# Patient Record
Sex: Female | Born: 1950 | Race: White | Hispanic: No | Marital: Married | Smoking: Never smoker
Health system: Southern US, Community
[De-identification: ages and names within clinical notes are randomized; demographics above are authoritative.]

## PROBLEM LIST (undated history)

## (undated) DIAGNOSIS — K52832 Lymphocytic colitis: Secondary | ICD-10-CM

## (undated) DIAGNOSIS — J45909 Unspecified asthma, uncomplicated: Secondary | ICD-10-CM

## (undated) DIAGNOSIS — R519 Headache, unspecified: Secondary | ICD-10-CM

## (undated) DIAGNOSIS — K589 Irritable bowel syndrome without diarrhea: Secondary | ICD-10-CM

## (undated) DIAGNOSIS — D649 Anemia, unspecified: Secondary | ICD-10-CM

## (undated) DIAGNOSIS — I480 Paroxysmal atrial fibrillation: Secondary | ICD-10-CM

## (undated) DIAGNOSIS — S8991XA Unspecified injury of right lower leg, initial encounter: Secondary | ICD-10-CM

## (undated) DIAGNOSIS — R112 Nausea with vomiting, unspecified: Secondary | ICD-10-CM

## (undated) DIAGNOSIS — J302 Other seasonal allergic rhinitis: Secondary | ICD-10-CM

## (undated) DIAGNOSIS — R51 Headache: Secondary | ICD-10-CM

## (undated) DIAGNOSIS — H269 Unspecified cataract: Secondary | ICD-10-CM

## (undated) DIAGNOSIS — M797 Fibromyalgia: Secondary | ICD-10-CM

## (undated) DIAGNOSIS — Z87442 Personal history of urinary calculi: Secondary | ICD-10-CM

## (undated) DIAGNOSIS — M1990 Unspecified osteoarthritis, unspecified site: Secondary | ICD-10-CM

## (undated) DIAGNOSIS — I2510 Atherosclerotic heart disease of native coronary artery without angina pectoris: Secondary | ICD-10-CM

## (undated) DIAGNOSIS — G8929 Other chronic pain: Secondary | ICD-10-CM

## (undated) DIAGNOSIS — K581 Irritable bowel syndrome with constipation: Secondary | ICD-10-CM

## (undated) DIAGNOSIS — T4145XA Adverse effect of unspecified anesthetic, initial encounter: Secondary | ICD-10-CM

## (undated) DIAGNOSIS — IMO0002 Reserved for concepts with insufficient information to code with codable children: Secondary | ICD-10-CM

## (undated) DIAGNOSIS — T8859XA Other complications of anesthesia, initial encounter: Secondary | ICD-10-CM

## (undated) DIAGNOSIS — R079 Chest pain, unspecified: Secondary | ICD-10-CM

## (undated) DIAGNOSIS — I499 Cardiac arrhythmia, unspecified: Secondary | ICD-10-CM

## (undated) DIAGNOSIS — K5790 Diverticulosis of intestine, part unspecified, without perforation or abscess without bleeding: Secondary | ICD-10-CM

## (undated) DIAGNOSIS — E785 Hyperlipidemia, unspecified: Secondary | ICD-10-CM

## (undated) DIAGNOSIS — I609 Nontraumatic subarachnoid hemorrhage, unspecified: Secondary | ICD-10-CM

## (undated) DIAGNOSIS — L309 Dermatitis, unspecified: Secondary | ICD-10-CM

## (undated) DIAGNOSIS — T7840XA Allergy, unspecified, initial encounter: Secondary | ICD-10-CM

## (undated) DIAGNOSIS — I728 Aneurysm of other specified arteries: Secondary | ICD-10-CM

## (undated) DIAGNOSIS — N83209 Unspecified ovarian cyst, unspecified side: Secondary | ICD-10-CM

## (undated) DIAGNOSIS — N189 Chronic kidney disease, unspecified: Secondary | ICD-10-CM

## (undated) DIAGNOSIS — Z9889 Other specified postprocedural states: Secondary | ICD-10-CM

## (undated) HISTORY — PX: BLADDER SUSPENSION: SHX72

## (undated) HISTORY — DX: Reserved for concepts with insufficient information to code with codable children: IMO0002

## (undated) HISTORY — DX: Headache: R51

## (undated) HISTORY — DX: Chest pain, unspecified: R07.9

## (undated) HISTORY — PX: OTHER SURGICAL HISTORY: SHX169

## (undated) HISTORY — PX: KNEE SURGERY: SHX244

## (undated) HISTORY — DX: Irritable bowel syndrome, unspecified: K58.9

## (undated) HISTORY — DX: Unspecified asthma, uncomplicated: J45.909

## (undated) HISTORY — DX: Unspecified osteoarthritis, unspecified site: M19.90

## (undated) HISTORY — DX: Lymphocytic colitis: K52.832

## (undated) HISTORY — PX: VAGINAL PROLAPSE REPAIR: SHX830

## (undated) HISTORY — DX: Fibromyalgia: M79.7

## (undated) HISTORY — DX: Headache, unspecified: R51.9

## (undated) HISTORY — DX: Anemia, unspecified: D64.9

## (undated) HISTORY — DX: Other seasonal allergic rhinitis: J30.2

## (undated) HISTORY — DX: Allergy, unspecified, initial encounter: T78.40XA

## (undated) HISTORY — DX: Unspecified cataract: H26.9

## (undated) HISTORY — DX: Unspecified ovarian cyst, unspecified side: N83.209

## (undated) HISTORY — DX: Paroxysmal atrial fibrillation: I48.0

## (undated) HISTORY — DX: Chronic kidney disease, unspecified: N18.9

## (undated) HISTORY — DX: Dermatitis, unspecified: L30.9

## (undated) HISTORY — PX: DILATION AND CURETTAGE OF UTERUS: SHX78

## (undated) HISTORY — DX: Other chronic pain: G89.29

## (undated) HISTORY — DX: Hyperlipidemia, unspecified: E78.5

## (undated) HISTORY — DX: Irritable bowel syndrome with constipation: K58.1

## (undated) HISTORY — PX: CATARACT EXTRACTION: SUR2

## (undated) HISTORY — PX: TOTAL ABDOMINAL HYSTERECTOMY: SHX209

## (undated) HISTORY — DX: Diverticulosis of intestine, part unspecified, without perforation or abscess without bleeding: K57.90

---

## 1998-12-23 ENCOUNTER — Other Ambulatory Visit: Admission: RE | Admit: 1998-12-23 | Discharge: 1998-12-23 | Payer: Self-pay | Admitting: Obstetrics and Gynecology

## 1999-03-13 ENCOUNTER — Encounter: Payer: Self-pay | Admitting: Internal Medicine

## 1999-03-13 ENCOUNTER — Ambulatory Visit (HOSPITAL_COMMUNITY): Admission: RE | Admit: 1999-03-13 | Discharge: 1999-03-13 | Payer: Self-pay | Admitting: Internal Medicine

## 2000-02-05 ENCOUNTER — Other Ambulatory Visit: Admission: RE | Admit: 2000-02-05 | Discharge: 2000-02-05 | Payer: Self-pay | Admitting: Obstetrics and Gynecology

## 2001-02-05 ENCOUNTER — Other Ambulatory Visit: Admission: RE | Admit: 2001-02-05 | Discharge: 2001-02-05 | Payer: Self-pay | Admitting: Obstetrics and Gynecology

## 2001-08-11 ENCOUNTER — Encounter: Admission: RE | Admit: 2001-08-11 | Discharge: 2001-08-11 | Payer: Self-pay | Admitting: Urology

## 2001-08-11 ENCOUNTER — Encounter: Payer: Self-pay | Admitting: Urology

## 2001-09-29 ENCOUNTER — Encounter (INDEPENDENT_AMBULATORY_CARE_PROVIDER_SITE_OTHER): Payer: Self-pay | Admitting: Specialist

## 2001-09-29 ENCOUNTER — Observation Stay (HOSPITAL_COMMUNITY): Admission: RE | Admit: 2001-09-29 | Discharge: 2001-09-30 | Payer: Self-pay | Admitting: Obstetrics and Gynecology

## 2001-12-04 ENCOUNTER — Encounter: Payer: Self-pay | Admitting: Urology

## 2001-12-04 ENCOUNTER — Encounter: Admission: RE | Admit: 2001-12-04 | Discharge: 2001-12-04 | Payer: Self-pay | Admitting: Urology

## 2002-02-10 ENCOUNTER — Other Ambulatory Visit: Admission: RE | Admit: 2002-02-10 | Discharge: 2002-02-10 | Payer: Self-pay | Admitting: Obstetrics and Gynecology

## 2003-02-15 ENCOUNTER — Other Ambulatory Visit: Admission: RE | Admit: 2003-02-15 | Discharge: 2003-02-15 | Payer: Self-pay | Admitting: Obstetrics and Gynecology

## 2003-08-24 ENCOUNTER — Encounter: Admission: RE | Admit: 2003-08-24 | Discharge: 2003-08-24 | Payer: Self-pay | Admitting: Sports Medicine

## 2003-08-24 ENCOUNTER — Encounter: Payer: Self-pay | Admitting: Sports Medicine

## 2004-02-21 ENCOUNTER — Other Ambulatory Visit: Admission: RE | Admit: 2004-02-21 | Discharge: 2004-02-21 | Payer: Self-pay | Admitting: Obstetrics and Gynecology

## 2005-02-23 ENCOUNTER — Other Ambulatory Visit: Admission: RE | Admit: 2005-02-23 | Discharge: 2005-02-23 | Payer: Self-pay | Admitting: Obstetrics and Gynecology

## 2005-04-10 ENCOUNTER — Ambulatory Visit: Payer: Self-pay | Admitting: Internal Medicine

## 2005-04-11 ENCOUNTER — Ambulatory Visit: Payer: Self-pay | Admitting: Internal Medicine

## 2005-05-13 ENCOUNTER — Emergency Department (HOSPITAL_COMMUNITY): Admission: AD | Admit: 2005-05-13 | Discharge: 2005-05-13 | Payer: Self-pay | Admitting: Family Medicine

## 2005-09-19 ENCOUNTER — Ambulatory Visit (HOSPITAL_BASED_OUTPATIENT_CLINIC_OR_DEPARTMENT_OTHER): Admission: RE | Admit: 2005-09-19 | Discharge: 2005-09-19 | Payer: Self-pay | Admitting: Urology

## 2005-09-19 ENCOUNTER — Encounter (INDEPENDENT_AMBULATORY_CARE_PROVIDER_SITE_OTHER): Payer: Self-pay | Admitting: Specialist

## 2005-09-19 ENCOUNTER — Ambulatory Visit (HOSPITAL_COMMUNITY): Admission: RE | Admit: 2005-09-19 | Discharge: 2005-09-19 | Payer: Self-pay | Admitting: Urology

## 2005-10-14 ENCOUNTER — Emergency Department (HOSPITAL_COMMUNITY): Admission: AD | Admit: 2005-10-14 | Discharge: 2005-10-14 | Payer: Self-pay | Admitting: Family Medicine

## 2009-01-06 ENCOUNTER — Encounter: Admission: RE | Admit: 2009-01-06 | Discharge: 2009-01-06 | Payer: Self-pay | Admitting: Sports Medicine

## 2009-01-26 ENCOUNTER — Encounter: Admission: RE | Admit: 2009-01-26 | Discharge: 2009-01-26 | Payer: Self-pay | Admitting: Sports Medicine

## 2009-11-15 ENCOUNTER — Encounter: Admission: RE | Admit: 2009-11-15 | Discharge: 2009-11-15 | Payer: Self-pay | Admitting: Neurosurgery

## 2009-11-19 HISTORY — PX: OTHER SURGICAL HISTORY: SHX169

## 2009-11-29 ENCOUNTER — Encounter: Admission: RE | Admit: 2009-11-29 | Discharge: 2009-11-29 | Payer: Self-pay | Admitting: Neurosurgery

## 2009-12-02 ENCOUNTER — Ambulatory Visit: Payer: Self-pay | Admitting: Vascular Surgery

## 2009-12-16 ENCOUNTER — Ambulatory Visit: Payer: Self-pay | Admitting: Vascular Surgery

## 2010-01-09 ENCOUNTER — Encounter: Payer: Self-pay | Admitting: Vascular Surgery

## 2010-01-09 ENCOUNTER — Ambulatory Visit: Payer: Self-pay | Admitting: Vascular Surgery

## 2010-01-09 ENCOUNTER — Inpatient Hospital Stay (HOSPITAL_COMMUNITY): Admission: RE | Admit: 2010-01-09 | Discharge: 2010-01-13 | Payer: Self-pay | Admitting: Vascular Surgery

## 2010-01-13 ENCOUNTER — Ambulatory Visit: Payer: Self-pay | Admitting: Vascular Surgery

## 2010-02-03 ENCOUNTER — Ambulatory Visit: Payer: Self-pay | Admitting: Vascular Surgery

## 2010-02-27 ENCOUNTER — Encounter (INDEPENDENT_AMBULATORY_CARE_PROVIDER_SITE_OTHER): Payer: Self-pay | Admitting: *Deleted

## 2010-03-27 ENCOUNTER — Ambulatory Visit: Payer: Self-pay | Admitting: Vascular Surgery

## 2010-04-14 ENCOUNTER — Encounter: Payer: Self-pay | Admitting: Internal Medicine

## 2010-04-14 ENCOUNTER — Ambulatory Visit: Payer: Self-pay | Admitting: Vascular Surgery

## 2010-08-03 ENCOUNTER — Encounter (INDEPENDENT_AMBULATORY_CARE_PROVIDER_SITE_OTHER): Payer: Self-pay | Admitting: *Deleted

## 2010-08-07 ENCOUNTER — Ambulatory Visit: Payer: Self-pay | Admitting: Internal Medicine

## 2010-10-18 ENCOUNTER — Ambulatory Visit: Payer: Self-pay | Admitting: Internal Medicine

## 2010-12-19 NOTE — Miscellaneous (Signed)
Summary: [facility] PV/prep  Clinical Lists Changes  Medications: Added new medication of MIRALAX   POWD (POLYETHYLENE GLYCOL 3350) As per prep  instructions. - Signed Added new medication of DULCOLAX 5 MG  TBEC (BISACODYL) Day before procedure take 2 at 3pm and 2 at 8pm. - Signed Added new medication of REGLAN 10 MG  TABS (METOCLOPRAMIDE HCL) As per prep instructions. - Signed Rx of MIRALAX   POWD (POLYETHYLENE GLYCOL 3350) As per prep  instructions.;  #255gm x 0;  Signed;  Entered by: Doristine Church RN II;  Authorized by: Hart Carwin MD;  Method used: Electronically to [facility] [address] [place], [place]  [address], Ph: [phone], Fax: [phone] Rx of DULCOLAX 5 MG  TBEC (BISACODYL) Day before procedure take 2 at 3pm and 2 at 8pm.;  #4 x 0;  Signed;  Entered by: Doristine Church RN II;  Authorized by: Hart Carwin MD;  Method used: Electronically to [facility]*, [address], [place], [place]  [address], Ph: [phone], Fax: [phone] Rx of REGLAN 10 MG  TABS (METOCLOPRAMIDE HCL) As per prep instructions.;  #2 x 0;  Signed;  Entered by: Doristine Church RN II;  Authorized by: Hart Carwin MD;  Method used: Electronically to [facility]*, [address] [place], [place]  [address], Ph: [phone], Fax: [phone] Allergies: Added new allergy or adverse reaction of PCN Added new allergy or adverse reaction of CODEINE Added new allergy or adverse reaction of MORPHINE Added new allergy or adverse reaction of TALWIN Observations: Added new observation of ALLERGY REV: Done (08/07/2010 14:21) Added new observation of NKA: F (08/07/2010 14:21)    Prescriptions: REGLAN 10 MG  TABS (METOCLOPRAMIDE HCL) As per prep instructions.  #2 x 0   Entered by:   Doristine Church RN II   Authorized by:   Hart Carwin MD   Signed by:   Doristine Church RN II on 08/07/2010   Method used:   Electronically to        [facility]*  (retail)       [address]       [place], [place]  [address]       Ph: [phone]       Fax: [phone]   RxID:   [id number] DULCOLAX 5 MG  TBEC (BISACODYL) Day before procedure take 2 at 3pm and 2 at 8pm.  #4 x 0   Entered by:   Doristine Church RN II   Authorized by:   Hart Carwin MD   Signed by:   Doristine Church RN II on 08/07/2010   Method used:   Electronically to        [facility]* (retail)       [address]       [place], [place]  [address]       Ph: [phone]       Fax: [phone]   RxID:   [id number] MIRALAX   POWD (POLYETHYLENE GLYCOL 3350) As per prep  instructions.  #255gm x 0   Entered by:   Doristine Church RN II   Authorized by:   Hart Carwin MD   Signed by:   Doristine Church RN II on 08/07/2010   Method used:   Electronically to        [facility]* (retail)       [address]       [place], [place]  [address]  Ph: [phone]       Fax: [phone]   RxID:   [id number]

## 2010-12-19 NOTE — Procedures (Signed)
Summary: Colonoscopy  Patient: Kara Ramirez Note: All result statuses are Final unless otherwise noted.  Tests: (1) Colonoscopy (COL)   COL Colonoscopy           DONE     [facility]     [address]     [place], [place]  [address]           COLONOSCOPY PROCEDURE REPORT           PATIENT:  Kara Ramirez, Kara Ramirez  MR#:  [id number]     BIRTHDATE:  Apr 29, 1951, 59 yrs. old  GENDER:  female     ENDOSCOPIST:  Hedwig Morton. Juanda Chance, MD     REF. BY:  Herb Grays, M.D.     PROCEDURE DATE:  10/18/2010     PROCEDURE:  Colonoscopy 19147     ASA CLASS:  Class I     INDICATIONS:  family history of colon cancer     MEDICATIONS:   Versed 8 mg, Fentanyl 75 mcg           DESCRIPTION OF PROCEDURE:   After the risks benefits and     alternatives of the procedure were thoroughly explained, informed     consent was obtained.  Digital rectal exam was performed and     revealed no rectal masses.   The LB [id number]-[id number] endoscope     was introduced through the anus and advanced to the cecum, which     was identified by both the appendix and ileocecal valve, without     limitations.  The quality of the prep was good, using MiraLax.     The instrument was then slowly withdrawn as the colon was fully     examined.     <<PROCEDUREIMAGES>>           FINDINGS:  No polyps or cancers were seen (see image1, image2,     image3, image4, image5, image6, and image7).   Retroflexed views     in the rectum revealed no abnormalities.    The scope was then     withdrawn from the patient and the procedure completed.           COMPLICATIONS:  None     ENDOSCOPIC IMPRESSION:     1) No polyps or cancers     2) Normal colonoscopy     RECOMMENDATIONS:     1) high fiber diet     REPEAT EXAM:  In 5 year(s) for.           ______________________________     Hedwig Morton. Juanda Chance, MD           CC:           n.     eSIGNED:   Hedwig Morton. Kashayla Ungerer at 10/18/2010 10:16 AM           Mariah Milling, [phone]  Note: An exclamation  mark (!) indicates a result that was not dispersed into the flowsheet. Document Creation Date: 10/18/2010 10:16 AM _______________________________________________________________________  (1) Order result status: Final Collection or observation date-time: 10/18/2010 10:08 Requested date-time:  Receipt date-time:  Reported date-time:  Referring Physician:   Ordering Physician: Lina Sar [phone]) Specimen Source:  Source: Launa Grill Order Number: [id number] Lab site:   Appended Document: Colonoscopy    Clinical Lists Changes  Observations: Added new observation of COLONNXTDUE: 09/2015 (10/18/2010 12:39)

## 2010-12-19 NOTE — Letter (Signed)
Summary: [facility]   Imported By: Lester Staunton 05/04/2010 10:39:17  _____________________________________________________________________  External Attachment:    Type:   Image     Comment:   External Document

## 2010-12-19 NOTE — Letter (Signed)
Summary: [facility] Instructions  [facility]  [address] [place], [place] [address]   Phone: [phone]  Fax: [phone]       Kara Ramirez    [birth year]    MRN: [id number]       Procedure Day /Date:  08/24/10  Thursday     Arrival Time:  8:30am     Procedure Time:  9:30am     Location of Procedure:                    _x _  [facility] (4th Floor)    PREPARATION FOR COLONOSCOPY WITH MIRALAX  Starting 5 days prior to your procedure _10/1/11 _ do not eat nuts, seeds, popcorn, corn, beans, peas,  salads, or any raw vegetables.  Do not take any fiber supplements (e.g. Metamucil, Citrucel, and Benefiber). ____________________________________________________________________________________________________   THE DAY BEFORE YOUR PROCEDURE         DATE: 08/23/10   Wednesday  1   Drink clear liquids the entire day-NO SOLID FOOD  2   Do not drink anything colored red or purple.  Avoid juices with pulp.  No orange juice.  3   Drink at least 64 oz. (8 glasses) of fluid/clear liquids during the day to prevent dehydration and help the prep work efficiently.  CLEAR LIQUIDS INCLUDE: Water Jello Ice Popsicles Tea (sugar ok, no milk/cream) Powdered fruit flavored drinks Coffee (sugar ok, no milk/cream) Gatorade Juice: apple, white grape, white cranberry  Lemonade Clear bullion, consomm, broth Carbonated beverages (any kind) Strained chicken noodle soup Hard Candy  4   Mix the entire bottle of Miralax with 64 oz. of Gatorade/Powerade in the morning and put in the refrigerator to chill.  5   At 3:00 pm take 2 Dulcolax/Bisacodyl tablets.  6   At 4:30 pm take one Reglan/Metoclopramide tablet.  7  Starting at 5:00 pm drink one 8 oz glass of the Miralax mixture every 15-20 minutes until you have finished drinking the entire 64 oz.  You should finish drinking prep around 7:30 or 8:00 pm.  8   If you are nauseated, you may take the 2nd Reglan/Metoclopramide  tablet at 6:30 pm.        9    At 8:00 pm take 2 more DULCOLAX/Bisacodyl tablets.     THE DAY OF YOUR PROCEDURE      DATE:    08/24/10  DAY:  Thursday  You may drink clear liquids until  7:30am  (2 HOURS BEFORE PROCEDURE).   MEDICATION INSTRUCTIONS  Unless otherwise instructed, you should take regular prescription medications with a small sip of water as early as possible the morning of your procedure.       OTHER INSTRUCTIONS  You will need a responsible adult at least 61 years of age to accompany you and drive you home.   This person must remain in the waiting room during your procedure.  Wear loose fitting clothing that is easily removed.  Leave jewelry and other valuables at home.  However, you may wish to bring a book to read or an iPod/MP3 player to listen to music as you wait for your procedure to start.  Remove all body piercing jewelry and leave at home.  Total time from sign-in until discharge is approximately 2-3 hours.  You should go home directly after your procedure and rest.  You can resume normal activities the day after your procedure.  The day of your procedure you should not:  Drive   Make legal decisions   Operate machinery   Drink alcohol   Return to work  You will receive specific instructions about eating, activities and medications before you leave.   The above instructions have been reviewed and explained to me by   Doristine Church RN II  August 07, 2010 3:20 PM _______________________    I fully understand and can verbalize these instructions _____________________________ Date _______

## 2010-12-19 NOTE — Letter (Signed)
Summary: Colonoscopy Letter  [facility]  [address] [place], [place] [address]   Phone: [phone]  Fax: [phone]      February 27, 2010 MRN: [id number]   Kara Ramirez [address] [place], [place]  [address]   Dear Kara Ramirez,   According to your medical record, it is time for you to schedule a Colonoscopy. The American Cancer Society recommends this procedure as a method to detect early colon cancer. Patients with a family history of colon cancer, or a personal history of colon polyps or inflammatory bowel disease are at increased risk.  This letter has beeen generated based on the recommendations made at the time of your procedure. If you feel that in your particular situation this may no longer apply, please contact our office.  Please call our office at [phone] to schedule this appointment or to update your records at your earliest convenience.  Thank you for cooperating with [place] to provide you with the very best care possible.   Sincerely,  Hedwig Morton. Juanda Chance, M.D.  [facility] [phone]

## 2011-02-08 LAB — BLOOD GAS, ARTERIAL
Acid-Base Excess: 1.5 mmol/L (ref 0.0–2.0)
Bicarbonate: 24.8 mEq/L — ABNORMAL HIGH (ref 20.0–24.0)
Drawn by: 181601
O2 Content: 0.2 L/min
O2 Saturation: 98.6 %
Patient temperature: 98.6
TCO2: 25.8 mmol/L (ref 0–100)
pCO2 arterial: 33.7 mmHg — ABNORMAL LOW (ref 35.0–45.0)
pH, Arterial: 7.479 — ABNORMAL HIGH (ref 7.350–7.400)
pO2, Arterial: 111 mmHg — ABNORMAL HIGH (ref 80.0–100.0)

## 2011-02-08 LAB — CBC
HCT: 30 % — ABNORMAL LOW (ref 36.0–46.0)
HCT: 30.2 % — ABNORMAL LOW (ref 36.0–46.0)
HCT: 31.2 % — ABNORMAL LOW (ref 36.0–46.0)
HCT: 32.7 % — ABNORMAL LOW (ref 36.0–46.0)
HCT: 38.9 % (ref 36.0–46.0)
Hemoglobin: 10.1 g/dL — ABNORMAL LOW (ref 12.0–15.0)
Hemoglobin: 10.2 g/dL — ABNORMAL LOW (ref 12.0–15.0)
Hemoglobin: 10.6 g/dL — ABNORMAL LOW (ref 12.0–15.0)
Hemoglobin: 11.5 g/dL — ABNORMAL LOW (ref 12.0–15.0)
Hemoglobin: 13.3 g/dL (ref 12.0–15.0)
MCHC: 33.8 g/dL (ref 30.0–36.0)
MCHC: 33.9 g/dL (ref 30.0–36.0)
MCHC: 34 g/dL (ref 30.0–36.0)
MCHC: 34.3 g/dL (ref 30.0–36.0)
MCHC: 35.1 g/dL (ref 30.0–36.0)
MCV: 87.6 fL (ref 78.0–100.0)
MCV: 88.3 fL (ref 78.0–100.0)
MCV: 88.7 fL (ref 78.0–100.0)
MCV: 89.1 fL (ref 78.0–100.0)
MCV: 89.3 fL (ref 78.0–100.0)
Platelets: 147 10*3/uL — ABNORMAL LOW (ref 150–400)
Platelets: 154 10*3/uL (ref 150–400)
Platelets: 157 10*3/uL (ref 150–400)
Platelets: 190 10*3/uL (ref 150–400)
Platelets: 222 10*3/uL (ref 150–400)
RBC: 3.35 MIL/uL — ABNORMAL LOW (ref 3.87–5.11)
RBC: 3.4 MIL/uL — ABNORMAL LOW (ref 3.87–5.11)
RBC: 3.51 MIL/uL — ABNORMAL LOW (ref 3.87–5.11)
RBC: 3.73 MIL/uL — ABNORMAL LOW (ref 3.87–5.11)
RBC: 4.4 MIL/uL (ref 3.87–5.11)
RDW: 12.6 % (ref 11.5–15.5)
RDW: 12.8 % (ref 11.5–15.5)
RDW: 13.1 % (ref 11.5–15.5)
RDW: 13.1 % (ref 11.5–15.5)
RDW: 13.2 % (ref 11.5–15.5)
WBC: 10.6 10*3/uL — ABNORMAL HIGH (ref 4.0–10.5)
WBC: 10.9 10*3/uL — ABNORMAL HIGH (ref 4.0–10.5)
WBC: 3.1 10*3/uL — ABNORMAL LOW (ref 4.0–10.5)
WBC: 7 10*3/uL (ref 4.0–10.5)
WBC: 7.4 10*3/uL (ref 4.0–10.5)

## 2011-02-08 LAB — COMPREHENSIVE METABOLIC PANEL
ALT: 25 U/L (ref 0–35)
ALT: 49 U/L — ABNORMAL HIGH (ref 0–35)
AST: 24 U/L (ref 0–37)
AST: 35 U/L (ref 0–37)
Albumin: 2.9 g/dL — ABNORMAL LOW (ref 3.5–5.2)
Albumin: 4 g/dL (ref 3.5–5.2)
Alkaline Phosphatase: 39 U/L (ref 39–117)
Alkaline Phosphatase: 49 U/L (ref 39–117)
BUN: 20 mg/dL (ref 6–23)
BUN: 9 mg/dL (ref 6–23)
CO2: 24 mEq/L (ref 19–32)
CO2: 25 mEq/L (ref 19–32)
Calcium: 8.3 mg/dL — ABNORMAL LOW (ref 8.4–10.5)
Calcium: 9.9 mg/dL (ref 8.4–10.5)
Chloride: 109 mEq/L (ref 96–112)
Chloride: 99 mEq/L (ref 96–112)
Creatinine, Ser: 0.46 mg/dL (ref 0.4–1.2)
Creatinine, Ser: 0.71 mg/dL (ref 0.4–1.2)
GFR calc Af Amer: >60 mL/min (ref >60)
GFR calc Af Amer: >60 mL/min (ref >60)
GFR calc non Af Amer: >60 mL/min (ref >60)
GFR calc non Af Amer: >60 mL/min (ref >60)
Glucose, Bld: 143 mg/dL — ABNORMAL HIGH (ref 70–99)
Glucose, Bld: 90 mg/dL (ref 70–99)
Potassium: 3.2 mEq/L — ABNORMAL LOW (ref 3.5–5.1)
Potassium: 4.1 mEq/L (ref 3.5–5.1)
Sodium: 129 mEq/L — ABNORMAL LOW (ref 135–145)
Sodium: 141 mEq/L (ref 135–145)
Total Bilirubin: 0.9 mg/dL (ref 0.3–1.2)
Total Bilirubin: 1 mg/dL (ref 0.3–1.2)
Total Protein: 5.3 g/dL — ABNORMAL LOW (ref 6.0–8.3)
Total Protein: 6.5 g/dL (ref 6.0–8.3)

## 2011-02-08 LAB — URINALYSIS, ROUTINE W REFLEX MICROSCOPIC
Bilirubin Urine: NEGATIVE
Glucose, UA: NEGATIVE mg/dL
Hgb urine dipstick: NEGATIVE
Ketones, ur: NEGATIVE mg/dL
Nitrite: NEGATIVE
Protein, ur: NEGATIVE mg/dL
Specific Gravity, Urine: 1.016 (ref 1.005–1.030)
Urobilinogen, UA: 0.2 mg/dL (ref 0.0–1.0)
pH: 7.5 (ref 5.0–8.0)

## 2011-02-08 LAB — PROTIME-INR
INR: 1.05 (ref 0.00–1.49)
INR: 1.23 (ref 0.00–1.49)
Prothrombin Time: 13.6 seconds (ref 11.6–15.2)
Prothrombin Time: 15.4 seconds — ABNORMAL HIGH (ref 11.6–15.2)

## 2011-02-08 LAB — BASIC METABOLIC PANEL
BUN: 12 mg/dL (ref 6–23)
BUN: 3 mg/dL — ABNORMAL LOW (ref 6–23)
BUN: 8 mg/dL (ref 6–23)
CO2: 26 mEq/L (ref 19–32)
CO2: 27 mEq/L (ref 19–32)
CO2: 29 mEq/L (ref 19–32)
Calcium: 8 mg/dL — ABNORMAL LOW (ref 8.4–10.5)
Calcium: 8.3 mg/dL — ABNORMAL LOW (ref 8.4–10.5)
Calcium: 9.1 mg/dL (ref 8.4–10.5)
Chloride: 105 mEq/L (ref 96–112)
Chloride: 107 mEq/L (ref 96–112)
Chloride: 109 mEq/L (ref 96–112)
Creatinine, Ser: 0.51 mg/dL (ref 0.4–1.2)
Creatinine, Ser: 0.56 mg/dL (ref 0.4–1.2)
Creatinine, Ser: 0.6 mg/dL (ref 0.4–1.2)
GFR calc Af Amer: >60 mL/min (ref >60)
GFR calc Af Amer: >60 mL/min (ref >60)
GFR calc Af Amer: >60 mL/min (ref >60)
GFR calc non Af Amer: >60 mL/min (ref >60)
GFR calc non Af Amer: >60 mL/min (ref >60)
GFR calc non Af Amer: >60 mL/min (ref >60)
Glucose, Bld: 100 mg/dL — ABNORMAL HIGH (ref 70–99)
Glucose, Bld: 114 mg/dL — ABNORMAL HIGH (ref 70–99)
Glucose, Bld: 120 mg/dL — ABNORMAL HIGH (ref 70–99)
Potassium: 3.6 mEq/L (ref 3.5–5.1)
Potassium: 3.6 mEq/L (ref 3.5–5.1)
Potassium: 3.7 mEq/L (ref 3.5–5.1)
Sodium: 139 mEq/L (ref 135–145)
Sodium: 140 mEq/L (ref 135–145)
Sodium: 140 mEq/L (ref 135–145)

## 2011-02-08 LAB — GLUCOSE, CAPILLARY
Glucose-Capillary: 112 mg/dL — ABNORMAL HIGH (ref 70–99)
Glucose-Capillary: 121 mg/dL — ABNORMAL HIGH (ref 70–99)
Glucose-Capillary: 133 mg/dL — ABNORMAL HIGH (ref 70–99)
Glucose-Capillary: 145 mg/dL — ABNORMAL HIGH (ref 70–99)

## 2011-02-08 LAB — TYPE AND SCREEN
ABO/RH(D): A POS
Antibody Screen: NEGATIVE

## 2011-02-08 LAB — APTT
aPTT: 31 seconds (ref 24–37)
aPTT: 31 seconds (ref 24–37)

## 2011-02-08 LAB — MAGNESIUM
Magnesium: 1.7 mg/dL (ref 1.5–2.5)
Magnesium: 1.7 mg/dL (ref 1.5–2.5)

## 2011-02-08 LAB — ABO/RH: ABO/RH(D): A POS

## 2011-02-08 LAB — MRSA PCR SCREENING: MRSA by PCR: NEGATIVE

## 2011-02-08 LAB — AMYLASE: Amylase: 46 U/L (ref 0–105)

## 2011-03-05 ENCOUNTER — Other Ambulatory Visit (HOSPITAL_COMMUNITY): Payer: Self-pay | Admitting: Neurosurgery

## 2011-03-05 ENCOUNTER — Encounter (HOSPITAL_COMMUNITY)
Admission: RE | Admit: 2011-03-05 | Discharge: 2011-03-05 | Disposition: A | Discharge status: Home / Self Care | Payer: Worker's Compensation | Source: Ambulatory Visit | Attending: Neurosurgery | Admitting: Neurosurgery

## 2011-03-05 ENCOUNTER — Ambulatory Visit (HOSPITAL_COMMUNITY)
Admission: RE | Admit: 2011-03-05 | Discharge: 2011-03-05 | Disposition: A | Discharge status: Home / Self Care | Payer: Worker's Compensation | Source: Ambulatory Visit | Attending: Neurosurgery | Admitting: Neurosurgery

## 2011-03-05 DIAGNOSIS — M419 Scoliosis, unspecified: Secondary | ICD-10-CM

## 2011-03-05 DIAGNOSIS — Z01810 Encounter for preprocedural cardiovascular examination: Secondary | ICD-10-CM | POA: Insufficient documentation

## 2011-03-05 DIAGNOSIS — M4120 Other idiopathic scoliosis, site unspecified: Secondary | ICD-10-CM | POA: Insufficient documentation

## 2011-03-05 DIAGNOSIS — Z01812 Encounter for preprocedural laboratory examination: Secondary | ICD-10-CM | POA: Insufficient documentation

## 2011-03-05 DIAGNOSIS — Z01818 Encounter for other preprocedural examination: Secondary | ICD-10-CM | POA: Insufficient documentation

## 2011-03-05 LAB — BASIC METABOLIC PANEL
BUN: 19 mg/dL (ref 6–23)
CO2: 28 mEq/L (ref 19–32)
Calcium: 9.8 mg/dL (ref 8.4–10.5)
Chloride: 104 mEq/L (ref 96–112)
Creatinine, Ser: 0.73 mg/dL (ref 0.4–1.2)
GFR calc Af Amer: >60 mL/min (ref >60)
GFR calc non Af Amer: >60 mL/min (ref >60)
Glucose, Bld: 86 mg/dL (ref 70–99)
Potassium: 4.2 mEq/L (ref 3.5–5.1)
Sodium: 139 mEq/L (ref 135–145)

## 2011-03-05 LAB — CBC
HCT: 39 % (ref 36.0–46.0)
Hemoglobin: 12.7 g/dL (ref 12.0–15.0)
MCH: 28.3 pg (ref 26.0–34.0)
MCHC: 32.6 g/dL (ref 30.0–36.0)
MCV: 87.1 fL (ref 78.0–100.0)
Platelets: 212 10*3/uL (ref 150–400)
RBC: 4.48 MIL/uL (ref 3.87–5.11)
RDW: 13.4 % (ref 11.5–15.5)
WBC: 6.1 10*3/uL (ref 4.0–10.5)

## 2011-03-05 LAB — DIFFERENTIAL
Basophils Absolute: 0 10*3/uL (ref 0.0–0.1)
Basophils Relative: 1 % (ref 0–1)
Eosinophils Absolute: 0.2 10*3/uL (ref 0.0–0.7)
Eosinophils Relative: 3 % (ref 0–5)
Lymphocytes Relative: 31 % (ref 12–46)
Lymphs Abs: 1.9 10*3/uL (ref 0.7–4.0)
Monocytes Absolute: 0.4 10*3/uL (ref 0.1–1.0)
Monocytes Relative: 6 % (ref 3–12)
Neutro Abs: 3.6 10*3/uL (ref 1.7–7.7)
Neutrophils Relative %: 60 % (ref 43–77)

## 2011-03-05 LAB — SURGICAL PCR SCREEN
MRSA, PCR: NEGATIVE
Staphylococcus aureus: NEGATIVE

## 2011-03-13 ENCOUNTER — Inpatient Hospital Stay (HOSPITAL_COMMUNITY): Payer: Worker's Compensation

## 2011-03-13 ENCOUNTER — Inpatient Hospital Stay (HOSPITAL_COMMUNITY)
Admission: RE | Admit: 2011-03-13 | Discharge: 2011-03-19 | DRG: 457 | Disposition: A | Discharge status: Home Health | Payer: Worker's Compensation | Source: Ambulatory Visit | Attending: Neurosurgery | Admitting: Neurosurgery

## 2011-03-13 DIAGNOSIS — D62 Acute posthemorrhagic anemia: Secondary | ICD-10-CM | POA: Diagnosis not present

## 2011-03-13 DIAGNOSIS — Z91040 Latex allergy status: Secondary | ICD-10-CM

## 2011-03-13 DIAGNOSIS — Z880 Allergy status to penicillin: Secondary | ICD-10-CM

## 2011-03-13 DIAGNOSIS — M4310 Spondylolisthesis, site unspecified: Secondary | ICD-10-CM | POA: Diagnosis present

## 2011-03-13 DIAGNOSIS — M4120 Other idiopathic scoliosis, site unspecified: Principal | ICD-10-CM | POA: Diagnosis present

## 2011-03-13 DIAGNOSIS — IMO0001 Reserved for inherently not codable concepts without codable children: Secondary | ICD-10-CM | POA: Diagnosis present

## 2011-03-13 DIAGNOSIS — K589 Irritable bowel syndrome without diarrhea: Secondary | ICD-10-CM | POA: Diagnosis present

## 2011-03-13 DIAGNOSIS — Z01812 Encounter for preprocedural laboratory examination: Secondary | ICD-10-CM

## 2011-03-13 HISTORY — PX: LUMBAR DISC SURGERY: SHX700

## 2011-03-13 LAB — POCT I-STAT 4, (NA,K, GLUC, HGB,HCT)
Glucose, Bld: 144 mg/dL — ABNORMAL HIGH (ref 70–99)
Glucose, Bld: 163 mg/dL — ABNORMAL HIGH (ref 70–99)
Glucose, Bld: 158 mg/dL — ABNORMAL HIGH (ref 70–99)
HCT: 23 % — ABNORMAL LOW (ref 36.0–46.0)
HCT: 31 % — ABNORMAL LOW (ref 36.0–46.0)
HCT: 33 % — ABNORMAL LOW (ref 36.0–46.0)
Hemoglobin: 7.8 g/dL — ABNORMAL LOW (ref 12.0–15.0)
Hemoglobin: 10.5 g/dL — ABNORMAL LOW (ref 12.0–15.0)
Hemoglobin: 11.2 g/dL — ABNORMAL LOW (ref 12.0–15.0)
Potassium: 3.6 mEq/L (ref 3.5–5.1)
Potassium: 3.8 mEq/L (ref 3.5–5.1)
Potassium: 3.9 mEq/L (ref 3.5–5.1)
Sodium: 137 mEq/L (ref 135–145)
Sodium: 140 mEq/L (ref 135–145)
Sodium: 138 mEq/L (ref 135–145)

## 2011-03-13 LAB — CBC
HCT: 30.6 % — ABNORMAL LOW (ref 36.0–46.0)
Hemoglobin: 10.5 g/dL — ABNORMAL LOW (ref 12.0–15.0)
MCH: 28.8 pg (ref 26.0–34.0)
MCHC: 34.3 g/dL (ref 30.0–36.0)
MCV: 84.1 fL (ref 78.0–100.0)
Platelets: 108 10*3/uL — ABNORMAL LOW (ref 150–400)
RBC: 3.64 MIL/uL — ABNORMAL LOW (ref 3.87–5.11)
RDW: 13.5 % (ref 11.5–15.5)
WBC: 13.9 10*3/uL — ABNORMAL HIGH (ref 4.0–10.5)

## 2011-03-13 LAB — BASIC METABOLIC PANEL
BUN: 12 mg/dL (ref 6–23)
CO2: 22 mEq/L (ref 19–32)
Calcium: 7.2 mg/dL — ABNORMAL LOW (ref 8.4–10.5)
Chloride: 109 mEq/L (ref 96–112)
Creatinine, Ser: 0.58 mg/dL (ref 0.4–1.2)
GFR calc Af Amer: >60 mL/min (ref >60)
GFR calc non Af Amer: >60 mL/min (ref >60)
Glucose, Bld: 190 mg/dL — ABNORMAL HIGH (ref 70–99)
Potassium: 3.8 mEq/L (ref 3.5–5.1)
Sodium: 134 mEq/L — ABNORMAL LOW (ref 135–145)

## 2011-03-14 LAB — CBC
HCT: 21.3 % — ABNORMAL LOW (ref 36.0–46.0)
HCT: 29.3 % — ABNORMAL LOW (ref 36.0–46.0)
Hemoglobin: 7.5 g/dL — ABNORMAL LOW (ref 12.0–15.0)
Hemoglobin: 10 g/dL — ABNORMAL LOW (ref 12.0–15.0)
MCH: 29.6 pg (ref 26.0–34.0)
MCH: 28.9 pg (ref 26.0–34.0)
MCHC: 35.2 g/dL (ref 30.0–36.0)
MCHC: 34.1 g/dL (ref 30.0–36.0)
MCV: 84.2 fL (ref 78.0–100.0)
MCV: 84.7 fL (ref 78.0–100.0)
Platelets: 86 10*3/uL — ABNORMAL LOW (ref 150–400)
Platelets: 91 10*3/uL — ABNORMAL LOW (ref 150–400)
RBC: 2.53 MIL/uL — ABNORMAL LOW (ref 3.87–5.11)
RBC: 3.46 MIL/uL — ABNORMAL LOW (ref 3.87–5.11)
RDW: 14 % (ref 11.5–15.5)
RDW: 14 % (ref 11.5–15.5)
WBC: 8.8 10*3/uL (ref 4.0–10.5)
WBC: 10.9 10*3/uL — ABNORMAL HIGH (ref 4.0–10.5)

## 2011-03-14 LAB — BASIC METABOLIC PANEL
BUN: 17 mg/dL (ref 6–23)
CO2: 21 mEq/L (ref 19–32)
Calcium: 7.2 mg/dL — ABNORMAL LOW (ref 8.4–10.5)
Chloride: 114 mEq/L — ABNORMAL HIGH (ref 96–112)
Creatinine, Ser: 0.67 mg/dL (ref 0.4–1.2)
GFR calc Af Amer: >60 mL/min (ref >60)
GFR calc non Af Amer: >60 mL/min (ref >60)
Glucose, Bld: 132 mg/dL — ABNORMAL HIGH (ref 70–99)
Potassium: 4.1 mEq/L (ref 3.5–5.1)
Sodium: 138 mEq/L (ref 135–145)

## 2011-03-14 LAB — TYPE AND SCREEN
ABO/RH(D): A POS
Antibody Screen: NEGATIVE

## 2011-03-15 LAB — TYPE AND SCREEN
ABO/RH(D): A POS
Antibody Screen: NEGATIVE
Unit division: 0
Unit division: 0
Unit division: 0
Unit division: 0

## 2011-03-16 LAB — CBC
HCT: 26.7 % — ABNORMAL LOW (ref 36.0–46.0)
Hemoglobin: 9 g/dL — ABNORMAL LOW (ref 12.0–15.0)
MCH: 29.1 pg (ref 26.0–34.0)
MCHC: 33.7 g/dL (ref 30.0–36.0)
MCV: 86.4 fL (ref 78.0–100.0)
Platelets: 82 10*3/uL — ABNORMAL LOW (ref 150–400)
RBC: 3.09 MIL/uL — ABNORMAL LOW (ref 3.87–5.11)
RDW: 14.1 % (ref 11.5–15.5)
WBC: 8.1 10*3/uL (ref 4.0–10.5)

## 2011-03-16 LAB — BASIC METABOLIC PANEL
BUN: 7 mg/dL (ref 6–23)
CO2: 27 mEq/L (ref 19–32)
Calcium: 8.1 mg/dL — ABNORMAL LOW (ref 8.4–10.5)
Chloride: 108 mEq/L (ref 96–112)
Creatinine, Ser: 0.56 mg/dL (ref 0.4–1.2)
GFR calc Af Amer: >60 mL/min (ref >60)
GFR calc non Af Amer: >60 mL/min (ref >60)
Glucose, Bld: 97 mg/dL (ref 70–99)
Potassium: 4 mEq/L (ref 3.5–5.1)
Sodium: 140 mEq/L (ref 135–145)

## 2011-04-03 NOTE — Assessment & Plan Note (Signed)
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  This was on 11/08/2010.   She looks quite good today.  She has the typical amount of diminished  stamina and slow return of bowel function.  She does not feel that she  has lost a great deal of weight since the procedure.   Her incision looks quite good.  She has normoactive bowel sounds and  normal femoral pulses.  She does have some paresthesias around the level  of her skin incision from skin nerves.   She will continue her usual activity aside from heavy lifting.  We plan  to see her again in 2 months for continued follow-up.     Larina Earthly, M.D.  Electronically Signed   TFE/MEDQ  D:  02/03/2010  T:  02/06/2010  Job:  [id number]   cc:   Tammy R. Collins Scotland, M.D.  Wendi Snipes, MD

## 2011-04-03 NOTE — Assessment & Plan Note (Signed)
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  I had seen her for initial  evaluation in consultation on December 02, 2009.  I discussed the  significance of this asymptomatic finding with the patient and her  husband at time and, in fact, when she returned for further discussion,  I had also told her that with the unusual nature of this that I would  prefer to review this with several of my partners for consensus on the  appropriate treatment.  I did review her CT scan and attempted to find  old scans from 2001-1002 but the report does not suggest any aneurysm at  that time, but the actual films are unavailable.   Her physical exam is unchanged, as is her history.  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  With her young age of 35 and no cardiac  morbidity, I would recommend that we proceed with elective repair.  I  explained the magnitude of the procedure of upper midline incision and  replacement of the celiac artery from its origin from the aorta to the  hepatic and splenic arteries.  She agrees with the plan for her surgery  and wishes to proceed as soon as possible.  We have scheduled this for  February 21.  She understands this that should require approximately a 5-  7-day hospital admission, understands the slight risk for complications  to include bleeding, GI, cardiac and respiratory difficulties.  We will  proceed on 02/21.     Larina Earthly, M.D.  Electronically Signed   TFE/MEDQ  D:  12/16/2009  T:  12/19/2009  Job:  [id number]
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
## 2011-04-03 NOTE — Assessment & Plan Note (Signed)
OFFICE VISIT   LEI, DOWER  DOB:  1951-04-30                                       04/14/2010  UXNAT#:[id number]   Patient presents today for continued follow-up after her resection of  celiac artery aneurysm on 01/09/2010.  She reports that she is having  some pins-and-needles sensation around her incision.  I explained that  this is due to regrowing of surface nerves at the time of the incision.   Her abdominal wound is well-healed.  She does have no evidence of  abdominal hernia.  She has no tenderness.  Does have sensitivity to the  skin specifically in the upper portion of her incision.  Her femoral  pulses are 2+.  She has normoactive bowel sounds and no bruits present.   I am quite pleased with her progress.  She reports that she is to  undergo repeat colonoscopy in follow-up with Dr. Lina Sar.  I do not  see any contraindication from this.  She also reports some chronic  bloating sensation and will discuss this with Dr. Juanda Chance as well.  She  will see [place] again on an as-needed basis.     Larina Earthly, M.D.  Electronically Signed   TFE/MEDQ  D:  04/14/2010  T:  04/14/2010  Job:  [id number]   cc:   Duke Salvia. Marcelle Overlie, M.D.  Hedwig Morton. Juanda Chance, MD  Tammy R. Collins Scotland, M.D.

## 2011-04-05 ENCOUNTER — Ambulatory Visit
Admission: RE | Admit: 2011-04-05 | Discharge: 2011-04-05 | Disposition: A | Discharge status: Home / Self Care | Payer: Worker's Compensation | Source: Ambulatory Visit | Attending: Neurosurgery | Admitting: Neurosurgery

## 2011-04-05 ENCOUNTER — Other Ambulatory Visit: Payer: Self-pay | Admitting: Neurosurgery

## 2011-04-05 DIAGNOSIS — M4120 Other idiopathic scoliosis, site unspecified: Secondary | ICD-10-CM

## 2011-04-05 DIAGNOSIS — M4310 Spondylolisthesis, site unspecified: Secondary | ICD-10-CM
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
## 2011-04-06 NOTE — Op Note (Signed)
[facility]  Patient:    Kara Ramirez, Kara Ramirez Visit Number: [id number] MRN: [id number]          Service Type: SUR Location: 4W 0457 01 Attending Physician:  Rhina Brackett Dictated by:   Duke Salvia. Marcelle Overlie, M.D. Proc. Date: 09/29/01 Admit Date:  09/29/2001                             Operative Report  PREOPERATIVE DIAGNOSIS:  Symptomatic cystocele and rectocele, stress urinary incontinence.  POSTOPERATIVE DIAGNOSIS:  Symptomatic cystocele and rectocele, stress urinary incontinence.  PROCEDURE:  A&P repair, pubovaginal sling per Dr. Isabel Caprice to be dictated separately.  SURGEON:  Duke Salvia. Marcelle Overlie, M.D.  ANESTHESIA:  General endotracheal.  COMPLICATIONS:  None.  DRAINS:  Suprapubic catheter.  BLOOD LOSS:  150 cc.  DESCRIPTION OF PROCEDURE/FINDINGS:  The patient was taken to operating room after an adequate level of general endotracheal anesthesia was obtained.  With the patients legs in stirrups, the lower abdomen, perineum, and vaginal were prepped and draped in the usual manner for A&P repair under anesthesia.  She had a cystocele and also had a posterior high rectocele noted.  The cuff was identified with Alice clamps, the bladder had been drained previously.  The vaginal mucosa was dissected up to the UV angle.  The perivesical fascia was then separated and the cystocele was reduced.  This was packed and left temporarily until the posterior repair could be completed.  A small triangle of perineal skin was excised.  The posterior mucosa was then dissected up to the cuff in midline.  Sharp and blunt dissection used to reduce the rectocele. No evidence of enterocele was noted.  The perirectal fascia was the plicated in the midline, reducing the rectocele.  A small amount of excess mucosa was trimmed and then plicated in the midline with 2-0 Dexon interrupted sutures. 3-0 Vicryl repeated sutures used on the deep perineal tissue and perineal  skin with good closure.  At this point, Dr. Isabel Caprice performed the pubovagianl sling.  After this was completed, a small amount of anterior mucosa was excised, the Kelly plication sutures had been placed previously reducing the small cystocele, and then the mucosa was plicated in the midline with 2-0 Dexon interrupted sutures. Suprapubic catheter had been placed draining clear urine and one inch vaginal packing was placed.  She tolerated this well and went to recovery room in good condition. Dictated by:   Duke Salvia. Marcelle Overlie, M.D. Attending Physician:  Rhina Brackett DD:  09/29/01 TD:  09/29/01 Job: [id number]

## 2011-04-06 NOTE — Op Note (Signed)
[facility]  Patient:    Kara Ramirez, Kara Ramirez Visit Number: [id number] MRN: [id number]          Service Type: SUR Location: 4W 0457 01 Attending Physician:  Rhina Brackett Dictated by:   Barron Alvine, M.D. Proc. Date: 09/29/01 Admit Date:  09/29/2001   CC:         Duke Salvia. Marcelle Overlie, M.D.   Operative Report  PREOPERATIVE DIAGNOSIS:  Cystocele with stress urinary incontinence.  POSTOPERATIVE DIAGNOSIS:  Cystocele, rectocele, and stress urinary incontinence.  PROCEDURE PERFORMED:  Pubovaginal sling with flexible cystoscopy and suprapubic tube placement.  SURGEON:  Barron Alvine, M.D.  ASSISTANT:  Duke Salvia. Marcelle Overlie, M.D.  ANESTHESIA:  General.  INDICATIONS:  The patient has had some fairly longstanding voiding complaints. She has had urinary urgency, frequency with mild urge incontinence, and some stress incontinence.  She is noted to have at least a grade 2-3 cystocele. For some time now, she has had these voiding complaints with stress incontinence which is felt to be secondary to her cystocele and urethral hypomobility.  Her evaluation had included Gaynell Face test which had demonstrated stress urinary leakage.  She is also a patient of Dr. Richarda Overlie.  He has also noted considerable cystocele.  Since this does appear to be asymptomatic and interfering with her quality of life, she elected to have the procedure performed.  We felt that given the size of her cystocele with concurrent stress urinary incontinence, that it would be best if a pubovaginal sling was done concurrently with the anterior repair.  Dr. Marcelle Overlie agreed with this assessment and has elected to have the patient present for a combined procedure.  Dr. Marcelle Overlie began the procedure and will dictate his portion of the operation independently.  DESCRIPTION OF PROCEDURE:  When we entered the room, the patient was in a moderate lithotomy position with general anesthesia and  quite stable and doing well.  Estimated blood loss was approximately 150-200 cc at that time. Dr. Marcelle Overlie had actually performed both anterior and posterior repairs.  The anterior vaginal mucosa was left open, but the cystocele had been reduced.  We were able to identify the bladder neck without any difficulty.  Fingertip dissection was all that was necessary to enter the retropubic spaces, and these tissues were noted to be markedly attenuated.  No adhesions were appreciated.  No significant bleeding was encountered with this retropubic dissection.  A small suprapubic incision was made right over the pubic symphysis.  The sling itself was made with a piece of cadaveric fascia lata measuring approximately 2.5 cm x 10 cm.  Both ends were anchored with #1 nylon suture.  Utilizing a Foley catheter to drain the bladder, we then placed a finger in the retropubic space, and directly passed a clamp behind the pubic symphysis and out the vaginal incision on the right side.  Direct digital finger control was used throughout the passage.  The nylon sutures were grabbed and brought out the suprapubic incision.  The same thing was done down on the right side. The sling itself was then positioned.  It was patched in an open position with some 3-0 Vicryl suture.  The Foley catheter was then removed, and flexible cystoscopy was performed.  The sling itself appeared to be well-positioned. This was at the bladder neck and most proximal portion of the urethra. Ureteral orifices showed efflux of blue dye bilaterally.  The suprapubic tube was placed with direct visual guidance.  No evidence of bladder injury was appreciated.  Dr. Marcelle Overlie then trimmed the small amount of anterior vaginal mucosa and closed the vaginal incision.  Packing was then applied.  We utilized some Marcaine for the suprapubic incision.  This was then copiously irrigated with antibiotic solution.  The sling itself was tied over  approximately two to two-and-a-half fingers and kept moderately loose.  The subcutaneous tissues were reapproximated with some interrupted Vicryl and the skin was closed with clips.  The patient was brought to the recovery room in stable condition and appeared to tolerate the procedure well without any obvious complications. Dictated by:   Barron Alvine, M.D. Attending Physician:  Rhina Brackett DD:  09/29/01 TD:  09/30/01 Job: [id number]

## 2011-04-06 NOTE — Op Note (Signed)
NAMEANGELISSA, Kara Ramirez               ACCOUNT NO.:  [id number]   MEDICAL RECORD NO.:  [id number]          PATIENT TYPE:  AMB   LOCATION:  NESC                         FACILITY:  [facility]   PHYSICIAN:  Valetta Fuller, M.D.  DATE OF BIRTH:  10/30/1951   DATE OF PROCEDURE:  09/19/2005  DATE OF DISCHARGE:                                 OPERATIVE REPORT   PREOPERATIVE DIAGNOSIS:  1.  Microhematuria.  2.  Positive urine cytology.   POSTOPERATIVE DIAGNOSIS:  1.  Microhematuria.  2.  Positive urine cytology.   PROCEDURE PERFORMED:  Cystoscopy with bladder barbotage for urinary  cytology, bilateral retrograde pyelography, random bladder biopsies x2 with  fulguration.   SURGEON:  Valetta Fuller, M.D.   ANESTHESIA:  General.   INDICATIONS:  Kara Ramirez is a 60 year old female whose been a longstanding  patient in my practice. Her initial problem was one of urinary incontinence  and she did undergo anti-incontinent surgery. She had recently come in with  signs and symptoms of possible urinary tract infection. 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 She was treated empirically for infection  but a culture was not done. It was unclear to me whether the episode of  gross episode hematuria was definitely related to infection or not. When we  saw her several months ago, she had greater than 100 red cells but no pyuria  or bacteria. A stone protocol CT was unremarkable. She did have an NMP-22  cytology which turned out to be positive. Flexible cystoscopy was  unremarkable. We repeated her assessment with a CT with contrast. This  showed no evidence of any obvious pathology. I repeated her cytology which  showed some atypia. We are left with a patient who had one cytology that was  positive, one this showed atypia, but a negative CT scan with intravenous  contrast and also negative flexible cystoscopy in the  office. We felt it  prudent to further evaluate her urinary tract with cystoscopy, biopsy and  retrograde pyelography.   TECHNIQUE AND FINDINGS:  The patient was brought to the operating room where  she had successful induction of general anesthesia. She was placed in  lithotomy position, prepped and draped in the usual manner. Cystoscopy  initially revealed a completely unremarkable bladder. There was certainly no  evidence of bladder tumor and nothing suggestive of carcinoma in situ. She  had some very minimal erythema along the left lateral wall of her bladder.  Orifices appeared to be unremarkable. Utilizing saline, I did a bladder  barbotage which was then sent for cytology. Bilateral retrograde pyelograms  were done. I saw no evidence of any ureteral filling defects and no evidence  of obstruction or dilation and nothing really to suggest any transitional  cell carcinoma involving the upper tracts bilaterally. I did elect to go  ahead and take two random biopsies, one on the left lateral wall where there  was slight erythema and the other on the right lateral wall. This was done  with a  cold cup biopsy and these areas were fulgurated. On the left side,  one could see that the biopsy did result in some thinning of the bladder.  I  felt for that  reason probably to be on the safe side, we ought to leave an indwelling  catheter for 48 hours. A 16-French catheter was placed without difficulty.  The patient appeared to tolerate the procedure well, there were no obvious  complications or problems. She was brought to the recovery room in stable  condition.           ______________________________  Valetta Fuller, M.D.  Electronically Signed     DSG/MEDQ  D:  09/19/2005  T:  09/19/2005  Job:  [id number]

## 2011-04-06 NOTE — H&P (Signed)
[facility]  Patient:    Kara Ramirez, Kara Ramirez Visit Number: [id number] MRN: [id number]          Service Type: Attending:  Duke Salvia. Marcelle Overlie, M.D. Dictated by:   Duke Salvia. Marcelle Overlie, M.D. Adm. Date:  09/29/01                           History and Physical  CHIEF COMPLAINT:  Cystocele/stress urinary incontinence.  HISTORY OF PRESENT ILLNESS:  This 60 year old, G2, P2, with symptomatic cystocele and USI presents for anterior repair and sling procedure for incontinence per Barron Alvine, M.D.  The patient underwent LAVH in 1992 for dysmenorrhea and endometriosis.  Both ovaries were conserved at the time and were normal.  She had an ultrasound in April of 2000 because of some right lower quadrant that was normal, except for a small follicle cyst.  The most recent Pap was normal.  Due to some menopausal symptoms, she had [facility] and thyroid profile checked in March of 2002, which were normal.  She has been evaluated per Barron Alvine, M.D., for USI and pelvic pressure symptoms.  On my examination, she has a cystocele with good cuff and posterior support and presents for surgical repair.  This procedure, including the risks, relative bleeding, infection, and her expected recovery time were all reviewed with her, which she understands.  ALLERGIES:  None.  PAST SURGICAL HISTORY:  LAVH.  OBSTETRICAL HISTORY:   Two vaginal deliveries at term.  REVIEW OF SYSTEMS:  Significant for kidney stones and USI symptoms.  GYNECOLOGICAL HISTORY:  Significant for endometriosis and fibroids that lead to the LAVH.  PHYSICAL EXAMINATION:  Temperature 98.2 degrees, blood pressure 104/70.  HEENT:  Unremarkable.  NECK:  Supple without mass.  LUNGS:  Clear.  CARDIOVASCULAR:  Regular rate and rhythm without murmurs, rubs, or gallops noted.  BREASTS:  Without masses.  ABDOMEN:  Soft, flat, and nontender.  PELVIC:  The vulva and vagina were normal.  On straining, there was  a cystocele noted.  The cuff support was good.  Posterior support was good with good development of the RV septum.  Bimanual exam is negative.  EXTREMITIES:  Unremarkable.  NEUROLOGIC:  Unremarkable.  IMPRESSION:  Symptomatic cystocele/stress urinary incontinence.  PLAN:  Cystocele repair.  This will be done in conjunction with Barron Alvine, M.D., performing a sling procedure. Dictated by:   Duke Salvia. Marcelle Overlie, M.D. Attending:  Duke Salvia. Marcelle Overlie, M.D. DD:  08/29/01 TD:  08/29/01 Job: [id number]

## 2011-05-03 ENCOUNTER — Ambulatory Visit
Admission: RE | Admit: 2011-05-03 | Discharge: 2011-05-03 | Disposition: A | Discharge status: Home / Self Care | Payer: Self-pay | Source: Ambulatory Visit | Attending: Neurosurgery | Admitting: Neurosurgery

## 2011-05-03 ENCOUNTER — Other Ambulatory Visit: Payer: Self-pay | Admitting: Neurosurgery

## 2011-05-03 DIAGNOSIS — M545 Low back pain, unspecified: Secondary | ICD-10-CM

## 2011-05-03 DIAGNOSIS — M79606 Pain in leg, unspecified: Secondary | ICD-10-CM

## 2011-06-07 NOTE — Discharge Summary (Signed)
  NAMEMILISA, Ramirez NO.:  [id number]  MEDICAL RECORD NO.:  [id number]  LOCATION:  3027                         FACILITY:  [facility]  PHYSICIAN:  Kathaleen Maser. [name], M.D.    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  HISTORY OF PRESENT ILLNESS:  Kara Ramirez is a 60 year old female with history of chronic lumbar pain which was markedly worsened following a work-related accident.  The patient has an evidence of severe decompensating lumbar scoliosis.  She has failed all conservative management and presents now for lumbar decompression and fusion in hopes of improving her symptoms.  HOSPITAL COURSE:  The patient was taken to the operating room where uncomplicated multilevel lumbar decompression and fusion was performed. Postoperatively, the patient awakened with intact neurological function. Her lower extremity pain was improved but had significant lower back pain.  She had evidence of acute blood loss anemia requiring transfusion.  She was monitored in the ICU.  She was gradually mobilized using physical therapy, occupational therapy.  She made good steady progress with the rehab efforts.  She is able to be discharged home on her sixth postoperative day.  CONDITION AT DISCHARGE:  Improved.  DISCHARGE INSTRUCTIONS:  The patient will be discharged home.  She will follow up in my office in 1 week.          ______________________________ Kathaleen Maser. Kara Ramirez, M.D.     HAP/MEDQ  D:  05/18/2011  T:  05/18/2011  Job:  [id number]  Electronically Signed by [name] M.D. on 06/07/2011 11:46:36 PM

## 2011-06-14 ENCOUNTER — Ambulatory Visit
Admission: RE | Admit: 2011-06-14 | Discharge: 2011-06-14 | Disposition: A | Discharge status: Home / Self Care | Payer: Self-pay | Source: Ambulatory Visit | Attending: Neurosurgery | Admitting: Neurosurgery

## 2011-06-14 ENCOUNTER — Other Ambulatory Visit: Payer: Self-pay | Admitting: Neurosurgery

## 2011-06-14 DIAGNOSIS — M545 Low back pain, unspecified: Secondary | ICD-10-CM

## 2011-08-22 ENCOUNTER — Telehealth: Payer: Self-pay | Admitting: Internal Medicine

## 2011-08-22 NOTE — Telephone Encounter (Signed)
Scheduled patient with Willette Cluster, NP; on 08/23/11 at 10:30 AM. Patient aware.

## 2011-08-23 ENCOUNTER — Ambulatory Visit (INDEPENDENT_AMBULATORY_CARE_PROVIDER_SITE_OTHER): Payer: BC Managed Care – PPO | Admitting: Nurse Practitioner

## 2011-08-23 VITALS — BP 104/70 | HR 88 | Ht 68.0 in | Wt 145.0 lb

## 2011-08-23 DIAGNOSIS — K5900 Constipation, unspecified: Secondary | ICD-10-CM | POA: Insufficient documentation

## 2011-08-23 DIAGNOSIS — K648 Other hemorrhoids: Secondary | ICD-10-CM

## 2011-08-23 DIAGNOSIS — K625 Hemorrhage of anus and rectum: Secondary | ICD-10-CM

## 2011-08-23 DIAGNOSIS — D509 Iron deficiency anemia, unspecified: Secondary | ICD-10-CM

## 2011-08-23 MED ORDER — HYDROCORTISONE ACETATE 25 MG RE SUPP: 25.0000 mg | Freq: Every day | RECTAL | Status: AC

## 2011-08-23 NOTE — Patient Instructions (Signed)
Start Miralax 17 grams in 8 oz of water once daily and increase to twice daily if inadequate bowel movement. Pick up Anusol HC suppositories from your pharmacy to use at night x 7 days.  We have scheduled you to see Dr. Juanda Chance in one month on 09/25/11 at 9:45am. cc: Herb Grays, MD

## 2011-08-27 ENCOUNTER — Encounter: Payer: Self-pay | Admitting: Nurse Practitioner

## 2011-08-27 DIAGNOSIS — D509 Iron deficiency anemia, unspecified: Secondary | ICD-10-CM | POA: Insufficient documentation

## 2011-08-27 NOTE — Progress Notes (Signed)
Kara Ramirez [id number] [birth year]   HISTORY OR PRESENT ILLNESS :  Patient is a 60 year old female known to Dr. Juanda Chance for family history of colon cancer, she had a normal screening colonoscopy in November 2011. Here now for evaluation of rectal bleeding. She struggles with constipation likely related to medications taken for chronic back problems. Patient doesn't really take anything for the constipation. Labs from PCP 08/23/11 reveal normal WBC, hemoglobin of 11.4, MCV of 83, TIBC 387 / 9% saturation, ferritin of 8, B12 of 484, folate of 12, normal CMET.  Current Medications, Allergies, Past Medical History, Past Surgical History, Family History and Social History were reviewed in [employer] record.   PHYSICAL EXAMINATION : General: Well developed  female in no acute distress Head: Normocephalic and atraumatic Eyes:  sclerae anicteric,conjunctive pink. Ears: Normal auditory acuity Mouth: No deformity or lesions Neck: Supple, no masses.  Lungs: Clear throughout to auscultation Heart: Regular rate and rhythm; no murmurs heard Abdomen: Soft, nondistended, nontender. No masses or hepatomegaly noted. Normal bowel sounds Rectal: Internal hemorrhoids on anoscopy. Musculoskeletal: Symmetrical with no gross deformities  Skin: No lesions on visible extremities Extremities: No edema or deformities noted Neurological: Alert oriented x 4, grossly nonfocal Cervical Nodes:  No significant cervical adenopathy Psychological:  Alert and cooperative. Normal mood and affect  ASSESSMENT AND PLAN :

## 2011-08-27 NOTE — Progress Notes (Signed)
Reviewed and agree with management. Cale Decarolis D. Kyron Schlitt, M.D., FACG  

## 2011-08-27 NOTE — Assessment & Plan Note (Addendum)
Hemoglobin 11.4 but ferritin is 8. Patient has had two back surgeries since late Feb 2012 and hemoglobin a couple of weeks prior to surgery in Feb was normal at 13.3.  She may be iron deficient as a result of surgeries as her intermittent rectal bleeding sounds very low volume. She had a screening colonoscopy less than one year ago.  Stool hemoccult not done as it would likely be positive in setting of internal hemorrhoids. Patient may need iron supplementation which will unfortunately add to her constipation problems.

## 2011-08-27 NOTE — Assessment & Plan Note (Addendum)
Patient is on medications which can cause constipation. Begin Miralax one to two times daily. Call in a few days with condition update, we may need to add daily suppositories. Amitiza may be an option in the future. She will follow up with Dr. Juanda Chance in November.

## 2011-08-27 NOTE — Assessment & Plan Note (Signed)
Rectal bleeding most likely secondary to internal hemorrhoids seen on anoscopy today. Refer to "internal hemorrhoids"

## 2011-08-27 NOTE — Assessment & Plan Note (Signed)
Trial of Anusol HC suppositories, treat constipation.

## 2011-09-19 ENCOUNTER — Encounter: Payer: Self-pay | Admitting: *Deleted

## 2011-09-25 ENCOUNTER — Other Ambulatory Visit: Payer: BC Managed Care – PPO

## 2011-09-25 ENCOUNTER — Encounter: Payer: Self-pay | Admitting: Internal Medicine

## 2011-09-25 ENCOUNTER — Ambulatory Visit (INDEPENDENT_AMBULATORY_CARE_PROVIDER_SITE_OTHER): Payer: BC Managed Care – PPO | Admitting: Internal Medicine

## 2011-09-25 DIAGNOSIS — K625 Hemorrhage of anus and rectum: Secondary | ICD-10-CM

## 2011-09-25 DIAGNOSIS — K648 Other hemorrhoids: Secondary | ICD-10-CM

## 2011-09-25 MED ORDER — HYDROCORTISONE ACE-PRAMOXINE 2.5-1 % RE CREA: TOPICAL_CREAM | Freq: Two times a day (BID) | RECTAL | Status: AC | PRN

## 2011-09-25 MED ORDER — HYDROCORTISONE ACETATE 25 MG RE SUPP: 25.0000 mg | Freq: Every day | RECTAL | Status: AC

## 2011-09-25 MED ORDER — POLYETHYLENE GLYCOL 3350 17 GM/SCOOP PO POWD: ORAL | Status: DC

## 2011-09-25 NOTE — Progress Notes (Signed)
Kara Ramirez Oct 25, 1951 MRN [id number]    History of Present Illness:  This is a 60 year old white female with chronic constipation which has deteriorated since her back surgery in April 2012. She is currently taking oxycodone 3 times a day and MiraLax several times a week. She has had intermittent rectal bleeding due to internal hemorrhoids which were seen on an anoscopic exam on10/02/2011. Her last episode of bleeding was one week ago. She denies any rectal pain. Her last colonoscopy in November 2011 was normal. There is a family history of colon cancer in her mother.   Past Medical History  Diagnosis Date  . IBS (irritable bowel syndrome)   . Endometriosis   . Ovarian cyst   . Nephrolithiasis   . Seasonal allergies   . Fibromyalgia   . Cystocele    Past Surgical History  Procedure Date  . Bladder suspension   . Vaginal prolapse repair   . Total abdominal hysterectomy   . Knee surgery     right x2  . Lumbar disc surgery 03/13/2011    T12-L7 PINS AND SCREWS  . Kindey stone removal   . Celiac artery anuerysym   . Cataract extraction     bilateral    reports that she has never smoked. She has never used smokeless tobacco. She reports that she does not drink alcohol or use illicit drugs. family history includes Arthritis in her father; Colon cancer in her mother; Heart disease in her father, maternal grandfather, and paternal grandfather; and Nephrolithiasis in her father. Allergies  Allergen Reactions  . Codeine     REACTION: dizzy and "groggy in my head"  . Eggs Or Egg-Derived Products   . Mold Extract (Trichophyton Mentagrophyte)   . Molds & Smuts   . Morphine     REACTION: tachycardia and anxiety  . Penicillins     REACTION: rash, SOB  . Pentazocine Lactate     REACTION: same as morphine  . Wheat         Review of Systems: Denies any upper GI symptoms of heartburn dyspepsia chest pain or shortness of breath  The remainder of the 10 point ROS is negative  except as outlined in H&P   Physical Exam: General appearance  Well developed, in no distress. Walks around with a cain Eyes- non icteric. HEENT nontraumatic, normocephalic. Mouth no lesions, tongue papillated, no cheilosis. Neck supple without adenopathy, thyroid not enlarged, no carotid bruits, no JVD. Lungs Clear to auscultation bilaterally. Cor normal S1, normal S2, regular rhythm, no murmur,  quiet precordium. Abdomen: Soft nontender abdomen with normal active bowel sounds. Rectal: And anoscopic exam reveals small external hemorrhoidal tags. Normal rectal sphincter tone. Small internal hemorrhoids. Prominent papillae. No fissure or active bleeding, no prolapsing tissue. Extremities no pedal edema. Skin no lesions. Neurological alert and oriented x 3. Psychological normal mood and affect.  Assessment and Plan:  Problem #1 There has been some improvement in the anal fissure and hemorrhoids on today's exam. She will continue on Anusol-HC suppositories as long as she is on narcotics for control of pain. She will also add Analpram cream 2.5% to use when necessary for rectal irritation. She will continue on MiraLax  9 g 3 times a week and when necessary for constipation. She says today that she was diagnosed with a wheat allergy. We will obtain a sprue profile to confirm.   09/25/2011 Lina Sar

## 2011-09-25 NOTE — Patient Instructions (Addendum)
We have sent the following medications to your pharmacy for you to pick up at your convenience: Analpram. Apply to the rectum twice daily as needed Anusol suppositories. Insert 1 suppository into the rectum every night. Your physician has requested that you go to the basement for the following lab work before leaving today: Celiac 10 Panel CC: Dr Collins [place]

## 2011-09-26 LAB — CELIAC PANEL 10
Endomysial Screen: NEGATIVE
Gliadin IgA: 3.2 U/mL (ref <20)
Gliadin IgG: 9.6 U/mL (ref <20)
IgA: 93 mg/dL (ref 69–380)
Tissue Transglut Ab: 9.6 U/mL (ref <20)
Tissue Transglutaminase Ab, IgA: 3.1 U/mL (ref <20)

## 2011-12-12 ENCOUNTER — Ambulatory Visit
Admission: RE | Admit: 2011-12-12 | Discharge: 2011-12-12 | Disposition: A | Discharge status: Home / Self Care | Payer: Self-pay | Source: Ambulatory Visit | Attending: Neurosurgery | Admitting: Neurosurgery

## 2011-12-12 ENCOUNTER — Other Ambulatory Visit: Payer: Self-pay | Admitting: Neurosurgery

## 2011-12-12 DIAGNOSIS — M4120 Other idiopathic scoliosis, site unspecified: Secondary | ICD-10-CM

## 2013-07-16 ENCOUNTER — Other Ambulatory Visit: Payer: Self-pay | Admitting: Obstetrics and Gynecology

## 2013-07-16 DIAGNOSIS — R928 Other abnormal and inconclusive findings on diagnostic imaging of breast: Secondary | ICD-10-CM

## 2013-07-22 ENCOUNTER — Ambulatory Visit
Admission: RE | Admit: 2013-07-22 | Discharge: 2013-07-22 | Disposition: A | Discharge status: Home / Self Care | Payer: No Typology Code available for payment source | Source: Ambulatory Visit | Attending: Obstetrics and Gynecology | Admitting: Obstetrics and Gynecology

## 2013-07-22 DIAGNOSIS — R928 Other abnormal and inconclusive findings on diagnostic imaging of breast: Secondary | ICD-10-CM

## 2013-08-04 ENCOUNTER — Other Ambulatory Visit: Payer: Self-pay

## 2013-10-26 DIAGNOSIS — N819 Female genital prolapse, unspecified: Secondary | ICD-10-CM | POA: Insufficient documentation

## 2014-08-04 ENCOUNTER — Other Ambulatory Visit: Payer: Self-pay | Admitting: Obstetrics and Gynecology

## 2014-08-05 LAB — CYTOLOGY - PAP

## 2015-02-17 ENCOUNTER — Ambulatory Visit: Payer: No Typology Code available for payment source | Admitting: Cardiovascular Disease

## 2015-05-24 ENCOUNTER — Encounter: Payer: Self-pay | Admitting: Internal Medicine

## 2015-05-27 ENCOUNTER — Ambulatory Visit (INDEPENDENT_AMBULATORY_CARE_PROVIDER_SITE_OTHER): Payer: Medicare Other | Admitting: Cardiovascular Disease

## 2015-05-27 ENCOUNTER — Encounter: Payer: Self-pay | Admitting: Cardiovascular Disease

## 2015-05-27 VITALS — BP 112/80 | HR 80 | Ht 67.0 in | Wt 165.1 lb

## 2015-05-27 DIAGNOSIS — E785 Hyperlipidemia, unspecified: Secondary | ICD-10-CM | POA: Insufficient documentation

## 2015-05-27 DIAGNOSIS — F1921 Other psychoactive substance dependence, in remission: Secondary | ICD-10-CM | POA: Diagnosis not present

## 2015-05-27 DIAGNOSIS — R079 Chest pain, unspecified: Secondary | ICD-10-CM

## 2015-05-27 NOTE — Progress Notes (Signed)
05/27/2015 Kara Ramirez   10/30/1951  [id number]  Primary Physician Florina Ou, MD Primary Cardiologist: Lorretta Harp MD Renae Gloss   HPI:  Kara Ramirez is a 64 year old mildly overweight married Caucasian female mother of 2, grandmother of 1 grandchild whose husband is also patient mild is accompanied her today. She is retired from working in [employer]. She has no primary care physician. She really has no risk factors other than mild hyperlipidemia on red yeast rice. Her sister did have myocardial infarctions. She's had chest pain off and on for a year which is somewhat atypical. It occurs typically last for minutes at a time with occasional associated shortness of breath.   Current Outpatient Prescriptions  Medication Sig Dispense Refill  . albuterol (PROVENTIL HFA;VENTOLIN HFA) 108 (90 BASE) MCG/ACT inhaler Inhale 2 puffs into the lungs every 6 (six) hours as needed for wheezing or shortness of breath.    [name] alendronate (FOSAMAX) 70 MG tablet Take 70 mg by mouth once a week. Take with a full glass of water on an empty stomach.    . Aspirin-Acetaminophen-Caffeine (EXCEDRIN EXTRA STRENGTH PO) Take 1 tablet by mouth daily as needed.    . beclomethasone (QVAR) 40 MCG/ACT inhaler Inhale 1 puff into the lungs 2 (two) times daily.    . Calcium Carbonate-Vitamin D (CALCIUM 600+D) 600-400 MG-UNIT per tablet Take 1 tablet by mouth 2 (two) times daily.      . cholecalciferol (VITAMIN D) 1000 UNITS tablet Take 1,000 Units by mouth daily.      . Coenzyme Q10 (CO Q 10) 60 MG CAPS Take 1 tablet by mouth 2 (two) times daily.      . fluticasone (FLONASE) 50 MCG/ACT nasal spray Place 1 spray into the nose daily.      [name] ketotifen (ZADITOR) 0.025 % ophthalmic solution 1 drop 2 (two) times daily.    [name] loratadine (CLARITIN) 10 MG tablet Take 10 mg by mouth daily.      [name] MAGNESIUM SULFATE PO Take 1 tablet by mouth daily.      [name] MANGANESE PO Take 1 tablet by mouth daily.      . montelukast  (SINGULAIR) 5 MG chewable tablet Chew 5 mg by mouth at bedtime.    . naproxen sodium (ALEVE) 220 MG tablet Take 220 mg by mouth 2 (two) times daily with a meal.    . Olopatadine HCl (PAZEO) 0.7 % SOLN Apply 1 drop to eye daily as needed.    . Omega-3 Fatty Acids (FISH OIL) 1000 MG CAPS Take 1 capsule by mouth daily.    . polyethylene glycol powder (GLYCOLAX/MIRALAX) powder Take 9 grams (1/2 scoop) dissolved in at least 8 ounces of water/juice three times per week. 527 g 2  . Red Yeast Rice Extract (RED YEAST RICE PO) Take 1 tablet by mouth 2 (two) times daily.    . Thiamine HCl (VITAMIN B-1) 100 MG tablet Take 100 mg by mouth daily.      . vitamin E 400 UNIT capsule Take 400 Units by mouth at bedtime.       No current facility-administered medications for this visit.    Allergies  Allergen Reactions  . Codeine     REACTION: dizzy and "groggy in my head"  . Eggs Or Egg-Derived Products   . Mold Extract [Trichophyton Mentagrophyte]   . Molds & Smuts   . Morphine     REACTION: tachycardia and anxiety  . Penicillins     REACTION:  rash, SOB  . Pentazocine Lactate     REACTION: same as morphine  . Wheat     History   Social History  . Marital Status: Married    Spouse Name: N/A  . Number of Children: 2  . Years of Education: N/A   Occupational History  . retired    Social History Main Topics  . Smoking status: Never Smoker   . Smokeless tobacco: Never Used  . Alcohol Use: No  . Drug Use: No  . Sexual Activity: Not on file   Other Topics Concern  . Not on file   Social History Narrative     Review of Systems: General: negative for chills, fever, night sweats or weight changes.  Cardiovascular: negative for chest pain, dyspnea on exertion, edema, orthopnea, palpitations, paroxysmal nocturnal dyspnea or shortness of breath Dermatological: negative for rash Respiratory: negative for cough or wheezing Urologic: negative for hematuria Abdominal: negative for nausea,  vomiting, diarrhea, bright red blood per rectum, melena, or hematemesis Neurologic: negative for visual changes, syncope, or dizziness All other systems reviewed and are otherwise negative except as noted above.    Blood pressure 112/80, pulse 80, height 5\' 7"  (1.702 m), weight 165 lb 1.6 oz (74.889 kg).  General appearance: alert and no distress Neck: no adenopathy, no carotid bruit, no JVD, supple, symmetrical, trachea midline and thyroid not enlarged, symmetric, no tenderness/mass/nodules Lungs: clear to auscultation bilaterally Heart: regular rate and rhythm, S1, S2 normal, no murmur, click, rub or gallop Extremities: extremities normal, atraumatic, no cyanosis or edema  EKG normal sinus rhythm at 80 without ST or T-wave changes.  This EKG  ASSESSMENT AND PLAN:   Chest pain This is self-referred for evaluation of chest pain. She really has no risk factors. The pain is somewhat atypical, occurs weekly and last for seconds to minutes that time. It is somewhat improved with stretching. She says there is some associated shortness of breath. I'm going to get a formal Myoview stress test to rule out ischemic etiology. She is unable to exercise.  Hyperlipidemia History of hyperlipidemia on red yeast rice. We will recheck a lipid and liver profile      Lorretta Harp MD [facility], [facility] 05/27/2015 11:05 AM

## 2015-05-27 NOTE — Assessment & Plan Note (Signed)
This is self-referred for evaluation of chest pain. She really has no risk factors. The pain is somewhat atypical, occurs weekly and last for seconds to minutes that time. It is somewhat improved with stretching. She says there is some associated shortness of breath. I'm going to get a formal Myoview stress test to rule out ischemic etiology. She is unable to exercise.

## 2015-05-27 NOTE — Patient Instructions (Signed)
Your physician recommends that you return for lab work at your earliest [place].  Dr Gwenlyn Found has requested that you have a lexiscan myoview. For further information please visit [email or web address]. Please follow instruction sheet, as given.  Dr Gwenlyn Found recommends that you follow-up with him as needed.

## 2015-05-27 NOTE — Assessment & Plan Note (Signed)
History of hyperlipidemia on red yeast rice. We will recheck a lipid and liver profile

## 2015-05-31 LAB — LIPID PANEL
Cholesterol: 234 mg/dL — ABNORMAL HIGH (ref 0–200)
HDL: 57 mg/dL (ref >=46)
LDL Cholesterol: 151 mg/dL — ABNORMAL HIGH (ref 0–99)
Total CHOL/HDL Ratio: 4.1 Ratio
Triglycerides: 131 mg/dL (ref <150)
VLDL: 26 mg/dL (ref 0–40)

## 2015-05-31 LAB — HEPATIC FUNCTION PANEL
ALT: 23 U/L (ref 0–35)
AST: 21 U/L (ref 0–37)
Albumin: 4.3 g/dL (ref 3.5–5.2)
Alkaline Phosphatase: 43 U/L (ref 39–117)
Bilirubin, Direct: 0.1 mg/dL (ref 0.0–0.3)
Indirect Bilirubin: 0.4 mg/dL (ref 0.2–1.2)
Total Bilirubin: 0.5 mg/dL (ref 0.2–1.2)
Total Protein: 6.8 g/dL (ref 6.0–8.3)

## 2015-06-03 ENCOUNTER — Telehealth: Payer: Self-pay | Admitting: Cardiovascular Disease

## 2015-06-03 DIAGNOSIS — Z79899 Other long term (current) drug therapy: Secondary | ICD-10-CM

## 2015-06-03 DIAGNOSIS — E785 Hyperlipidemia, unspecified: Secondary | ICD-10-CM

## 2015-06-03 MED ORDER — ATORVASTATIN CALCIUM 40 MG PO TABS: 40.0000 mg | ORAL_TABLET | Freq: Every day | ORAL | Status: DC

## 2015-06-03 NOTE — Telephone Encounter (Signed)
Pt called in stating that she would like her results from her blood test that she took last week. Please call back  Thanks

## 2015-06-03 NOTE — Telephone Encounter (Signed)
Left message on patient's home answering machine.

## 2015-06-03 NOTE — Telephone Encounter (Signed)
-----   Message from Lorretta Harp, MD sent at 05/31/2015  6:04 AM EDT ----- Start atorva 40 and Co Q 10 200, re check

## 2015-06-03 NOTE — Telephone Encounter (Signed)
Patient notified of results RX sent to pharmacy Recheck lap slip mailed

## 2015-06-09 ENCOUNTER — Telehealth (HOSPITAL_COMMUNITY): Payer: Self-pay | Admitting: Radiology

## 2015-06-09 NOTE — Telephone Encounter (Signed)
Encounter complete. 

## 2015-06-14 ENCOUNTER — Ambulatory Visit (HOSPITAL_COMMUNITY)
Admission: RE | Admit: 2015-06-14 | Discharge: 2015-06-14 | Disposition: A | Discharge status: Home / Self Care | Payer: Medicare Other | Source: Ambulatory Visit | Attending: Cardiology | Admitting: Cardiology

## 2015-06-14 DIAGNOSIS — R079 Chest pain, unspecified: Secondary | ICD-10-CM

## 2015-06-14 LAB — MYOCARDIAL PERFUSION IMAGING
LV dias vol: 91 mL
LV sys vol: 46 mL
Peak HR: 96 {beats}/min
Rest HR: 74 {beats}/min
SDS: 1
SRS: 1
SSS: 2
TID: 1.08

## 2015-06-14 MED ORDER — REGADENOSON 0.4 MG/5ML IV SOLN
0.4000 mg | Freq: Once | INTRAVENOUS | Status: AC
Start: 2015-06-14 — End: 2015-06-14
  Administered 2015-06-14: 0.4 mg via INTRAVENOUS

## 2015-06-14 MED ORDER — TECHNETIUM TC 99M SESTAMIBI GENERIC - CARDIOLITE
10.3000 | Freq: Once | INTRAVENOUS | Status: AC | PRN
  Administered 2015-06-14: 10 via INTRAVENOUS

## 2015-06-14 MED ORDER — AMINOPHYLLINE 25 MG/ML IV SOLN
75.0000 mg | Freq: Once | INTRAVENOUS | Status: AC
  Administered 2015-06-14: 75 mg via INTRAVENOUS

## 2015-06-14 MED ORDER — TECHNETIUM TC 99M SESTAMIBI GENERIC - CARDIOLITE
30.4000 | Freq: Once | INTRAVENOUS | Status: AC | PRN
  Administered 2015-06-14: 30 via INTRAVENOUS

## 2015-06-15 ENCOUNTER — Telehealth: Payer: Self-pay | Admitting: *Deleted

## 2015-06-15 DIAGNOSIS — R0602 Shortness of breath: Secondary | ICD-10-CM

## 2015-06-15 NOTE — Telephone Encounter (Signed)
-----   Message from Lorretta Harp, MD sent at 06/15/2015  7:53 AM EDT ----- Low risk myoview with low nl EF. Check 2D for LV Fxn then ROV

## 2015-06-15 NOTE — Telephone Encounter (Signed)
Patient notified of results Echo ordered

## 2015-06-16 ENCOUNTER — Encounter: Payer: Self-pay | Admitting: Gastroenterology

## 2015-06-16 ENCOUNTER — Telehealth: Payer: Self-pay | Admitting: Cardiovascular Disease

## 2015-06-16 NOTE — Telephone Encounter (Signed)
Closed encounter °

## 2015-06-17 ENCOUNTER — Ambulatory Visit (HOSPITAL_COMMUNITY): Payer: Medicare Other | Attending: Cardiovascular Disease

## 2015-06-17 ENCOUNTER — Other Ambulatory Visit (HOSPITAL_COMMUNITY): Payer: Medicare Other

## 2015-06-17 ENCOUNTER — Other Ambulatory Visit: Payer: Self-pay

## 2015-06-17 DIAGNOSIS — D649 Anemia, unspecified: Secondary | ICD-10-CM | POA: Insufficient documentation

## 2015-06-17 DIAGNOSIS — I351 Nonrheumatic aortic (valve) insufficiency: Secondary | ICD-10-CM | POA: Insufficient documentation

## 2015-06-17 DIAGNOSIS — R0602 Shortness of breath: Secondary | ICD-10-CM

## 2015-06-17 DIAGNOSIS — I5189 Other ill-defined heart diseases: Secondary | ICD-10-CM | POA: Diagnosis not present

## 2015-06-17 DIAGNOSIS — I517 Cardiomegaly: Secondary | ICD-10-CM | POA: Insufficient documentation

## 2015-06-17 DIAGNOSIS — E785 Hyperlipidemia, unspecified: Secondary | ICD-10-CM | POA: Insufficient documentation

## 2015-06-21 ENCOUNTER — Ambulatory Visit (INDEPENDENT_AMBULATORY_CARE_PROVIDER_SITE_OTHER): Payer: Medicare Other | Admitting: Cardiovascular Disease

## 2015-06-21 ENCOUNTER — Encounter: Payer: Self-pay | Admitting: Cardiovascular Disease

## 2015-06-21 VITALS — BP 102/80 | HR 72 | Ht 68.0 in | Wt 162.2 lb

## 2015-06-21 DIAGNOSIS — R079 Chest pain, unspecified: Secondary | ICD-10-CM

## 2015-06-21 MED ORDER — PANTOPRAZOLE SODIUM 40 MG PO TBEC: 40.0000 mg | DELAYED_RELEASE_TABLET | Freq: Every day | ORAL | Status: DC

## 2015-06-21 NOTE — Patient Instructions (Signed)
Your physician wants you to follow-up in: 3 months with Dr Gwenlyn Found.  You will receive a reminder letter in the mail two months in advance. If you don't receive a letter, please call our office to schedule the follow-up appointment.   Start Protonix 40mg  daily.

## 2015-06-21 NOTE — Assessment & Plan Note (Signed)
Kara Ramirez returns today for follow-up for outpatient noninvasive diagnostic tests performed to evaluate chest pain. Her 2-D echo and Myoview stress tests were entirely normal. I reassured her that it is unlikely that her pain is cardiovascular in nature. I am going to empirically begin Protonix to rule out a GI etiology I will see her back in 3 months. Should she continue to have chest pain we'll entertain a more invasive strategy. She did start on Lipitor for her hyperlipidemia with a LDL of 150 and scheduled to have repeat a lipid and liver profile in several months.

## 2015-06-21 NOTE — Progress Notes (Signed)
Kara Ramirez returns today for follow-up for outpatient noninvasive diagnostic tests performed to evaluate chest pain. Her 2-D echo and Myoview stress tests were entirely normal. I reassured her that it is unlikely that her pain is cardiovascular in nature. I am going to empirically begin Protonix to rule out a GI etiology I will see her back in 3 months. Should she continue to have chest pain we'll entertain a more invasive strategy. She did start on Lipitor for her hyperlipidemia with a LDL of 150 and scheduled to have repeat a lipid and liver profile in several months.   Lorretta Harp, M.D., [place], [facility], Laverta [place] [address] [place], [place]  [address]  [phone] 06/21/2015 11:12 AM

## 2015-08-04 ENCOUNTER — Encounter: Payer: Self-pay | Admitting: Internal Medicine

## 2015-08-04 ENCOUNTER — Ambulatory Visit: Payer: No Typology Code available for payment source | Admitting: Internal Medicine

## 2015-08-10 ENCOUNTER — Encounter: Payer: Self-pay | Admitting: Gastroenterology

## 2015-08-10 ENCOUNTER — Telehealth: Payer: Self-pay | Admitting: Gastroenterology

## 2015-08-10 ENCOUNTER — Ambulatory Visit (INDEPENDENT_AMBULATORY_CARE_PROVIDER_SITE_OTHER): Payer: Medicare Other | Admitting: Gastroenterology

## 2015-08-10 ENCOUNTER — Other Ambulatory Visit: Payer: Self-pay

## 2015-08-10 VITALS — BP 102/80 | HR 92 | Ht 65.5 in | Wt 162.0 lb

## 2015-08-10 DIAGNOSIS — R0789 Other chest pain: Secondary | ICD-10-CM | POA: Diagnosis not present

## 2015-08-10 DIAGNOSIS — K589 Irritable bowel syndrome without diarrhea: Secondary | ICD-10-CM | POA: Diagnosis not present

## 2015-08-10 DIAGNOSIS — R079 Chest pain, unspecified: Secondary | ICD-10-CM

## 2015-08-10 DIAGNOSIS — R1310 Dysphagia, unspecified: Secondary | ICD-10-CM

## 2015-08-10 DIAGNOSIS — Z1211 Encounter for screening for malignant neoplasm of colon: Secondary | ICD-10-CM | POA: Diagnosis not present

## 2015-08-10 DIAGNOSIS — R1314 Dysphagia, pharyngoesophageal phase: Secondary | ICD-10-CM | POA: Diagnosis not present

## 2015-08-10 MED ORDER — NA SULFATE-K SULFATE-MG SULF 17.5-3.13-1.6 GM/177ML PO SOLN: ORAL | Status: DC

## 2015-08-10 NOTE — Telephone Encounter (Signed)
Called pt to inform her i sent prep to pharmacy. Left vm for pt to call back.

## 2015-08-10 NOTE — Patient Instructions (Signed)
You have been scheduled for an endoscopy and colonoscopy. Please follow the written instructions given to you at your visit today. Please pick up your prep supplies at the pharmacy within the next 1-3 days. If you use inhalers (even only as needed), please bring them with you on the day of your procedure. Your physician has requested that you go to www.startemmi.com and enter the access code given to you at your visit today. This web site gives a general overview about your procedure. However, you should still follow specific instructions given to you by our office regarding your preparation for the procedure.  We have sent the following medications to your pharmacy for you to pick up at your convenience:   Begin taking Citrucel Over the counter daily.   Use Maalox as needed.

## 2015-08-10 NOTE — Progress Notes (Signed)
HPI :  64 y/o female here in consultation from Dr. Quay Burow for chest pain. She has been a patient of Dr. Olevia Perches, last seen 4 years ago. Here for symptoms of atypical chest pain, dysphagia, IBS, and CRC screening.  Patient recently has had some chest pain evaluated by cardiology and underwent a stress test which was negative. She thinks she had some chest pain over the past few months, starting in the Spring. She felt it in her lower chest and epigastric area. She had it several times, which lasted roughly 20 minutes at a time and then go away on its own. She reported it occurred at night and not associated with eating. Symptoms were not exertional. She does not think this bothered her recently, the last time it bothered her was August.  She was started empirically on protonix by cardiology, and did not like the way she felt on it. Protonix made her nauseous and caused vomiting. She has stopped protonix given it does not make her feel well. She had no nausea or vomiting with it. She took some TUMS and it would help abate the discomfort. She otherwise does endorse some dysphagia which is longstanding. This only happens to solids. No dysphagia to liquids. No odynophagia. She has not had a upper endoscopy previously. She denies baseline nausea or vomiting.   Her last colonoscopy in 2011 and was normal. She has also had a colonoscopy in 2006 was normal. Her mother had colon cancer around age 25-60 time frame and has been having colonoscopy every 5 yrs. She endorses a remote history of colon polyps. She denies any new changes in her bowels. No blood in the stools.  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 She has used miralax previously which did not help too much. She has tried fiber supplements in the past which made her bloated and did not like the way she felt on them. No weight loss.    Past Medical History  Diagnosis Date  . IBS (irritable  bowel syndrome)   . Endometriosis   . Ovarian cyst   . Nephrolithiasis   . Seasonal allergies   . Fibromyalgia   . Cystocele   . Chest pain   . Hyperlipidemia   . Anemia   . Arthritis   . Asthma   . Chronic headaches      Past Surgical History  Procedure Laterality Date  . Bladder suspension    . Vaginal prolapse repair    . Total abdominal hysterectomy    . Knee surgery Right     right x2  . Lumbar disc surgery  03/13/2011    T12-L7 PINS AND SCREWS  . Kindey stone removal    . Celiac artery anuerysym    . Cataract extraction Bilateral    Family History  Problem Relation Age of Onset  . Colon cancer Mother   . Heart disease Father   . Heart disease Maternal Grandfather   . Heart disease Paternal Grandfather   . Arthritis Father   . Nephrolithiasis Father   . Anemia Mother     Aplastic anemia-Purpra   Social History  Substance Use Topics  . Smoking status: Never Smoker   . Smokeless tobacco: Never Used  . Alcohol Use: No   Current Outpatient Prescriptions  Medication Sig Dispense Refill  . albuterol (PROVENTIL HFA;VENTOLIN HFA) 108 (90 BASE) MCG/ACT inhaler Inhale 2 puffs into the lungs every 6 (six) hours as needed for  wheezing or shortness of breath.    [name] alendronate (FOSAMAX) 70 MG tablet Take 70 mg by mouth once a week. Take with a full glass of water on an empty stomach.    . Aspirin-Acetaminophen-Caffeine (EXCEDRIN EXTRA STRENGTH PO) Take 1 tablet by mouth daily as needed.    [name] atorvastatin (LIPITOR) 40 MG tablet Take 1 tablet (40 mg total) by mouth daily. 30 tablet 11  . beclomethasone (QVAR) 40 MCG/ACT inhaler Inhale 1 puff into the lungs 2 (two) times daily.    . Calcium Carbonate-Vitamin D (CALCIUM 600+D) 600-400 MG-UNIT per tablet Take 1 tablet by mouth 2 (two) times daily.      . cholecalciferol (VITAMIN D) 1000 UNITS tablet Take 1,000 Units by mouth daily.      . Coenzyme Q10 (CO Q 10) 60 MG CAPS Take 1 tablet by mouth 2 (two) times daily.      .  fluticasone (FLONASE) 50 MCG/ACT nasal spray Place 1 spray into the nose daily.      [name] ketotifen (ZADITOR) 0.025 % ophthalmic solution 1 drop 2 (two) times daily.    [name] loratadine (CLARITIN) 10 MG tablet Take 10 mg by mouth daily.      [name] MAGNESIUM SULFATE PO Take 1 tablet by mouth daily.      [name] MANGANESE PO Take 1 tablet by mouth daily.      . montelukast (SINGULAIR) 5 MG chewable tablet Chew 5 mg by mouth at bedtime.    . naproxen sodium (ALEVE) 220 MG tablet Take 220 mg by mouth 2 (two) times daily with a meal.    . Olopatadine HCl (PAZEO) 0.7 % SOLN Apply 1 drop to eye daily as needed.    . Omega-3 Fatty Acids (FISH OIL) 1000 MG CAPS Take 1 capsule by mouth daily.    . polyethylene glycol powder (GLYCOLAX/MIRALAX) powder Take 9 grams (1/2 scoop) dissolved in at least 8 ounces of water/juice three times per week. 527 g 2  . Red Yeast Rice Extract (RED YEAST RICE PO) Take 1 tablet by mouth 2 (two) times daily.    . Thiamine HCl (VITAMIN B-1) 100 MG tablet Take 100 mg by mouth daily.      . vitamin E 400 UNIT capsule Take 400 Units by mouth at bedtime.       No current facility-administered medications for this visit.   Allergies  Allergen Reactions  . Codeine     REACTION: dizzy and "groggy in my head"  . Eggs Or Egg-Derived Products   . Mold Extract [Trichophyton Mentagrophyte]   . Molds & Smuts   . Morphine     REACTION: tachycardia and anxiety  . Penicillins     REACTION: rash, SOB  . Pentazocine Lactate     REACTION: same as morphine  . Wheat      Review of Systems: All systems reviewed and negative except where noted in HPI.    Recent LFTs normal. Celiac testing historically  Negative  Recent nuclear stress test negative  Physical Exam: BP 102/80 mmHg  Pulse 92  Ht 5' 5.5" (1.664 m)  Wt 162 lb (73.483 kg)  BMI 26.54 kg/m2 Constitutional: Pleasant,well-developed, female in no acute distress. HEENT: Normocephalic and atraumatic. Conjunctivae are normal. No scleral  icterus. Neck supple.  Cardiovascular: Normal rate, regular rhythm.  Pulmonary/chest: Effort normal and breath sounds normal. No wheezing, rales or rhonchi. Abdominal: Soft, nondistended, nontender. Bowel sounds active throughout. There are no masses palpable. No hepatomegaly. Extremities: no edema Lymphadenopathy:  No cervical adenopathy noted. Neurological: Alert and oriented to person place and time. Skin: Skin is warm and dry. No rashes noted. Psychiatric: Normal mood and affect. Behavior is normal.   ASSESSMENT AND PLAN: 64 y/o female here for a few issues today: chest pain, dysphagia, IBS-C, and CRC screening. .   Chest pain - cardiac workup negative. Described as above, some improvement with TUMS as needed. PPI caused GI upset, did not like the way it made her feel and stopped it. Her symptoms certainly could have been from reflux or esophageal spasm. She has not had symptoms in over a month at this point and no longer has bothered her, at least recently. Given her intolerance to PPIs, recommend OTC Maalox PRN at onset of symptoms to see if this helps, and if so, almost certainly is reflux.  Dysphagia -  Otherwise, the patient has longstanding intermittent dysphagia to solids which she has not previously had evaluated. I offered her upper endoscopy to evaluate this symptom, and dilation as needed. We will also evaluate her esophagus for erosive changes in regards to her chest pain. She wished to proceed with this.   CRC screening. Mother with CRC < age 18. Last colonoscopy 5 years ago. Offered her optical colonoscopy for CRC screening given her family history. She wished to proceed  IBS-C - longstanding, currently does not take anything. Did not like miralax in the past. Fiber supplements have helped her but metamucil causes bloating. Recommend a trial of Citrucel OTC, hopefully helps without the bloating. She will assess response and follow up as needed for this issue.  The indications,  risks, and benefits of EGD and colonoscopy were explained to the patient in detail. Risks include but are not limited to bleeding, perforation, adverse reaction to medications, and cardiopulmonary compromise. Sequelae include but are not limited to the possibility of surgery, hositalization, and mortality. The patient verbalized understanding and wished to proceed. All questions answered, referred to scheduler and bowel prep ordered. Further recommendations pending results of the exam.   [place] Cellar, MD [facility] Pager [phone]

## 2015-08-11 NOTE — Telephone Encounter (Signed)
Called pt and informed her that prep was sent to her pharmacy.

## 2015-08-16 ENCOUNTER — Ambulatory Visit (AMBULATORY_SURGERY_CENTER): Payer: Medicare Other | Admitting: Gastroenterology

## 2015-08-16 ENCOUNTER — Encounter: Payer: Self-pay | Admitting: Gastroenterology

## 2015-08-16 VITALS — BP 120/73 | HR 62 | Temp 97.7°F | Resp 21 | Ht 65.0 in | Wt 162.0 lb

## 2015-08-16 DIAGNOSIS — Z1211 Encounter for screening for malignant neoplasm of colon: Secondary | ICD-10-CM

## 2015-08-16 DIAGNOSIS — K2970 Gastritis, unspecified, without bleeding: Secondary | ICD-10-CM | POA: Diagnosis not present

## 2015-08-16 DIAGNOSIS — K2990 Gastroduodenitis, unspecified, without bleeding: Secondary | ICD-10-CM

## 2015-08-16 DIAGNOSIS — Z800 Family history of malignant neoplasm of digestive organs: Secondary | ICD-10-CM | POA: Diagnosis not present

## 2015-08-16 DIAGNOSIS — K635 Polyp of colon: Secondary | ICD-10-CM

## 2015-08-16 DIAGNOSIS — Q394 Esophageal web: Secondary | ICD-10-CM | POA: Diagnosis not present

## 2015-08-16 DIAGNOSIS — R1310 Dysphagia, unspecified: Secondary | ICD-10-CM | POA: Diagnosis not present

## 2015-08-16 DIAGNOSIS — D123 Benign neoplasm of transverse colon: Secondary | ICD-10-CM

## 2015-08-16 MED ORDER — SODIUM CHLORIDE 0.9 % IV SOLN: 500.0000 mL | INTRAVENOUS | Status: DC

## 2015-08-16 NOTE — Progress Notes (Signed)
To recovery, report to RN, VSS. 

## 2015-08-16 NOTE — Op Note (Signed)
[place]  [address]. [place], [address]   COLONOSCOPY PROCEDURE REPORT  PATIENT: Kara, Ramirez  MR#: [id number] BIRTHDATE: 22-Jul-1951 , 64  yrs. old GENDER: female ENDOSCOPIST: [place] Cellar, MD REFERRED BY: PROCEDURE DATE:  08/16/2015 PROCEDURE:   Colonoscopy with snare polypectomy First Screening Colonoscopy - Avg.  risk and is 50 yrs.  old or older - No.  Prior Negative Screening - Now for repeat screening. Above average risk  History of Adenoma - Now for follow-up colonoscopy & has been > or = to 3 yrs.  Yes hx of adenoma.  Has been 3 or more years since last colonoscopy.  Polyps removed today? Yes ASA CLASS:   Class II INDICATIONS:Screening for colonic neoplasia and FH Colon or Rectal Adenocarcinoma. MEDICATIONS: Propofol 160 mg IV  DESCRIPTION OF PROCEDURE:   After the risks benefits and alternatives of the procedure were thoroughly explained, informed consent was obtained.  The digital rectal exam revealed no abnormalities of the rectum.   The LB [id number]-[id number]  endoscope was introduced through the anus and advanced to the cecum, which was identified by both the appendix and ileocecal valve. No adverse events experienced.   The quality of the prep was adequate  The instrument was then slowly withdrawn as the colon was fully examined. Estimated blood loss is zero unless otherwise noted in this procedure report.  COLON FINDINGS: A sessile polyp measuring 5 mm in size was found in the transverse colon.  A polypectomy was performed with a cold snare.  The resection was complete, the polyp tissue was completely retrieved and sent to histology.   There was mild diverticulosis noted in the sigmoid colon.   The examination was otherwise normal. Retroflexed views revealed internal hemorrhoids. The time to cecum = 2.4 Withdrawal time = 11.6   The scope was withdrawn and the procedure completed. COMPLICATIONS: There were no immediate  complications.  ENDOSCOPIC IMPRESSION: 1.   Sessile polyp was found in the transverse colon; polypectomy was performed with a cold snare 2.   Mild diverticulosis was noted in the sigmoid colon 3.   The examination was otherwise normal  RECOMMENDATIONS: 1.  Hold Aspirin and all other NSAIDS for 2 weeks. 2.  Resume diet 3.  Resume medications 4.  Await pathology results.  Repeat colonoscopy in 5 years regardless of results given family history of colon cancer.  eSigned:  Kathleen Cellar, MD 08/16/2015 8:09 AM   cc:

## 2015-08-16 NOTE — Patient Instructions (Signed)
YOU HAD AN ENDOSCOPIC PROCEDURE TODAY AT [facility]:   Refer to the procedure report that was given to you for any specific questions about what was found during the examination.  If the procedure report does not answer your questions, please call your gastroenterologist to clarify.  If you requested that your care partner not be given the details of your procedure findings, then the procedure report has been included in a sealed envelope for you to review at your convenience later.  YOU SHOULD EXPECT: Some feelings of bloating in the abdomen. Passage of more gas than usual.  Walking can help get rid of the air that was put into your GI tract during the procedure and reduce the bloating. If you had a lower endoscopy (such as a colonoscopy or flexible sigmoidoscopy) you may notice spotting of blood in your stool or on the toilet paper. If you underwent a bowel prep for your procedure, you may not have a normal bowel movement for a few days.  Please Note:  You might notice some irritation and congestion in your nose or some drainage.  This is from the oxygen used during your procedure.  There is no need for concern and it should clear up in a day or so.  SYMPTOMS TO REPORT IMMEDIATELY:   Following lower endoscopy (colonoscopy or flexible sigmoidoscopy):  Excessive amounts of blood in the stool  Significant tenderness or worsening of abdominal pains  Swelling of the abdomen that is new, acute  Fever of 100F or higher   Following upper endoscopy (EGD)  Vomiting of blood or coffee ground material  New chest pain or pain under the shoulder blades  Painful or persistently difficult swallowing  New shortness of breath  Fever of 100F or higher  Black, tarry-looking stools  For urgent or emergent issues, a gastroenterologist can be reached at any hour by calling [phone].    DIET:  Drink plenty of fluids but you should avoid alcoholic beverages for 24 hours.  Please follow  the dilatation diet the rest of the day.  Handout given to your care partner with suggestions for the dilatation diet.  ACTIVITY:  You should plan to take it easy for the rest of today and you should NOT DRIVE or use heavy machinery until tomorrow (because of the sedation medicines used during the test).    FOLLOW UP: Our staff will call the number listed on your records the next business day following your procedure to check on you and address any questions or concerns that you may have regarding the information given to you following your procedure. If we do not reach you, we will leave a message.  However, if you are feeling well and you are not experiencing any problems, there is no need to return our call.  We will assume that you have returned to your regular daily activities without incident.  If any biopsies were taken you will be contacted by phone or by letter within the next 1-3 weeks.  Please call us at [phone] if you have not heard about the biopsies in 3 weeks.    SIGNATURES/CONFIDENTIALITY: You and/or your care partner have signed paperwork which will be entered into your electronic medical record.  These signatures attest to the fact that that the information above on your After Visit Summary has been reviewed and is understood.  Full responsibility of the confidentiality of this discharge information lies with you and/or your care-partner.    Handouts were  given to your care partner on polyps, diverticulosis, a high fiber diet with liberal fluid intake, hiatal hernia,a dilatation diet to follow the rest of the day and gastritis. Please follow the dilatation diet the rest of the day.  Handout was given to your care partner. Avoid NSAIDs and aspirin for the next two weeks. You may resume your current medications today. Await biopsy results. Please call if any questions or concerns.

## 2015-08-16 NOTE — Progress Notes (Signed)
Called to room to assist during endoscopic procedure.  Patient ID and intended procedure confirmed with present staff. Received instructions for my participation in the procedure from the performing physician.  

## 2015-08-16 NOTE — Progress Notes (Signed)
No problems noted in the recovery room. maw 

## 2015-08-16 NOTE — Op Note (Signed)
[place]  [address]. [place], [address]   ENDOSCOPY PROCEDURE REPORT  PATIENT: Kara Ramirez, Kara Ramirez  MR#: [id number] BIRTHDATE: 27-May-1951 , 64  yrs. old GENDER: female ENDOSCOPIST: Clyde Cellar, MD REFERRED BY: PROCEDURE DATE:  08/16/2015 PROCEDURE:  EGD w/ balloon dilation and EGD w/ biopsy ASA CLASS:     Class II INDICATIONS:  dysphagia and chest pain. MEDICATIONS: Propofol 240 mg IV TOPICAL ANESTHETIC:  DESCRIPTION OF PROCEDURE: After the risks benefits and alternatives of the procedure were thoroughly explained, informed consent was obtained.  The LB [id number] endoscope was introduced through the mouth and advanced to the second portion of the duodenum , Without limitations.  The instrument was slowly withdrawn as the mucosa was fully examined.    FINDINGS: The esophageal mucosa was normal.  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  Biopsies were obtained.  Using a TTS balloon, dilation was performed with 23mm and then 16mm with mild resistance and a mucosal wrent noted.  DH noted at 40cm from the incisors, with GEJ and SCJ located 37cm from the incisors, with a 3cm hiatal hernia.  The was erythematous gastropathy noted in the antrum without focal ulceration or erosion.  The remainder of the examined stomach was normal.  Biopsies taken to rule out H pylori. The duodenal bulb and 2nd portion of the duodenum were normal. Retroflexed views revealed a hiatal hernia.     The scope was then withdrawn from the patient and the procedure completed.  COMPLICATIONS: There were no immediate complications.  ENDOSCOPIC IMPRESSION: Nonobstructive Shatski ring as described above - biopsied and dilated to 78mm with TTS balloon 3cm hiatal hernia Erythematous gastropathy in the antrum - biopsies obtained Normal duodenum  RECOMMENDATIONS: Await pathology results Avoid NSAIDs Resume  diet Resume medications Monitor response to dilation   eSigned:  Hopewell Cellar, MD 08/16/2015 8:14 AM    CC:  PATIENT NAME:  Heily, Carlucci MR#: [id number]

## 2015-08-17 ENCOUNTER — Telehealth: Payer: Self-pay | Admitting: *Deleted

## 2015-08-17 NOTE — Telephone Encounter (Signed)
  Follow up Call-  Call back number 08/16/2015  Post procedure Call Back phone  # [phone]  Permission to leave phone message Yes     Patient questions:  Do you have a fever, pain , or abdominal swelling? No. Pain Score  0 *  Have you tolerated food without any problems? Yes.    Have you been able to return to your normal activities? Yes.    Do you have any questions about your discharge instructions: Diet   No. Medications  No. Follow up visit  No.  Do you have questions or concerns about your Care? No.  Actions: * If pain score is 4 or above: No action needed, pain <4.

## 2015-10-04 ENCOUNTER — Ambulatory Visit: Payer: Medicare Other | Admitting: Cardiovascular Disease

## 2015-11-04 ENCOUNTER — Encounter: Payer: Self-pay | Admitting: Allergy and Immunology

## 2015-11-04 ENCOUNTER — Ambulatory Visit (INDEPENDENT_AMBULATORY_CARE_PROVIDER_SITE_OTHER): Payer: Medicare Other | Admitting: Allergy and Immunology

## 2015-11-04 VITALS — BP 120/78 | HR 80 | Temp 98.2°F | Resp 18

## 2015-11-04 DIAGNOSIS — R05 Cough: Secondary | ICD-10-CM

## 2015-11-04 DIAGNOSIS — R059 Cough, unspecified: Secondary | ICD-10-CM

## 2015-11-04 DIAGNOSIS — H1010 Acute atopic conjunctivitis, unspecified eye: Secondary | ICD-10-CM

## 2015-11-04 DIAGNOSIS — J309 Allergic rhinitis, unspecified: Secondary | ICD-10-CM

## 2015-11-04 MED ORDER — FLUTICASONE PROPIONATE 50 MCG/ACT NA SUSP: NASAL | Status: DC

## 2015-11-04 MED ORDER — MONTELUKAST SODIUM 5 MG PO CHEW: CHEWABLE_TABLET | ORAL | Status: DC

## 2015-11-04 MED ORDER — OLOPATADINE HCL 0.6 % NA SOLN: NASAL | Status: DC

## 2015-11-04 MED ORDER — OLOPATADINE HCL 0.7 % OP SOLN: 1.0000 [drp] | Freq: Every day | OPHTHALMIC | Status: DC | PRN

## 2015-11-04 NOTE — Progress Notes (Signed)
FOLLOW UP NOTE  RE: LESHLY MANN MRN: [id number] DOB: 04-25-1951 [facility] [place] [address] [place] [address] Date of Office Visit: 11/04/2015  Subjective:  Kara Ramirez is a 64 y.o. female who presents today for Nasal Congestion; Cough; and Medication Refill  Assessment:   1. Cough appears multifactorial, possible component of asthma, with clear lung exam.   2. Allergic rhinoconjunctivitis   3.      Patient concern for food sensitivity/intolerance. Plan:   Meds ordered this encounter  Medications  . Olopatadine HCl (PAZEO) 0.7 % SOLN    Sig: Apply 1 drop to eye daily as needed.    Dispense:  1 Bottle    Refill:  5  . Olopatadine HCl (PATANASE) 0.6 % SOLN    Sig: USE ONE SPRAY IN EACH NOSTRIL TWICE DAILY FOR STUFFY NOSE OR DRAINAGE.    Dispense:  1 Bottle    Refill:  5  . fluticasone (FLONASE) 50 MCG/ACT nasal spray    Sig: USE ONE SPRAY IN EACH NOSTRIL MIDDAY FOR CONGESTION.    Dispense:  16 g    Refill:  5  . montelukast (SINGULAIR) 5 MG chewable tablet    Sig: CHEW AND SWALLOW ONE TABLET TWICE DAILY TO PREVENT COUGH OR WHEEZE.    Dispense:  60 tablet    Refill:  5   Patient Instructions  1. Avoidance: Mold and significant weather/temperature changes. 2. Antihistamine: Claritin 10mg  by mouth once daily for runny nose or itching. 3. Nasal Spray: Patanase one spray(s) each nostril each morning and evening for stuffy nose or drainage.       Flonase one spray each nostril midday for congestion. 4. Inhalers:  Rescue: ProAir 2 puffs every 4 hours as needed for cough or wheeze.       -May use 2 puffs 10-20 minutes prior to exercise.  Preventative: QVAR 42mcg 2 puffs twice daily until symptom free then may decrease to once daily.           (Rinse, gargle, and spit out after use) 5. Other: Singulair 5mg   twice daily.       Pazeo one drop once daily as needed. 6. Nasal Saline wash prior to medicated nasal sprays listed above and as  needed through the day. 7. Follow up Visit: 4-6 months or sooner if needed.  HPI: Kara Ramirez presents to the office with intermittent congestion with ear pressure over the last 3 weeks with occasional throat clearing cough.  Denies difficulty in breathing, shortness of breath or wheeze but with persisting symptoms while in [place] with her daughter she was evaluated at the Urgent care and completed 10 days of Levaquin which was beneficial.  She has maintained on Claritin with Flonase adding Advil sinus with Saline.  She notes intermittent persisting nasal congestion and is requesting refills of medications since her last visit in our office was in April.  She denies fever, headache, sore throat or discolored drainage.  Denies ED visits or prednisone courses. Reports sleep and activity are normal.  She used ProAir last 4 days ago, no recurring use.    Current Medications: 1.  As needed QVAR, Zaditor, ProAir, saline and Advil sinus. 2.  Claritin 10mg  once daily. 3.  Flonase one spray once daily. 4.  Singulair 5mg  once daily. 5.  Continues Thiamine, Manganese, Magnesium, Coenzyme Q10, Vit D,  Calcium, Liptor, Fosamax,   Vit E, Red yeast rice and Protonix.  Drug Allergies: Allergies  Allergen Reactions  .  Codeine     REACTION: dizzy and "groggy in my head"  . Eggs Or Egg-Derived Products   . Mold Extract [Trichophyton Mentagrophyte]   . Molds & Smuts   . Morphine     REACTION: tachycardia and anxiety  . Penicillins     REACTION: rash, SOB  . Pentazocine Lactate     REACTION: same as morphine  . Wheat    Objective:   Filed Vitals:   11/04/15 1055  BP: 120/78  Pulse: 80  Temp: 98.2 F (36.8 C)  Resp: 18   SpO2 Readings from Last 1 Encounters:  11/04/15 96%   Physical Exam  Constitutional: She is well-developed, well-nourished, and in no distress.  HENT:  Head: Atraumatic.  Right Ear: Tympanic membrane and ear canal normal.  Left Ear: Tympanic membrane and ear canal normal.   Nose: Mucosal edema (minimal with scant blood tinged mucus on the left.) and rhinorrhea (minimal) present. No epistaxis.  Mouth/Throat: Oropharynx is clear and moist and mucous membranes are normal. No oropharyngeal exudate, posterior oropharyngeal edema or posterior oropharyngeal erythema.  Neck: Neck supple.  Cardiovascular: Normal rate, S1 normal and S2 normal.   No murmur heard. Pulmonary/Chest: Effort normal. She has no wheezes. She has no rhonchi. She has no rales.  Lymphadenopathy:    She has no cervical adenopathy.   Diagnostics: Spirometry: FVC 2.76--86%, FEV1 2.18--94%.    Shaquandra Galano M. Ishmael Holter, MD  cc: Florina Ou, MD

## 2015-11-04 NOTE — Patient Instructions (Addendum)
Take Home Sheet  1. Avoidance: Mold and significant weather/temperature changes.   2. Antihistamine: Claritin 10mg  by mouth once daily for runny nose or itching.   3. Nasal Spray: Patanase one spray(s) each nostril each morning and evening for stuffy nose or drainage.       Flonase one spray each nostril midday for congestion.  4. Inhalers:  Rescue: ProAir 2 puffs every 4 hours as needed for cough or wheeze.       -May use 2 puffs 10-20 minutes prior to exercise.   Preventative: QVAR 43mcg 2 puffs twice daily until symptom free then may decrease to once daily.           (Rinse, gargle, and spit out after use).    5. Other: Singulair 5mg   twice daily.       Pazeo one drop once daily as needed.  6. Nasal Saline wash prior to medicated nasal sprays listed above and as needed through the day.   7. Follow up Visit: 4-6 months or sooner if needed.   Websites that have reliable Patient information: 1. American Academy of Asthma, Allergy, & Immunology: www.aaaai.org 2. Food Allergy Network: www.foodallergy.org 3. Mothers of Asthmatics: www.aanma.org 4. [place]: DiningCalendar.de 5. American College of Allergy, Asthma, & Immunology: https://robertson.info/ or www.acaai.org   Control of Mold Allergen  Mold and fungi can grow on a variety of surfaces provided certain temperature and moisture conditions exist.  Outdoor molds grow on plants, decaying vegetation and soil.  The major outdoor mold, Alternaria dn Cladosporium, are found in very high numbers during hot and dry conditions.  Generally, a late Summer - Fall peak is seen for common outdoor fungal spores.  Rain will temporarily lower outdoor mold spore count, but counts rise rapidly when the rainy period ends.  The most important indoor molds are Aspergillus and Penicillium.  Dark, humid and poorly ventilated basements are ideal sites for mold growth.  The next most common sites of mold growth are the  bathroom and the kitchen.  Outdoor [employer] 1. Use air conditioning and keep windows closed 2. Avoid exposure to decaying vegetation. 3. Avoid leaf raking. 4. Avoid grain handling. 5. Consider wearing a face mask if working in moldy areas.  Indoor Mold Control 1. Maintain humidity below 50%. 2. Clean washable surfaces with 5% bleach solution. 3. Remove sources e.g. Contaminated carpets.

## 2015-12-01 ENCOUNTER — Other Ambulatory Visit: Payer: Self-pay | Admitting: Allergy and Immunology

## 2015-12-05 ENCOUNTER — Encounter: Payer: Self-pay | Admitting: Internal Medicine

## 2015-12-05 ENCOUNTER — Ambulatory Visit (INDEPENDENT_AMBULATORY_CARE_PROVIDER_SITE_OTHER): Payer: Medicare Other | Admitting: Internal Medicine

## 2015-12-05 ENCOUNTER — Other Ambulatory Visit (INDEPENDENT_AMBULATORY_CARE_PROVIDER_SITE_OTHER): Payer: Medicare Other

## 2015-12-05 VITALS — BP 132/86 | HR 80 | Temp 98.1°F | Resp 16 | Wt 158.0 lb

## 2015-12-05 DIAGNOSIS — Z139 Encounter for screening, unspecified: Secondary | ICD-10-CM

## 2015-12-05 DIAGNOSIS — E785 Hyperlipidemia, unspecified: Secondary | ICD-10-CM | POA: Diagnosis not present

## 2015-12-05 DIAGNOSIS — Z23 Encounter for immunization: Secondary | ICD-10-CM

## 2015-12-05 DIAGNOSIS — M810 Age-related osteoporosis without current pathological fracture: Secondary | ICD-10-CM | POA: Diagnosis not present

## 2015-12-05 LAB — CBC WITH DIFFERENTIAL/PLATELET
Basophils Absolute: 0.1 10*3/uL (ref 0.0–0.1)
Basophils Relative: 0.7 % (ref 0.0–3.0)
Eosinophils Absolute: 0.3 10*3/uL (ref 0.0–0.7)
Eosinophils Relative: 4 % (ref 0.0–5.0)
HCT: 41.5 % (ref 36.0–46.0)
Hemoglobin: 13.7 g/dL (ref 12.0–15.0)
Lymphocytes Relative: 31 % (ref 12.0–46.0)
Lymphs Abs: 2.4 10*3/uL (ref 0.7–4.0)
MCHC: 32.9 g/dL (ref 30.0–36.0)
MCV: 86.8 fl (ref 78.0–100.0)
Monocytes Absolute: 0.4 10*3/uL (ref 0.1–1.0)
Monocytes Relative: 5.6 % (ref 3.0–12.0)
Neutro Abs: 4.5 10*3/uL (ref 1.4–7.7)
Neutrophils Relative %: 58.7 % (ref 43.0–77.0)
Platelets: 348 10*3/uL (ref 150.0–400.0)
RBC: 4.79 Mil/uL (ref 3.87–5.11)
RDW: 13.7 % (ref 11.5–15.5)
WBC: 7.7 10*3/uL (ref 4.0–10.5)

## 2015-12-05 LAB — HEMOGLOBIN A1C: Hgb A1c MFr Bld: 5.9 % (ref 4.6–6.5)

## 2015-12-05 LAB — COMPREHENSIVE METABOLIC PANEL
ALT: 42 U/L — ABNORMAL HIGH (ref 0–35)
AST: 33 U/L (ref 0–37)
Albumin: 4.4 g/dL (ref 3.5–5.2)
Alkaline Phosphatase: 59 U/L (ref 39–117)
BUN: 25 mg/dL — ABNORMAL HIGH (ref 6–23)
CO2: 23 mEq/L (ref 19–32)
Calcium: 9.7 mg/dL (ref 8.4–10.5)
Chloride: 106 mEq/L (ref 96–112)
Creatinine, Ser: 0.76 mg/dL (ref 0.40–1.20)
GFR: 81.03 mL/min (ref >60.00)
Glucose, Bld: 81 mg/dL (ref 70–99)
Potassium: 4.2 mEq/L (ref 3.5–5.1)
Sodium: 144 mEq/L (ref 135–145)
Total Bilirubin: 0.5 mg/dL (ref 0.2–1.2)
Total Protein: 7.8 g/dL (ref 6.0–8.3)

## 2015-12-05 LAB — LIPID PANEL
Cholesterol: 241 mg/dL — ABNORMAL HIGH (ref 0–200)
HDL: 61.9 mg/dL (ref >39.00)
LDL Cholesterol: 153 mg/dL — ABNORMAL HIGH (ref 0–99)
NonHDL: 178.99
Total CHOL/HDL Ratio: 4
Triglycerides: 130 mg/dL (ref 0.0–149.0)
VLDL: 26 mg/dL (ref 0.0–40.0)

## 2015-12-05 LAB — TSH: TSH: 1.02 u[IU]/mL (ref 0.35–4.50)

## 2015-12-05 MED ORDER — ATORVASTATIN CALCIUM 20 MG PO TABS: 20.0000 mg | ORAL_TABLET | Freq: Every day | ORAL | Status: DC

## 2015-12-05 NOTE — Progress Notes (Signed)
Pre visit review using our clinic review tool, if applicable. No additional management support is needed unless otherwise documented below in the visit note. 

## 2015-12-05 NOTE — Patient Instructions (Addendum)
  We have reviewed your prior records including labs and tests today.  Test(s) ordered today. Your results will be released to [place] (or called to you) after review, usually within 72hours after test completion. If any changes need to be made, you will be notified at that same time.  Flu vaccine administered today.   Medications reviewed and updated . Changes include decreasing your lipitor to 20 mg daily.    Your prescription(s) have been submitted to your pharmacy. Please take as directed and contact our office if you believe you are having problem(s) with the medication(s).  Please schedule followup in 6 months for a PE

## 2015-12-05 NOTE — Assessment & Plan Note (Signed)
Feels somewhat funny taking Lipitor 40 mg daily and has not taken it for the past 2 weeks Taking red yeast Rice daily Advised her to stop the red yeast Rice Will check lipid panel today, but we realize it may not be accurate Start Lipitor 20 mg daily to see if she tolerates this better and will be more compliant with it. If she still has side effects may need to change to a different statin Regular exercise stressed Follow-up in 6 months

## 2015-12-05 NOTE — Progress Notes (Signed)
Subjective:    Patient ID: Kara Ramirez, female    DOB: 05-21-1951, 65 y.o.   MRN: [id number]  HPI She is here to establish with a new pcp.   She is having sinus infection symptoms and was recently treated.    She follows with GI, allergy, urology.    Hyperlipidemia: She has not taken the lipitor for two weeks, but was taking it consistently prior to that.. She is compliant with a low fat/cholesterol diet. She is exercising regularly - walking, but does not walk much in the winter. She is also taking red yeast Rice daily. She does feel funny when she takes the 40 mg of Lipitor and would ideally like to go off of it.  Osteoporosis:  She was started on fosamax last year.  Her gyn is following her osteoporosis and her dexa is up to date.  She is trying to exercise regularly.  She is taking calcium and vitamin D daily.  Sinus infection:  She recently took a round of levaquin and just finished the medrol dose pak.  She has been experiencing nasal congestion for a long time, possibly months. She is taking all of her allergy medication daily as prescribed. She thinks the Medrol Dosepak did help break up some of the mucus-she just finished that over the weekend.  Medications and allergies reviewed with patient and updated if appropriate.  Patient Active Problem List   Diagnosis Date Noted  . Chest pain 05/27/2015  . Hyperlipidemia 05/27/2015  . Iron deficiency anemia 08/27/2011  . Rectal bleeding 08/23/2011  . Internal hemorrhoids 08/23/2011  . Constipation 08/23/2011    Current Outpatient Prescriptions on File Prior to Visit  Medication Sig Dispense Refill  . alendronate (FOSAMAX) 70 MG tablet Take 70 mg by mouth once a week. Take with a full glass of water on an empty stomach.    . Aspirin-Acetaminophen-Caffeine (EXCEDRIN EXTRA STRENGTH PO) Take 1 tablet by mouth daily as needed.    [name] atorvastatin (LIPITOR) 40 MG tablet Take 1 tablet (40 mg total) by mouth daily. 30 tablet 11  .  AZASITE 1 % ophthalmic solution     . beclomethasone (QVAR) 40 MCG/ACT inhaler Inhale 1 puff into the lungs 2 (two) times daily.    . Calcium Carbonate-Vitamin D (CALCIUM 600+D) 600-400 MG-UNIT per tablet Take 1 tablet by mouth 2 (two) times daily.      . cholecalciferol (VITAMIN D) 1000 UNITS tablet Take 1,000 Units by mouth daily.      . Coenzyme Q10 (CO Q 10) 60 MG CAPS Take 1 tablet by mouth 2 (two) times daily.      . fluticasone (FLONASE) 50 MCG/ACT nasal spray USE ONE SPRAY IN EACH NOSTRIL MIDDAY FOR CONGESTION. 16 g 5  . ketotifen (ZADITOR) 0.025 % ophthalmic solution 1 drop 2 (two) times daily.    [name] loratadine (CLARITIN) 10 MG tablet Take 10 mg by mouth daily.      [name] MAGNESIUM SULFATE PO Take 1 tablet by mouth daily.      [name] MANGANESE PO Take 1 tablet by mouth daily.      . montelukast (SINGULAIR) 5 MG chewable tablet CHEW AND SWALLOW ONE TABLET TWICE DAILY TO PREVENT COUGH OR WHEEZE. 60 tablet 5  . naproxen sodium (ALEVE) 220 MG tablet Take 220 mg by mouth 2 (two) times daily with a meal.    . Olopatadine HCl (PATANASE) 0.6 % SOLN USE ONE SPRAY IN EACH NOSTRIL TWICE DAILY FOR STUFFY NOSE OR  DRAINAGE. 1 Bottle 5  . Olopatadine HCl (PAZEO) 0.7 % SOLN Apply 1 drop to eye daily as needed. 1 Bottle 5  . Omega-3 Fatty Acids (FISH OIL) 1000 MG CAPS Take 1 capsule by mouth daily.    . polyethylene glycol powder (GLYCOLAX/MIRALAX) powder Take 9 grams (1/2 scoop) dissolved in at least 8 ounces of water/juice three times per week. 527 g 2  . PROAIR HFA 108 (90 Base) MCG/ACT inhaler USE 2 PUFFS EVERY 4 HOURS AS NEEDED FOR COUGH OR WHEEZE. MAY USE 2 PUFFS 10-20 MINUTES PRIOR TO EXERCISE. 8.5 g 0  . Red Yeast Rice Extract (RED YEAST RICE PO) Take 1 tablet by mouth 2 (two) times daily. Reported on 11/04/2015    . Thiamine HCl (VITAMIN B-1) 100 MG tablet Take 100 mg by mouth daily.      . vitamin E 400 UNIT capsule Take 400 Units by mouth at bedtime.       No current facility-administered medications  on file prior to visit.    Past Medical History  Diagnosis Date  . IBS (irritable bowel syndrome)   . Endometriosis   . Ovarian cyst   . Nephrolithiasis   . Seasonal allergies   . Fibromyalgia   . Cystocele   . Chest pain   . Hyperlipidemia   . Anemia   . Arthritis   . Asthma   . Chronic headaches     Past Surgical History  Procedure Laterality Date  . Bladder suspension    . Vaginal prolapse repair    . Total abdominal hysterectomy    . Knee surgery Right     right x2  . Lumbar disc surgery  03/13/2011    T12-L7 PINS AND SCREWS  . Kindey stone removal    . Celiac artery anuerysym    . Cataract extraction Bilateral     Social History   Social History  . Marital Status: Married    Spouse Name: N/A  . Number of Children: 2  . Years of Education: N/A   Occupational History  . retired    Social History Main Topics  . Smoking status: Never Smoker   . Smokeless tobacco: Never Used  . Alcohol Use: No  . Drug Use: No  . Sexual Activity: Not on file   Other Topics Concern  . Not on file   Social History Narrative    Family History  Problem Relation Age of Onset  . Colon cancer Mother   . Anemia Mother     Aplastic anemia-Purpra  . Heart disease Father   . Arthritis Father   . Nephrolithiasis Father   . Heart disease Maternal Grandfather   . Heart disease Paternal Grandfather     Review of Systems  Constitutional: Positive for fever. Negative for chills.  HENT: Positive for congestion, hearing loss (from congestion), postnasal drip, sinus pressure (mild, worse last week) and sore throat. Negative for ear pain.   Respiratory: Positive for cough (mild, from PND) and shortness of breath (mild). Negative for wheezing.   Cardiovascular: Negative for chest pain, palpitations and leg swelling.  Gastrointestinal: Negative for nausea and abdominal pain.       No GERD, loose stools since yesterday - ? Related to something she ate  Genitourinary: Negative for  dysuria and hematuria.  Musculoskeletal: Positive for back pain (chronic, varies in intensity) and arthralgias (right knee pain).  Neurological: Positive for dizziness (from allergies - on occasion) and headaches.       Objective:  Filed Vitals:   12/05/15 0857  BP: 132/86  Pulse: 80  Temp: 98.1 F (36.7 C)  Resp: 16   Filed Weights   12/05/15 0857  Weight: 158 lb (71.668 kg)   Body mass index is 26.29 kg/(m^2).   Physical Exam  Constitutional: She appears well-developed and well-nourished. No distress.  HENT:  Head: Normocephalic and atraumatic.  Right Ear: External ear normal.  Left Ear: External ear normal.  Mouth/Throat: Oropharynx is clear and moist. No oropharyngeal exudate.  Normal bilateral ear canals and tympanic membranes  Eyes: Conjunctivae are normal.  Neck: Neck supple. No tracheal deviation present. No thyromegaly present.  Cardiovascular: Normal rate, regular rhythm and normal heart sounds.   No murmur heard. Pulmonary/Chest: Effort normal and breath sounds normal. No respiratory distress. She has no wheezes.  Abdominal: Soft. She exhibits no distension. There is no tenderness.  Musculoskeletal: She exhibits no edema.  Lymphadenopathy:    She has no cervical adenopathy.  Skin: Skin is warm and dry. She is not diaphoretic.  Psychiatric: She has a normal mood and affect. Her behavior is normal.          Assessment & Plan:   Sinus infection Has recently been treated with Levaquin and Medrol Dosepak Symptoms have improved, but not completely resolved Continue sinus medication Hopefully her symptoms will continue to improve over the next several days-if they do not really worsen again she will call and we will consider another round of antibiotics  See Problem List for Assessment and Plan of chronic medical problems.  Follow-up in 6 months for a physical exam   Follow up in 6 months for a PE

## 2015-12-05 NOTE — Assessment & Plan Note (Signed)
DEXA up-to-date-followed by GYN Started on Fosamax in 2016 by her gynecologist Exercising, but not always regularly Taking calcium and vitamin D daily

## 2016-02-08 ENCOUNTER — Other Ambulatory Visit: Payer: Medicare Other

## 2016-02-08 ENCOUNTER — Ambulatory Visit (INDEPENDENT_AMBULATORY_CARE_PROVIDER_SITE_OTHER): Payer: Medicare Other | Admitting: Nurse Practitioner

## 2016-02-08 ENCOUNTER — Encounter: Payer: Self-pay | Admitting: Nurse Practitioner

## 2016-02-08 VITALS — BP 120/80 | HR 85 | Temp 98.0°F | Ht 65.0 in | Wt 157.8 lb

## 2016-02-08 DIAGNOSIS — R358 Other polyuria: Secondary | ICD-10-CM

## 2016-02-08 DIAGNOSIS — J309 Allergic rhinitis, unspecified: Secondary | ICD-10-CM | POA: Diagnosis not present

## 2016-02-08 DIAGNOSIS — R3589 Other polyuria: Secondary | ICD-10-CM

## 2016-02-08 LAB — POCT URINALYSIS DIPSTICK
Bilirubin, UA: NEGATIVE
Blood, UA: NEGATIVE
Glucose, UA: NEGATIVE
Ketones, UA: NEGATIVE
Nitrite, UA: NEGATIVE
Protein, UA: NEGATIVE
Spec Grav, UA: 1.025
Urobilinogen, UA: 0.2
pH, UA: 7

## 2016-02-08 MED ORDER — METHYLPREDNISOLONE 4 MG PO TABS: ORAL_TABLET | ORAL | Status: DC

## 2016-02-08 NOTE — Progress Notes (Signed)
Pre visit review using our clinic review tool, if applicable. No additional management support is needed unless otherwise documented below in the visit note. 

## 2016-02-08 NOTE — Progress Notes (Signed)
Patient ID: Kara Ramirez, female    DOB: 1951/09/22  Age: 65 y.o. MRN: [id number]  CC: Polyuria and Sinusitis   HPI Kara Ramirez presents for CC of polyruria x and sinusitis x 1 week.   1) Polyuria-   Last week started, goes frequently 4-5 x a days, nocturia x 1- normal, has had a bladder sling, denies dysuria   2) Sinus congestion- started last week  Nagging cough  Tmax- 99.5   Flonase, claritin, and singulair Qvar- out of it  Used the proAir yesterday   Sick contacts- Denies   Saw Dr. Quay Ramirez 12/05/15 and had finished a medrol dosepak and levaquin in Dec.   History Kara Ramirez has a past medical history of IBS (irritable bowel syndrome); Endometriosis; Ovarian cyst; Nephrolithiasis; Seasonal allergies; Fibromyalgia; Cystocele; Chest pain; Hyperlipidemia; Anemia; Arthritis; Asthma; and Chronic headaches.   She has past surgical history that includes Bladder suspension; Vaginal prolapse repair; Total abdominal hysterectomy; Knee surgery (Right); Lumbar disc surgery (03/13/2011); kindey stone removal; celiac artery anuerysym (2011); and Cataract extraction (Bilateral).   Her family history includes Anemia in her mother; Arthritis in her father; Colon cancer in her mother; Heart disease in her father, maternal grandfather, and paternal grandfather; Nephrolithiasis in her father.She reports that she has never smoked. She has never used smokeless tobacco. She reports that she does not drink alcohol or use illicit drugs.  Outpatient Prescriptions Prior to Visit  Medication Sig Dispense Refill  . alendronate (FOSAMAX) 70 MG tablet Take 70 mg by mouth once a week. Take with a full glass of water on an empty stomach.    . Aspirin-Acetaminophen-Caffeine (EXCEDRIN EXTRA STRENGTH PO) Take 1 tablet by mouth daily as needed.    [name] atorvastatin (LIPITOR) 20 MG tablet Take 1 tablet (20 mg total) by mouth daily. 90 tablet 3  . AZASITE 1 % ophthalmic solution     . beclomethasone (QVAR) 40 MCG/ACT  inhaler Inhale 1 puff into the lungs 2 (two) times daily.    . Calcium Carbonate-Vitamin D (CALCIUM 600+D) 600-400 MG-UNIT per tablet Take 1 tablet by mouth 2 (two) times daily.      . cholecalciferol (VITAMIN D) 1000 UNITS tablet Take 1,000 Units by mouth daily.      . Coenzyme Q10 (CO Q 10) 60 MG CAPS Take 1 tablet by mouth 2 (two) times daily.      . fluticasone (FLONASE) 50 MCG/ACT nasal spray USE ONE SPRAY IN EACH NOSTRIL MIDDAY FOR CONGESTION. 16 g 5  . ketotifen (ZADITOR) 0.025 % ophthalmic solution 1 drop 2 (two) times daily.    [name] loratadine (CLARITIN) 10 MG tablet Take 10 mg by mouth daily.      [name] MAGNESIUM SULFATE PO Take 1 tablet by mouth daily.      [name] MANGANESE PO Take 1 tablet by mouth daily.      . montelukast (SINGULAIR) 5 MG chewable tablet CHEW AND SWALLOW ONE TABLET TWICE DAILY TO PREVENT COUGH OR WHEEZE. 60 tablet 5  . naproxen sodium (ALEVE) 220 MG tablet Take 220 mg by mouth 2 (two) times daily with a meal.    . Olopatadine HCl (PATANASE) 0.6 % SOLN USE ONE SPRAY IN EACH NOSTRIL TWICE DAILY FOR STUFFY NOSE OR DRAINAGE. 1 Bottle 5  . Omega-3 Fatty Acids (FISH OIL) 1000 MG CAPS Take 1 capsule by mouth daily.    . polyethylene glycol powder (GLYCOLAX/MIRALAX) powder Take 9 grams (1/2 scoop) dissolved in at least 8 ounces of water/juice three times per  week. 527 g 2  . PROAIR HFA 108 (90 Base) MCG/ACT inhaler USE 2 PUFFS EVERY 4 HOURS AS NEEDED FOR COUGH OR WHEEZE. MAY USE 2 PUFFS 10-20 MINUTES PRIOR TO EXERCISE. 8.5 g 0  . Red Yeast Rice Extract (RED YEAST RICE PO) Take 1 tablet by mouth 2 (two) times daily. Reported on 11/04/2015    . Thiamine HCl (VITAMIN B-1) 100 MG tablet Take 100 mg by mouth daily.      . vitamin E 400 UNIT capsule Take 400 Units by mouth at bedtime.      . Olopatadine HCl (PAZEO) 0.7 % SOLN Apply 1 drop to eye daily as needed. (Patient not taking: Reported on 02/08/2016) 1 Bottle 5   No facility-administered medications prior to visit.    ROS Review of  Systems  Constitutional: Positive for fatigue. Negative for fever, chills and diaphoresis.  HENT: Positive for congestion, postnasal drip, sinus pressure, sneezing and sore throat. Negative for rhinorrhea, tinnitus, trouble swallowing and voice change.   Eyes: Negative for visual disturbance.  Respiratory: Positive for cough. Negative for chest tightness, shortness of breath and wheezing.   Cardiovascular: Negative for chest pain, palpitations and leg swelling.  Gastrointestinal: Negative for nausea, vomiting and diarrhea.  Endocrine: Positive for polyuria.  Skin: Negative for rash.  Neurological: Negative for dizziness and headaches.    Objective:  BP 120/80 mmHg  Pulse 85  Temp(Src) 98 F (36.7 C) (Oral)  Ht 5\' 5"  (1.651 m)  Wt 157 lb 12 oz (71.555 kg)  BMI 26.25 kg/m2  SpO2 98%  Physical Exam  Constitutional: She is oriented to person, place, and time. She appears well-developed and well-nourished. No distress.  HENT:  Head: Normocephalic and atraumatic.  Right Ear: External ear normal.  Left Ear: External ear normal.  Mouth/Throat: Oropharynx is clear and moist. No oropharyngeal exudate.  TMs clear bilaterally  Eyes: EOM are normal. Pupils are equal, round, and reactive to light. Right eye exhibits no discharge. Left eye exhibits no discharge. No scleral icterus.  Neck: Normal range of motion. Neck supple.  Cardiovascular: Normal rate, regular rhythm and normal heart sounds.  Exam reveals no gallop and no friction rub.   No murmur heard. Pulmonary/Chest: Effort normal and breath sounds normal. No respiratory distress. She has no wheezes. She has no rales. She exhibits no tenderness.  Lymphadenopathy:    She has no cervical adenopathy.  Neurological: She is alert and oriented to person, place, and time.  Skin: Skin is warm and dry. No rash noted. She is not diaphoretic.  Psychiatric: She has a normal mood and affect. Her behavior is normal. Judgment and thought content  normal.   Assessment & Plan:   Kara Ramirez was seen today for polyuria and sinusitis.  Diagnoses and all orders for this visit:  Polyuria -     CULTURE, URINE COMPREHENSIVE; Future -     POCT Urinalysis Dipstick  Allergic rhinitis, unspecified allergic rhinitis type  Other orders -     methylPREDNISolone (MEDROL) 4 MG tablet; Take 6 tablets by mouth with breakfast or lunch and decrease by 1 tablet each day until gone.   I am having Kara Ramirez start on methylPREDNISolone. I am also having her maintain her loratadine, Calcium Carbonate-Vitamin D, cholecalciferol, Co Q 10, MAGNESIUM SULFATE PO, MANGANESE PO, vitamin E, thiamine, polyethylene glycol powder, beclomethasone, alendronate, ketotifen, Red Yeast Rice Extract (RED YEAST RICE PO), Aspirin-Acetaminophen-Caffeine (EXCEDRIN EXTRA STRENGTH PO), naproxen sodium, Fish Oil, AZASITE, Olopatadine HCl, fluticasone, montelukast, PROAIR HFA, and  atorvastatin.  Meds ordered this encounter  Medications  . methylPREDNISolone (MEDROL) 4 MG tablet    Sig: Take 6 tablets by mouth with breakfast or lunch and decrease by 1 tablet each day until gone.    Dispense:  21 tablet    Refill:  0    Order Specific Question:  Supervising Provider    Answer:  Crecencio Mc [[id number]]     Follow-up: Return if symptoms worsen or fail to improve.

## 2016-02-08 NOTE — Assessment & Plan Note (Signed)
New problem  Taking flonase, claritin, and singulair  Prednisone taper given for allergy/sinus symptoms that look to be not bacterial in origin.  Instructions verbal and on AVS FU prn worsening/failure to improve.

## 2016-02-08 NOTE — Assessment & Plan Note (Signed)
POCT not suggestive of UTI Will obtain culture

## 2016-02-08 NOTE — Patient Instructions (Signed)
Prednisone with breakfast or lunch at the latest.  6 tablets on day 1, 5 tablets on day 2, 4 tablets on day 3, 3 tablets on day 4, 2 tablets day 5, 1 tablet on day 6...done! Take tablets all together not spaced out Don't take with NSAIDs (Ibuprofen, Aleve, Naproxen, Meloxicam ect...)  Give it several days to work.

## 2016-02-10 LAB — CULTURE, URINE COMPREHENSIVE: Colony Count: 50000

## 2016-02-13 ENCOUNTER — Telehealth: Payer: Self-pay | Admitting: *Deleted

## 2016-02-13 NOTE — Telephone Encounter (Signed)
Patient called stating Azelatine was called in patient wants to know why? Please call back

## 2016-02-13 NOTE — Telephone Encounter (Signed)
Spoke with patient advise insurance faxed over a form, in patient's paper chart, stating that she could use a different spray due to cost. Patient upset she was not informed of the change from insurance or [place] I did apologize to patient. She states this nasal spray has more side effects but will call if any issues.

## 2016-04-03 ENCOUNTER — Ambulatory Visit: Payer: Medicare Other | Admitting: Gastroenterology

## 2016-04-04 ENCOUNTER — Ambulatory Visit: Payer: Medicare Other | Admitting: Gastroenterology

## 2016-04-10 ENCOUNTER — Encounter: Payer: Self-pay | Admitting: *Deleted

## 2016-04-11 ENCOUNTER — Encounter: Payer: Self-pay | Admitting: Gastroenterology

## 2016-04-11 ENCOUNTER — Ambulatory Visit (INDEPENDENT_AMBULATORY_CARE_PROVIDER_SITE_OTHER): Payer: Medicare Other | Admitting: Gastroenterology

## 2016-04-11 VITALS — BP 124/70 | HR 68

## 2016-04-11 DIAGNOSIS — R21 Rash and other nonspecific skin eruption: Secondary | ICD-10-CM

## 2016-04-11 DIAGNOSIS — K602 Anal fissure, unspecified: Secondary | ICD-10-CM | POA: Diagnosis not present

## 2016-04-11 DIAGNOSIS — K5900 Constipation, unspecified: Secondary | ICD-10-CM

## 2016-04-11 MED ORDER — AMBULATORY NON FORMULARY MEDICATION
Status: DC
Start: 2016-04-11 — End: 2016-11-07

## 2016-04-11 NOTE — Progress Notes (Signed)
HPI :  INTAKE VISIT: 65 y/o female here in consultation from Dr. Quay Burow for chest pain. She has been a patient of Dr. Olevia Perches, last seen 4 years ago. Here for symptoms of atypical chest pain, dysphagia, IBS, and CRC screening.  Patient recently has had some chest pain evaluated by cardiology and underwent a stress test which was negative. She thinks she had some chest pain over the past few months, starting in the Spring. She felt it in her lower chest and epigastric area. She had it several times, which lasted roughly 20 minutes at a time and then go away on its own. She reported it occurred at night and not associated with eating. Symptoms were not exertional. She does not think this bothered her recently, the last time it bothered her was August. She was started empirically on protonix by cardiology, and did not like the way she felt on it. Protonix made her nauseous and caused vomiting. She has stopped protonix given it does not make her feel well. She had no nausea or vomiting with it. She took some TUMS and it would help abate the discomfort. She otherwise does endorse some dysphagia which is longstanding. This only happens to solids. No dysphagia to liquids. No odynophagia. She has not had a upper endoscopy previously. She denies baseline nausea or vomiting.   Her last colonoscopy in 2011 and was normal. She has also had a colonoscopy in 2006 was normal. Her mother had colon cancer around age 61-60 time frame and has been having colonoscopy every 5 yrs. She endorses a remote history of colon polyps. She denies any new changes in her bowels. No blood in the stools. 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 She has used miralax previously. She has tried fiber supplements in the past which made her bloated and did not like the way she felt on them. No weight loss.   SINCE LAST VISIT:  EGD done 08/15/16 - Nonobstructive Shatski ring,  biopsied and dilated to 75mm with TTS balloon, 3cm hiatal hernia, Erythematous gastropathy in the antrum - biopsies obtained, Normal duodenum. Biopsies benign, no HP.  She reports she found benefit from the dilation since her last visit. No further dysphagia. No chest pains, this has resolved. She reports doing well in this regard with no complaints.   Colonoscopy 08/15/16 - Sessile polyp was found in the transverse colon; polypectomy was performed with a cold snare, and results c/w benign colonic mucosa. Mild diverticulosis was noted in the sigmoid colon, internal hemorrhoids  She reports some burning and discomfort in her anal area. She reports passing hard stools and having a hard time producing a bowel movement. She reports soreness and irritation in her anal area and perianal area. She has tried some topical ointments including neosporin applying it to the perianal area. She reports smyptoms have been ongoing for her for the past few weeks at least. She is not seeing blood routinely in the stools, perhaps she saw it once. She is having a BM every day but states stools are hard balls and difficult to pass. She has taken some Miralax to help soften her stool, she thinks it has helped somewhat, but using it PRN. She is trying to eat high fiber diet.      Past Medical History  Diagnosis Date  . IBS (irritable bowel syndrome)   . Endometriosis   . Ovarian cyst   . Nephrolithiasis   . Seasonal allergies   .  Fibromyalgia   . Cystocele   . Chest pain   . Hyperlipidemia   . Anemia   . Arthritis   . Asthma   . Chronic headaches   . Diverticulosis   . Irritable bowel syndrome with constipation      Past Surgical History  Procedure Laterality Date  . Bladder suspension    . Vaginal prolapse repair    . Total abdominal hysterectomy    . Knee surgery Right     right x2  . Lumbar disc surgery  03/13/2011    T12-L7 PINS AND SCREWS  . Kindey stone removal    . Celiac artery anuerysym  2011    . Cataract extraction Bilateral    Family History  Problem Relation Age of Onset  . Colon cancer Mother   . Anemia Mother     Aplastic anemia-Purpra  . Heart disease Father   . Arthritis Father   . Nephrolithiasis Father   . Heart disease Maternal Grandfather   . Heart disease Paternal Grandfather    Social History  Substance Use Topics  . Smoking status: Never Smoker   . Smokeless tobacco: Never Used  . Alcohol Use: No   Current Outpatient Prescriptions  Medication Sig Dispense Refill  . Aspirin-Acetaminophen-Caffeine (EXCEDRIN EXTRA STRENGTH PO) Take 1 tablet by mouth daily as needed.    [name] atorvastatin (LIPITOR) 20 MG tablet Take 1 tablet (20 mg total) by mouth daily. 90 tablet 3  . AZASITE 1 % ophthalmic solution     . beclomethasone (QVAR) 40 MCG/ACT inhaler Inhale 1 puff into the lungs 2 (two) times daily.    . Calcium Carbonate-Vitamin D (CALCIUM 600+D) 600-400 MG-UNIT per tablet Take 1 tablet by mouth 2 (two) times daily.      . cholecalciferol (VITAMIN D) 1000 UNITS tablet Take 1,000 Units by mouth daily.      . Coenzyme Q10 (CO Q 10) 60 MG CAPS Take 1 tablet by mouth 2 (two) times daily.      . fluticasone (FLONASE) 50 MCG/ACT nasal spray USE ONE SPRAY IN EACH NOSTRIL MIDDAY FOR CONGESTION. 16 g 5  . ketotifen (ZADITOR) 0.025 % ophthalmic solution 1 drop 2 (two) times daily.    [name] loratadine (CLARITIN) 10 MG tablet Take 10 mg by mouth daily.      [name] MAGNESIUM SULFATE PO Take 1 tablet by mouth daily.      [name] MANGANESE PO Take 1 tablet by mouth daily.      . montelukast (SINGULAIR) 5 MG chewable tablet CHEW AND SWALLOW ONE TABLET TWICE DAILY TO PREVENT COUGH OR WHEEZE. 60 tablet 5  . naproxen sodium (ALEVE) 220 MG tablet Take 220 mg by mouth daily.     . Olopatadine HCl (PATANASE) 0.6 % SOLN USE ONE SPRAY IN EACH NOSTRIL TWICE DAILY FOR STUFFY NOSE OR DRAINAGE. (Patient taking differently: USE ONE SPRAY IN EACH NOSTRIL TWICE DAILY FOR STUFFY NOSE OR DRAINAGE as needed) 1  Bottle 5  . Omega-3 Fatty Acids (FISH OIL) 1000 MG CAPS Take 1 capsule by mouth daily.    . polyethylene glycol powder (GLYCOLAX/MIRALAX) powder Take 9 grams (1/2 scoop) dissolved in at least 8 ounces of water/juice three times per week. 527 g 2  . PROAIR HFA 108 (90 Base) MCG/ACT inhaler USE 2 PUFFS EVERY 4 HOURS AS NEEDED FOR COUGH OR WHEEZE. MAY USE 2 PUFFS 10-20 MINUTES PRIOR TO EXERCISE. 8.5 g 0  . Thiamine HCl (VITAMIN B-1) 100 MG tablet Take 100 mg by  mouth daily.      . vitamin E 400 UNIT capsule Take 400 Units by mouth at bedtime.      [name] alendronate (FOSAMAX) 70 MG tablet Take 70 mg by mouth once a week. Reported on 04/11/2016    . AMBULATORY NON FORMULARY MEDICATION Medication Name: Nitroglycerin 0.125%- Apply a pea sized amount to rectum three times daily x 6 weeks. 30 g 0   No current facility-administered medications for this visit.   Allergies  Allergen Reactions  . Codeine     REACTION: dizzy and "groggy in my head"  . Eggs Or Egg-Derived Products   . Mold Extract [Trichophyton Mentagrophyte]   . Molds & Smuts   . Morphine     REACTION: tachycardia and anxiety  . Penicillins     REACTION: rash, SOB  . Pentazocine Lactate     REACTION: same as morphine  . Protonix [Pantoprazole Sodium] Nausea And Vomiting  . Wheat      Review of Systems: All systems reviewed and negative except where noted in HPI.   Lab Results  Component Value Date   WBC 7.7 12/05/2015   HGB 13.7 12/05/2015   HCT 41.5 12/05/2015   MCV 86.8 12/05/2015   PLT 348.0 12/05/2015      Physical Exam: BP 124/70 mmHg  Pulse 68 Constitutional: Pleasant,well-developed, female in no acute distress. HEENT: Normocephalic and atraumatic. Conjunctivae are normal. No scleral icterus. Neck supple.  Cardiovascular: Normal rate, regular rhythm.  Pulmonary/chest: Effort normal and breath sounds normal. No wheezing, rales or rhonchi. Abdominal: Soft, nondistended, nontender. Bowel sounds active throughout.  There are no masses palpable. No hepatomegaly. DRE / Anoscopy: posterior midline small anal fissure, internal hemorrhoids on anoscopy, no external hemorrhoids, perianal rash macerated with mild erythema, c/w dermatitis Extremities: no edema Lymphadenopathy: No cervical adenopathy noted. Neurological: Alert and oriented to person place and time. Skin: Skin is warm and dry. Psychiatric: Normal mood and affect. Behavior is normal.   ASSESSMENT AND PLAN: 65 y/o female here for follow up today with the following issues:  Anal fissure / perianal rash - recommend nitroglycerin ointment q 8 hrs for anal fissure, suspect this is driving her discomfort in the setting of constipation. Hemorrhoids seem less likely to be the cause of her symptoms although they were noted. Recommend using miralax daily to keep stools soft. Otherwise unclear if she has a secondary reactive dermatitis in the perianal area in light of her use of neosporin to the area or what is causing the dermatitis, but recommend she stop neosporin. She can try using zinc oxide / diaper rash cream as a barrier initially to see if this helps. If it persists she should try some hydrocortisone ointment OTC. If these measures do not improve her rash, may consider dermatology evaluation.  Constipation - recommend miralax daily and titrate to effect  Dysphagia - resolved s/p dilation of Shatski ring, she can follow up as needed for this. Chest pain has intervaly resolved, was likely due to reflux. Follow up PRN  Balta Cellar, MD [facility] Pager [phone]

## 2016-04-11 NOTE — Patient Instructions (Signed)
Please purchase the following medications over the counter and take as directed: Zinc Oxide cream (diaper rash cream) to rectal area. Hydrocortisone 1% cream (if zinc oxide is not effective)  We have sent a prescription for nitroglycerin 0.125% gel to [facility]. You should apply a pea size amount to your rectum three times daily x 6-8 weeks.  [facility]'s information is below: Address: [address], [place], [place] [address]  [phone]  If you are age 41 or older, your body mass index should be between 23-30. Your There is no weight on file to calculate BMI. If this is out of the aforementioned range listed, please consider follow up with your Primary Care Provider.  If you are age 75 or younger, your body mass index should be between 19-25. Your There is no weight on file to calculate BMI. If this is out of the aformentioned range listed, please consider follow up with your Primary Care Provider.

## 2016-05-17 ENCOUNTER — Ambulatory Visit: Payer: Medicare Other | Admitting: Gastroenterology

## 2016-06-04 ENCOUNTER — Ambulatory Visit: Payer: Medicare Other | Admitting: Internal Medicine

## 2016-06-06 ENCOUNTER — Other Ambulatory Visit (INDEPENDENT_AMBULATORY_CARE_PROVIDER_SITE_OTHER): Payer: Medicare Other

## 2016-06-06 ENCOUNTER — Encounter: Payer: Self-pay | Admitting: Internal Medicine

## 2016-06-06 ENCOUNTER — Ambulatory Visit (INDEPENDENT_AMBULATORY_CARE_PROVIDER_SITE_OTHER): Payer: Medicare Other | Admitting: Internal Medicine

## 2016-06-06 VITALS — BP 134/84 | HR 71 | Temp 98.3°F | Resp 16 | Wt 156.0 lb

## 2016-06-06 DIAGNOSIS — R7303 Prediabetes: Secondary | ICD-10-CM

## 2016-06-06 DIAGNOSIS — R05 Cough: Secondary | ICD-10-CM | POA: Diagnosis not present

## 2016-06-06 DIAGNOSIS — Z1159 Encounter for screening for other viral diseases: Secondary | ICD-10-CM | POA: Diagnosis not present

## 2016-06-06 DIAGNOSIS — E785 Hyperlipidemia, unspecified: Secondary | ICD-10-CM | POA: Diagnosis not present

## 2016-06-06 DIAGNOSIS — R059 Cough, unspecified: Secondary | ICD-10-CM | POA: Insufficient documentation

## 2016-06-06 LAB — COMPREHENSIVE METABOLIC PANEL
ALT: 35 U/L (ref 0–35)
AST: 28 U/L (ref 0–37)
Albumin: 4.6 g/dL (ref 3.5–5.2)
Alkaline Phosphatase: 57 U/L (ref 39–117)
BUN: 21 mg/dL (ref 6–23)
CO2: 27 mEq/L (ref 19–32)
Calcium: 9.8 mg/dL (ref 8.4–10.5)
Chloride: 106 mEq/L (ref 96–112)
Creatinine, Ser: 0.79 mg/dL (ref 0.40–1.20)
GFR: 77.36 mL/min (ref >60.00)
Glucose, Bld: 89 mg/dL (ref 70–99)
Potassium: 3.9 mEq/L (ref 3.5–5.1)
Sodium: 141 mEq/L (ref 135–145)
Total Bilirubin: 0.7 mg/dL (ref 0.2–1.2)
Total Protein: 7.5 g/dL (ref 6.0–8.3)

## 2016-06-06 LAB — LIPID PANEL
Cholesterol: 153 mg/dL (ref 0–200)
HDL: 58.7 mg/dL (ref >39.00)
LDL Cholesterol: 78 mg/dL (ref 0–99)
NonHDL: 94.3
Total CHOL/HDL Ratio: 3
Triglycerides: 83 mg/dL (ref 0.0–149.0)
VLDL: 16.6 mg/dL (ref 0.0–40.0)

## 2016-06-06 LAB — HEMOGLOBIN A1C: Hgb A1c MFr Bld: 5.6 % (ref 4.6–6.5)

## 2016-06-06 NOTE — Patient Instructions (Addendum)
  Test(s) ordered today. Your results will be released to MyChart (or called to you) after review, usually within 72hours after test completion. If any changes need to be made, you will be notified at that same time.  Medications reviewed and updated.  No changes recommended at this time.    Please followup in 6 months   

## 2016-06-06 NOTE — Progress Notes (Signed)
Pre visit review using our clinic review tool, if applicable. No additional management support is needed unless otherwise documented below in the visit note. 

## 2016-06-06 NOTE — Assessment & Plan Note (Signed)
Check a1c 

## 2016-06-06 NOTE — Progress Notes (Signed)
Subjective:    Patient ID: Kara Ramirez, female    DOB: 1951-03-07, 65 y.o.   MRN: [id number]  HPI She is here for follow up.  Hyperlipidemia: She did not tolerate the lipitor 40 mg daily and we changed her to 20 mg daily. She is taking her medication daily and denies side effects. She is compliant with a low fat/cholesterol diet. She is exercising regularly - walking. She denies myalgias.   Prediabetes:  She is compliant with a low sugar/carbohydrate diet.  She is exercising regularly - walking.  She still struggles with her allergies and follows with an allergist.    Cough:  She has a chronic cough.  She takes her allergy medication daily.  She tried taking protonix, but did not tolerate it. She has not tried zantac or pepcid.  She denies GERD.   Medications and allergies reviewed with patient and updated if appropriate.  Patient Active Problem List   Diagnosis Date Noted  . Allergic rhinitis 02/08/2016  . Osteoporosis 12/05/2015  . Chest pain 05/27/2015  . Hyperlipidemia 05/27/2015  . Iron deficiency anemia 08/27/2011  . Rectal bleeding 08/23/2011  . Internal hemorrhoids 08/23/2011  . Constipation 08/23/2011    Current Outpatient Prescriptions on File Prior to Visit  Medication Sig Dispense Refill  . alendronate (FOSAMAX) 70 MG tablet Take 70 mg by mouth once a week. Reported on 04/11/2016    . AMBULATORY NON FORMULARY MEDICATION Medication Name: Nitroglycerin 0.125%- Apply a pea sized amount to rectum three times daily x 6 weeks. 30 g 0  . Aspirin-Acetaminophen-Caffeine (EXCEDRIN EXTRA STRENGTH PO) Take 1 tablet by mouth daily as needed.    [name] atorvastatin (LIPITOR) 20 MG tablet Take 1 tablet (20 mg total) by mouth daily. 90 tablet 3  . AZASITE 1 % ophthalmic solution     . beclomethasone (QVAR) 40 MCG/ACT inhaler Inhale 1 puff into the lungs 2 (two) times daily.    . Calcium Carbonate-Vitamin D (CALCIUM 600+D) 600-400 MG-UNIT per tablet Take 1 tablet by mouth 2 (two)  times daily.      . cholecalciferol (VITAMIN D) 1000 UNITS tablet Take 1,000 Units by mouth daily.      . Coenzyme Q10 (CO Q 10) 60 MG CAPS Take 1 tablet by mouth 2 (two) times daily.      . fluticasone (FLONASE) 50 MCG/ACT nasal spray USE ONE SPRAY IN EACH NOSTRIL MIDDAY FOR CONGESTION. 16 g 5  . ketotifen (ZADITOR) 0.025 % ophthalmic solution 1 drop 2 (two) times daily.    [name] loratadine (CLARITIN) 10 MG tablet Take 10 mg by mouth daily.      [name] MAGNESIUM SULFATE PO Take 1 tablet by mouth daily.      [name] MANGANESE PO Take 1 tablet by mouth daily.      . montelukast (SINGULAIR) 5 MG chewable tablet CHEW AND SWALLOW ONE TABLET TWICE DAILY TO PREVENT COUGH OR WHEEZE. 60 tablet 5  . naproxen sodium (ALEVE) 220 MG tablet Take 220 mg by mouth daily.     . Olopatadine HCl (PATANASE) 0.6 % SOLN USE ONE SPRAY IN EACH NOSTRIL TWICE DAILY FOR STUFFY NOSE OR DRAINAGE. (Patient taking differently: USE ONE SPRAY IN EACH NOSTRIL TWICE DAILY FOR STUFFY NOSE OR DRAINAGE as needed) 1 Bottle 5  . Omega-3 Fatty Acids (FISH OIL) 1000 MG CAPS Take 1 capsule by mouth daily.    . polyethylene glycol powder (GLYCOLAX/MIRALAX) powder Take 9 grams (1/2 scoop) dissolved in at least 8 ounces of  water/juice three times per week. 527 g 2  . PROAIR HFA 108 (90 Base) MCG/ACT inhaler USE 2 PUFFS EVERY 4 HOURS AS NEEDED FOR COUGH OR WHEEZE. MAY USE 2 PUFFS 10-20 MINUTES PRIOR TO EXERCISE. 8.5 g 0  . Thiamine HCl (VITAMIN B-1) 100 MG tablet Take 100 mg by mouth daily.      . vitamin E 400 UNIT capsule Take 400 Units by mouth at bedtime.       No current facility-administered medications on file prior to visit.    Past Medical History  Diagnosis Date  . IBS (irritable bowel syndrome)   . Endometriosis   . Ovarian cyst   . Nephrolithiasis   . Seasonal allergies   . Fibromyalgia   . Cystocele   . Chest pain   . Hyperlipidemia   . Anemia   . Arthritis   . Asthma   . Chronic headaches   . Diverticulosis   . Irritable bowel  syndrome with constipation     Past Surgical History  Procedure Laterality Date  . Bladder suspension    . Vaginal prolapse repair    . Total abdominal hysterectomy    . Knee surgery Right     right x2  . Lumbar disc surgery  03/13/2011    T12-L7 PINS AND SCREWS  . Kindey stone removal    . Celiac artery anuerysym  2011  . Cataract extraction Bilateral     Social History   Social History  . Marital Status: Married    Spouse Name: N/A  . Number of Children: 2  . Years of Education: N/A   Occupational History  . retired    Social History Main Topics  . Smoking status: Never Smoker   . Smokeless tobacco: Never Used  . Alcohol Use: No  . Drug Use: No  . Sexual Activity: Not Asked   Other Topics Concern  . None   Social History Narrative    Family History  Problem Relation Age of Onset  . Colon cancer Mother   . Anemia Mother     Aplastic anemia-Purpra  . Heart disease Father   . Arthritis Father   . Nephrolithiasis Father   . Heart disease Maternal Grandfather   . Heart disease Paternal Grandfather     Review of Systems  Constitutional: Negative for fever.  Respiratory: Positive for cough (allergies). Negative for shortness of breath and wheezing.   Cardiovascular: Negative for chest pain, palpitations and leg swelling.  Gastrointestinal:       No gerd  Musculoskeletal: Negative for myalgias.  Neurological: Positive for headaches. Negative for light-headedness.       Objective:   Filed Vitals:   06/06/16 0946  BP: 134/84  Pulse: 71  Temp: 98.3 F (36.8 C)  Resp: 16   Filed Weights   06/06/16 0946  Weight: 156 lb (70.761 kg)   Body mass index is 25.96 kg/(m^2).   Physical Exam Constitutional: Appears well-developed and well-nourished. No distress.  Neck: Neck supple. No tracheal deviation present. No thyromegaly present.  No carotid bruit. No cervical adenopathy.   Cardiovascular: Normal rate, regular rhythm and normal heart sounds.   No  murmur heard.  No edema Pulmonary/Chest: Effort normal and breath sounds normal. No respiratory distress. No wheezes.       Assessment & Plan:   See Problem List for Assessment and Plan of chronic medical problems.

## 2016-06-06 NOTE — Assessment & Plan Note (Signed)
Dry, chronic ? Allergies and/ or silent GERD EGD in the past has shown some irritation - did not tolerate protonix Start zantac or pepcid - take for at least 4 weeks Continue allergy medication

## 2016-06-06 NOTE — Assessment & Plan Note (Signed)
She is tolerating the 20 mg of atorvastatin Recheck lipid panel

## 2016-06-07 LAB — HEPATITIS C ANTIBODY: HCV Ab: NEGATIVE

## 2016-08-07 ENCOUNTER — Telehealth: Payer: Self-pay | Admitting: Gastroenterology

## 2016-08-08 NOTE — Telephone Encounter (Signed)
Left message for patient to call back  

## 2016-08-08 NOTE — Telephone Encounter (Signed)
Patient will come in tomorrow at 9:00

## 2016-08-09 ENCOUNTER — Encounter: Payer: Self-pay | Admitting: Gastroenterology

## 2016-08-09 ENCOUNTER — Ambulatory Visit (INDEPENDENT_AMBULATORY_CARE_PROVIDER_SITE_OTHER): Payer: Medicare Other | Admitting: Gastroenterology

## 2016-08-09 ENCOUNTER — Encounter (INDEPENDENT_AMBULATORY_CARE_PROVIDER_SITE_OTHER): Payer: Self-pay

## 2016-08-09 VITALS — BP 124/82 | HR 80 | Ht 65.5 in | Wt 153.5 lb

## 2016-08-09 DIAGNOSIS — K602 Anal fissure, unspecified: Secondary | ICD-10-CM | POA: Diagnosis not present

## 2016-08-09 DIAGNOSIS — R21 Rash and other nonspecific skin eruption: Secondary | ICD-10-CM | POA: Diagnosis not present

## 2016-08-09 MED ORDER — DILTIAZEM GEL 2 %: CUTANEOUS | 0 refills | Status: DC

## 2016-08-09 NOTE — Progress Notes (Signed)
HPI :  65 y/o female here for follow up today. She was last seen in May at which time she had an anal fissure and a perianal rash causing her symptoms, as well as internal hemorrhoids. She was given topical nitroglycerin every 8 hrs. Recommended miralax to keep stools soft. Told her to stop neosporin use, with trial of zinc oxide or hydrocortizone ointment.   She reports the nitroglycerin ointment caused headaches but she did use it. She thinks it helped her symptoms but she stopped taking it. She reported using the creams - hydrocortizone and zinc oxide for her perianal rash, which does help but has not resolved it. She has a lot of burning in her perianal area from the rash during the day. She has some periodic rectal pain, and it is painful to pass stools. She is not seeing much blood in her stools. She denies constipation at present time. If she takes miralax she thinks it will help her symptoms.  .   Colonoscopy 08/16/15 - Sessile polyp was found in the transverse colon; polypectomy was performed with a cold snare, and results c/w benign colonic mucosa. Mild diverticulosis was noted in the sigmoid colon, internal hemorrhoids  EGD done 08/16/15 - Nonobstructive Shatski ring, biopsied and dilated to 62mm with TTS balloon, 3cm hiatal hernia, Erythematous gastropathy in the antrum - biopsies obtained, Normal duodenum. Biopsies benign, no HP.   Past Medical History:  Diagnosis Date  . Anemia   . Arthritis   . Asthma   . Chest pain   . Chronic headaches   . Cystocele   . Diverticulosis   . Endometriosis   . Fibromyalgia   . Hyperlipidemia   . IBS (irritable bowel syndrome)   . Irritable bowel syndrome with constipation   . Nephrolithiasis   . Ovarian cyst   . Seasonal allergies      Past Surgical History:  Procedure Laterality Date  . BLADDER SUSPENSION    . CATARACT EXTRACTION Bilateral   . celiac artery anuerysym  2011  . kindey stone removal    . KNEE SURGERY Right    right  x2  . LUMBAR DISC SURGERY  03/13/2011   T12-L7 PINS AND SCREWS  . TOTAL ABDOMINAL HYSTERECTOMY    . VAGINAL PROLAPSE REPAIR     Family History  Problem Relation Age of Onset  . Colon cancer Mother   . Anemia Mother     Aplastic anemia-Purpra  . Heart disease Father   . Arthritis Father   . Nephrolithiasis Father   . Heart disease Maternal Grandfather   . Heart disease Paternal Grandfather    Social History  Substance Use Topics  . Smoking status: Never Smoker  . Smokeless tobacco: Never Used  . Alcohol use No   Current Outpatient Prescriptions  Medication Sig Dispense Refill  . alendronate (FOSAMAX) 70 MG tablet Take 70 mg by mouth once a week. Reported on 04/11/2016    . AMBULATORY NON FORMULARY MEDICATION Medication Name: Nitroglycerin 0.125%- Apply a pea sized amount to rectum three times daily x 6 weeks. 30 g 0  . Aspirin-Acetaminophen-Caffeine (EXCEDRIN EXTRA STRENGTH PO) Take 1 tablet by mouth daily as needed.    [name] atorvastatin (LIPITOR) 20 MG tablet Take 1 tablet (20 mg total) by mouth daily. 90 tablet 3  . AZASITE 1 % ophthalmic solution     . beclomethasone (QVAR) 40 MCG/ACT inhaler Inhale 1 puff into the lungs 2 (two) times daily.    . Calcium Carbonate-Vitamin D (  CALCIUM 600+D) 600-400 MG-UNIT per tablet Take 1 tablet by mouth 2 (two) times daily.      . cholecalciferol (VITAMIN D) 1000 UNITS tablet Take 1,000 Units by mouth daily.      . Coenzyme Q10 (CO Q 10) 60 MG CAPS Take 1 tablet by mouth 2 (two) times daily.      . Estradiol 10 MCG TABS vaginal tablet Place vaginally. Monday and Thursday    . fluticasone (FLONASE) 50 MCG/ACT nasal spray USE ONE SPRAY IN EACH NOSTRIL MIDDAY FOR CONGESTION. 16 g 5  . ketotifen (ZADITOR) 0.025 % ophthalmic solution 1 drop 2 (two) times daily.    [name] loratadine (CLARITIN) 10 MG tablet Take 10 mg by mouth daily.      [name] MAGNESIUM SULFATE PO Take 1 tablet by mouth daily.      [name] MANGANESE PO Take 1 tablet by mouth daily.      .  montelukast (SINGULAIR) 5 MG chewable tablet CHEW AND SWALLOW ONE TABLET TWICE DAILY TO PREVENT COUGH OR WHEEZE. 60 tablet 5  . naproxen sodium (ALEVE) 220 MG tablet Take 220 mg by mouth daily.     . Olopatadine HCl (PATANASE) 0.6 % SOLN USE ONE SPRAY IN EACH NOSTRIL TWICE DAILY FOR STUFFY NOSE OR DRAINAGE. (Patient taking differently: USE ONE SPRAY IN EACH NOSTRIL TWICE DAILY FOR STUFFY NOSE OR DRAINAGE as needed) 1 Bottle 5  . Omega-3 Fatty Acids (FISH OIL) 1000 MG CAPS Take 1 capsule by mouth daily.    . polyethylene glycol powder (GLYCOLAX/MIRALAX) powder Take 9 grams (1/2 scoop) dissolved in at least 8 ounces of water/juice three times per week. 527 g 2  . PROAIR HFA 108 (90 Base) MCG/ACT inhaler USE 2 PUFFS EVERY 4 HOURS AS NEEDED FOR COUGH OR WHEEZE. MAY USE 2 PUFFS 10-20 MINUTES PRIOR TO EXERCISE. 8.5 g 0  . Thiamine HCl (VITAMIN B-1) 100 MG tablet Take 100 mg by mouth daily.      . vitamin E 400 UNIT capsule Take 400 Units by mouth at bedtime.      [name] diltiazem 2 % GEL Apply to rectal area 2-3 times daily 30 g 0   No current facility-administered medications for this visit.    Allergies  Allergen Reactions  . Codeine     REACTION: dizzy and "groggy in my head"  . Eggs Or Egg-Derived Products   . Mold Extract [Trichophyton Mentagrophyte]   . Molds & Smuts   . Morphine     REACTION: tachycardia and anxiety  . Penicillins     REACTION: rash, SOB  . Pentazocine Lactate     REACTION: same as morphine  . Protonix [Pantoprazole Sodium] Nausea And Vomiting  . Wheat      Review of Systems: All systems reviewed and negative except where noted in HPI.   Lab Results  Component Value Date   WBC 7.7 12/05/2015   HGB 13.7 12/05/2015   HCT 41.5 12/05/2015   MCV 86.8 12/05/2015   PLT 348.0 12/05/2015    Lab Results  Component Value Date   CREATININE 0.79 06/06/2016   BUN 21 06/06/2016   NA 141 06/06/2016   K 3.9 06/06/2016   CL 106 06/06/2016   CO2 27 06/06/2016    Lab  Results  Component Value Date   ALT 35 06/06/2016   AST 28 06/06/2016   ALKPHOS 57 06/06/2016   BILITOT 0.7 06/06/2016     Physical Exam: BP 124/82   Pulse 80   Ht  5' 5.5" (1.664 m)   Wt 153 lb 8 oz (69.6 kg)   BMI 25.16 kg/m  Constitutional: Pleasant,well-developed, female in no acute distress, using cane to ambulate HEENT: Normocephalic and atraumatic. Conjunctivae are normal. No scleral icterus. Neck supple.  Cardiovascular: Normal rate, regular rhythm.  Pulmonary/chest: Effort normal and breath sounds normal. No wheezing, rales or rhonchi. Abdominal: Soft, nondistended, nontender. There are no masses palpable. No hepatomegaly. DRE - small anterior midline anal fissure, perianal dermatitis - improved from previous Extremities: no edema Lymphadenopathy: No cervical adenopathy noted. Neurological: Alert and oriented to person place and time. Skin: Skin is warm and dry. No rashes noted. Psychiatric: Normal mood and affect. Behavior is normal.   ASSESSMENT AND PLAN: 65 y/o female here for follow up of perianal complaints as described above  Anal fissure - a different fissure is appreciated on today's exam, small. Prior fissure has since healed. Will give her some diltiazem ointment to use a few times per day as she had headaches from nitroglycerin. If this persist she should follow up for reassessment.   Perianal rash - improved on hydrocortizone PRN and zinc oxide as barrier, but persists and still causing symptoms. Will refer to dermatology at this point for guidance on long term management, hesitant to use high potency steroid in this area but will await their evaluation.   [place] Cellar, MD [facility] Gastroenterology Pager [phone]

## 2016-08-09 NOTE — Patient Instructions (Addendum)
We have sent a prescription for diltiazem gel 2% to [facility]. You should apply a small amount to your rectum 2-3 times daily  [facility] information is below: Address: [address] [place], [place] [address]  [phone]   You have been scheduled for an appointment with Dr Elvera Lennox on 10/09/16. Please arrive at 11:30 am for an 11:50 am appointment. Address: [address], [place],  [address]  Phone: [phone]  If you are age 77 or older, your body mass index should be between 23-30. Your Body mass index is 25.16 kg/m. If this is out of the aforementioned range listed, please consider follow up with your Primary Care Provider.  If you are age 33 or younger, your body mass index should be between 19-25. Your Body mass index is 25.16 kg/m. If this is out of the aformentioned range listed, please consider follow up with your Primary Care Provider.

## 2016-09-07 ENCOUNTER — Other Ambulatory Visit: Payer: Self-pay | Admitting: Internal Medicine

## 2016-10-05 ENCOUNTER — Ambulatory Visit: Payer: Medicare Other | Admitting: Gastroenterology

## 2016-11-07 ENCOUNTER — Other Ambulatory Visit: Payer: Self-pay

## 2016-11-07 ENCOUNTER — Ambulatory Visit (INDEPENDENT_AMBULATORY_CARE_PROVIDER_SITE_OTHER): Payer: Medicare Other | Admitting: Allergy

## 2016-11-07 ENCOUNTER — Encounter (INDEPENDENT_AMBULATORY_CARE_PROVIDER_SITE_OTHER): Payer: Self-pay

## 2016-11-07 ENCOUNTER — Encounter: Payer: Self-pay | Admitting: Allergy

## 2016-11-07 VITALS — BP 118/72 | HR 88 | Temp 98.4°F | Resp 19 | Ht 64.5 in | Wt 148.8 lb

## 2016-11-07 DIAGNOSIS — J4530 Mild persistent asthma, uncomplicated: Secondary | ICD-10-CM

## 2016-11-07 DIAGNOSIS — H1010 Acute atopic conjunctivitis, unspecified eye: Secondary | ICD-10-CM | POA: Diagnosis not present

## 2016-11-07 DIAGNOSIS — J309 Allergic rhinitis, unspecified: Secondary | ICD-10-CM

## 2016-11-07 DIAGNOSIS — K9049 Malabsorption due to intolerance, not elsewhere classified: Secondary | ICD-10-CM

## 2016-11-07 MED ORDER — FLUTICASONE PROPIONATE 50 MCG/ACT NA SUSP
NASAL | 5 refills | Status: DC
Start: 2016-11-07 — End: 2017-10-21

## 2016-11-07 MED ORDER — MONTELUKAST SODIUM 5 MG PO CHEW: CHEWABLE_TABLET | ORAL | 5 refills | Status: DC

## 2016-11-07 MED ORDER — OLOPATADINE HCL 0.6 % NA SOLN: NASAL | 5 refills | Status: DC

## 2016-11-07 MED ORDER — BECLOMETHASONE DIPROPIONATE 40 MCG/ACT IN AERS: 2.0000 | INHALATION_SPRAY | Freq: Two times a day (BID) | RESPIRATORY_TRACT | 5 refills | Status: DC

## 2016-11-07 NOTE — Progress Notes (Signed)
Follow-up Note  RE: Kara Ramirez MRN: [id number] DOB: 10-Mar-1951 Date of Office Visit: 11/07/2016   History of present illness: Kara Ramirez is a 65 y.o. female presenting today for follow-up of cough with possible asthma component as well as allergic rhinoconjunctivitis and food sensitivity/intolerance. She was last seen in our office by Dr. Ishmael Holter on 10/05/2015. She reports that since October of this year she has been having head and ears that feel 'stopped up' and congested as wells "clogged nose".   She uses saline spray followed by flonase 1 spray each nostril daily.   She also has patanase that she uses on occasion that she uses one spray each nostril daily.    She has also been having a cough with occasional wheezing.  She has gone to UC in [place] twice since October.   She ran out of Qvar around the summer.  She did feel her breathing was much improved when she was on Qvar.  She takes singulair 5mg  twice a day but needs refill on singulair.  She reports singulair 10mg  was less effective as taking 5mg  twice a day.      She also reports several food sensitivities including wheat and egg both of which she avoids.   She was diagnosed with lichen sclerosus by her dermatologist and is using a cream lidex cream.    She last had allergy skin testing in the 2014 that was positive for grasses and molds.    Review of systems: Review of Systems  Constitutional: Negative for chills, fever and malaise/fatigue.  HENT: Positive for congestion and sinus pain. Negative for nosebleeds and sore throat.   Eyes: Negative for discharge and redness.  Respiratory: Positive for cough and wheezing. Negative for shortness of breath.   Cardiovascular: Negative for chest pain.  Gastrointestinal: Negative for heartburn, nausea and vomiting.  Skin: Positive for rash. Negative for itching.    All other systems negative unless noted above in HPI  Past medical/social/surgical/family history have been  reviewed and are unchanged unless specifically indicated below.  No changes  Medication List: Medication List       Accurate as of 11/07/16 11:54 AM. Always use your most recent med list.          ALEVE 220 MG tablet Generic drug:  naproxen sodium Take 220 mg by mouth daily.   atorvastatin 20 MG tablet Commonly known as:  LIPITOR TAKE 1 TABLET BY MOUTH, ONCE DAILY.   AZASITE 1 % ophthalmic solution Generic drug:  azithromycin   beclomethasone 40 MCG/ACT inhaler Commonly known as:  QVAR Inhale 1 puff into the lungs 2 (two) times daily.   CALCIUM 600+D 600-400 MG-UNIT tablet Generic drug:  Calcium Carbonate-Vitamin D Take 1 tablet by mouth 2 (two) times daily.   cholecalciferol 1000 units tablet Commonly known as:  VITAMIN D Take 1,000 Units by mouth daily.   Co Q 10 60 MG Caps Take 1 tablet by mouth 2 (two) times daily.   diltiazem 2 % Gel Apply to rectal area 2-3 times daily   Fish Oil 1000 MG Caps Take 1 capsule by mouth daily.   fluocinonide ointment 0.05 % Commonly known as:  LIDEX Apply [id number] application topically 2 (two) times daily.   fluticasone 50 MCG/ACT nasal spray Commonly known as:  FLONASE USE ONE SPRAY IN EACH NOSTRIL MIDDAY FOR CONGESTION.   ketotifen 0.025 % ophthalmic solution Commonly known as:  ZADITOR 1 drop 2 (two) times daily.   loratadine 10 MG  tablet Commonly known as:  CLARITIN Take 10 mg by mouth daily.   MAGNESIUM SULFATE PO Take 1 tablet by mouth daily.   MANGANESE PO Take 1 tablet by mouth daily.   montelukast 5 MG chewable tablet Commonly known as:  SINGULAIR CHEW AND SWALLOW ONE TABLET TWICE DAILY TO PREVENT COUGH OR WHEEZE.   Olopatadine HCl 0.6 % Soln Commonly known as:  PATANASE USE ONE SPRAY IN EACH NOSTRIL TWICE DAILY FOR STUFFY NOSE OR DRAINAGE.   polyethylene glycol powder powder Commonly known as:  GLYCOLAX/MIRALAX Take 9 grams (1/2 scoop) dissolved in at least 8 ounces of water/juice three times per  week.   PROAIR HFA 108 (90 Base) MCG/ACT inhaler Generic drug:  albuterol USE 2 PUFFS EVERY 4 HOURS AS NEEDED FOR COUGH OR WHEEZE. MAY USE 2 PUFFS 10-20 MINUTES PRIOR TO EXERCISE.   thiamine 100 MG tablet Commonly known as:  VITAMIN B-1 Take 100 mg by mouth daily.   vitamin E 400 UNIT capsule Take 400 Units by mouth at bedtime.       Known medication allergies: Allergies  Allergen Reactions  . Wheat Bran Anaphylaxis    Reaction unknown  . Codeine     REACTION: dizzy and "groggy in my head"  . Eggs Or Egg-Derived Products Nausea And Vomiting    Reaction unknown  . Mold Extract [Trichophyton Mentagrophyte]   . Molds & Smuts   . Morphine     REACTION: tachycardia and anxiety  . Peanut Oil Nausea And Vomiting    Reaction unknown  . Pentazocine Lactate     REACTION: same as morphine  . Protonix [Pantoprazole Sodium] Nausea And Vomiting  . Wheat   . Cetirizine Rash    Around face  . Penicillins Rash    Eyes puffy Has taken low dose pcn and no rx REACTION: rash, SOB     Physical examination: Blood pressure 118/72, pulse 88, temperature 98.4 F (36.9 C), temperature source Oral, resp. rate 19, height 5' 4.5" (1.638 m), weight 148 lb 12.8 oz (67.5 kg), SpO2 97 %.  General: Alert, interactive, in no acute distress. HEENT: TMs pearly gray, turbinates moderately edematous with thick discharge, post-pharynx non erythematous. Neck: Supple without lymphadenopathy. Lungs: Clear to auscultation without wheezing, rhonchi or rales. {no increased work of breathing. CV: Normal S1, S2 without murmurs. Abdomen: Nondistended, nontender. Skin: Warm and dry, without lesions or rashes. Extremities:  No clubbing, cyanosis or edema. Neuro:   Grossly intact.  Diagnositics/Labs: Spirometry: FEV1: 2.37L  101%, FVC: 3.00L  116%, ratio consistent with Nonobstructive pattern  Assessment and plan:   Allergic rhinoconjunctivitis  - She appears to be under dosed with her nasal regimen  -  She will use saline rinse followed by Flonase 2 sprays each nostril daily and Patanase 2 sprays each nostril twice a day  - Continue Claritin 10 mg daily  - Use Pazeo 1 drop each eye as needed daily for itchy, watery, red eyes  - Discussed option of repeat skin testing and revisiting allergen immunotherapy.  If we can find a appropriate allergy medication regimen should would prefer this over starting allergen immunotherapy.  - Discussed allergen avoidance measures for molds and grasses.  Cough, mild persistent asthma - Symptoms with cough and occasional wheeze consistent with an asthma diagnosis. She has had improvement with ICS in the past which also supports this. - We'll restart Qvar 40 mcg 2 puffs twice a day - Continue Singulair 5 mg twice a day - Albuterol 2 puffs every  4-6 hours as needed for cough, wheeze, shortness of breath.  She will monitor frequency of albuterol use  Food sensitivity/intolerance  - She will continue avoidance of wheat and egg products at this time      Follow up Visit: 4-6 months or sooner if needed.   I appreciate the opportunity to take part in Khaliah's care. Please do not hesitate to contact me with questions.  Sincerely,   Prudy Feeler, MD Allergy/Immunology [facility]

## 2016-11-07 NOTE — Patient Instructions (Addendum)
   1. Avoidance: Mold and significant weather/temperature changes.   2. Antihistamine: Claritin 10mg  by mouth once daily for runny nose or itching.   3. Nasal Spray: Patanase 2 spray(s) each nostril each morning and evening for stuffy nose or drainage.       Flonase 2 spray each nostril daily for congestion.  4. Inhalers:  Rescue: ProAir 2 puffs every 4 hours as needed for cough or wheeze.       -May use 2 puffs 10-20 minutes prior to exercise.   Preventative: QVAR 65mcg 2 puffs twice daily  then may            (Rinse, gargle, and spit out after use).   5. Other: Singulair 5mg   twice daily.       Pazeo one drop once daily as needed.  6. Nasal Saline wash prior to medicated nasal sprays listed above and as needed through the day.   7. Follow up Visit: 4-6 months or sooner if needed.   Websites that have reliable Patient information: 1. American Academy of Asthma, Allergy, & Immunology: www.aaaai.org 2. Food Allergy Network: www.foodallergy.org 3. Mothers of Asthmatics: www.aanma.org 4. [place]: DiningCalendar.de 5. American College of Allergy, Asthma, & Immunology: https://robertson.info/ or www.acaai.org   Control of Mold Allergen  Mold and fungi can grow on a variety of surfaces provided certain temperature and moisture conditions exist.  Outdoor molds grow on plants, decaying vegetation and soil.  The major outdoor mold, Alternaria dn Cladosporium, are found in very high numbers during hot and dry conditions.  Generally, a late Summer - Fall peak is seen for common outdoor fungal spores.  Rain will temporarily lower outdoor mold spore count, but counts rise rapidly when the rainy period ends.  The most important indoor molds are Aspergillus and Penicillium.  Dark, humid and poorly ventilated basements are ideal sites for mold growth.  The next most common sites of mold growth are the bathroom and the kitchen.  Outdoor [employer] 1. Use air  conditioning and keep windows closed 2. Avoid exposure to decaying vegetation. 3. Avoid leaf raking. 4. Avoid grain handling. 5. Consider wearing a face mask if working in moldy areas.  Indoor Mold Control 1. Maintain humidity below 50%. 2. Clean washable surfaces with 5% bleach solution. 3. Remove sources e.g. Contaminated carpets.

## 2016-12-11 ENCOUNTER — Ambulatory Visit (INDEPENDENT_AMBULATORY_CARE_PROVIDER_SITE_OTHER): Payer: PPO | Admitting: Internal Medicine

## 2016-12-11 ENCOUNTER — Other Ambulatory Visit (INDEPENDENT_AMBULATORY_CARE_PROVIDER_SITE_OTHER): Payer: PPO

## 2016-12-11 ENCOUNTER — Encounter: Payer: Self-pay | Admitting: Internal Medicine

## 2016-12-11 VITALS — BP 130/76 | HR 65 | Temp 97.9°F | Resp 16 | Ht 65.0 in | Wt 151.0 lb

## 2016-12-11 DIAGNOSIS — R8299 Other abnormal findings in urine: Secondary | ICD-10-CM | POA: Diagnosis not present

## 2016-12-11 DIAGNOSIS — M419 Scoliosis, unspecified: Secondary | ICD-10-CM | POA: Insufficient documentation

## 2016-12-11 DIAGNOSIS — M810 Age-related osteoporosis without current pathological fracture: Secondary | ICD-10-CM

## 2016-12-11 DIAGNOSIS — Z0000 Encounter for general adult medical examination without abnormal findings: Secondary | ICD-10-CM

## 2016-12-11 DIAGNOSIS — R7303 Prediabetes: Secondary | ICD-10-CM | POA: Diagnosis not present

## 2016-12-11 DIAGNOSIS — E7800 Pure hypercholesterolemia, unspecified: Secondary | ICD-10-CM | POA: Diagnosis not present

## 2016-12-11 DIAGNOSIS — L900 Lichen sclerosus et atrophicus: Secondary | ICD-10-CM | POA: Diagnosis not present

## 2016-12-11 DIAGNOSIS — R82998 Other abnormal findings in urine: Secondary | ICD-10-CM | POA: Insufficient documentation

## 2016-12-11 LAB — COMPREHENSIVE METABOLIC PANEL
ALT: 32 U/L (ref 0–35)
AST: 28 U/L (ref 0–37)
Albumin: 4.4 g/dL (ref 3.5–5.2)
Alkaline Phosphatase: 54 U/L (ref 39–117)
BUN: 19 mg/dL (ref 6–23)
CO2: 25 mEq/L (ref 19–32)
Calcium: 9.6 mg/dL (ref 8.4–10.5)
Chloride: 107 mEq/L (ref 96–112)
Creatinine, Ser: 0.77 mg/dL (ref 0.40–1.20)
GFR: 79.56 mL/min (ref >60.00)
Glucose, Bld: 90 mg/dL (ref 70–99)
Potassium: 4 mEq/L (ref 3.5–5.1)
Sodium: 141 mEq/L (ref 135–145)
Total Bilirubin: 0.6 mg/dL (ref 0.2–1.2)
Total Protein: 7.3 g/dL (ref 6.0–8.3)

## 2016-12-11 LAB — URINALYSIS, ROUTINE W REFLEX MICROSCOPIC
Bilirubin Urine: NEGATIVE
Ketones, ur: NEGATIVE
Nitrite: NEGATIVE
Specific Gravity, Urine: 1.015 (ref 1.000–1.030)
Total Protein, Urine: NEGATIVE
Urine Glucose: NEGATIVE
Urobilinogen, UA: 0.2 (ref 0.0–1.0)
pH: 7.5 (ref 5.0–8.0)

## 2016-12-11 LAB — CBC WITH DIFFERENTIAL/PLATELET
Basophils Absolute: 0 10*3/uL (ref 0.0–0.1)
Basophils Relative: 0.3 % (ref 0.0–3.0)
Eosinophils Absolute: 0.2 10*3/uL (ref 0.0–0.7)
Eosinophils Relative: 3.2 % (ref 0.0–5.0)
HCT: 39.8 % (ref 36.0–46.0)
Hemoglobin: 13.1 g/dL (ref 12.0–15.0)
Lymphocytes Relative: 30.5 % (ref 12.0–46.0)
Lymphs Abs: 2.1 10*3/uL (ref 0.7–4.0)
MCHC: 32.9 g/dL (ref 30.0–36.0)
MCV: 86.3 fl (ref 78.0–100.0)
Monocytes Absolute: 0.4 10*3/uL (ref 0.1–1.0)
Monocytes Relative: 5.7 % (ref 3.0–12.0)
Neutro Abs: 4.1 10*3/uL (ref 1.4–7.7)
Neutrophils Relative %: 60.3 % (ref 43.0–77.0)
Platelets: 239 10*3/uL (ref 150.0–400.0)
RBC: 4.6 Mil/uL (ref 3.87–5.11)
RDW: 15.1 % (ref 11.5–15.5)
WBC: 6.7 10*3/uL (ref 4.0–10.5)

## 2016-12-11 LAB — TSH: TSH: 0.62 u[IU]/mL (ref 0.35–4.50)

## 2016-12-11 LAB — LIPID PANEL
Cholesterol: 158 mg/dL (ref 0–200)
HDL: 64 mg/dL (ref >39.00)
LDL Cholesterol: 75 mg/dL (ref 0–99)
NonHDL: 94.24
Total CHOL/HDL Ratio: 2
Triglycerides: 95 mg/dL (ref 0.0–149.0)
VLDL: 19 mg/dL (ref 0.0–40.0)

## 2016-12-11 LAB — HEMOGLOBIN A1C: Hgb A1c MFr Bld: 5.6 % (ref 4.6–6.5)

## 2016-12-11 NOTE — Progress Notes (Addendum)
Subjective:    Patient ID: Kara Ramirez, female    DOB: [birth year], 66 y.o.   MRN: [id number]  HPI Here for an annual physical exam.    She feels fatigued.  She is helping to care for her granddaughter a lot.  She has back pain.  She has a follow up with her neurosurgeon.    She follows with dermatology.    She has vaginal prolapse and a pessary did not help.  She thinks she may need to have surgery.   Medications and allergies reviewed with patient and updated if appropriate.  Patient Active Problem List   Diagnosis Date Noted  . Prediabetes 06/06/2016  . Cough 06/06/2016  . Allergic rhinitis 02/08/2016  . Osteoporosis 12/05/2015  . Chest pain 05/27/2015  . Hyperlipidemia 05/27/2015  . Iron deficiency anemia 08/27/2011  . Rectal bleeding 08/23/2011  . Internal hemorrhoids 08/23/2011  . Constipation 08/23/2011    Current Outpatient Prescriptions on File Prior to Visit  Medication Sig Dispense Refill  . atorvastatin (LIPITOR) 20 MG tablet TAKE 1 TABLET BY MOUTH, ONCE DAILY. 90 tablet 1  . AZASITE 1 % ophthalmic solution     . beclomethasone (QVAR) 40 MCG/ACT inhaler Inhale 2 puffs into the lungs 2 (two) times daily. 1 Inhaler 5  . Calcium Carbonate-Vitamin D (CALCIUM 600+D) 600-400 MG-UNIT per tablet Take 1 tablet by mouth 2 (two) times daily.      . cholecalciferol (VITAMIN D) 1000 UNITS tablet Take 1,000 Units by mouth daily.      . Coenzyme Q10 (CO Q 10) 60 MG CAPS Take 1 tablet by mouth 2 (two) times daily.      [name] diltiazem 2 % GEL Apply to rectal area 2-3 times daily 30 g 0  . fluocinonide ointment (LIDEX) 0.05 % Apply [id number] application topically 2 (two) times daily.    . fluticasone (FLONASE) 50 MCG/ACT nasal spray USE ONE SPRAY IN EACH NOSTRIL MIDDAY FOR CONGESTION. 16 g 5  . ketotifen (ZADITOR) 0.025 % ophthalmic solution 1 drop 2 (two) times daily.    [name] loratadine (CLARITIN) 10 MG tablet Take 10 mg by mouth daily.      [name] MAGNESIUM SULFATE PO Take 1 tablet by  mouth daily.      [name] MANGANESE PO Take 1 tablet by mouth daily.      . montelukast (SINGULAIR) 5 MG chewable tablet CHEW AND SWALLOW ONE TABLET  DAILY TO PREVENT COUGH OR WHEEZE. 60 tablet 5  . naproxen sodium (ALEVE) 220 MG tablet Take 220 mg by mouth daily.     . Olopatadine HCl (PATANASE) 0.6 % SOLN USE TWO SPRAYS IN EACH NOSTRIL EACH MORNING AND EVENING FOR STUFFY NOSE AND DRINAGE. 1 Bottle 5  . Omega-3 Fatty Acids (FISH OIL) 1000 MG CAPS Take 1 capsule by mouth daily.    . polyethylene glycol powder (GLYCOLAX/MIRALAX) powder Take 9 grams (1/2 scoop) dissolved in at least 8 ounces of water/juice three times per week. 527 g 2  . PROAIR HFA 108 (90 Base) MCG/ACT inhaler USE 2 PUFFS EVERY 4 HOURS AS NEEDED FOR COUGH OR WHEEZE. MAY USE 2 PUFFS 10-20 MINUTES PRIOR TO EXERCISE. 8.5 g 0  . Thiamine HCl (VITAMIN B-1) 100 MG tablet Take 100 mg by mouth daily.      . vitamin E 400 UNIT capsule Take 400 Units by mouth at bedtime.       No current facility-administered medications on file prior to visit.     Past  Medical History:  Diagnosis Date  . Anemia   . Arthritis   . Asthma   . Chest pain   . Chronic headaches   . Cystocele   . Diverticulosis   . Endometriosis   . Fibromyalgia   . Hyperlipidemia   . IBS (irritable bowel syndrome)   . Irritable bowel syndrome with constipation   . Nephrolithiasis   . Ovarian cyst   . Seasonal allergies     Past Surgical History:  Procedure Laterality Date  . BLADDER SUSPENSION    . CATARACT EXTRACTION Bilateral   . celiac artery anuerysym  2011  . kindey stone removal    . KNEE SURGERY Right    right x2  . LUMBAR DISC SURGERY  03/13/2011   T12-L7 PINS AND SCREWS  . TOTAL ABDOMINAL HYSTERECTOMY    . VAGINAL PROLAPSE REPAIR      Social History   Social History  . Marital status: Married    Spouse name: N/A  . Number of children: 2  . Years of education: N/A   Occupational History  . retired Scientist, clinical (histocompatibility and immunogenetics)   Social History  Main Topics  . Smoking status: Never Smoker  . Smokeless tobacco: Never Used  . Alcohol use No  . Drug use: No  . Sexual activity: Not Asked   Other Topics Concern  . None   Social History Narrative  . None    Family History  Problem Relation Age of Onset  . Colon cancer Mother   . Anemia Mother     Aplastic anemia-Purpra  . Asthma Mother   . Heart disease Father   . Arthritis Father   . Nephrolithiasis Father   . Heart disease Maternal Grandfather   . Heart disease Paternal Grandfather   . Allergic rhinitis Neg Hx   . Angioedema Neg Hx   . Eczema Neg Hx   . Immunodeficiency Neg Hx   . Urticaria Neg Hx     Review of Systems  Constitutional: Positive for fatigue. Negative for chills and fever.  HENT: Positive for postnasal drip.   Eyes: Positive for visual disturbance (blurry vision at times - needs to pick up new prescription).  Respiratory: Positive for cough (sinus drainage). Negative for shortness of breath and wheezing.   Cardiovascular: Negative for chest pain, palpitations and leg swelling.  Gastrointestinal: Positive for constipation and diarrhea (alternates between diarrhea and constipation). Negative for abdominal pain, blood in stool and nausea.       No gerd  Genitourinary: Negative for dysuria and hematuria.       Urine is darker  Musculoskeletal: Positive for back pain.  Skin: Positive for rash (sees derm).  Neurological: Positive for headaches (sinus related).  Psychiatric/Behavioral: Negative for dysphoric mood. The patient is nervous/anxious (at times, situational).        Objective:   Vitals:   12/11/16 0826  BP: 130/76  Pulse: 65  Resp: 16  Temp: 97.9 F (36.6 C)   Filed Weights   12/11/16 0826  Weight: 151 lb (68.5 kg)   Body mass index is 25.13 kg/m.  Wt Readings from Last 3 Encounters:  12/11/16 151 lb (68.5 kg)  11/07/16 148 lb 12.8 oz (67.5 kg)  08/09/16 153 lb 8 oz (69.6 kg)     Physical Exam Constitutional: She appears  well-developed and well-nourished. No distress.  HENT:  Head: Normocephalic and atraumatic.  Right Ear: External ear normal. Normal ear canal and TM Left Ear: External ear normal.  Normal ear  canal and TM Mouth/Throat: Oropharynx is clear and moist.  Eyes: Conjunctivae and EOM are normal.  Neck: Neck supple. No tracheal deviation present. No thyromegaly present.  No carotid bruit  Cardiovascular: Normal rate, regular rhythm and normal heart sounds.   No murmur heard.  No edema. Pulmonary/Chest: Effort normal and breath sounds normal. No respiratory distress. She has no wheezes. She has no rales.  Breast: deferred to Gyn Abdominal: Soft. She exhibits no distension. There is no tenderness.  Lymphadenopathy: She has no cervical adenopathy.  Skin: Skin is warm and dry. She is not diaphoretic.  Psychiatric: She has a normal mood and affect. Her behavior is normal.         Assessment & Plan:    Physical exam: Screening blood work    ordered Immunizations  Discussed - will get new shingles vaccine, flu today, tetanus up to date, pneumovax in one month Colonoscopy    Up to date  Mammogram    Up to date  79 - Dr Matthew Saras - Up to date  Dexa  - Up to date with Dr Matthew Saras Eye exams    Up to date  EKG  Last done 2016 Exercise - walks dogs - 4 times a days, most days Weight  - BMI is good for age Skin - following with dermatology -  Substance abuse  none  See Problem List for Assessment and Plan of chronic medical problems.    FU in one year for physical exam

## 2016-12-11 NOTE — Assessment & Plan Note (Signed)
Check UA, UCx to rule out infection 

## 2016-12-11 NOTE — Patient Instructions (Addendum)
Test(s) ordered today. Your results will be released to MyChart (or called to you) after review, usually within 72hours after test completion. If any changes need to be made, you will be notified at that same time.  All other Health Maintenance issues reviewed.   All recommended immunizations and age-appropriate screenings are up-to-date or discussed.  Flu immunization administered today.   Medications reviewed and updated.  No changes recommended at this time.    Please followup in one year for a physical    Health Maintenance, Female Introduction Adopting a healthy lifestyle and getting preventive care can go a long way to promote health and wellness. Talk with your health care provider about what schedule of regular examinations is right for you. This is a good chance for you to check in with your provider about disease prevention and staying healthy. In between checkups, there are plenty of things you can do on your own. Experts have done a lot of research about which lifestyle changes and preventive measures are most likely to keep you healthy. Ask your health care provider for more information. Weight and diet Eat a healthy diet  Be sure to include plenty of vegetables, fruits, low-fat dairy products, and lean protein.  Do not eat a lot of foods high in solid fats, added sugars, or salt.  Get regular exercise. This is one of the most important things you can do for your health.  Most adults should exercise for at least 150 minutes each week. The exercise should increase your heart rate and make you sweat (moderate-intensity exercise).  Most adults should also do strengthening exercises at least twice a week. This is in addition to the moderate-intensity exercise. Maintain a healthy weight  Body mass index (BMI) is a measurement that can be used to identify possible weight problems. It estimates body fat based on height and weight. Your health care provider can help determine  your BMI and help you achieve or maintain a healthy weight.  For females 20 years of age and older:  A BMI below 18.5 is considered underweight.  A BMI of 18.5 to 24.9 is normal.  A BMI of 25 to 29.9 is considered overweight.  A BMI of 30 and above is considered obese. Watch levels of cholesterol and blood lipids  You should start having your blood tested for lipids and cholesterol at 67 years of age, then have this test every 5 years.  You may need to have your cholesterol levels checked more often if:  Your lipid or cholesterol levels are high.  You are older than 67 years of age.  You are at high risk for heart disease. Cancer screening Lung Cancer  Lung cancer screening is recommended for adults 55-80 years old who are at high risk for lung cancer because of a history of smoking.  A yearly low-dose CT scan of the lungs is recommended for people who:  Currently smoke.  Have quit within the past 15 years.  Have at least a 30-pack-year history of smoking. A pack year is smoking an average of one pack of cigarettes a day for 1 year.  Yearly screening should continue until it has been 15 years since you quit.  Yearly screening should stop if you develop a health problem that would prevent you from having lung cancer treatment. Breast Cancer  Practice breast self-awareness. This means understanding how your breasts normally appear and feel.  It also means doing regular breast self-exams. Let your health care provider know about   changes, no matter how small.  If you are in your 20s or 30s, you should have a clinical breast exam (CBE) by a health care provider every 1-3 years as part of a regular health exam.  If you are 40 or older, have a CBE every year. Also consider having a breast X-ray (mammogram) every year.  If you have a family history of breast cancer, talk to your health care provider about genetic screening.  If you are at high risk for breast cancer,  talk to your health care provider about having an MRI and a mammogram every year.  Breast cancer gene (BRCA) assessment is recommended for women who have family members with BRCA-related cancers. BRCA-related cancers include:  Breast.  Ovarian.  Tubal.  Peritoneal cancers.  Results of the assessment will determine the need for genetic counseling and BRCA1 and BRCA2 testing. Cervical Cancer  Your health care provider may recommend that you be screened regularly for cancer of the pelvic organs (ovaries, uterus, and vagina). This screening involves a pelvic examination, including checking for microscopic changes to the surface of your cervix (Pap test). You may be encouraged to have this screening done every 3 years, beginning at age 21.  For women ages 30-65, health care providers may recommend pelvic exams and Pap testing every 3 years, or they may recommend the Pap and pelvic exam, combined with testing for human papilloma virus (HPV), every 5 years. Some types of HPV increase your risk of cervical cancer. Testing for HPV may also be done on women of any age with unclear Pap test results.  Other health care providers may not recommend any screening for nonpregnant women who are considered low risk for pelvic cancer and who do not have symptoms. Ask your health care provider if a screening pelvic exam is right for you.  If you have had past treatment for cervical cancer or a condition that could lead to cancer, you need Pap tests and screening for cancer for at least 20 years after your treatment. If Pap tests have been discontinued, your risk factors (such as having a new sexual partner) need to be reassessed to determine if screening should resume. Some women have medical problems that increase the chance of getting cervical cancer. In these cases, your health care provider may recommend more frequent screening and Pap tests. Colorectal Cancer  This type of cancer can be detected and often  prevented.  Routine colorectal cancer screening usually begins at 67 years of age and continues through 67 years of age.  Your health care provider may recommend screening at an earlier age if you have risk factors for colon cancer.  Your health care provider may also recommend using home test kits to check for hidden blood in the stool.  A small camera at the end of a tube can be used to examine your colon directly (sigmoidoscopy or colonoscopy). This is done to check for the earliest forms of colorectal cancer.  Routine screening usually begins at age 50.  Direct examination of the colon should be repeated every 5-10 years through 67 years of age. However, you may need to be screened more often if early forms of precancerous polyps or small growths are found. Skin Cancer  Check your skin from head to toe regularly.  Tell your health care provider about any new moles or changes in moles, especially if there is a change in a mole's shape or color.  Also tell your health care provider if you have   a mole that is larger than the size of a pencil eraser.  Always use sunscreen. Apply sunscreen liberally and repeatedly throughout the day.  Protect yourself by wearing long sleeves, pants, a wide-brimmed hat, and sunglasses whenever you are outside. Heart disease, diabetes, and high blood pressure  High blood pressure causes heart disease and increases the risk of stroke. High blood pressure is more likely to develop in:  People who have blood pressure in the high end of the normal range (130-139/85-89 mm Hg).  People who are overweight or obese.  People who are African American.  If you are 18-39 years of age, have your blood pressure checked every 3-5 years. If you are 40 years of age or older, have your blood pressure checked every year. You should have your blood pressure measured twice-once when you are at a hospital or clinic, and once when you are not at a hospital or clinic. Record  the average of the two measurements. To check your blood pressure when you are not at a hospital or clinic, you can use:  An automated blood pressure machine at a pharmacy.  A home blood pressure monitor.  If you are between 55 years and 79 years old, ask your health care provider if you should take aspirin to prevent strokes.  Have regular diabetes screenings. This involves taking a blood sample to check your fasting blood sugar level.  If you are at a normal weight and have a low risk for diabetes, have this test once every three years after 67 years of age.  If you are overweight and have a high risk for diabetes, consider being tested at a younger age or more often. Preventing infection Hepatitis B  If you have a higher risk for hepatitis B, you should be screened for this virus. You are considered at high risk for hepatitis B if:  You were born in a country where hepatitis B is common. Ask your health care provider which countries are considered high risk.  Your parents were born in a high-risk country, and you have not been immunized against hepatitis B (hepatitis B vaccine).  You have HIV or AIDS.  You use needles to inject street drugs.  You live with someone who has hepatitis B.  You have had sex with someone who has hepatitis B.  You get hemodialysis treatment.  You take certain medicines for conditions, including cancer, organ transplantation, and autoimmune conditions. Hepatitis C  Blood testing is recommended for:  Everyone born from 1945 through 1965.  Anyone with known risk factors for hepatitis C. Sexually transmitted infections (STIs)  You should be screened for sexually transmitted infections (STIs) including gonorrhea and chlamydia if:  You are sexually active and are younger than 67 years of age.  You are older than 67 years of age and your health care provider tells you that you are at risk for this type of infection.  Your sexual activity has  changed since you were last screened and you are at an increased risk for chlamydia or gonorrhea. Ask your health care provider if you are at risk.  If you do not have HIV, but are at risk, it may be recommended that you take a prescription medicine daily to prevent HIV infection. This is called pre-exposure prophylaxis (PrEP). You are considered at risk if:  You are sexually active and do not regularly use condoms or know the HIV status of your partner(s).  You take drugs by injection.  You are sexually   active with a partner who has HIV. Talk with your health care provider about whether you are at high risk of being infected with HIV. If you choose to begin PrEP, you should first be tested for HIV. You should then be tested every 3 months for as long as you are taking PrEP. Pregnancy  If you are premenopausal and you may become pregnant, ask your health care provider about preconception counseling.  If you may become pregnant, take 400 to 800 micrograms (mcg) of folic acid every day.  If you want to prevent pregnancy, talk to your health care provider about birth control (contraception). Osteoporosis and menopause  Osteoporosis is a disease in which the bones lose minerals and strength with aging. This can result in serious bone fractures. Your risk for osteoporosis can be identified using a bone density scan.  If you are 74 years of age or older, or if you are at risk for osteoporosis and fractures, ask your health care provider if you should be screened.  Ask your health care provider whether you should take a calcium or vitamin D supplement to lower your risk for osteoporosis.  Menopause may have certain physical symptoms and risks.  Hormone replacement therapy may reduce some of these symptoms and risks. Talk to your health care provider about whether hormone replacement therapy is right for you. Follow these instructions at home:  Schedule regular health, dental, and eye  exams.  Stay current with your immunizations.  Do not use any tobacco products including cigarettes, chewing tobacco, or electronic cigarettes.  If you are pregnant, do not drink alcohol.  If you are breastfeeding, limit how much and how often you drink alcohol.  Limit alcohol intake to no more than 1 drink per day for nonpregnant women. One drink equals 12 ounces of beer, 5 ounces of wine, or 1 ounces of hard liquor.  Do not use street drugs.  Do not share needles.  Ask your health care provider for help if you need support or information about quitting drugs.  Tell your health care provider if you often feel depressed.  Tell your health care provider if you have ever been abused or do not feel safe at home. This information is not intended to replace advice given to you by your health care provider. Make sure you discuss any questions you have with your health care provider. Document Released: 05/21/2011 Document Revised: 04/12/2016 Document Reviewed: 08/09/2015  2017 [employer]

## 2016-12-11 NOTE — Assessment & Plan Note (Signed)
Check lipid panel  Continue daily statin Regular exercise and healthy diet encouraged  

## 2016-12-11 NOTE — Assessment & Plan Note (Signed)
Check a1c Low sugar / carb diet Stressed regular exercise, keeping weight down  

## 2016-12-11 NOTE — Assessment & Plan Note (Signed)
dexa up to date - done by Gyn Taking calcium and vitamin D Walking her daughter's dogs 4 times a day

## 2016-12-12 LAB — URINE CULTURE: Organism ID, Bacteria: NO GROWTH

## 2016-12-14 ENCOUNTER — Telehealth: Payer: Self-pay | Admitting: Emergency Medicine

## 2016-12-14 NOTE — Telephone Encounter (Signed)
Pt called and wants to know if somebody can call her about her labs. Please advise thanks.

## 2016-12-15 ENCOUNTER — Encounter: Payer: Self-pay | Admitting: Internal Medicine

## 2016-12-17 NOTE — Telephone Encounter (Signed)
Spoke with pt, results were seen on MyChart after she called the office.

## 2016-12-25 DIAGNOSIS — B0089 Other herpesviral infection: Secondary | ICD-10-CM | POA: Diagnosis not present

## 2016-12-25 DIAGNOSIS — L900 Lichen sclerosus et atrophicus: Secondary | ICD-10-CM | POA: Diagnosis not present

## 2016-12-26 ENCOUNTER — Ambulatory Visit (INDEPENDENT_AMBULATORY_CARE_PROVIDER_SITE_OTHER): Payer: PPO

## 2016-12-26 DIAGNOSIS — Z23 Encounter for immunization: Secondary | ICD-10-CM

## 2016-12-26 DIAGNOSIS — Z299 Encounter for prophylactic measures, unspecified: Secondary | ICD-10-CM

## 2016-12-26 DIAGNOSIS — M4120 Other idiopathic scoliosis, site unspecified: Secondary | ICD-10-CM | POA: Diagnosis not present

## 2017-03-14 ENCOUNTER — Telehealth: Payer: Self-pay

## 2017-03-14 ENCOUNTER — Ambulatory Visit (INDEPENDENT_AMBULATORY_CARE_PROVIDER_SITE_OTHER): Payer: PPO | Admitting: Internal Medicine

## 2017-03-14 ENCOUNTER — Encounter: Payer: Self-pay | Admitting: Internal Medicine

## 2017-03-14 ENCOUNTER — Ambulatory Visit: Payer: PPO | Admitting: Internal Medicine

## 2017-03-14 VITALS — BP 130/76 | HR 82 | Ht 66.0 in | Wt 151.0 lb

## 2017-03-14 DIAGNOSIS — L900 Lichen sclerosus et atrophicus: Secondary | ICD-10-CM | POA: Diagnosis not present

## 2017-03-14 DIAGNOSIS — R7303 Prediabetes: Secondary | ICD-10-CM

## 2017-03-14 DIAGNOSIS — J329 Chronic sinusitis, unspecified: Secondary | ICD-10-CM | POA: Insufficient documentation

## 2017-03-14 DIAGNOSIS — J3489 Other specified disorders of nose and nasal sinuses: Secondary | ICD-10-CM | POA: Insufficient documentation

## 2017-03-14 DIAGNOSIS — J309 Allergic rhinitis, unspecified: Secondary | ICD-10-CM | POA: Diagnosis not present

## 2017-03-14 MED ORDER — PREDNISONE 10 MG PO TABS: ORAL_TABLET | ORAL | 0 refills | Status: DC

## 2017-03-14 MED ORDER — LEVOFLOXACIN 500 MG PO TABS: 500.0000 mg | ORAL_TABLET | Freq: Every day | ORAL | 0 refills | Status: AC

## 2017-03-14 MED ORDER — FLUTICASONE PROPIONATE HFA 44 MCG/ACT IN AERO: 2.0000 | INHALATION_SPRAY | Freq: Two times a day (BID) | RESPIRATORY_TRACT | 0 refills | Status: DC

## 2017-03-14 MED ORDER — METHYLPREDNISOLONE ACETATE 80 MG/ML IJ SUSP
80.0000 mg | Freq: Once | INTRAMUSCULAR | Status: AC
  Administered 2017-03-14: 80 mg via INTRAMUSCULAR

## 2017-03-14 NOTE — Assessment & Plan Note (Signed)
With acute flare - for levaquin asd, refer ENT

## 2017-03-14 NOTE — Assessment & Plan Note (Signed)
With seasonal flare - for depomedrol 80 IM, prednisone asd,  to f/u any worsening symptoms or concerns

## 2017-03-14 NOTE — Assessment & Plan Note (Signed)
stable overall by history and exam, recent data reviewed with pt, and pt to continue medical treatment as before,  to f/u any worsening symptoms or concerns Lab Results  Component Value Date   HGBA1C 5.6 12/11/2016  to f/u any onset polys or cbg > 200 with steroid tx

## 2017-03-14 NOTE — Patient Instructions (Signed)
You had the steroid shot today  Please take all new medication as prescribed - the antibiotic, and prednisone  You will be contacted regarding the referral for: ENT  Please continue all other medications as before, and refills have been done if requested.  Please have the pharmacy call with any other refills you may need  Please keep your appointments with your specialists as you may have planned

## 2017-03-14 NOTE — Progress Notes (Signed)
Subjective:    Patient ID: Kara Ramirez, female    DOB: 1951/02/24, 66 y.o.   MRN: [id number]  HPI   Here with 2-3 days acute onset fever, facial pain, pressure, headache, general weakness and malaise, and greenish d/c, with mild ST and cough, but pt denies chest pain, wheezing, increased sob or doe, orthopnea, PND, increased LE swelling, palpitations, dizziness or syncope.  Does have several wks ongoing nasal allergy symptoms with clearish congestion, itch and sneezing, without fever, pain, ST, cough, swelling or wheezing. Has sense of bilat nasal obstruction, hard to pass air, gets sob.  Did have exposure recent to 3yo grandchild with possible URI.  Pt denies polydipsia, polyuria, wt overall stable   Past Medical History:  Diagnosis Date  . Anemia   . Arthritis   . Asthma   . Chest pain   . Chronic headaches   . Cystocele   . Diverticulosis   . Endometriosis   . Fibromyalgia   . Hyperlipidemia   . IBS (irritable bowel syndrome)   . Irritable bowel syndrome with constipation   . Nephrolithiasis   . Ovarian cyst   . Seasonal allergies    Past Surgical History:  Procedure Laterality Date  . BLADDER SUSPENSION    . CATARACT EXTRACTION Bilateral   . celiac artery anuerysym  2011  . kindey stone removal    . KNEE SURGERY Right    right x2  . LUMBAR DISC SURGERY  03/13/2011   T12-L7 PINS AND SCREWS  . TOTAL ABDOMINAL HYSTERECTOMY    . VAGINAL PROLAPSE REPAIR      reports that she has never smoked. She has never used smokeless tobacco. She reports that she does not drink alcohol or use drugs. family history includes Anemia in her mother; Arthritis in her father; Asthma in her mother; Colon cancer in her mother; Heart disease in her father, maternal grandfather, and paternal grandfather; Nephrolithiasis in her father. Allergies  Allergen Reactions  . Wheat Bran Anaphylaxis    Reaction unknown  . Codeine     REACTION: dizzy and "groggy in my head"  . Eggs Or Egg-Derived  Products Nausea And Vomiting    Reaction unknown  . Mold Extract [Trichophyton Mentagrophyte]   . Molds & Smuts   . Morphine     REACTION: tachycardia and anxiety  . Peanut Oil Nausea And Vomiting    Reaction unknown  . Pentazocine Lactate     REACTION: same as morphine  . Protonix [Pantoprazole Sodium] Nausea And Vomiting  . Wheat   . Cetirizine Rash    Around face  . Penicillins Rash    Eyes puffy Has taken low dose pcn and no rx REACTION: rash, SOB   Current Outpatient Prescriptions on File Prior to Visit  Medication Sig Dispense Refill  . atorvastatin (LIPITOR) 20 MG tablet TAKE 1 TABLET BY MOUTH, ONCE DAILY. 90 tablet 1  . AZASITE 1 % ophthalmic solution     . Calcium Carbonate-Vitamin D (CALCIUM 600+D) 600-400 MG-UNIT per tablet Take 1 tablet by mouth 2 (two) times daily.      . cholecalciferol (VITAMIN D) 1000 UNITS tablet Take 1,000 Units by mouth daily.      . Coenzyme Q10 (CO Q 10) 60 MG CAPS Take 1 tablet by mouth 2 (two) times daily.      [name] diltiazem 2 % GEL Apply to rectal area 2-3 times daily 30 g 0  . fluocinonide ointment (LIDEX) 0.05 % Apply 9.38 application topically 2 (  two) times daily.    . fluticasone (FLONASE) 50 MCG/ACT nasal spray USE ONE SPRAY IN EACH NOSTRIL MIDDAY FOR CONGESTION. 16 g 5  . ketotifen (ZADITOR) 0.025 % ophthalmic solution 1 drop 2 (two) times daily.    [name] loratadine (CLARITIN) 10 MG tablet Take 10 mg by mouth daily.      [name] MAGNESIUM SULFATE PO Take 1 tablet by mouth daily.      [name] MANGANESE PO Take 1 tablet by mouth daily.      . montelukast (SINGULAIR) 5 MG chewable tablet CHEW AND SWALLOW ONE TABLET  DAILY TO PREVENT COUGH OR WHEEZE. 60 tablet 5  . naproxen sodium (ALEVE) 220 MG tablet Take 220 mg by mouth daily.     . Olopatadine HCl (PATANASE) 0.6 % SOLN USE TWO SPRAYS IN EACH NOSTRIL EACH MORNING AND EVENING FOR STUFFY NOSE AND DRINAGE. 1 Bottle 5  . Omega-3 Fatty Acids (FISH OIL) 1000 MG CAPS Take 1 capsule by mouth daily.    .  polyethylene glycol powder (GLYCOLAX/MIRALAX) powder Take 9 grams (1/2 scoop) dissolved in at least 8 ounces of water/juice three times per week. 527 g 2  . PROAIR HFA 108 (90 Base) MCG/ACT inhaler USE 2 PUFFS EVERY 4 HOURS AS NEEDED FOR COUGH OR WHEEZE. MAY USE 2 PUFFS 10-20 MINUTES PRIOR TO EXERCISE. 8.5 g 0  . Thiamine HCl (VITAMIN B-1) 100 MG tablet Take 100 mg by mouth daily.      . vitamin E 400 UNIT capsule Take 400 Units by mouth at bedtime.       No current facility-administered medications on file prior to visit.    Review of Systems  Constitutional: Negative for other unusual diaphoresis or sweats HENT: Negative for ear discharge or swelling Eyes: Negative for other worsening visual disturbances Respiratory: Negative for stridor or other swelling  Gastrointestinal: Negative for worsening distension or other blood Genitourinary: Negative for retention or other urinary change Musculoskeletal: Negative for other MSK pain or swelling Skin: Negative for color change or other new lesions Neurological: Negative for worsening tremors and other numbness  Psychiatric/Behavioral: Negative for worsening agitation or other fatigue All other system neg per pt    Objective:   Physical Exam BP 130/76   Pulse 82   Ht 5\' 6"  (1.676 m)   Wt 151 lb (68.5 kg)   SpO2 98%   BMI 24.37 kg/m  VS noted, mild ill Constitutional: Pt appears in NAD HENT: Head: NCAT.  Right Ear: External ear normal.  Left Ear: External ear normal.  Eyes: . Pupils are equal, round, and reactive to light. Conjunctivae and EOM are normal Bilat tm's with mild erythema.  Max sinus areas mild tender.  Pharynx with mild erythema, no exudate Nose: without d/c or deformity Neck: Neck supple. Gross normal ROM Cardiovascular: Normal rate and regular rhythm.   Pulmonary/Chest: Effort normal and breath sounds without rales or wheezing.  Abd:  Soft, NT, ND, + BS, no organomegaly Neurological: Pt is alert. At baseline  orientation, motor grossly intact Skin: Skin is warm. No rashes, other new lesions, no LE edema Psychiatric: Pt behavior is normal without agitation  No other exam findings    Assessment & Plan:

## 2017-03-14 NOTE — Progress Notes (Signed)
Pre visit review using our clinic review tool, if applicable. No additional management support is needed unless otherwise documented below in the visit note. 

## 2017-03-29 NOTE — Telephone Encounter (Signed)
Received a fax requesting a refill.

## 2017-04-12 ENCOUNTER — Telehealth: Payer: Self-pay | Admitting: Internal Medicine

## 2017-04-12 DIAGNOSIS — J329 Chronic sinusitis, unspecified: Secondary | ICD-10-CM

## 2017-04-12 NOTE — Telephone Encounter (Signed)
New referral ordered

## 2017-04-12 NOTE — Telephone Encounter (Signed)
Patient is requesting a referral for an ENT. She saw Dr. Jenny Reichmann on 4/26. She states he was going to send one in once she got the information for him. She would like it sent to.  [facility] in [place], [place]  Fax # [phone]

## 2017-05-16 DIAGNOSIS — R2689 Other abnormalities of gait and mobility: Secondary | ICD-10-CM | POA: Diagnosis not present

## 2017-05-16 DIAGNOSIS — H903 Sensorineural hearing loss, bilateral: Secondary | ICD-10-CM | POA: Diagnosis not present

## 2017-05-16 DIAGNOSIS — J320 Chronic maxillary sinusitis: Secondary | ICD-10-CM | POA: Diagnosis not present

## 2017-05-16 DIAGNOSIS — Z77120 Contact with and (suspected) exposure to mold (toxic): Secondary | ICD-10-CM | POA: Diagnosis not present

## 2017-05-16 DIAGNOSIS — Z87828 Personal history of other (healed) physical injury and trauma: Secondary | ICD-10-CM | POA: Diagnosis not present

## 2017-05-30 ENCOUNTER — Other Ambulatory Visit: Payer: Self-pay | Admitting: Otolaryngology

## 2017-05-30 DIAGNOSIS — J320 Chronic maxillary sinusitis: Secondary | ICD-10-CM

## 2017-06-04 ENCOUNTER — Ambulatory Visit
Admission: RE | Admit: 2017-06-04 | Discharge: 2017-06-04 | Disposition: A | Discharge status: Home / Self Care | Payer: PPO | Source: Ambulatory Visit | Attending: Otolaryngology | Admitting: Otolaryngology

## 2017-06-04 DIAGNOSIS — J320 Chronic maxillary sinusitis: Secondary | ICD-10-CM

## 2017-06-18 DIAGNOSIS — Z87828 Personal history of other (healed) physical injury and trauma: Secondary | ICD-10-CM | POA: Diagnosis not present

## 2017-06-18 DIAGNOSIS — R2689 Other abnormalities of gait and mobility: Secondary | ICD-10-CM | POA: Diagnosis not present

## 2017-06-18 DIAGNOSIS — Z77120 Contact with and (suspected) exposure to mold (toxic): Secondary | ICD-10-CM | POA: Diagnosis not present

## 2017-06-18 DIAGNOSIS — H903 Sensorineural hearing loss, bilateral: Secondary | ICD-10-CM | POA: Diagnosis not present

## 2017-06-25 ENCOUNTER — Other Ambulatory Visit: Payer: Self-pay | Admitting: Internal Medicine

## 2017-06-26 DIAGNOSIS — M4120 Other idiopathic scoliosis, site unspecified: Secondary | ICD-10-CM | POA: Diagnosis not present

## 2017-07-08 DIAGNOSIS — H538 Other visual disturbances: Secondary | ICD-10-CM | POA: Diagnosis not present

## 2017-07-08 DIAGNOSIS — H04123 Dry eye syndrome of bilateral lacrimal glands: Secondary | ICD-10-CM | POA: Diagnosis not present

## 2017-07-08 DIAGNOSIS — H01009 Unspecified blepharitis unspecified eye, unspecified eyelid: Secondary | ICD-10-CM | POA: Diagnosis not present

## 2017-08-07 DIAGNOSIS — G8929 Other chronic pain: Secondary | ICD-10-CM | POA: Diagnosis not present

## 2017-08-07 DIAGNOSIS — K900 Celiac disease: Secondary | ICD-10-CM | POA: Diagnosis not present

## 2017-08-07 DIAGNOSIS — E7800 Pure hypercholesterolemia, unspecified: Secondary | ICD-10-CM | POA: Diagnosis not present

## 2017-08-07 DIAGNOSIS — I493 Ventricular premature depolarization: Secondary | ICD-10-CM | POA: Diagnosis not present

## 2017-08-07 DIAGNOSIS — R0789 Other chest pain: Secondary | ICD-10-CM | POA: Diagnosis not present

## 2017-08-07 DIAGNOSIS — R008 Other abnormalities of heart beat: Secondary | ICD-10-CM | POA: Diagnosis not present

## 2017-08-07 DIAGNOSIS — R079 Chest pain, unspecified: Secondary | ICD-10-CM | POA: Diagnosis not present

## 2017-08-07 DIAGNOSIS — J4540 Moderate persistent asthma, uncomplicated: Secondary | ICD-10-CM | POA: Diagnosis not present

## 2017-08-07 DIAGNOSIS — K589 Irritable bowel syndrome without diarrhea: Secondary | ICD-10-CM | POA: Diagnosis not present

## 2017-08-07 DIAGNOSIS — M549 Dorsalgia, unspecified: Secondary | ICD-10-CM | POA: Diagnosis not present

## 2017-08-07 DIAGNOSIS — R0602 Shortness of breath: Secondary | ICD-10-CM | POA: Diagnosis not present

## 2017-08-08 DIAGNOSIS — I089 Rheumatic multiple valve disease, unspecified: Secondary | ICD-10-CM | POA: Diagnosis not present

## 2017-08-08 DIAGNOSIS — J45909 Unspecified asthma, uncomplicated: Secondary | ICD-10-CM | POA: Diagnosis not present

## 2017-08-08 DIAGNOSIS — R0602 Shortness of breath: Secondary | ICD-10-CM | POA: Diagnosis not present

## 2017-08-08 DIAGNOSIS — I951 Orthostatic hypotension: Secondary | ICD-10-CM | POA: Diagnosis not present

## 2017-08-08 DIAGNOSIS — R0789 Other chest pain: Secondary | ICD-10-CM | POA: Diagnosis not present

## 2017-08-11 DIAGNOSIS — Z9289 Personal history of other medical treatment: Secondary | ICD-10-CM | POA: Diagnosis not present

## 2017-08-11 DIAGNOSIS — J302 Other seasonal allergic rhinitis: Secondary | ICD-10-CM | POA: Diagnosis not present

## 2017-08-11 DIAGNOSIS — J45909 Unspecified asthma, uncomplicated: Secondary | ICD-10-CM | POA: Diagnosis not present

## 2017-08-12 ENCOUNTER — Telehealth: Payer: Self-pay | Admitting: Cardiovascular Disease

## 2017-08-12 NOTE — Telephone Encounter (Signed)
Spoke with pt she states that she is out of town staying with her son and had to go to the hospital, she states that she had PVC"S and abnormal ekg, and would like to know how son she should be seen she states that she will be out of town for at least 2 more weeks. Gave pt fax and phone numbers to have notes faxed to add to her chart and informed pt that she has not been seen since 2016 and should be seen soon, she states that the hospital did not say when she should be seen. She will talk to hospes and call back if anything else is needed

## 2017-08-12 NOTE — Telephone Encounter (Signed)
New message    Pt is calling to ask about follow up appt. She said she was seen in [place], she is staying with her son up there. She wants to know if she needs to follow up while she is there or if it can wait until she gets home.

## 2017-08-14 ENCOUNTER — Other Ambulatory Visit: Payer: Self-pay

## 2017-08-14 NOTE — Patient Outreach (Signed)
[name]) Care Management  08/14/2017  Kara Ramirez [birth year] [id number]   Transition of care will be completed by primary care provider office who will refer to [facility] care management if needed.   PLAN:  RNCM will refer patient to care management assistant to close due to patient being enrolled in an external program.   [name] RN,BSN,CCM [facility] Telephonic  [phone]

## 2017-08-16 ENCOUNTER — Telehealth: Payer: Self-pay | Admitting: Cardiovascular Disease

## 2017-08-16 NOTE — Telephone Encounter (Signed)
Pt wants to know if you have received all her test results and records form where she was in the hospital in [place]?

## 2017-08-19 ENCOUNTER — Telehealth: Payer: Self-pay | Admitting: Physician Assistant

## 2017-08-19 NOTE — Telephone Encounter (Signed)
S/w pt she states that she is intermittently SOB and a little chest tightness (denies chest pain, sweating, nausea or vomiting)and she went to the hospital in [facility] and they did a EKG and stress test that was "a little abnormal but ok"  She will ask them to fax again fax number given to pt for her to have them faxed. She states that she was told that this is her allergies and should be evaluated by her cardiologist. Pt states that she will not be here in [place] until the 09-02-17 appt scheduled for 8 am. She states that if any further sx develop she will go to the ER again.

## 2017-08-19 NOTE — Telephone Encounter (Signed)
Received records from [facility] for appointment on 09/02/17 with Almyra Deforest, PA.  Records put with Hao's schedule for 09/02/17. lp

## 2017-08-19 NOTE — Telephone Encounter (Signed)
Follow up      Pt c/o chest tightness, sob and "odd feeling" and went to [place] on 9-19. They kept her overnight.  She has been living with her daughter in [place].  She is not back in [place] and want to talk to the nurse regarding her hosp visit.

## 2017-08-19 NOTE — Telephone Encounter (Signed)
°  Follow Up   States records from [facility] in [place] were re-faxed over today. Calling to make office aware.

## 2017-08-20 NOTE — Telephone Encounter (Signed)
Records were received.

## 2017-08-23 DIAGNOSIS — J45909 Unspecified asthma, uncomplicated: Secondary | ICD-10-CM | POA: Diagnosis not present

## 2017-08-23 DIAGNOSIS — R0789 Other chest pain: Secondary | ICD-10-CM | POA: Diagnosis not present

## 2017-08-29 NOTE — Progress Notes (Signed)
Subjective:    Patient ID: Kara Ramirez, female    DOB: 09-04-1951, 66 y.o.   MRN: [id number]  HPI The patient is here for follow up from the hospital.  Admitted to [facility] for chest pain and SOB.  She was having PVCs on telemetry.  She had a stress test, which was normal.  Cardiology saw her.  She has an appointment next week with Dr Alvester Chou.   The cardiologist was concerned she may have pulmonary disease.   Her husband has sleep apnea and is not sure if she has any sleep apnea symptoms.    Nasal congestion, PND:  She takes singulair and Youth worker.  She uses flonase.  She uses a neti pot.  She has seen ENT in the past.  She has had a Ct of the sinuses in the past.    She is still having some chest discomfort in her upper chest.  She has pain with walking, deep breaths.    She does feel SOB with exertion.  She uses two puffs twice in the morning.   Medications and allergies reviewed with patient and updated if appropriate.  Patient Active Problem List   Diagnosis Date Noted  . Chronic sinusitis 03/14/2017  . Nasal obstruction 03/14/2017  . Severe scoliosis 12/11/2016  . Dark urine 12/11/2016  . Prediabetes 06/06/2016  . Cough 06/06/2016  . Allergic rhinitis 02/08/2016  . Osteoporosis 12/05/2015  . Chest pain 05/27/2015  . Hyperlipidemia 05/27/2015  . 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 Iron deficiency anemia 08/27/2011  . Rectal bleeding 08/23/2011  . Internal hemorrhoids 08/23/2011  . Constipation 08/23/2011    Current Outpatient Prescriptions on File Prior to Visit  Medication Sig Dispense Refill  . albuterol (PROVENTIL HFA;VENTOLIN HFA) 108 (90 Base) MCG/ACT inhaler Inhale 2 puffs into the lungs every 6 (six) hours as needed.    [name] atorvastatin (LIPITOR) 20 MG tablet TAKE 1 TABLET BY MOUTH, ONCE DAILY. 90 tablet 0  . Calcium Carbonate-Vitamin D (CALCIUM 600+D) 600-400 MG-UNIT per tablet Take 1 tablet by mouth 2 (two) times daily.      .  cholecalciferol (VITAMIN D) 1000 UNITS tablet Take 1,000 Units by mouth daily.      . Coenzyme Q10 (CO Q 10) 60 MG CAPS Take 1 tablet by mouth 2 (two) times daily.      [name] diltiazem 2 % GEL Apply to rectal area 2-3 times daily 30 g 0  . fluocinonide ointment (LIDEX) 0.05 % Apply 8.41 application topically 2 (two) times daily.    . fluorometholone (FML) 0.1 % ophthalmic ointment Place 1 application into both eyes 3 (three) times daily.    . fluticasone (FLONASE) 50 MCG/ACT nasal spray USE ONE SPRAY IN EACH NOSTRIL MIDDAY FOR CONGESTION. 16 g 5  . fluticasone (FLOVENT HFA) 44 MCG/ACT inhaler Inhale 2 puffs into the lungs 2 (two) times daily. 3 Inhaler 0  . ketotifen (ZADITOR) 0.025 % ophthalmic solution 1 drop 2 (two) times daily.    [name] MAGNESIUM SULFATE PO Take 1 tablet by mouth daily.      [name] MANGANESE PO Take 1 tablet by mouth daily.      . montelukast (SINGULAIR) 5 MG chewable tablet CHEW AND SWALLOW ONE TABLET  DAILY TO PREVENT COUGH OR WHEEZE. 60 tablet 5  . naproxen sodium (ALEVE) 220 MG tablet Take 220 mg by mouth daily.     . polyethylene glycol powder (GLYCOLAX/MIRALAX) powder Take 9 grams (1/2 scoop) dissolved in at  least 8 ounces of water/juice three times per week. 527 g 2  . PROAIR HFA 108 (90 Base) MCG/ACT inhaler USE 2 PUFFS EVERY 4 HOURS AS NEEDED FOR COUGH OR WHEEZE. MAY USE 2 PUFFS 10-20 MINUTES PRIOR TO EXERCISE. 8.5 g 0  . tacrolimus (PROTOPIC) 0.1 % ointment Apply 1 application topically 2 (two) times daily.    . Thiamine HCl (VITAMIN B-1) 100 MG tablet Take 100 mg by mouth daily.      [name] Ubiquinol 100 MG CAPS Take 1 capsule by mouth daily.    . vitamin E 400 UNIT capsule Take 400 Units by mouth at bedtime.      [name] loratadine (CLARITIN) 10 MG tablet Take 10 mg by mouth daily.      . Omega-3 Fatty Acids (FISH OIL) 1000 MG CAPS Take 1 capsule by mouth daily.     No current facility-administered medications on file prior to visit.     Past Medical History:  Diagnosis Date  .  Anemia   . Arthritis   . Asthma   . Chest pain   . Chronic headaches   . Cystocele   . Diverticulosis   . Endometriosis   . Fibromyalgia   . Hyperlipidemia   . IBS (irritable bowel syndrome)   . Irritable bowel syndrome with constipation   . Nephrolithiasis   . Ovarian cyst   . PAF (paroxysmal atrial fibrillation) ([place])   . Seasonal allergies     Past Surgical History:  Procedure Laterality Date  . BLADDER SUSPENSION    . CATARACT EXTRACTION Bilateral   . celiac artery anuerysym  2011  . kindey stone removal    . KNEE SURGERY Right    right x2  . LUMBAR DISC SURGERY  03/13/2011   T12-L7 PINS AND SCREWS  . TOTAL ABDOMINAL HYSTERECTOMY    . VAGINAL PROLAPSE REPAIR      Social History   Social History  . Marital status: Married    Spouse name: N/A  . Number of children: 2  . Years of education: N/A   Occupational History  . retired Scientist, clinical (histocompatibility and immunogenetics)   Social History Main Topics  . Smoking status: Never Smoker  . Smokeless tobacco: Never Used  . Alcohol use No  . Drug use: No  . Sexual activity: Not Asked   Other Topics Concern  . None   Social History Narrative  . None    Family History  Problem Relation Age of Onset  . Colon cancer Mother   . Anemia Mother        Aplastic anemia-[place]pra  . Asthma Mother   . Heart disease Father   . Arthritis Father   . Nephrolithiasis Father   . Heart disease Maternal Grandfather   . Heart disease Paternal Grandfather   . Allergic rhinitis Neg Hx   . Angioedema Neg Hx   . Eczema Neg Hx   . Immunodeficiency Neg Hx   . Urticaria Neg Hx     Review of Systems  Constitutional: Positive for fatigue. Negative for fever.  HENT: Positive for congestion, postnasal drip and sinus pressure.   Respiratory: Positive for cough (related to PND) and shortness of breath. Negative for wheezing.   Cardiovascular: Positive for chest pain. Negative for palpitations and leg swelling.  Neurological: Positive for headaches  (sinus). Negative for light-headedness.       Objective:   Vitals:   08/30/17 0959  BP: 108/80  Pulse: 78  Temp: 98.1 F (36.7 C)  SpO2: 98%   Wt Readings from Last 3 Encounters:  08/30/17 149 lb (67.6 kg)  03/14/17 151 lb (68.5 kg)  12/11/16 151 lb (68.5 kg)   Body mass index is 24.05 kg/m.   Physical Exam    Constitutional: Appears well-developed and well-nourished. No distress.  HENT:  Head: Normocephalic and atraumatic.  Neck: Neck supple. No tracheal deviation present. No thyromegaly present.  No cervical lymphadenopathy Cardiovascular: Normal rate, regular rhythm and normal heart sounds.   No murmur heard. No carotid bruit .  No edema Pulmonary/Chest: anterior upper chest tenderness with palpation,  Effort normal and breath sounds normal. No respiratory distress. No has no wheezes. No rales.  Skin: Skin is warm and dry. Not diaphoretic.  Psychiatric: Normal mood and affect. Behavior is normal.      Assessment & Plan:    See Problem List for Assessment and Plan of chronic medical problems.

## 2017-08-30 ENCOUNTER — Ambulatory Visit (INDEPENDENT_AMBULATORY_CARE_PROVIDER_SITE_OTHER): Payer: PPO | Admitting: Internal Medicine

## 2017-08-30 ENCOUNTER — Encounter: Payer: Self-pay | Admitting: Internal Medicine

## 2017-08-30 VITALS — BP 108/80 | HR 78 | Temp 98.1°F | Ht 66.0 in | Wt 149.0 lb

## 2017-08-30 DIAGNOSIS — R0602 Shortness of breath: Secondary | ICD-10-CM | POA: Diagnosis not present

## 2017-08-30 DIAGNOSIS — Z23 Encounter for immunization: Secondary | ICD-10-CM

## 2017-08-30 DIAGNOSIS — R071 Chest pain on breathing: Secondary | ICD-10-CM | POA: Diagnosis not present

## 2017-08-30 NOTE — Patient Instructions (Addendum)
  All other Health Maintenance issues reviewed.   All recommended immunizations and age-appropriate screenings are up-to-date or discussed.  Flu immunization administered today.   Medications reviewed and updated.  No changes recommended at this time.   A referral was ordered for pulmonary.

## 2017-08-30 NOTE — Assessment & Plan Note (Signed)
Stress test negative Chest wall tender - likely musculoskeletal in nature - symptomatic treatment only

## 2017-08-30 NOTE — Assessment & Plan Note (Signed)
Related to sinus issues and probable lung disease Continue current inhalers/allergy medications Will refer to pulmonary

## 2017-09-02 ENCOUNTER — Telehealth: Payer: Self-pay | Admitting: *Deleted

## 2017-09-02 ENCOUNTER — Encounter: Payer: Self-pay | Admitting: Physician Assistant

## 2017-09-02 ENCOUNTER — Ambulatory Visit (INDEPENDENT_AMBULATORY_CARE_PROVIDER_SITE_OTHER): Payer: PPO | Admitting: Physician Assistant

## 2017-09-02 VITALS — BP 104/80 | HR 73 | Ht 66.0 in | Wt 149.0 lb

## 2017-09-02 DIAGNOSIS — H538 Other visual disturbances: Secondary | ICD-10-CM | POA: Diagnosis not present

## 2017-09-02 DIAGNOSIS — E785 Hyperlipidemia, unspecified: Secondary | ICD-10-CM

## 2017-09-02 DIAGNOSIS — I480 Paroxysmal atrial fibrillation: Secondary | ICD-10-CM | POA: Diagnosis not present

## 2017-09-02 DIAGNOSIS — H04123 Dry eye syndrome of bilateral lacrimal glands: Secondary | ICD-10-CM | POA: Diagnosis not present

## 2017-09-02 DIAGNOSIS — R079 Chest pain, unspecified: Secondary | ICD-10-CM

## 2017-09-02 DIAGNOSIS — H01009 Unspecified blepharitis unspecified eye, unspecified eyelid: Secondary | ICD-10-CM | POA: Diagnosis not present

## 2017-09-02 MED ORDER — NITROGLYCERIN 0.4 MG SL SUBL: 0.4000 mg | SUBLINGUAL_TABLET | SUBLINGUAL | 3 refills | Status: DC | PRN

## 2017-09-02 NOTE — Patient Instructions (Addendum)
Medication Instructions:   Continue current medications as prescribed. We are giving you a prescription for Nitroglycerin sublingual tablets to take as needed for chest pain.  If you need to use this, place it under the tongue until it dissolves. Wait 5 minutes before administration of a 2nd dose. If you still have chest pain you can take a 2nd dose. If after another 5 minutes you are still having chest pain, take a 3rd dose and call 911 for further instruction.   Labwork:   none  Testing/Procedures:  Your physician has recommended that you wear an event monitor for 30 days. Event monitors are medical devices that record the heart's electrical activity. Doctors most often [place] these monitors to diagnose arrhythmias. Arrhythmias are problems with the speed or rhythm of the heartbeat. The monitor is a small, portable device. You can wear one while you do your normal daily activities. This is usually used to diagnose what is causing palpitations/syncope (passing out).   Follow-Up:  With Dr. Gwenlyn Found in 2 months  If you need a refill on your cardiac medications before your next appointment, please call your pharmacy.

## 2017-09-02 NOTE — Progress Notes (Signed)
Cardiology Office Note    Date:  09/02/2017   ID:  Kara Ramirez, DOB 02-Sep-1951, MRN [id number]  PCP:  Binnie Rail, MD  Cardiologist:  Dr. Gwenlyn Found   Chief Complaint  Patient presents with  . Follow-up    recent visit to hospital in [facility] for chest pain, here to followup    History of Present Illness:  Kara Ramirez is a 66 y.o. female with PMH of HLD. Under her past medical history list, she does have a history of atrial fibrillation, however this was not documented on previous cardiology note. I went through her previous EKGs, there was a monitor tracing from 03/13/2011 (listed as EKG 03/20/2011 in EPIC) that showed an episode of slow atrial fibrillation. She is not on any systemic anticoagulation or aspirin. She says she frequently takes Excedrin for pain and was told she should not be taking ASA at the same time. She was previously evaluated by Dr. Gwenlyn Found for chest pain in July 2016. Myoview obtained on 06/14/2015 showed EF 49%, low risk study with small moderate intensity fixed distal anterior, apical and inferior defect consistent with soft tissue attenuation, mild global hypokinesis. Echocardiogram obtained on 06/17/2015 showed EF 55-60%, grade 1 DD, mild AI. She was last seen by Dr. Gwenlyn Found on 06/21/2015, she was reassured and given a trial of Protonix. More recently, she has been staying with her daughter in [place]. She presented to the emergency room on 08/07/2017 with chest discomfort in the setting of worsening shortness of breath. It was felt that her shortness of breath is likely related to mild to moderate asthma. She had 2 negative troponin despite prolonged chest pain, d-dimer was negative as well. Chest x-ray negative for acute process. She underwent a dobutamine stress echo which came back negative for ischemia. Although not listed under discharge summary section, however on the follow-up section of discharge AVS, it mentioned 9 seconds of paroxysmal atrial fibrillation during  the hospital stay.  She says she saw a cardiologist Dr. Scarlett Presto in [facility] 2 Fridays ago and was told to follow-up with our cardiology service since we have most of her record. Since discharge from the hospital, she continued to have intermittent chest pain. It is brought on by any emotional distress, at rest or even physical distress. There is no obvious exacerbating factors or alleviating factors. Fortunately the degree and the frequency of the chest pain has not changed much. It is occurring once every few days. We will give her a prescription of nitroglycerin to take as needed basis. However I instructed her to contact us if her chest pain does become more frequent or worsens. Otherwise, the primary question at this point is whether her atrial fibrillation only occurs during stress or is she having intermittent atrial fibrillation when she is well. I plan to obtain a 30 day event monitor to further elicit the frequency of atrial fibrillation. If she is having atrial fibrillation without obvious exacerbating factors, she will need a systemic anticoagulation. She does have chronic chronic fatigue of unknown causes, I do not think her fatigue is related to atrial fibrillation.    Past Medical History:  Diagnosis Date  . Anemia   . Arthritis   . Asthma   . Chest pain   . Chronic headaches   . Cystocele   . Diverticulosis   . Endometriosis   . Fibromyalgia   . Hyperlipidemia   . IBS (irritable bowel syndrome)   . Irritable bowel syndrome with  constipation   . Nephrolithiasis   . Ovarian cyst   . PAF (paroxysmal atrial fibrillation) ([facility])   . Seasonal allergies     Past Surgical History:  Procedure Laterality Date  . BLADDER SUSPENSION    . CATARACT EXTRACTION Bilateral   . celiac artery anuerysym  2011  . kindey stone removal    . KNEE SURGERY Right    right x2  . LUMBAR DISC SURGERY  03/13/2011   T12-L7 PINS AND SCREWS  . TOTAL ABDOMINAL HYSTERECTOMY      . VAGINAL PROLAPSE REPAIR      Current Medications: Outpatient Medications Prior to Visit  Medication Sig Dispense Refill  . albuterol (PROVENTIL HFA;VENTOLIN HFA) 108 (90 Base) MCG/ACT inhaler Inhale 2 puffs into the lungs every 6 (six) hours as needed.    [name] atorvastatin (LIPITOR) 20 MG tablet TAKE 1 TABLET BY MOUTH, ONCE DAILY. 90 tablet 0  . Calcium Carbonate-Vitamin D (CALCIUM 600+D) 600-400 MG-UNIT per tablet Take 1 tablet by mouth 2 (two) times daily.      . cholecalciferol (VITAMIN D) 1000 UNITS tablet Take 1,000 Units by mouth daily.      . Coenzyme Q10 (CO Q 10) 60 MG CAPS Take 1 tablet by mouth 2 (two) times daily.      [name] diltiazem 2 % GEL Apply to rectal area 2-3 times daily 30 g 0  . fluocinonide ointment (LIDEX) 0.05 % Apply 9.62 application topically 2 (two) times daily.    . fluorometholone (FML) 0.1 % ophthalmic ointment Place 1 application into both eyes 3 (three) times daily.    . fluticasone (FLONASE) 50 MCG/ACT nasal spray USE ONE SPRAY IN EACH NOSTRIL MIDDAY FOR CONGESTION. 16 g 5  . fluticasone (FLOVENT HFA) 44 MCG/ACT inhaler Inhale 2 puffs into the lungs 2 (two) times daily. 3 Inhaler 0  . ketotifen (ZADITOR) 0.025 % ophthalmic solution 1 drop 2 (two) times daily.    [name] loratadine (CLARITIN) 10 MG tablet Take 10 mg by mouth daily.      [name] MAGNESIUM SULFATE PO Take 1 tablet by mouth daily.      [name] MANGANESE PO Take 1 tablet by mouth daily.      . montelukast (SINGULAIR) 5 MG chewable tablet CHEW AND SWALLOW ONE TABLET  DAILY TO PREVENT COUGH OR WHEEZE. 60 tablet 5  . naproxen sodium (ALEVE) 220 MG tablet Take 220 mg by mouth daily.     . Omega-3 Fatty Acids (FISH OIL) 1000 MG CAPS Take 1 capsule by mouth daily.    . polyethylene glycol powder (GLYCOLAX/MIRALAX) powder Take 9 grams (1/2 scoop) dissolved in at least 8 ounces of water/juice three times per week. 527 g 2  . PROAIR HFA 108 (90 Base) MCG/ACT inhaler USE 2 PUFFS EVERY 4 HOURS AS NEEDED FOR COUGH OR WHEEZE. MAY  USE 2 PUFFS 10-20 MINUTES PRIOR TO EXERCISE. 8.5 g 0  . tacrolimus (PROTOPIC) 0.1 % ointment Apply 1 application topically 2 (two) times daily.    . Thiamine HCl (VITAMIN B-1) 100 MG tablet Take 100 mg by mouth daily.      [name] Ubiquinol 100 MG CAPS Take 1 capsule by mouth daily.    . vitamin E 400 UNIT capsule Take 400 Units by mouth at bedtime.       No facility-administered medications prior to visit.      Allergies:   Wheat bran; Morphine; Peanut oil; Penicillin g; Protonix [pantoprazole sodium]; Mold extract [trichophyton mentagrophyte]; Molds & smuts; Wheat; Cetirizine; Codeine;  Eggs or egg-derived products; Penicillins; and Pentazocine lactate   Social History   Social History  . Marital status: Married    Spouse name: N/A  . Number of children: 2  . Years of education: N/A   Occupational History  . retired Scientist, clinical (histocompatibility and immunogenetics)   Social History Main Topics  . Smoking status: Never Smoker  . Smokeless tobacco: Never Used  . Alcohol use No  . Drug use: No  . Sexual activity: Not Asked   Other Topics Concern  . None   Social History Narrative  . None     Family History:  The patient's family history includes Anemia in her mother; Arthritis in her father; Asthma in her mother; Colon cancer in her mother; Heart disease in her father, maternal grandfather, and paternal grandfather; Nephrolithiasis in her father.   ROS:   Please see the history of present illness.    ROS All other systems reviewed and are negative.   PHYSICAL EXAM:   VS:  BP 104/80   Pulse 73   Ht 5\' 6"  (1.676 m)   Wt 149 lb (67.6 kg)   BMI 24.05 kg/m    GEN: Well nourished, well developed, in no acute distress  HEENT: normal  Neck: no JVD, carotid bruits, or masses Cardiac: RRR; no murmurs, rubs, or gallops,no edema  Respiratory:  clear to auscultation bilaterally, normal work of breathing GI: soft, nontender, nondistended, + BS MS: no deformity or atrophy  Skin: warm and dry, no rash Neuro:   Alert and Oriented x 3, Strength and sensation are intact Psych: euthymic mood, full affect  Wt Readings from Last 3 Encounters:  09/02/17 149 lb (67.6 kg)  08/30/17 149 lb (67.6 kg)  03/14/17 151 lb (68.5 kg)      Studies/Labs Reviewed:   EKG:  EKG is ordered today.  The ekg ordered today demonstrates Normal sinus rhythm, heart rate 73, single PVC.  Recent Labs: 12/11/2016: ALT 32; BUN 19; Creatinine, Ser 0.77; Hemoglobin 13.1; Platelets 239.0; Potassium 4.0; Sodium 141; TSH 0.62   Lipid Panel    Component Value Date/Time   CHOL 158 12/11/2016 0928   TRIG 95.0 12/11/2016 0928   HDL 64.00 12/11/2016 0928   CHOLHDL 2 12/11/2016 0928   VLDL 19.0 12/11/2016 0928   LDLCALC 75 12/11/2016 0928    Additional studies/ records that were reviewed today include:    Myoview 06/14/2015 Study Highlights    The left ventricular ejection fraction is mildly decreased (45-54%).  Nuclear stress EF: 49%.  There was no ST segment deviation noted during stress.  This is a low risk study.   Low risk stress nuclear study with small, moderate intensity, fixed distal anterior/apical and inferior defects consistent with soft tissue attenuation; no ischemia; EF 49 with mild global hypokinesis.      Echo 06/17/2015 LV EF: 55% -   60%  Study Conclusions  - Left ventricle: The cavity size was mildly dilated. Wall   thickness was normal. Systolic function was normal. The estimated   ejection fraction was in the range of 55% to 60%. Wall motion was   normal; there were no regional wall motion abnormalities. Doppler   parameters are consistent with abnormal left ventricular   relaxation (grade 1 diastolic dysfunction). - Aortic valve: There was mild regurgitation. - Right ventricle: The cavity size was mildly dilated.  Impressions:  - Normal LV function; grade 1 diastolic dysfunction; mild LVE; mild   AI; trace MR, mild RVE.   Dobutamine  stress echo obtained in [facility] 08/08/2017 EF 55-60%, mild AI, mild TR and RV SP 25-30 mmHg Patient achieved a peak heart rate of 136 BPM, representing 89% of the maximum predicted heart rate. No dobutamine induced ischemia noted. Resting wall motion and LV function normal. Hyperdynamic left ventricular function with stress, there was no stress induced wall motion abnormality.  ASSESSMENT:    1. Paroxysmal atrial fibrillation (HCC)   2. Chest pain, unspecified type   3. Hyperlipidemia, unspecified hyperlipidemia type      PLAN:  In order of problems listed above:  1. Paroxysmal atrial fibrillation: Previous EKG in 2014 showed a episode of slow atrial fibrillation during lumbar surgery. During recent admission, she had a 9 second atrial fibrillation, she was also treated for asthma as well. I question if she only has atrial fibrillation during acute distress. I plan to obtain a 30 day monitor to further elicit. If she does not have any recurrence of atrial fibrillation, may consider aspirin only.  2. Chest pain: Recently underwent dobutamine stress echo which did not show any wall motion abnormality  3. Hyperlipidemia: On Lipitor 20 mg daily    Medication Adjustments/Labs and Tests Ordered: Current medicines are reviewed at length with the patient today.  Concerns regarding medicines are outlined above.  Medication changes, Labs and Tests ordered today are listed in the Patient Instructions below. Patient Instructions  Medication Instructions:   Continue current medications as prescribed. We are giving you a prescription for Nitroglycerin sublingual tablets to take as needed for chest pain.  If you need to use this, place it under the tongue until it dissolves. Wait 5 minutes before administration of a 2nd dose. If you still have chest pain you can take a 2nd dose. If after another 5 minutes you are still having chest pain, take a 3rd dose and call 911 for further instruction.   Labwork:    none  Testing/Procedures:  Your physician has recommended that you wear an event monitor for 30 days. Event monitors are medical devices that record the heart's electrical activity. Doctors most often [place] these monitors to diagnose arrhythmias. Arrhythmias are problems with the speed or rhythm of the heartbeat. The monitor is a small, portable device. You can wear one while you do your normal daily activities. This is usually used to diagnose what is causing palpitations/syncope (passing out).   Follow-Up:  With Dr. Gwenlyn Found in 2 months  If you need a refill on your cardiac medications before your next appointment, please call your pharmacy.      Hilbert Corrigan, [place]  09/02/2017 8:19 PM    [address], [place], [place]  [address] Phone: [phone]; Fax: [phone]

## 2017-09-02 NOTE — Telephone Encounter (Signed)
Asked to call patient to arrange 30 day cardiac event monitor to patient.  Please call patient to discuss.  Patient currently scheduled 09/16/17 to have monitor applied in our office.  She prefers to keep this appointment in order to have someone show her how to use it.  She will call to cancel appointment and have a cardiac event monitor shipped to her if their plans change.

## 2017-09-16 ENCOUNTER — Ambulatory Visit (INDEPENDENT_AMBULATORY_CARE_PROVIDER_SITE_OTHER): Payer: PPO

## 2017-09-16 DIAGNOSIS — I480 Paroxysmal atrial fibrillation: Secondary | ICD-10-CM

## 2017-09-17 ENCOUNTER — Institutional Professional Consult (permissible substitution): Payer: PPO | Admitting: Pulmonary Disease

## 2017-09-18 DIAGNOSIS — G4710 Hypersomnia, unspecified: Secondary | ICD-10-CM | POA: Diagnosis not present

## 2017-09-18 DIAGNOSIS — R0789 Other chest pain: Secondary | ICD-10-CM | POA: Diagnosis not present

## 2017-09-18 DIAGNOSIS — J45909 Unspecified asthma, uncomplicated: Secondary | ICD-10-CM | POA: Diagnosis not present

## 2017-09-18 DIAGNOSIS — J3089 Other allergic rhinitis: Secondary | ICD-10-CM | POA: Diagnosis not present

## 2017-09-19 DIAGNOSIS — J019 Acute sinusitis, unspecified: Secondary | ICD-10-CM | POA: Diagnosis not present

## 2017-09-19 DIAGNOSIS — J4540 Moderate persistent asthma, uncomplicated: Secondary | ICD-10-CM | POA: Diagnosis not present

## 2017-09-27 DIAGNOSIS — J300 Vasomotor rhinitis: Secondary | ICD-10-CM | POA: Diagnosis not present

## 2017-09-27 DIAGNOSIS — J3089 Other allergic rhinitis: Secondary | ICD-10-CM | POA: Diagnosis not present

## 2017-09-27 DIAGNOSIS — J301 Allergic rhinitis due to pollen: Secondary | ICD-10-CM | POA: Diagnosis not present

## 2017-09-30 ENCOUNTER — Other Ambulatory Visit: Payer: Self-pay | Admitting: Internal Medicine

## 2017-10-03 DIAGNOSIS — Z01419 Encounter for gynecological examination (general) (routine) without abnormal findings: Secondary | ICD-10-CM | POA: Diagnosis not present

## 2017-10-03 DIAGNOSIS — Z1231 Encounter for screening mammogram for malignant neoplasm of breast: Secondary | ICD-10-CM | POA: Diagnosis not present

## 2017-10-03 DIAGNOSIS — Z6824 Body mass index (BMI) 24.0-24.9, adult: Secondary | ICD-10-CM | POA: Diagnosis not present

## 2017-10-03 LAB — HM MAMMOGRAPHY

## 2017-10-05 ENCOUNTER — Encounter: Payer: Self-pay | Admitting: Family Medicine

## 2017-10-05 ENCOUNTER — Ambulatory Visit: Payer: PPO | Admitting: Family Medicine

## 2017-10-05 VITALS — BP 102/70 | HR 91 | Temp 98.0°F | Ht 66.0 in | Wt 151.8 lb

## 2017-10-05 DIAGNOSIS — J018 Other acute sinusitis: Secondary | ICD-10-CM

## 2017-10-05 MED ORDER — AZITHROMYCIN 250 MG PO TABS: ORAL_TABLET | ORAL | 0 refills | Status: DC

## 2017-10-05 NOTE — Progress Notes (Signed)
   Subjective:    Patient ID: Kara Ramirez, female    DOB: 01-16-1951, 66 y.o.   MRN: [id number]  HPI Here for one week of sinus pressure, ear pressure, PND, and a ST. No fever.    Review of Systems  Constitutional: Negative.   HENT: Positive for congestion, ear pain, postnasal drip, sinus pressure, sinus pain and sore throat.   Eyes: Negative.   Respiratory: Negative.        Objective:   Physical Exam  Constitutional: She appears well-developed and well-nourished. No distress.  HENT:  Right Ear: External ear normal.  Left Ear: External ear normal.  Nose: Nose normal.  Mouth/Throat: Oropharynx is clear and moist.  Eyes: Conjunctivae are normal.  Neck: No thyromegaly present.  Pulmonary/Chest: Effort normal and breath sounds normal. No respiratory distress. She has no wheezes. She has no rales.  Lymphadenopathy:    She has no cervical adenopathy.          Assessment & Plan:  Sinusitis,treat with a Zpack. Add Mucinex prn.  Alysia Penna, MD

## 2017-10-14 ENCOUNTER — Other Ambulatory Visit: Payer: Self-pay | Admitting: Allergy

## 2017-10-14 ENCOUNTER — Telehealth: Payer: Self-pay | Admitting: *Deleted

## 2017-10-14 NOTE — Telephone Encounter (Signed)
Pt of Dr. Gwenlyn Found Hx PAF   In late September, seen in ED at [facility] in [place] for onset of chest pain. Neg dobutamine stress echo Seen for OV on 10/15 by Isaac Laud, and 30 day monitor placed on 10/29   Received serious notification of autotrigger transmission for patient's cardiac event monitor,  day 27 of 30.  Dated 10/12/17 at 3:41 CST.  SVT w peak rate 185 (1 min) w/Artifact/Lead Loss   I contacted patient. She reports she's been doing well, but that the other day (approx time of event) she was getting something out of her car and felt episode of chest tightness which resolved after about 45 mins. She did report symptom on her monitor.  She is scheduled to return for OV on Dec 12th to discuss monitor results.  Pt aware I will seek review of report by provider (Dr. Martinique, [place]) and call her with any recommendations.  She lives in [place] most of the time, lives with daughter and son in law and watches grandchildren full time. States she can be in [place] toward end of week (Thursday or Friday), should she need to be seen.

## 2017-10-14 NOTE — Telephone Encounter (Signed)
D/w Dr. Martinique - he recommended [facility] to follow up as scheduled, notify if any new/worsening symptoms. Communicated recommendations w patient, who verbalized understanding and thanks.

## 2017-10-18 ENCOUNTER — Other Ambulatory Visit: Payer: Self-pay | Admitting: Allergy

## 2017-10-18 ENCOUNTER — Other Ambulatory Visit: Payer: Self-pay

## 2017-10-18 ENCOUNTER — Telehealth: Payer: Self-pay | Admitting: Allergy

## 2017-10-18 MED ORDER — ALBUTEROL SULFATE HFA 108 (90 BASE) MCG/ACT IN AERS: 2.0000 | INHALATION_SPRAY | Freq: Four times a day (QID) | RESPIRATORY_TRACT | 0 refills | Status: DC | PRN

## 2017-10-18 MED ORDER — ALBUTEROL SULFATE HFA 108 (90 BASE) MCG/ACT IN AERS: INHALATION_SPRAY | RESPIRATORY_TRACT | 0 refills | Status: DC

## 2017-10-18 NOTE — Telephone Encounter (Signed)
Pt called and needs proair called into [facility] on [address] and made appointment for mon dec.3 at 1:30 with dr gallagher. [phone]

## 2017-10-18 NOTE — Telephone Encounter (Signed)
Made in error

## 2017-10-18 NOTE — Telephone Encounter (Signed)
Pt advised of 1 refill and reminded of Appointment on Dec 3 @ 1:30

## 2017-10-21 ENCOUNTER — Encounter: Payer: Self-pay | Admitting: Allergy & Immunology

## 2017-10-21 ENCOUNTER — Ambulatory Visit (INDEPENDENT_AMBULATORY_CARE_PROVIDER_SITE_OTHER): Payer: PPO | Admitting: Allergy & Immunology

## 2017-10-21 VITALS — BP 104/64 | HR 95 | Ht 67.0 in | Wt 150.0 lb

## 2017-10-21 DIAGNOSIS — J4530 Mild persistent asthma, uncomplicated: Secondary | ICD-10-CM

## 2017-10-21 DIAGNOSIS — J3089 Other allergic rhinitis: Secondary | ICD-10-CM

## 2017-10-21 DIAGNOSIS — K9049 Malabsorption due to intolerance, not elsewhere classified: Secondary | ICD-10-CM

## 2017-10-21 DIAGNOSIS — J0100 Acute maxillary sinusitis, unspecified: Secondary | ICD-10-CM | POA: Diagnosis not present

## 2017-10-21 DIAGNOSIS — J302 Other seasonal allergic rhinitis: Secondary | ICD-10-CM | POA: Diagnosis not present

## 2017-10-21 MED ORDER — CEFDINIR 300 MG PO CAPS: ORAL_CAPSULE | ORAL | 0 refills | Status: DC

## 2017-10-21 MED ORDER — BUDESONIDE-FORMOTEROL FUMARATE 80-4.5 MCG/ACT IN AERO: 2.0000 | INHALATION_SPRAY | Freq: Two times a day (BID) | RESPIRATORY_TRACT | 5 refills | Status: DC

## 2017-10-21 MED ORDER — FLUTICASONE PROPIONATE 50 MCG/ACT NA SUSP: NASAL | 5 refills | Status: DC

## 2017-10-21 MED ORDER — MONTELUKAST SODIUM 5 MG PO CHEW: CHEWABLE_TABLET | ORAL | 5 refills | Status: DC

## 2017-10-21 NOTE — Patient Instructions (Addendum)
1. Mild persistent asthma, uncomplicated - Lung testing looks great today. - We will change your Flovent to Symbicort 80/4.5 two puffs twice daily (this contains a long acting form of albuterol) - Daily controller medication(s): Singulair 5mg  twice daily (10mg  cut in half) and Symbicort 80/4.56mcg two puffs twice daily with spacer - Prior to physical activity: ProAir 2 puffs 10-15 minutes before physical activity. - Rescue medications: ProAir 4 puffs every 4-6 hours as needed - Asthma control goals:  * Full participation in all desired activities (may need albuterol before activity) * Albuterol use two time or less a week on average (not counting use with activity) * Cough interfering with sleep two time or less a month * Oral steroids no more than once a year * No hospitalizations  2. Chronic sinusitis - failed azithromycin - We will start another antibiotic to cover for sinusitis: cefdinir 300mg  twice daily for 14 days - Add on nasal saline spray (i.e., Simply Saline) or nasal saline lavage (i.e., NeilMed) as needed prior to medicated nasal sprays. - For thick post nasal drainage, add guaifenesin [phone] mg (Mucinex)  twice daily as needed with adequate hydration.  2. Seasonal allergic rhinitis (grasses, molds) - Continue with your Flonase, Claritin, and Pazeo.    3. Food intolerance - Continue to avoid all of your triggering foods.  4. Return in about 4 weeks (around 11/18/2017).   Please inform us of any Emergency Department visits, hospitalizations, or changes in symptoms. Call us before going to the ED for breathing or allergy symptoms since we might be able to fit you in for a sick visit. Feel free to contact us anytime with any questions, problems, or concerns.  It was a pleasure to meet you today! Enjoy the winter season!  Websites that have reliable patient information: 1. American Academy of Asthma, Allergy, and Immunology: www.aaaai.org 2. Food Allergy Research and  Education (FARE): foodallergy.org 3. Mothers of Asthmatics: http://www.asthmacommunitynetwork.org 4. American College of Allergy, Asthma, and Immunology: www.acaai.org

## 2017-10-21 NOTE — Progress Notes (Signed)
FOLLOW UP  Date of Service/Encounter:  10/21/17   Assessment:   Mild persistent asthma, uncomplicated  Seasonal allergic rhinitis (grass, molds)  Food intolerances  Fatigue - with unclear etiology   Asthma Reportables:  Severity: mild persistent  Risk: high Control: not well controlled   Plan/Recommendations:   1. Mild persistent asthma, uncomplicated - Lung testing looks great today, so we can defer on prednisone at this time. - Her fatigue might be related to her need to step up her asthma controller.  - We will change your Flovent to Symbicort 80/4.5 two puffs twice daily. - Daily controller medication(s): Singulair 5mg  twice daily (10mg  cut in half) and Symbicort 80/4.4mcg two puffs twice daily with spacer - Prior to physical activity: ProAir 2 puffs 10-15 minutes before physical activity. - Rescue medications: ProAir 4 puffs every 4-6 hours as needed - Asthma control goals:  * Full participation in all desired activities (may need albuterol before activity) * Albuterol use two time or less a week on average (not counting use with activity) * Cough interfering with sleep two time or less a month * Oral steroids no more than once a year * No hospitalizations  2. Chronic sinusitis - failed azithromycin - We will start another antibiotic to cover for sinusitis: cefdinir 300mg  twice daily for 14 days - There is minimal/no cross reactivity between 3rd generation cephalosporins and penicillin, so she should tolerate the cefdinir without a problem.  - Add on nasal saline spray (i.e., Simply Saline) or nasal saline lavage (i.e., NeilMed) as needed prior to medicated nasal sprays. - For thick post nasal drainage, add guaifenesin [phone] mg (Mucinex)  twice daily as needed with adequate hydration.  2. Seasonal allergic rhinitis (grasses, molds) - Continue with your Flonase, Claritin, and Pazeo.    3. Food intolerance - Continue to avoid all of your triggering  foods.  4. Return in about 4 weeks (around 11/18/2017).  Subjective:   Kara Ramirez is a 66 y.o. female presenting today for follow up of  Chief Complaint  Patient presents with  . Breathing Problem    Pt presents for difficulty catching her breath. Pt recently has visited Cardiology and Pulmonology. Pt has been staying in [place].    Kara Ramirez has a history of the following: Patient Active Problem List   Diagnosis Date Noted  . Shortness of breath 08/30/2017  . Chronic sinusitis 03/14/2017  . Nasal obstruction 03/14/2017  . Severe scoliosis 12/11/2016  . Dark urine 12/11/2016  . Prediabetes 06/06/2016  . Cough 06/06/2016  . Allergic rhinitis 02/08/2016  . Osteoporosis 12/05/2015  . Chest pain 05/27/2015  . Hyperlipidemia 05/27/2015  . 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 Iron deficiency anemia 08/27/2011  . Rectal bleeding 08/23/2011  . Internal hemorrhoids 08/23/2011  . Constipation 08/23/2011    History obtained from: chart review and patient and the patient's husband.  Kara Ramirez's Primary Care Provider is Kara Rail, MD.     Kara Ramirez is a 66 y.o. female presenting for a sick visit. She was last seen in December 2017 by Kara Ramirez. At that time, she was doing fairly well, but was endorsing some coughing with occasional wheezing. Her last testing was performed in 2014 and was positive to grasses and molds. She continued to have sensitivities to wheat and egg.  At that visit, she was continued on Flonase 2 sprays per nostril daily as well as Patanase 2 sprays per nostril twice daily.  She was also continued  Claritin 10 mg daily [name] 1 drop per eye as needed.  For her asthma, it was recommended that she restart her Qvar 40 mcg 2 puffs twice daily. She was also continued on Singulair 5 mg BID.   Since the last visit, she has been living in [place] with one of her children, where she helps out with taking care of the grandchildren. Therefore she started  seeing an [facility] practice there. She has been taking care of her 42yo granddaughter who has a recently repaired cleft palate. However, she has maintained a residence in [place] because she does not like living in the [place]. She feels [place] and she is always happy from a health perspective to get back to [place]. She did have repeat skin testing in November 2018 when she was evaluated by [facility] which showed positives to mold mix A (Alternaria, Cladosporium, Penicillium, Bipolaris, Epicoccum, and Aspergillus) as well as KORT grass mix, Johnson grass, and Marshall grass.   She is concerned today that her asthma is "acting up". However, she has been having some heart problems as well. She reports that she had symptoms with fatigue and an intense feeling of feeling worn out. She also endorses SOB and chest tightness. She was admitted overnight at [facility] for workup of these symptoms (unclear time course) and had a workup that included an EKG. They did have an echocardiogram performed, which was also abnormal. She did have a stress test that demonstrated irregular beats. She did wear a heart monitor for one month, which finished at the end of November. They have not started her on any medications. Her next appointment with her cardiologist in [facility] - Dr. Quay Burow - on December 12th.   Today, she is complaining about nasal congestion as well. She did get an antibiotic on November 17th (azithromycin), which did improve her sinus pressure and nasal discharge. However, she is having problems again with continued nasal congestion. She has not had a fever and is maintaining normal PO intake.   Otherwise, there have been no changes to her past medical history, surgical history, family history, or social history.    Review of Systems: a 14-point review of systems is pertinent for what is mentioned in HPI.  Otherwise,  all other systems were negative. Constitutional: negative other than that listed in the HPI Eyes: negative other than that listed in the HPI Ears, nose, mouth, throat, and face: negative other than that listed in the HPI Respiratory: negative other than that listed in the HPI Cardiovascular: negative other than that listed in the HPI Gastrointestinal: negative other than that listed in the HPI Genitourinary: negative other than that listed in the HPI Integument: negative other than that listed in the HPI Hematologic: negative other than that listed in the HPI Musculoskeletal: negative other than that listed in the HPI Neurological: negative other than that listed in the HPI Allergy/Immunologic: negative other than that listed in the HPI    Objective:   Blood pressure 104/64, pulse 95, height 5\' 7"  (1.702 m), weight 150 lb (68 kg), SpO2 97 %. Body mass index is 23.49 kg/m.   Physical Exam:  General: Alert, interactive, in no acute distress. Talkative. Flight of ideas.  Eyes: No conjunctival injection bilaterally, no discharge on the right, no discharge on the left and no Horner-Trantas dots present. PERRL bilaterally. EOMI without pain. No photophobia.  Ears: Right TM pearly gray with normal light reflex, Left  TM pearly gray with normal light reflex, Right TM intact without perforation and Left TM intact without perforation.  Nose/Throat: External nose within normal limits and septum midline. Turbinates edematous and pale with clear discharge. Posterior oropharynx moderately erythematous with cobblestoning in the posterior oropharynx. Tonsils 2+ without exudates.  Tongue without thrush. Adenopathy: no enlarged lymph nodes appreciated in the anterior cervical, occipital, axillary, epitrochlear, inguinal, or popliteal regions. Lungs: Clear to auscultation without wheezing, rhonchi or rales. No increased work of breathing. CV: Normal S1/S2. No murmurs. Capillary refill <2 seconds.   Skin: Warm and dry, without lesions or rashes. Neuro:   Grossly intact. No focal deficits appreciated. Responsive to questions.  Diagnostic studies:   Spirometry: results normal (FEV1: 2.46/98%, FVC: 3.21/99%, FEV1/FVC: 77%).    Spirometry consistent with normal pattern.   Allergy Studies: none    Salvatore Marvel, MD [place] [facility]

## 2017-10-25 NOTE — Addendum Note (Signed)
Addended by: Herbie Drape on: 10/25/2017 08:33 AM   Modules accepted: Orders

## 2017-10-30 ENCOUNTER — Telehealth: Payer: Self-pay | Admitting: Pharmacist Clinician (PhC)/ Clinical Pharmacy Specialist

## 2017-10-30 ENCOUNTER — Ambulatory Visit: Payer: PPO | Admitting: Cardiovascular Disease

## 2017-10-30 ENCOUNTER — Encounter: Payer: Self-pay | Admitting: Cardiovascular Disease

## 2017-10-30 VITALS — BP 100/68 | HR 72 | Ht 67.0 in | Wt 149.2 lb

## 2017-10-30 DIAGNOSIS — R079 Chest pain, unspecified: Secondary | ICD-10-CM

## 2017-10-30 DIAGNOSIS — I480 Paroxysmal atrial fibrillation: Secondary | ICD-10-CM | POA: Diagnosis not present

## 2017-10-30 MED ORDER — METOPROLOL SUCCINATE ER 50 MG PO TB24: ORAL_TABLET | ORAL | 0 refills | Status: DC

## 2017-10-30 MED ORDER — APIXABAN 5 MG PO TABS: 5.0000 mg | ORAL_TABLET | Freq: Two times a day (BID) | ORAL | 6 refills | Status: DC

## 2017-10-30 NOTE — Progress Notes (Signed)
B

## 2017-10-30 NOTE — Assessment & Plan Note (Signed)
History of hyperlipidemia on statin therapy lipid profile performed 12/11/16 revealing total cholesterol of 58, LDL 75 and HDL of 64.

## 2017-10-30 NOTE — Assessment & Plan Note (Signed)
History of atypical chest pain with Myoview performed 06/14/15 which was normal and a recent dobutamine echo performed at [facility] 08/08/17 which was normal as well. She has minimal risk factors including mild hyperlipidemia and family history sister that had heart disease. I am going to order a coronary CTA to further evaluate.

## 2017-10-30 NOTE — Telephone Encounter (Signed)
Pt was started on Eliquis for atrial fibrillation on Oct 30, 2017  Reviewed patients medication list.  Pt is not currently on any combined P-gp and strong CYP3A4 inhibitors/inducers (ketoconazole, traconazole, ritonavir, carbamazepine, phenytoin, rifampin, St. John's wort).  Reviewed labs.  SCr 0.77, Weight 67.7 kg, CrCl- .077.  Dose appropriate based on age, weight, and SCr.  Hgb and HCT Within Normal Limits  A full discussion of the nature of anticoagulants has been carried out.  A benefit/risk analysis has been presented to the patient, so that they understand the justification for choosing anticoagulation with Eliquis at this time.  The need for compliance is stressed.  Pt is aware to take the medication twice daily.  Side effects of potential bleeding are discussed, including unusual colored urine or stools, coughing up blood or coffee ground emesis, nose bleeds or serious fall or head trauma.  Discussed signs and symptoms of stroke. The patient should avoid any OTC items containing aspirin or ibuprofen.  Avoid alcohol consumption.   Call if any signs of abnormal bleeding.  Discussed financial obligations and resolved any difficulty in obtaining medication.  Patient will have labs drawn today, as most current are about 13 months old.

## 2017-10-30 NOTE — Patient Instructions (Addendum)
Medication Instructions: Your physician recommends that you continue on your current medications as directed. Please refer to the Current Medication list given to you today.  START Eliquis 5 mg twice daily.  Labwork: Your physician recommends that you return for lab work prior to CTA: BMET--to check kidney function.   Testing/Procedures: Coronary CT Angiography (CTA), is a special type of CT scan that uses a computer to produce multi-dimensional views of major blood vessels throughout the body. In CT angiography, a contrast material is injected through an IV to help visualize the blood vessels  Please arrive at the [facility] main entrance of [facility] at              AM (30-45 minutes prior to test start time)  [facility] [place], [place] [address] [phone]  Proceed to the [facility] Radiology Department (First Floor).   Please follow these instructions carefully (unless otherwise directed):   On the Night Before the Test: . Drink plenty of water. . Do not consume any caffeinated/decaffeinated beverages or chocolate 12 hours prior to your test. . Do not take any antihistamines 12 hours prior to your test. . If you take Metformin do not take 24 hours prior to test. . If the patient has contrast allergy: ? Patient will need a prescription for Prednisone and very clear instructions (as follows): 1. Prednisone 50 mg - take 13 hours prior to test 2. Take another Prednisone 50 mg 7 hours prior to test 3. Take another Prednisone 50 mg 1 hour prior to test 4. Take Benadryl 50 mg 1 hour prior to test . Patient must complete all four doses of above prophylactic medications. . Patient will need a ride after test due to Benadryl.  On the Day of the Test: . Drink plenty of water. Do not drink any water within one hour of the test. . Do not eat any food 4 hours prior to the test. . You may take your regular medications prior to the test. . IF  NOT ON A BETA BLOCKER - Take 50 mg of lopressor (metoprolol) one hour before the test. . HOLD Furosemide morning of the test.  After the Test: . Drink plenty of water. . After receiving IV contrast, you may experience a mild flushed feeling. This is normal. . On occasion, you may experience a mild rash up to 24 hours after the test. This is not dangerous. If this occurs, you can take Benadryl 25 mg and increase your fluid intake. . If you experience trouble breathing, this can be serious. If it is severe call 911 IMMEDIATELY. If it is mild, please call our office. . If you take any of these medications: Glipizide/Metformin, Avandament, Glucavance, please do not take 48 hours after completing test.   Follow-Up: We request that you follow-up in: 3 months with an extender and in 6 months with Dr Andria Rhein will receive a reminder letter in the mail two months in advance. If you don't receive a letter, please call our office to schedule the follow-up appointment.  If you need a refill on your cardiac medications before your next appointment, please call your pharmacy.

## 2017-10-30 NOTE — Progress Notes (Signed)
10/30/2017 Kara Ramirez   08-20-1951  [id number]  Primary Physician Binnie Rail, MD Primary Cardiologist: Lorretta Harp MD Garret Reddish, [place], [place]  HPI:  Kara Ramirez is a 66 y.o.  mildly overweight married Caucasian female mother of 2, grandmother of 1 grandchild whose husband is also patient mild is accompanied her today. She is retired from working in [employer]. Her primary care physician is Dr. Billey Gosling. I last saw her in the office 05/27/15. She really has no risk factors other than mild hyperlipidemia on red yeast rice. Her sister did have myocardial infarctions. She's had chest pain off and on for a year which is somewhat atypical. It occurs typically last for minutes at a time with occasional associated shortness of breath. Performed 2-D echocardiography and Myoview stress testing July 2016 which were normal. She was recently seen at [facility] in September with chest pain and shortness of breath. A dobutamine echo was normal. She saw Almyra Deforest [facility] in the  office 09/02/17 who ordered an event monitor that did show some brief runs of PAF.   Current Meds  Medication Sig  . albuterol (PROAIR HFA) 108 (90 Base) MCG/ACT inhaler USE 2 PUFFS EVERY 4 HOURS AS NEEDED FOR COUGH OR WHEEZE. MAY USE 2 PUFFS 10-20 MINUTES PRIOR TO EXERCISE.  [name] atorvastatin (LIPITOR) 20 MG tablet TAKE 1 TABLET BY MOUTH, ONCE DAILY.  . budesonide-formoterol (SYMBICORT) 80-4.5 MCG/ACT inhaler Inhale 2 puffs into the lungs 2 (two) times daily.  . Calcium Carbonate-Vitamin D (CALCIUM 600+D) 600-400 MG-UNIT per tablet Take 1 tablet by mouth 2 (two) times daily.    . cholecalciferol (VITAMIN D) 1000 UNITS tablet Take 1,000 Units by mouth daily.    . Coenzyme Q10 (CO Q 10) 60 MG CAPS Take 1 tablet by mouth 2 (two) times daily.    [name] diltiazem 2 % GEL Apply to rectal area 2-3 times daily  . fluocinonide ointment (LIDEX) 0.05 % Apply 7.03 application topically 2 (two) times daily.  . fluorometholone (FML)  0.1 % ophthalmic ointment Place 1 application into both eyes 3 (three) times daily.  . fluticasone (FLONASE) 50 MCG/ACT nasal spray USE ONE SPRAY IN EACH NOSTRIL MIDDAY FOR CONGESTION.  . fluticasone (FLOVENT HFA) 44 MCG/ACT inhaler Inhale 2 puffs into the lungs 2 (two) times daily.  [name] ketotifen (ZADITOR) 0.025 % ophthalmic solution 1 drop 2 (two) times daily.  [name] loratadine (CLARITIN) 10 MG tablet Take 10 mg by mouth daily.    [name] MAGNESIUM SULFATE PO Take 1 tablet by mouth daily.    [name] MANGANESE PO Take 1 tablet by mouth daily.    . montelukast (SINGULAIR) 5 MG chewable tablet CHEW AND SWALLOW ONE TABLET  DAILY TO PREVENT COUGH OR WHEEZE.  . naproxen sodium (ALEVE) 220 MG tablet Take 220 mg by mouth daily.   . nitroGLYCERIN (NITROSTAT) 0.4 MG SL tablet Place 1 tablet (0.4 mg total) under the tongue every 5 (five) minutes as needed for chest pain.  . Omega-3 Fatty Acids (FISH OIL) 1000 MG CAPS Take 1 capsule by mouth daily.  . polyethylene glycol powder (GLYCOLAX/MIRALAX) powder Take 9 grams (1/2 scoop) dissolved in at least 8 ounces of water/juice three times per week.  . tacrolimus (PROTOPIC) 0.1 % ointment Apply 1 application topically 2 (two) times daily.  . Thiamine HCl (VITAMIN B-1) 100 MG tablet Take 100 mg by mouth daily.    [name] Ubiquinol 100 MG CAPS Take 1 capsule by mouth daily.  [name]  vitamin E 400 UNIT capsule Take 400 Units by mouth at bedtime.       Allergies  Allergen Reactions  . Wheat Bran Anaphylaxis  . Morphine Other (See Comments)    REACTION: tachycardia and anxiety  . Peanut Oil Nausea And Vomiting  . Penicillin G Hives  . Protonix [Pantoprazole Sodium] Nausea And Vomiting  . Mold Extract [Trichophyton Mentagrophyte]   . Molds & Smuts   . Wheat   . Cetirizine Rash    Around face  . Codeine Other (See Comments)    REACTION: dizzy and "groggy in my head"  . Eggs Or Egg-Derived Products Nausea And Vomiting  . Penicillins Rash    Eyes puffy Has taken low dose pcn and no  rx REACTION: rash, SOB  . Pentazocine Lactate Palpitations and Other (See Comments)    Social History   Socioeconomic History  . Marital status: Married    Spouse name: Not on file  . Number of children: 2  . Years of education: Not on file  . Highest education level: Not on file  Social Needs  . Financial resource strain: Not on file  . Food insecurity - worry: Not on file  . Food insecurity - inability: Not on file  . Transportation needs - medical: Not on file  . Transportation needs - non-medical: Not on file  Occupational History  . Occupation: retired    Fish farm manager: [employer]  Tobacco Use  . Smoking status: Never Smoker  . Smokeless tobacco: Never Used  Substance and Sexual Activity  . Alcohol use: No    Alcohol/week: 0.0 oz  . Drug use: No  . Sexual activity: Not on file  Other Topics Concern  . Not on file  Social History Narrative  . Not on file     Review of Systems: General: negative for chills, fever, night sweats or weight changes.  Cardiovascular: negative for chest pain, dyspnea on exertion, edema, orthopnea, palpitations, paroxysmal nocturnal dyspnea or shortness of breath Dermatological: negative for rash Respiratory: negative for cough or wheezing Urologic: negative for hematuria Abdominal: negative for nausea, vomiting, diarrhea, bright red blood per rectum, melena, or hematemesis Neurologic: negative for visual changes, syncope, or dizziness All other systems reviewed and are otherwise negative except as noted above.    Blood pressure 100/68, pulse 72, height 5\' 7"  (1.702 m), weight 149 lb 3.2 oz (67.7 kg).  General appearance: alert and no distress Neck: no adenopathy, no carotid bruit, no JVD, supple, symmetrical, trachea midline and thyroid not enlarged, symmetric, no tenderness/mass/nodules Lungs: clear to auscultation bilaterally Heart: regular rate and rhythm, S1, S2 normal, no murmur, click, rub or gallop Extremities:  extremities normal, atraumatic, no cyanosis or edema Pulses: 2+ and symmetric Skin: Skin color, texture, turgor normal. No rashes or lesions Neurologic: Alert and oriented X 3, normal strength and tone. Normal symmetric reflexes. Normal coordination and gait  EKG not performed today  ASSESSMENT AND PLAN:   Chest pain History of atypical chest pain with Myoview performed 06/14/15 which was normal and a recent dobutamine echo performed at [facility] 08/08/17 which was normal as well. She has minimal risk factors including mild hyperlipidemia and family history sister that had heart disease. I am going to order a coronary CTA to further evaluate.  Hyperlipidemia History of hyperlipidemia on statin therapy lipid profile performed 12/11/16 revealing total cholesterol of 58, LDL 75 and HDL of 64.  Paroxysmal atrial fibrillation (HCC) History of PAF seen on recent event monitor performed  09/16/17. The CHA2DSVASC2 score is   2. I am going to begin her on a novel oral anticoagulant and refer her to the A. fib clinic for further evaluation and treatment.      Lorretta Harp MD FACP,FACC,FAHA, [facility] 10/30/2017 11:09 AM

## 2017-10-30 NOTE — Assessment & Plan Note (Signed)
History of PAF seen on recent event monitor performed 09/16/17. The CHA2DSVASC2 score is   2. I am going to begin her on a novel oral anticoagulant and refer her to the A. fib clinic for further evaluation and treatment.

## 2017-10-31 DIAGNOSIS — H2640 Unspecified secondary cataract: Secondary | ICD-10-CM | POA: Diagnosis not present

## 2017-10-31 DIAGNOSIS — H01009 Unspecified blepharitis unspecified eye, unspecified eyelid: Secondary | ICD-10-CM | POA: Diagnosis not present

## 2017-10-31 DIAGNOSIS — H04123 Dry eye syndrome of bilateral lacrimal glands: Secondary | ICD-10-CM | POA: Diagnosis not present

## 2017-11-01 ENCOUNTER — Telehealth (HOSPITAL_COMMUNITY): Payer: Self-pay | Admitting: *Deleted

## 2017-11-01 NOTE — Telephone Encounter (Signed)
Pt referred from Dr. Gwenlyn Found.  cld to sched appt.  11/07/17

## 2017-11-01 NOTE — Telephone Encounter (Signed)
-----   Message from Juluis Mire, RN sent at 11/01/2017 12:00 PM EST ----- Regarding: ref dr berry Referral from dr berry -

## 2017-11-06 DIAGNOSIS — R079 Chest pain, unspecified: Secondary | ICD-10-CM | POA: Diagnosis not present

## 2017-11-06 DIAGNOSIS — I480 Paroxysmal atrial fibrillation: Secondary | ICD-10-CM | POA: Diagnosis not present

## 2017-11-07 ENCOUNTER — Telehealth: Payer: Self-pay | Admitting: Cardiovascular Disease

## 2017-11-07 ENCOUNTER — Ambulatory Visit (HOSPITAL_COMMUNITY)
Admission: RE | Admit: 2017-11-07 | Discharge: 2017-11-07 | Disposition: A | Discharge status: Home / Self Care | Payer: PPO | Source: Ambulatory Visit | Attending: Nurse Practitioner | Admitting: Nurse Practitioner

## 2017-11-07 ENCOUNTER — Encounter (HOSPITAL_COMMUNITY): Payer: Self-pay | Admitting: Nurse Practitioner

## 2017-11-07 VITALS — BP 122/84 | HR 77 | Ht 67.0 in | Wt 148.8 lb

## 2017-11-07 DIAGNOSIS — N83209 Unspecified ovarian cyst, unspecified side: Secondary | ICD-10-CM | POA: Insufficient documentation

## 2017-11-07 DIAGNOSIS — Z825 Family history of asthma and other chronic lower respiratory diseases: Secondary | ICD-10-CM | POA: Diagnosis not present

## 2017-11-07 DIAGNOSIS — Z800 Family history of malignant neoplasm of digestive organs: Secondary | ICD-10-CM | POA: Insufficient documentation

## 2017-11-07 DIAGNOSIS — Z832 Family history of diseases of the blood and blood-forming organs and certain disorders involving the immune mechanism: Secondary | ICD-10-CM | POA: Diagnosis not present

## 2017-11-07 DIAGNOSIS — K589 Irritable bowel syndrome without diarrhea: Secondary | ICD-10-CM | POA: Diagnosis not present

## 2017-11-07 DIAGNOSIS — Z90710 Acquired absence of both cervix and uterus: Secondary | ICD-10-CM | POA: Diagnosis not present

## 2017-11-07 DIAGNOSIS — Z885 Allergy status to narcotic agent status: Secondary | ICD-10-CM | POA: Insufficient documentation

## 2017-11-07 DIAGNOSIS — Z91012 Allergy to eggs: Secondary | ICD-10-CM | POA: Diagnosis not present

## 2017-11-07 DIAGNOSIS — E785 Hyperlipidemia, unspecified: Secondary | ICD-10-CM | POA: Insufficient documentation

## 2017-11-07 DIAGNOSIS — Z8249 Family history of ischemic heart disease and other diseases of the circulatory system: Secondary | ICD-10-CM | POA: Insufficient documentation

## 2017-11-07 DIAGNOSIS — Z8261 Family history of arthritis: Secondary | ICD-10-CM | POA: Insufficient documentation

## 2017-11-07 DIAGNOSIS — Z888 Allergy status to other drugs, medicaments and biological substances status: Secondary | ICD-10-CM | POA: Diagnosis not present

## 2017-11-07 DIAGNOSIS — Z880 Allergy status to penicillin: Secondary | ICD-10-CM | POA: Insufficient documentation

## 2017-11-07 DIAGNOSIS — Z79899 Other long term (current) drug therapy: Secondary | ICD-10-CM | POA: Insufficient documentation

## 2017-11-07 DIAGNOSIS — Z7901 Long term (current) use of anticoagulants: Secondary | ICD-10-CM | POA: Diagnosis not present

## 2017-11-07 DIAGNOSIS — R002 Palpitations: Secondary | ICD-10-CM | POA: Insufficient documentation

## 2017-11-07 DIAGNOSIS — I480 Paroxysmal atrial fibrillation: Secondary | ICD-10-CM | POA: Insufficient documentation

## 2017-11-07 DIAGNOSIS — Z91018 Allergy to other foods: Secondary | ICD-10-CM | POA: Insufficient documentation

## 2017-11-07 DIAGNOSIS — J45909 Unspecified asthma, uncomplicated: Secondary | ICD-10-CM | POA: Diagnosis not present

## 2017-11-07 DIAGNOSIS — R0789 Other chest pain: Secondary | ICD-10-CM | POA: Diagnosis not present

## 2017-11-07 DIAGNOSIS — Z9889 Other specified postprocedural states: Secondary | ICD-10-CM | POA: Diagnosis not present

## 2017-11-07 DIAGNOSIS — M797 Fibromyalgia: Secondary | ICD-10-CM | POA: Diagnosis not present

## 2017-11-07 DIAGNOSIS — Z91010 Allergy to peanuts: Secondary | ICD-10-CM | POA: Diagnosis not present

## 2017-11-07 LAB — BASIC METABOLIC PANEL
BUN/Creatinine Ratio: 23 (ref 12–28)
BUN: 17 mg/dL (ref 8–27)
CO2: 23 mmol/L (ref 20–29)
Calcium: 9.7 mg/dL (ref 8.7–10.3)
Chloride: 107 mmol/L — ABNORMAL HIGH (ref 96–106)
Creatinine, Ser: 0.74 mg/dL (ref 0.57–1.00)
GFR calc Af Amer: 97 mL/min/{1.73_m2} (ref >59)
GFR calc non Af Amer: 84 mL/min/{1.73_m2} (ref >59)
Glucose: 101 mg/dL — ABNORMAL HIGH (ref 65–99)
Potassium: 4 mmol/L (ref 3.5–5.2)
Sodium: 145 mmol/L — ABNORMAL HIGH (ref 134–144)

## 2017-11-07 NOTE — Telephone Encounter (Signed)
The patient left a VM on my phone stating that she thought she was having her cardiac CT performed today.  But, instead she was seen at the atrial fibrillation clinic instead.  She needs a phone call today letting her know when her appointment is as she is going back to [place] today and not sure when she will be back.  I have sent a high priority message to the precert department and the CT morph department along with a message to contact the patient regarding this.

## 2017-11-08 ENCOUNTER — Other Ambulatory Visit: Payer: Self-pay | Admitting: Allergy

## 2017-11-08 ENCOUNTER — Other Ambulatory Visit: Payer: Self-pay | Admitting: Cardiovascular Disease

## 2017-11-08 MED ORDER — METOPROLOL SUCCINATE ER 50 MG PO TB24: ORAL_TABLET | ORAL | 0 refills | Status: DC

## 2017-11-08 NOTE — Progress Notes (Signed)
Primary Care Physician: Binnie Rail, MD Referring Physician: Dr. Chrystine Oiler is a 66 y.o. female with a h/o palpitations and chest pain that is in the afib clinic for f/u Holter monitor. Pt lives here but stays with her daughter most of the time in [place] to care for the grandchildren for the parents work changing schedules. She was in the hospital in [place] with chest pain and had a negative stress test. Almyra Deforest, PA, mentioned that she had slow  afib on an EKG in 2012, but I do not believe this to be afib as there appears to be some P waves and and looks fairly regular. This appears to be a monitor tracing during back surgery. Also, he mentions 9 seconds of afib mentioned on an d/c summary from [place], but that is not enough burden to warrant anticoagulation. I reviewed the monitor page by page and  she appears to have SR with PVC's , which makes the appearance  of having irregular rhythm. She also had some spells of tachycardia, the longest/fastest on 11/24, pt thinks she was walking the dog. I reviewed with Dr. Rayann Heman and he feels that this is a true SVT and not fib nor flutter. She is pending a cardiac CT ordered by Dr. Gwenlyn Found to further work up her chest pain.   Today, she denies symptoms of palpitations, chest pain, shortness of breath, orthopnea, PND, lower extremity edema, dizziness, presyncope, syncope, or neurologic sequela. The patient is tolerating medications without difficulties and is otherwise without complaint today.   Past Medical History:  Diagnosis Date  . Anemia   . Arthritis   . Asthma   . Chest pain   . Chronic headaches   . Cystocele   . Diverticulosis   . Endometriosis   . Fibromyalgia   . Hyperlipidemia   . IBS (irritable bowel syndrome)   . Irritable bowel syndrome with constipation   . Nephrolithiasis   . Ovarian cyst   . PAF (paroxysmal atrial fibrillation) ([place])   . Seasonal allergies    Past Surgical History:  Procedure Laterality  Date  . BLADDER SUSPENSION    . CATARACT EXTRACTION Bilateral   . celiac artery anuerysym  2011  . kindey stone removal    . KNEE SURGERY Right    right x2  . LUMBAR DISC SURGERY  03/13/2011   T12-L7 PINS AND SCREWS  . TOTAL ABDOMINAL HYSTERECTOMY    . VAGINAL PROLAPSE REPAIR      Current Outpatient Medications  Medication Sig Dispense Refill  . albuterol (PROAIR HFA) 108 (90 Base) MCG/ACT inhaler USE 2 PUFFS EVERY 4 HOURS AS NEEDED FOR COUGH OR WHEEZE. MAY USE 2 PUFFS 10-20 MINUTES PRIOR TO EXERCISE. 8.5 g 0  . apixaban (ELIQUIS) 5 MG TABS tablet Take 1 tablet (5 mg total) by mouth 2 (two) times daily. 60 tablet 6  . atorvastatin (LIPITOR) 20 MG tablet TAKE 1 TABLET BY MOUTH, ONCE DAILY. 90 tablet 0  . budesonide-formoterol (SYMBICORT) 80-4.5 MCG/ACT inhaler Inhale 2 puffs into the lungs 2 (two) times daily. 1 Inhaler 5  . Calcium Carbonate-Vitamin D (CALCIUM 600+D) 600-400 MG-UNIT per tablet Take 1 tablet by mouth 2 (two) times daily.      . cholecalciferol (VITAMIN D) 1000 UNITS tablet Take 1,000 Units by mouth daily.      . Coenzyme Q10 (CO Q 10) 60 MG CAPS Take 1 tablet by mouth 2 (two) times daily.      [name] diltiazem  2 % GEL Apply to rectal area 2-3 times daily 30 g 0  . fluocinonide ointment (LIDEX) 0.05 % Apply 1.06 application topically 2 (two) times daily.    . fluticasone (FLONASE) 50 MCG/ACT nasal spray USE ONE SPRAY IN EACH NOSTRIL MIDDAY FOR CONGESTION. 16 g 5  . ketotifen (ZADITOR) 0.025 % ophthalmic solution 1 drop 2 (two) times daily.    [name] loratadine (CLARITIN) 10 MG tablet Take 10 mg by mouth daily.      [name] MAGNESIUM SULFATE PO Take 1 tablet by mouth daily.      [name] MANGANESE PO Take 1 tablet by mouth daily.      . montelukast (SINGULAIR) 5 MG chewable tablet CHEW AND SWALLOW ONE TABLET  DAILY TO PREVENT COUGH OR WHEEZE. 60 tablet 5  . naproxen sodium (ALEVE) 220 MG tablet Take 220 mg by mouth daily.     . nitroGLYCERIN (NITROSTAT) 0.4 MG SL tablet Place 1 tablet (0.4 mg  total) under the tongue every 5 (five) minutes as needed for chest pain. 25 tablet 3  . Omega-3 Fatty Acids (FISH OIL) 1000 MG CAPS Take 1 capsule by mouth daily.    . polyethylene glycol powder (GLYCOLAX/MIRALAX) powder Take 9 grams (1/2 scoop) dissolved in at least 8 ounces of water/juice three times per week. 527 g 2  . tacrolimus (PROTOPIC) 0.1 % ointment Apply 1 application topically 2 (two) times daily.    . Thiamine HCl (VITAMIN B-1) 100 MG tablet Take 100 mg by mouth daily.      [name] Ubiquinol 100 MG CAPS Take 1 capsule by mouth daily.    . vitamin E 400 UNIT capsule Take 400 Units by mouth at bedtime.      . fluorometholone (FML) 0.1 % ophthalmic ointment Place 1 application into both eyes 3 (three) times daily.    . metoprolol succinate (TOPROL-XL) 50 MG 24 hr tablet Take tablet 1 hour prior to your CTA. 1 tablet 0   No current facility-administered medications for this encounter.     Allergies  Allergen Reactions  . Wheat Bran Anaphylaxis  . Morphine Other (See Comments)    REACTION: tachycardia and anxiety  . Peanut Oil Nausea And Vomiting  . Penicillin G Hives  . Protonix [Pantoprazole Sodium] Nausea And Vomiting  . Mold Extract [Trichophyton Mentagrophyte]   . Molds & Smuts   . Wheat   . Cetirizine Rash    Around face  . Codeine Other (See Comments)    REACTION: dizzy and "groggy in my head"  . Eggs Or Egg-Derived Products Nausea And Vomiting  . Penicillins Rash    Eyes puffy Has taken low dose pcn and no rx REACTION: rash, SOB  . Pentazocine Lactate Palpitations and Other (See Comments)    Social History   Socioeconomic History  . Marital status: Married    Spouse name: Not on file  . Number of children: 2  . Years of education: Not on file  . Highest education level: Not on file  Social Needs  . Financial resource strain: Not on file  . Food insecurity - worry: Not on file  . Food insecurity - inability: Not on file  . Transportation needs - medical: Not  on file  . Transportation needs - non-medical: Not on file  Occupational History  . Occupation: retired    Fish farm manager: [employer]  Tobacco Use  . Smoking status: Never Smoker  . Smokeless tobacco: Never Used  Substance and Sexual Activity  . Alcohol  use: No    Alcohol/week: 0.0 oz  . Drug use: No  . Sexual activity: Not on file  Other Topics Concern  . Not on file  Social History Narrative  . Not on file    Family History  Problem Relation Age of Onset  . Colon cancer Mother   . Anemia Mother        Aplastic anemia-Purpra  . Asthma Mother   . Heart disease Father   . Arthritis Father   . Nephrolithiasis Father   . Heart disease Maternal Grandfather   . Heart disease Paternal Grandfather   . Allergic rhinitis Neg Hx   . Angioedema Neg Hx   . Eczema Neg Hx   . Immunodeficiency Neg Hx   . Urticaria Neg Hx     ROS- All systems are reviewed and negative except as per the HPI above  Physical Exam: Vitals:   11/07/17 0938  BP: 122/84  Pulse: 77  Weight: 148 lb 12.8 oz (67.5 kg)  Height: 5\' 7"  (1.702 m)   Wt Readings from Last 3 Encounters:  11/07/17 148 lb 12.8 oz (67.5 kg)  10/30/17 149 lb 3.2 oz (67.7 kg)  10/21/17 150 lb (68 kg)    Labs: Lab Results  Component Value Date   NA 145 (H) 11/06/2017   K 4.0 11/06/2017   CL 107 (H) 11/06/2017   CO2 23 11/06/2017   GLUCOSE 101 (H) 11/06/2017   BUN 17 11/06/2017   CREATININE 0.74 11/06/2017   CALCIUM 9.7 11/06/2017   MG 1.7 01/10/2010   Lab Results  Component Value Date   INR 1.23 01/09/2010   Lab Results  Component Value Date   CHOL 158 12/11/2016   HDL 64.00 12/11/2016   LDLCALC 75 12/11/2016   TRIG 95.0 12/11/2016     GEN- The patient is well appearing, alert and oriented x 3 today.   Head- normocephalic, atraumatic Eyes-  Sclera clear, conjunctiva pink Ears- hearing intact Oropharynx- clear Neck- supple, no JVP Lymph- no cervical lymphadenopathy Lungs- Clear to ausculation  bilaterally, normal work of breathing Heart- Regular rate and rhythm, no murmurs, rubs or gallops, PMI not laterally displaced GI- soft, NT, ND, + BS Extremities- no clubbing, cyanosis, or edema MS- no significant deformity or atrophy Skin- no rash or lesion Psych- euthymic mood, full affect Neuro- strength and sensation are intact  EKG- NSR 77 bpm, 84 ms, 418 ms Holter monitor reviewed in detail as well as old Ekg's Notes recorded by Lorretta Harp, MD on 10/29/2017 at 9:55 AM EST 1. NSR 2. ST 3. Short runs of PSVT 4. Freq PVCs   Assessment and Plan: 1. Palpitations I do not believe the pt to have afib by review of recent holter monitor and some questionable readings  confirmed by Dr. Rayann Heman to be a SVT EKG by 2012 by my interpretation does not seem to fit criteria for afib Another mention of possible 9 secs of afib on a recent d/c summary from [place], however, that is not enough burden to anticoagulate or to carry a dx of afib and I do not have strip to review  Therefore, I do not believe she needs to be on anticoagulation and pt has been told to stop She does have intermittent PVC's/SVT and may benefit from lose dose BB If intermittent SVT persists or PVC burden increases could be considered by EP for an appropriate ablation  2. Chest pain Recent negative stress test in [facility] Pending coronary CTA per Dr. Gwenlyn Found  Geroge Baseman [facility] [address] [name], [place] [address] [phone]   .

## 2017-11-13 ENCOUNTER — Other Ambulatory Visit: Payer: Self-pay

## 2017-11-13 MED ORDER — ALBUTEROL SULFATE HFA 108 (90 BASE) MCG/ACT IN AERS: INHALATION_SPRAY | RESPIRATORY_TRACT | 0 refills | Status: DC

## 2017-11-13 NOTE — Telephone Encounter (Signed)
RX for ProAir sent into Pt's pharmacy.

## 2017-11-19 DIAGNOSIS — Z889 Allergy status to unspecified drugs, medicaments and biological substances status: Secondary | ICD-10-CM | POA: Diagnosis not present

## 2017-11-19 DIAGNOSIS — E785 Hyperlipidemia, unspecified: Secondary | ICD-10-CM | POA: Diagnosis not present

## 2017-11-19 DIAGNOSIS — J45909 Unspecified asthma, uncomplicated: Secondary | ICD-10-CM | POA: Diagnosis not present

## 2017-11-19 DIAGNOSIS — Z1389 Encounter for screening for other disorder: Secondary | ICD-10-CM | POA: Diagnosis not present

## 2017-11-23 ENCOUNTER — Other Ambulatory Visit: Payer: Self-pay | Admitting: Allergy

## 2017-11-23 DIAGNOSIS — J309 Allergic rhinitis, unspecified: Secondary | ICD-10-CM

## 2017-11-23 DIAGNOSIS — H1010 Acute atopic conjunctivitis, unspecified eye: Secondary | ICD-10-CM

## 2017-11-23 DIAGNOSIS — J4530 Mild persistent asthma, uncomplicated: Secondary | ICD-10-CM

## 2017-11-25 NOTE — Addendum Note (Signed)
Encounter addended by: Sherran Needs, NP on: 11/25/2017 8:33 AM  Actions taken: LOS modified

## 2017-12-06 ENCOUNTER — Other Ambulatory Visit: Payer: Self-pay | Admitting: Cardiovascular Disease

## 2017-12-06 NOTE — Telephone Encounter (Signed)
REFILL 

## 2017-12-09 ENCOUNTER — Telehealth: Payer: Self-pay | Admitting: Cardiovascular Disease

## 2017-12-09 MED ORDER — METOPROLOL TARTRATE 50 MG PO TABS: ORAL_TABLET | ORAL | 0 refills | Status: DC

## 2017-12-09 NOTE — Telephone Encounter (Signed)
New message    Pt c/o medication issue:  1. Name of Medication: metoprolol succinate (TOPROL-XL) 50 MG 24 hr tablet  2. How are you currently taking this medication (dosage and times per day)? TAKE 1 TABLET BY MOUTH 1 HOUR PRIOR TO YOUR CTA  3. Are you having a reaction (difficulty breathing--STAT)? no  4. What is your medication issue? Patient says she also received a prescription for 3 at her pharmacy and wants to know should she take that too Please call

## 2017-12-09 NOTE — Telephone Encounter (Signed)
Returned call to patient. Explained that metoprolol succinate is the incorrect metoprolol that she needs for coronary CTA and she will need the short acting version of this medication - metoprolol tartrate 50mg . Rx(s) sent to pharmacy electronically.

## 2017-12-10 ENCOUNTER — Ambulatory Visit (HOSPITAL_COMMUNITY)
Admission: RE | Admit: 2017-12-10 | Discharge: 2017-12-10 | Disposition: A | Discharge status: Home / Self Care | Payer: PPO | Source: Ambulatory Visit | Attending: Cardiovascular Disease | Admitting: Cardiovascular Disease

## 2017-12-10 DIAGNOSIS — I480 Paroxysmal atrial fibrillation: Secondary | ICD-10-CM | POA: Diagnosis not present

## 2017-12-10 DIAGNOSIS — R079 Chest pain, unspecified: Secondary | ICD-10-CM | POA: Diagnosis not present

## 2017-12-10 DIAGNOSIS — R918 Other nonspecific abnormal finding of lung field: Secondary | ICD-10-CM | POA: Diagnosis not present

## 2017-12-10 DIAGNOSIS — R911 Solitary pulmonary nodule: Secondary | ICD-10-CM | POA: Insufficient documentation

## 2017-12-10 MED ORDER — IOPAMIDOL (ISOVUE-370) INJECTION 76%
INTRAVENOUS | Status: AC
  Administered 2017-12-10: 80 mL
  Filled 2017-12-10: qty 100

## 2017-12-10 MED ORDER — NITROGLYCERIN 0.4 MG SL SUBL
SUBLINGUAL_TABLET | SUBLINGUAL | Status: AC
  Filled 2017-12-10: qty 2

## 2017-12-10 MED ORDER — METOPROLOL TARTRATE 5 MG/5ML IV SOLN
5.0000 mg | INTRAVENOUS | Status: DC | PRN
  Administered 2017-12-10: 10 mg via INTRAVENOUS
  Administered 2017-12-10: 5 mg via INTRAVENOUS
  Filled 2017-12-10 (×3): qty 5

## 2017-12-10 MED ORDER — METOPROLOL TARTRATE 5 MG/5ML IV SOLN
INTRAVENOUS | Status: AC
  Administered 2017-12-10: 5 mg via INTRAVENOUS
  Filled 2017-12-10: qty 15

## 2017-12-10 MED ORDER — NITROGLYCERIN 0.4 MG SL SUBL
0.4000 mg | SUBLINGUAL_TABLET | SUBLINGUAL | Status: DC | PRN
  Administered 2017-12-10: 0.8 mg via SUBLINGUAL
  Filled 2017-12-10 (×2): qty 25

## 2017-12-10 NOTE — Patient Instructions (Addendum)
Test(s) ordered today. Your results will be released to [place] (or called to you) after review, usually within 72hours after test completion. If any changes need to be made, you will be notified at that same time.  All other Health Maintenance issues reviewed.   All recommended immunizations and age-appropriate screenings are up-to-date or discussed.  No immunizations administered today.   Medications reviewed and updated.  No changes recommended at this time.   Please followup in 1 year with me.  Also schedule a wellness visit.   Health Maintenance, Female Adopting a healthy lifestyle and getting preventive care can go a long way to promote health and wellness. Talk with your health care provider about what schedule of regular examinations is right for you. This is a good chance for you to check in with your provider about disease prevention and staying healthy. In between checkups, there are plenty of things you can do on your own. Experts have done a lot of research about which lifestyle changes and preventive measures are most likely to keep you healthy. Ask your health care provider for more information. Weight and diet Eat a healthy diet  Be sure to include plenty of vegetables, fruits, low-fat dairy products, and lean protein.  Do not eat a lot of foods high in solid fats, added sugars, or salt.  Get regular exercise. This is one of the most important things you can do for your health. ? Most adults should exercise for at least 150 minutes each week. The exercise should increase your heart rate and make you sweat (moderate-intensity exercise). ? Most adults should also do strengthening exercises at least twice a week. This is in addition to the moderate-intensity exercise.  Maintain a healthy weight  Body mass index (BMI) is a measurement that can be used to identify possible weight problems. It estimates body fat based on height and weight. Your health care provider can help  determine your BMI and help you achieve or maintain a healthy weight.  For females 63 years of age and older: ? A BMI below 18.5 is considered underweight. ? A BMI of 18.5 to 24.9 is normal. ? A BMI of 25 to 29.9 is considered overweight. ? A BMI of 30 and above is considered obese.  Watch levels of cholesterol and blood lipids  You should start having your blood tested for lipids and cholesterol at 68 years of age, then have this test every 5 years.  You may need to have your cholesterol levels checked more often if: ? Your lipid or cholesterol levels are high. ? You are older than 68 years of age. ? You are at high risk for heart disease.  Cancer screening Lung Cancer  Lung cancer screening is recommended for adults 49-14 years old who are at high risk for lung cancer because of a history of smoking.  A yearly low-dose CT scan of the lungs is recommended for people who: ? Currently smoke. ? Have quit within the past 15 years. ? Have at least a 30-pack-year history of smoking. A pack year is smoking an average of one pack of cigarettes a day for 1 year.  Yearly screening should continue until it has been 15 years since you quit.  Yearly screening should stop if you develop a health problem that would prevent you from having lung cancer treatment.  Breast Cancer  Practice breast self-awareness. This means understanding how your breasts normally appear and feel.  It also means doing regular breast self-exams. Let  your health care provider know about any changes, no matter how small.  If you are in your 20s or 30s, you should have a clinical breast exam (CBE) by a health care provider every 1-3 years as part of a regular health exam.  If you are 73 or older, have a CBE every year. Also consider having a breast X-ray (mammogram) every year.  If you have a family history of breast cancer, talk to your health care provider about genetic screening.  If you are at high risk for  breast cancer, talk to your health care provider about having an MRI and a mammogram every year.  Breast cancer gene (BRCA) assessment is recommended for women who have family members with BRCA-related cancers. BRCA-related cancers include: ? Breast. ? Ovarian. ? Tubal. ? Peritoneal cancers.  Results of the assessment will determine the need for genetic counseling and BRCA1 and BRCA2 testing.  Cervical Cancer Your health care provider may recommend that you be screened regularly for cancer of the pelvic organs (ovaries, uterus, and vagina). This screening involves a pelvic examination, including checking for microscopic changes to the surface of your cervix (Pap test). You may be encouraged to have this screening done every 3 years, beginning at age 63.  For women ages 101-65, health care providers may recommend pelvic exams and Pap testing every 3 years, or they may recommend the Pap and pelvic exam, combined with testing for human papilloma virus (HPV), every 5 years. Some types of HPV increase your risk of cervical cancer. Testing for HPV may also be done on women of any age with unclear Pap test results.  Other health care providers may not recommend any screening for nonpregnant women who are considered low risk for pelvic cancer and who do not have symptoms. Ask your health care provider if a screening pelvic exam is right for you.  If you have had past treatment for cervical cancer or a condition that could lead to cancer, you need Pap tests and screening for cancer for at least 20 years after your treatment. If Pap tests have been discontinued, your risk factors (such as having a new sexual partner) need to be reassessed to determine if screening should resume. Some women have medical problems that increase the chance of getting cervical cancer. In these cases, your health care provider may recommend more frequent screening and Pap tests.  Colorectal Cancer  This type of cancer can be  detected and often prevented.  Routine colorectal cancer screening usually begins at 68 years of age and continues through 68 years of age.  Your health care provider may recommend screening at an earlier age if you have risk factors for colon cancer.  Your health care provider may also recommend using home test kits to check for hidden blood in the stool.  A small camera at the end of a tube can be used to examine your colon directly (sigmoidoscopy or colonoscopy). This is done to check for the earliest forms of colorectal cancer.  Routine screening usually begins at age 81.  Direct examination of the colon should be repeated every 5-10 years through 68 years of age. However, you may need to be screened more often if early forms of precancerous polyps or small growths are found.  Skin Cancer  Check your skin from head to toe regularly.  Tell your health care provider about any new moles or changes in moles, especially if there is a change in a mole's shape or color.  Also tell your health care provider if you have a mole that is larger than the size of a pencil eraser.  Always use sunscreen. Apply sunscreen liberally and repeatedly throughout the day.  Protect yourself by wearing long sleeves, pants, a wide-brimmed hat, and sunglasses whenever you are outside.  Heart disease, diabetes, and high blood pressure  High blood pressure causes heart disease and increases the risk of stroke. High blood pressure is more likely to develop in: ? People who have blood pressure in the high end of the normal range (130-139/85-89 mm Hg). ? People who are overweight or obese. ? People who are African American.  If you are 39-32 years of age, have your blood pressure checked every 3-5 years. If you are 64 years of age or older, have your blood pressure checked every year. You should have your blood pressure measured twice-once when you are at a hospital or clinic, and once when you are not at a  hospital or clinic. Record the average of the two measurements. To check your blood pressure when you are not at a hospital or clinic, you can use: ? An automated blood pressure machine at a pharmacy. ? A home blood pressure monitor.  If you are between 81 years and 80 years old, ask your health care provider if you should take aspirin to prevent strokes.  Have regular diabetes screenings. This involves taking a blood sample to check your fasting blood sugar level. ? If you are at a normal weight and have a low risk for diabetes, have this test once every three years after 68 years of age. ? If you are overweight and have a high risk for diabetes, consider being tested at a younger age or more often. Preventing infection Hepatitis B  If you have a higher risk for hepatitis B, you should be screened for this virus. You are considered at high risk for hepatitis B if: ? You were born in a country where hepatitis B is common. Ask your health care provider which countries are considered high risk. ? Your parents were born in a high-risk country, and you have not been immunized against hepatitis B (hepatitis B vaccine). ? You have HIV or AIDS. ? You use needles to inject street drugs. ? You live with someone who has hepatitis B. ? You have had sex with someone who has hepatitis B. ? You get hemodialysis treatment. ? You take certain medicines for conditions, including cancer, organ transplantation, and autoimmune conditions.  Hepatitis C  Blood testing is recommended for: ? Everyone born from [birth year] through 1966. ? Anyone with known risk factors for hepatitis C.  Sexually transmitted infections (STIs)  You should be screened for sexually transmitted infections (STIs) including gonorrhea and chlamydia if: ? You are sexually active and are younger than 68 years of age. ? You are older than 68 years of age and your health care provider tells you that you are at risk for this type of  infection. ? Your sexual activity has changed since you were last screened and you are at an increased risk for chlamydia or gonorrhea. Ask your health care provider if you are at risk.  If you do not have HIV, but are at risk, it may be recommended that you take a prescription medicine daily to prevent HIV infection. This is called pre-exposure prophylaxis (PrEP). You are considered at risk if: ? You are sexually active and do not regularly use condoms or know the HIV status of  your partner(s). ? You take drugs by injection. ? You are sexually active with a partner who has HIV.  Talk with your health care provider about whether you are at high risk of being infected with HIV. If you choose to begin PrEP, you should first be tested for HIV. You should then be tested every 3 months for as long as you are taking PrEP. Pregnancy  If you are premenopausal and you may become pregnant, ask your health care provider about preconception counseling.  If you may become pregnant, take 400 to 800 micrograms (mcg) of folic acid every day.  If you want to prevent pregnancy, talk to your health care provider about birth control (contraception). Osteoporosis and menopause  Osteoporosis is a disease in which the bones lose minerals and strength with aging. This can result in serious bone fractures. Your risk for osteoporosis can be identified using a bone density scan.  If you are 59 years of age or older, or if you are at risk for osteoporosis and fractures, ask your health care provider if you should be screened.  Ask your health care provider whether you should take a calcium or vitamin D supplement to lower your risk for osteoporosis.  Menopause may have certain physical symptoms and risks.  Hormone replacement therapy may reduce some of these symptoms and risks. Talk to your health care provider about whether hormone replacement therapy is right for you. Follow these instructions at home:  Schedule  regular health, dental, and eye exams.  Stay current with your immunizations.  Do not use any tobacco products including cigarettes, chewing tobacco, or electronic cigarettes.  If you are pregnant, do not drink alcohol.  If you are breastfeeding, limit how much and how often you drink alcohol.  Limit alcohol intake to no more than 1 drink per day for nonpregnant women. One drink equals 12 ounces of beer, 5 ounces of wine, or 1 ounces of hard liquor.  Do not use street drugs.  Do not share needles.  Ask your health care provider for help if you need support or information about quitting drugs.  Tell your health care provider if you often feel depressed.  Tell your health care provider if you have ever been abused or do not feel safe at home. This information is not intended to replace advice given to you by your health care provider. Make sure you discuss any questions you have with your health care provider. Document Released: 05/21/2011 Document Revised: 04/12/2016 Document Reviewed: 08/09/2015 Elsevier Interactive Patient Education  [employer].

## 2017-12-10 NOTE — Progress Notes (Signed)
Subjective:    Patient ID: Kara Ramirez, female    DOB: [birth year], 67 y.o.   MRN: [id number]  HPI She is here for a physical exam.   She had chest pain and cardiology ordered a Ct cardiac scan.  Her calcium score was 125, which is 80th percentile for age and sex.  Non obstructive CAD, normal aorta.   CT scan shows LLL lung nodule.  Impression stated: 13 mm nodular area in the left lung base posteriorly contiguous with areas of scarring. I favor this represents nodular scarring, but recommend follow-up chest CT in 6 months to ensure stability.  She has never smoked.  She states she did have several infections when she was a young child and did have imaging at one point and scarring was seen from what she recalls.  Chronic nasal congestion:  She has seen ENT and allergy.  A Ct of her sinuses was clear.  She is taking allergy medications.  She has chronic nasal congestion and postnasal drip.  She does experience a cough because of the postnasal drip.  The symptoms are fairly constant.  Medications and allergies reviewed with patient and updated if appropriate.  Patient Active Problem List   Diagnosis Date Noted  . Lung nodule 12/10/2017  . Paroxysmal atrial fibrillation ([place]) 10/30/2017  . Shortness of breath 08/30/2017  . Chronic sinusitis 03/14/2017  . Nasal obstruction 03/14/2017  . Severe scoliosis 12/11/2016  . Dark urine 12/11/2016  . Prediabetes 06/06/2016  . Cough 06/06/2016  . Allergic rhinitis 02/08/2016  . Osteoporosis 12/05/2015  . Chest pain 05/27/2015  . Hyperlipidemia 05/27/2015  . 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 Iron deficiency anemia 08/27/2011  . Rectal bleeding 08/23/2011  . Internal hemorrhoids 08/23/2011  . Constipation 08/23/2011    Current Outpatient Medications on File Prior to Visit  Medication Sig Dispense Refill  . albuterol (PROAIR HFA) 108 (90 Base) MCG/ACT inhaler USE 2 PUFFS EVERY 4 HOURS AS NEEDED FOR COUGH OR WHEEZE. MAY USE 2 PUFFS  10-20 MINUTES PRIOR TO EXERCISE. 8.5 g 0  . apixaban (ELIQUIS) 5 MG TABS tablet Take 1 tablet (5 mg total) by mouth 2 (two) times daily. 60 tablet 6  . atorvastatin (LIPITOR) 20 MG tablet TAKE 1 TABLET BY MOUTH, ONCE DAILY. 90 tablet 0  . budesonide-formoterol (SYMBICORT) 80-4.5 MCG/ACT inhaler Inhale 2 puffs into the lungs 2 (two) times daily. 1 Inhaler 5  . Calcium Carbonate-Vitamin D (CALCIUM 600+D) 600-400 MG-UNIT per tablet Take 1 tablet by mouth 2 (two) times daily.      . cholecalciferol (VITAMIN D) 1000 UNITS tablet Take 1,000 Units by mouth daily.      . Coenzyme Q10 (CO Q 10) 60 MG CAPS Take 1 tablet by mouth 2 (two) times daily.      [name] diltiazem 2 % GEL Apply to rectal area 2-3 times daily 30 g 0  . fluocinonide ointment (LIDEX) 0.05 % Apply 6.96 application topically 2 (two) times daily.    . fluorometholone (FML) 0.1 % ophthalmic ointment Place 1 application into both eyes 3 (three) times daily.    . fluticasone (FLONASE) 50 MCG/ACT nasal spray USE ONE SPRAY IN EACH NOSTRIL MIDDAY FOR CONGESTION. 16 g 5  . ketotifen (ZADITOR) 0.025 % ophthalmic solution 1 drop 2 (two) times daily.    [name] loratadine (CLARITIN) 10 MG tablet Take 10 mg by mouth daily.      [name] MAGNESIUM SULFATE PO Take 1 tablet by mouth daily.      [name]  MANGANESE PO Take 1 tablet by mouth daily.      . metoprolol tartrate (LOPRESSOR) 50 MG tablet Take ONE TABLET by mouth ONE HOUR prior to test. 1 tablet 0  . montelukast (SINGULAIR) 5 MG chewable tablet CHEW TWO TABLETS BY MOUTH DAILY TO PREVENT COUGH OR WHEEZE 60 tablet 4  . naproxen sodium (ALEVE) 220 MG tablet Take 220 mg by mouth daily.     . Omega-3 Fatty Acids (FISH OIL) 1000 MG CAPS Take 1 capsule by mouth daily.    . polyethylene glycol powder (GLYCOLAX/MIRALAX) powder Take 9 grams (1/2 scoop) dissolved in at least 8 ounces of water/juice three times per week. 527 g 2  . tacrolimus (PROTOPIC) 0.1 % ointment Apply 1 application topically 2 (two) times daily.    .  Thiamine HCl (VITAMIN B-1) 100 MG tablet Take 100 mg by mouth daily.      [name] Ubiquinol 100 MG CAPS Take 1 capsule by mouth daily.    . vitamin E 400 UNIT capsule Take 400 Units by mouth at bedtime.      . nitroGLYCERIN (NITROSTAT) 0.4 MG SL tablet Place 1 tablet (0.4 mg total) under the tongue every 5 (five) minutes as needed for chest pain. 25 tablet 3   No current facility-administered medications on file prior to visit.     Past Medical History:  Diagnosis Date  . Anemia   . Arthritis   . Asthma   . Chest pain   . Chronic headaches   . Cystocele   . Diverticulosis   . Endometriosis   . Fibromyalgia   . Hyperlipidemia   . IBS (irritable bowel syndrome)   . Irritable bowel syndrome with constipation   . Nephrolithiasis   . Ovarian cyst   . PAF (paroxysmal atrial fibrillation) ([place])   . Seasonal allergies     Past Surgical History:  Procedure Laterality Date  . BLADDER SUSPENSION    . CATARACT EXTRACTION Bilateral   . celiac artery anuerysym  2011  . kindey stone removal    . KNEE SURGERY Right    right x2  . LUMBAR DISC SURGERY  03/13/2011   T12-L7 PINS AND SCREWS  . TOTAL ABDOMINAL HYSTERECTOMY    . VAGINAL PROLAPSE REPAIR      Social History   Socioeconomic History  . Marital status: Married    Spouse name: Not on file  . Number of children: 2  . Years of education: Not on file  . Highest education level: Not on file  Social Needs  . Financial resource strain: Not on file  . Food insecurity - worry: Not on file  . Food insecurity - inability: Not on file  . Transportation needs - medical: Not on file  . Transportation needs - non-medical: Not on file  Occupational History  . Occupation: retired    Fish farm manager: [employer]  Tobacco Use  . Smoking status: Never Smoker  . Smokeless tobacco: Never Used  Substance and Sexual Activity  . Alcohol use: No    Alcohol/week: 0.0 oz  . Drug use: No  . Sexual activity: Not on file  Other Topics Concern    . Not on file  Social History Narrative  . Not on file    Family History  Problem Relation Age of Onset  . Colon cancer Mother   . Anemia Mother        Aplastic anemia-Purpra  . Asthma Mother   . Heart disease Father   . Arthritis  Father   . Nephrolithiasis Father   . Heart disease Maternal Grandfather   . Heart disease Paternal Grandfather   . Allergic rhinitis Neg Hx   . Angioedema Neg Hx   . Eczema Neg Hx   . Immunodeficiency Neg Hx   . Urticaria Neg Hx     Review of Systems  Constitutional: Negative for chills and fever.  HENT: Positive for congestion (chronic), postnasal drip and sinus pressure.   Eyes: Positive for visual disturbance (blurry vision some days).  Respiratory: Positive for cough (from PND), chest tightness (intermittent) and wheezing (occ). Negative for shortness of breath.   Cardiovascular: Positive for palpitations (occ). Negative for chest pain and leg swelling.  Gastrointestinal: Positive for constipation and diarrhea (BM varies constipation and diarrhea). Negative for abdominal pain, blood in stool and nausea.  Genitourinary: Negative for dysuria and hematuria.  Musculoskeletal: Positive for back pain.  Skin: Negative for color change and rash.  Neurological: Positive for headaches (from sinuses).  Psychiatric/Behavioral: Negative for dysphoric mood. The patient is nervous/anxious.        Objective:   Vitals:   12/11/17 0848  BP: 116/80  Pulse: 73  Resp: 16  Temp: 98 F (36.7 C)  SpO2: 98%   Filed Weights   12/11/17 0848  Weight: 151 lb (68.5 kg)   Body mass index is 23.65 kg/m.  Wt Readings from Last 3 Encounters:  12/11/17 151 lb (68.5 kg)  11/07/17 148 lb 12.8 oz (67.5 kg)  10/30/17 149 lb 3.2 oz (67.7 kg)     Physical Exam Constitutional: She appears well-developed and well-nourished. No distress.  HENT:  Head: Normocephalic and atraumatic.  Right Ear: External ear normal. Normal ear canal and TM Left Ear: External ear  normal.  Normal ear canal and TM Mouth/Throat: Oropharynx is clear and moist.  Eyes: Conjunctivae and EOM are normal.  Neck: Neck supple. No tracheal deviation present. No thyromegaly present.  No carotid bruit  Cardiovascular: Normal rate, regular rhythm and normal heart sounds.   No murmur heard.  No edema. Pulmonary/Chest: Effort normal and breath sounds normal. No respiratory distress. She has no wheezes. She has no rales.  Breast: deferred to Gyn Abdominal: Soft. She exhibits no distension. There is no tenderness.  Lymphadenopathy: She has no cervical adenopathy.  Skin: Skin is warm and dry. She is not diaphoretic.  Psychiatric: She has a normal mood and affect. Her behavior is normal.        Assessment & Plan:   Physical exam: Screening blood work  ordered Immunizations  Td due - reviewed, shingrix discussed, others up to date Colonoscopy  Up to date  Mammogram   Up to date - Dr Matthew Saras Gyn  Up to date  Dexa  Done by gyn Eye exams  Up to date  EKG     Done 10/2017 Exercise   Some exercise - irregular in cold weather - stressed regular exercise Weight   BMI is normal Skin  No concerns Substance abuse    none   Paroxysmal atrial fibrillation: Paroxysmal atrial fibrillation seen on event monitor 08/2017.  Dr. Alvester Chou placed her on oral anticoagulant and referred her to the A. fib clinic for further evaluation and treatment We will check CBC, CMP Management per cardiology   See Problem List for Assessment and Plan of chronic medical problems.   FU in 12 months

## 2017-12-11 ENCOUNTER — Telehealth: Payer: Self-pay | Admitting: Cardiovascular Disease

## 2017-12-11 ENCOUNTER — Other Ambulatory Visit (INDEPENDENT_AMBULATORY_CARE_PROVIDER_SITE_OTHER): Payer: PPO

## 2017-12-11 ENCOUNTER — Ambulatory Visit (INDEPENDENT_AMBULATORY_CARE_PROVIDER_SITE_OTHER): Payer: PPO | Admitting: Internal Medicine

## 2017-12-11 ENCOUNTER — Encounter: Payer: Self-pay | Admitting: Internal Medicine

## 2017-12-11 VITALS — BP 116/80 | HR 73 | Temp 98.0°F | Resp 16 | Ht 67.0 in | Wt 151.0 lb

## 2017-12-11 DIAGNOSIS — R7303 Prediabetes: Secondary | ICD-10-CM | POA: Diagnosis not present

## 2017-12-11 DIAGNOSIS — R911 Solitary pulmonary nodule: Secondary | ICD-10-CM | POA: Diagnosis not present

## 2017-12-11 DIAGNOSIS — E7849 Other hyperlipidemia: Secondary | ICD-10-CM

## 2017-12-11 DIAGNOSIS — I480 Paroxysmal atrial fibrillation: Secondary | ICD-10-CM | POA: Diagnosis not present

## 2017-12-11 DIAGNOSIS — Z0000 Encounter for general adult medical examination without abnormal findings: Secondary | ICD-10-CM

## 2017-12-11 DIAGNOSIS — M810 Age-related osteoporosis without current pathological fracture: Secondary | ICD-10-CM | POA: Diagnosis not present

## 2017-12-11 DIAGNOSIS — J329 Chronic sinusitis, unspecified: Secondary | ICD-10-CM | POA: Diagnosis not present

## 2017-12-11 DIAGNOSIS — R05 Cough: Secondary | ICD-10-CM | POA: Diagnosis not present

## 2017-12-11 DIAGNOSIS — R059 Cough, unspecified: Secondary | ICD-10-CM

## 2017-12-11 LAB — CBC WITH DIFFERENTIAL/PLATELET
Basophils Absolute: 0.1 10*3/uL (ref 0.0–0.1)
Basophils Relative: 0.8 % (ref 0.0–3.0)
Eosinophils Absolute: 0.1 10*3/uL (ref 0.0–0.7)
Eosinophils Relative: 1.9 % (ref 0.0–5.0)
HCT: 40 % (ref 36.0–46.0)
Hemoglobin: 13.2 g/dL (ref 12.0–15.0)
Lymphocytes Relative: 24.8 % (ref 12.0–46.0)
Lymphs Abs: 1.8 10*3/uL (ref 0.7–4.0)
MCHC: 33 g/dL (ref 30.0–36.0)
MCV: 85.6 fl (ref 78.0–100.0)
Monocytes Absolute: 0.4 10*3/uL (ref 0.1–1.0)
Monocytes Relative: 6.2 % (ref 3.0–12.0)
Neutro Abs: 4.7 10*3/uL (ref 1.4–7.7)
Neutrophils Relative %: 66.3 % (ref 43.0–77.0)
Platelets: 267 10*3/uL (ref 150.0–400.0)
RBC: 4.67 Mil/uL (ref 3.87–5.11)
RDW: 15.2 % (ref 11.5–15.5)
WBC: 7.1 10*3/uL (ref 4.0–10.5)

## 2017-12-11 LAB — LIPID PANEL
Cholesterol: 159 mg/dL (ref 0–200)
HDL: 72.5 mg/dL (ref >39.00)
LDL Cholesterol: 72 mg/dL (ref 0–99)
NonHDL: 86.87
Total CHOL/HDL Ratio: 2
Triglycerides: 73 mg/dL (ref 0.0–149.0)
VLDL: 14.6 mg/dL (ref 0.0–40.0)

## 2017-12-11 LAB — HEMOGLOBIN A1C: Hgb A1c MFr Bld: 5.8 % (ref 4.6–6.5)

## 2017-12-11 LAB — COMPREHENSIVE METABOLIC PANEL
ALT: 38 U/L — ABNORMAL HIGH (ref 0–35)
AST: 28 U/L (ref 0–37)
Albumin: 4.4 g/dL (ref 3.5–5.2)
Alkaline Phosphatase: 78 U/L (ref 39–117)
BUN: 15 mg/dL (ref 6–23)
CO2: 24 mEq/L (ref 19–32)
Calcium: 9.7 mg/dL (ref 8.4–10.5)
Chloride: 105 mEq/L (ref 96–112)
Creatinine, Ser: 0.86 mg/dL (ref 0.40–1.20)
GFR: 69.82 mL/min (ref >60.00)
Glucose, Bld: 94 mg/dL (ref 70–99)
Potassium: 4.1 mEq/L (ref 3.5–5.1)
Sodium: 143 mEq/L (ref 135–145)
Total Bilirubin: 0.6 mg/dL (ref 0.2–1.2)
Total Protein: 7.7 g/dL (ref 6.0–8.3)

## 2017-12-11 LAB — TSH: TSH: 0.61 u[IU]/mL (ref 0.35–4.50)

## 2017-12-11 NOTE — Assessment & Plan Note (Signed)
Check lipid panel  Continue daily statin Regular exercise and healthy diet encouraged  

## 2017-12-11 NOTE — Telephone Encounter (Signed)
Patient made aware of results and recommendations-chest CT already ordered by PCP for repeat in 6 months.  Labs completed at PCP today as well.      Patient reports she was recently seen by Roderic Palau NP in Afib clinic and was told she did not have Afib and didn't need to take Eliquis.  Patient states she was not comfortable stopping this until Dr. Gwenlyn Found said this was okay.   Also states it was mentioned to start her on metoprolol in the future in order to keep her "rhythm" in control.      Advised I would send message to Dr. Gwenlyn Found to review.   Patient verbalized understanding.      OV with Roderic Palau NP 12/20:  Assessment and Plan: 1. Palpitations I do not believe the pt to have afib by review of recent holter monitor and some questionable readings  confirmed by Dr. Rayann Heman to be a SVT EKG by 2012 by my interpretation does not seem to fit criteria for afib Another mention of possible 9 secs of afib on a recent d/c summary from [place], however, that is not enough burden to anticoagulate or to carry a dx of afib and I do not have strip to review  Therefore, I do not believe she needs to be on anticoagulation and pt has been told to stop She does have intermittent PVC's/SVT and may benefit from lose dose BB If intermittent SVT persists or PVC burden increases could be considered by EP for an appropriate ablation

## 2017-12-11 NOTE — Telephone Encounter (Signed)
New message  Patient calling to check on results from test she had yesterday 12/10/2017

## 2017-12-11 NOTE — Assessment & Plan Note (Addendum)
Lung nodule seen on cardiac CT scan Will order Ct to be done in 6 months - f/u of 13 mm LLL nodule Low risk, never smoker

## 2017-12-11 NOTE — Assessment & Plan Note (Signed)
Check a1c Low sugar / carb diet Stressed regular exercise   

## 2017-12-11 NOTE — Assessment & Plan Note (Signed)
Chronic, related to postnasal drip/allergies Following with allergy taking allergy medications daily

## 2017-12-11 NOTE — Assessment & Plan Note (Signed)
Chronic PND, nasal congestion, sinus pressure, headache Has seen ENT and allergy Taking allergy medications

## 2017-12-11 NOTE — Assessment & Plan Note (Addendum)
dexa done by gyn, management per them Encouraged regular exercise Taking calcium and vitamin D

## 2017-12-12 ENCOUNTER — Encounter: Payer: Self-pay | Admitting: Internal Medicine

## 2017-12-16 NOTE — Telephone Encounter (Signed)
No need for anticoagulation

## 2017-12-23 DIAGNOSIS — J329 Chronic sinusitis, unspecified: Secondary | ICD-10-CM | POA: Diagnosis not present

## 2017-12-27 NOTE — Progress Notes (Addendum)
Subjective:   Kara Ramirez is a 67 y.o. female who presents for an Initial Medicare Annual Wellness Visit.  Review of Systems    No ROS.  Medicare Wellness Visit. Additional risk factors are reflected in the social history.   Cardiac Risk Factors include: advanced age (>72men, >28 women);dyslipidemia;hypertension Sleep patterns: feels rested on waking, gets up 1 times nightly to void and sleeps 7 hours nightly.   Home Safety/Smoke Alarms: Feels safe in home. Smoke alarms in place.  Living environment; residence and Firearm Safety: 2-story house, no firearms, Lives with husband, no needs for DME, good support system. Seat Belt Safety/Bike Helmet: Wears seat belt.    Objective:    Today's Vitals   12/30/17 1324 12/30/17 1330  BP: 136/72   Pulse: 81   Resp: 18   SpO2: 98%   Weight: 154 lb (69.9 kg)   Height: 5\' 7"  (1.702 m)   PainSc:  2    Body mass index is 24.12 kg/m.  Advanced Directives 12/30/2017 08/16/2015  Does Patient Have a Medical Advance Directive? Yes Yes  Type of Paramedic of [place];Living will Healthcare Power of Attorney    Current Medications (verified) Outpatient Encounter Medications as of 12/30/2017  Medication Sig  . albuterol (PROAIR HFA) 108 (90 Base) MCG/ACT inhaler USE 2 PUFFS EVERY 4 HOURS AS NEEDED FOR COUGH OR WHEEZE. MAY USE 2 PUFFS 10-20 MINUTES PRIOR TO EXERCISE.  [name] apixaban (ELIQUIS) 5 MG TABS tablet Take 1 tablet (5 mg total) by mouth 2 (two) times daily.  [name] atorvastatin (LIPITOR) 20 MG tablet TAKE 1 TABLET BY MOUTH, ONCE DAILY.  . budesonide-formoterol (SYMBICORT) 80-4.5 MCG/ACT inhaler Inhale 2 puffs into the lungs 2 (two) times daily.  . Calcium Carbonate-Vitamin D (CALCIUM 600+D) 600-400 MG-UNIT per tablet Take 1 tablet by mouth 2 (two) times daily.    . cholecalciferol (VITAMIN D) 1000 UNITS tablet Take 1,000 Units by mouth daily.    . Coenzyme Q10 (CO Q 10) 60 MG CAPS Take 1 tablet by mouth 2 (two) times  daily.    . fluocinonide ointment (LIDEX) 0.05 % Apply 5.40 application topically 2 (two) times daily.  . fluorometholone (FML) 0.1 % ophthalmic ointment Place 1 application into both eyes 3 (three) times daily.  . fluticasone (FLONASE) 50 MCG/ACT nasal spray USE ONE SPRAY IN EACH NOSTRIL MIDDAY FOR CONGESTION.  [name] ketotifen (ZADITOR) 0.025 % ophthalmic solution 1 drop 2 (two) times daily.  [name] loratadine (CLARITIN) 10 MG tablet Take 10 mg by mouth daily.    [name] MAGNESIUM SULFATE PO Take 1 tablet by mouth daily.    [name] MANGANESE PO Take 1 tablet by mouth daily.    . metoprolol tartrate (LOPRESSOR) 50 MG tablet Take ONE TABLET by mouth ONE HOUR prior to test.  . montelukast (SINGULAIR) 5 MG chewable tablet CHEW TWO TABLETS BY MOUTH DAILY TO PREVENT COUGH OR WHEEZE  . naproxen sodium (ALEVE) 220 MG tablet Take 220 mg by mouth daily.   . Omega-3 Fatty Acids (FISH OIL) 1000 MG CAPS Take 1 capsule by mouth daily.  . polyethylene glycol powder (GLYCOLAX/MIRALAX) powder Take 9 grams (1/2 scoop) dissolved in at least 8 ounces of water/juice three times per week.  . tacrolimus (PROTOPIC) 0.1 % ointment Apply 1 application topically 2 (two) times daily.  . Thiamine HCl (VITAMIN B-1) 100 MG tablet Take 100 mg by mouth daily.    [name] Ubiquinol 100 MG CAPS Take 1 capsule by mouth daily.  . vitamin  E 400 UNIT capsule Take 400 Units by mouth at bedtime.    . nitroGLYCERIN (NITROSTAT) 0.4 MG SL tablet Place 1 tablet (0.4 mg total) under the tongue every 5 (five) minutes as needed for chest pain.  [name] Zoster Vaccine Adjuvanted [facility]) injection Inject 0.5 mLs into the muscle once for 1 dose.  . [DISCONTINUED] atorvastatin (LIPITOR) 20 MG tablet TAKE 1 TABLET BY MOUTH, ONCE DAILY.  . [DISCONTINUED] diltiazem 2 % GEL Apply to rectal area 2-3 times daily (Patient not taking: Reported on 12/30/2017)   No facility-administered encounter medications on file as of 12/30/2017.     Allergies (verified) Wheat bran; Morphine;  Peanut oil; Penicillin g; Protonix [pantoprazole sodium]; Mold extract [trichophyton mentagrophyte]; Molds & smuts; Wheat; Cetirizine; Codeine; Eggs or egg-derived products; Penicillins; and Pentazocine lactate   History: Past Medical History:  Diagnosis Date  . Anemia   . Arthritis   . Asthma   . Chest pain   . Chronic headaches   . Cystocele   . Diverticulosis   . Endometriosis   . Fibromyalgia   . Hyperlipidemia   . IBS (irritable bowel syndrome)   . Irritable bowel syndrome with constipation   . Nephrolithiasis   . Ovarian cyst   . PAF (paroxysmal atrial fibrillation) ([place])   . Seasonal allergies    Past Surgical History:  Procedure Laterality Date  . BLADDER SUSPENSION    . CATARACT EXTRACTION Bilateral   . celiac artery anuerysym  2011  . kindey stone removal    . KNEE SURGERY Right    right x2  . LUMBAR DISC SURGERY  03/13/2011   T12-L7 PINS AND SCREWS  . TOTAL ABDOMINAL HYSTERECTOMY    . VAGINAL PROLAPSE REPAIR     Family History  Problem Relation Age of Onset  . Colon cancer Mother   . Anemia Mother        Aplastic anemia-Purpra  . Asthma Mother   . Heart disease Father   . Arthritis Father   . Nephrolithiasis Father   . Heart disease Maternal Grandfather   . Heart disease Paternal Grandfather   . Allergic rhinitis Neg Hx   . Angioedema Neg Hx   . Eczema Neg Hx   . Immunodeficiency Neg Hx   . Urticaria Neg Hx    Social History   Socioeconomic History  . Marital status: Married    Spouse name: None  . Number of children: 2  . Years of education: None  . Highest education level: None  Social Needs  . Financial resource strain: Not hard at all  . Food insecurity - worry: Never true  . Food insecurity - inability: Never true  . Transportation needs - medical: No  . Transportation needs - non-medical: No  Occupational History  . Occupation: retired    Fish farm manager: [employer]  Tobacco Use  . Smoking status: Never Smoker  . Smokeless  tobacco: Never Used  Substance and Sexual Activity  . Alcohol use: No    Alcohol/week: 0.0 oz  . Drug use: No  . Sexual activity: No  Other Topics Concern  . None  Social History Narrative  . None    Tobacco Counseling Counseling given: Not Answered  Activities of Daily Living In your present state of health, do you have any difficulty performing the following activities: 12/30/2017  Hearing? N  Vision? N  Difficulty concentrating or making decisions? N  Walking or climbing stairs? N  Dressing or bathing? N  Doing errands, shopping? N  Preparing Food and eating ? N  Using the Toilet? N  In the past six months, have you accidently leaked urine? N  Do you have problems with loss of bowel control? N  Managing your Medications? N  Managing your Finances? N  Housekeeping or managing your Housekeeping? N  Some recent data might be hidden     Immunizations and Health Maintenance Immunization History  Administered Date(s) Administered  . Influenza, High Dose Seasonal PF 12/05/2015, 08/30/2017  . Influenza, Quadrivalent, Recombinant, Inj, Pf 10/29/2013, 10/04/2014  . Pneumococcal Conjugate-13 01/27/2014  . Pneumococcal Polysaccharide-23 12/26/2016   Health Maintenance Due  Topic Date Due  . TETANUS/TDAP  04/27/1969    Patient Care Team: Binnie Rail, MD as PCP - General (Internal Medicine) Lorretta Harp, MD as Consulting Physician (Cardiology) Rana Snare, MD as Consulting Physician (Urology)  Indicate any recent Medical Services you may have received from other than [facility] providers in the past year (date may be approximate).     Assessment:   This is a routine wellness examination for [place]. Physical assessment deferred to PCP.   Hearing/Vision screen Hearing Screening Comments: Able to hear conversational tones w/o difficulty. No issues reported.  Passed whisper test  Vision Screening Comments: appointment yearly   Dietary issues and exercise  activities discussed: Current Exercise Habits: Home exercise routine, Type of exercise: walking, Time (Minutes): 45, Intensity: Mild, Exercise limited by: orthopedic condition(s) Diet (meal preparation, eat out, water intake, caffeinated beverages, dairy products, fruits and vegetables): in general, a "healthy" diet   , reported poor appetite.  Discussed drinking nutritional supplements (coupons were provided), encouraged patient to increase daily water intake.   Goals    . Patient Stated     Work on relaxing when I am riding in the car. Enjoy life and family.      Depression Screen PHQ 2/9 Scores 12/30/2017 12/11/2016  PHQ - 2 Score 0 0  PHQ- 9 Score 0 -    Fall Risk Fall Risk  12/30/2017 12/11/2016  Falls in the past year? No Yes  Number falls in past yr: - 1  Injury with Fall? - No  Risk for fall due to : - Impaired balance/gait   Cognitive Function: MMSE - Mini Mental State Exam 12/30/2017  Not completed: Refused        Screening Tests Health Maintenance  Topic Date Due  . TETANUS/TDAP  04/27/1969  . MAMMOGRAM  12/30/2018 (Originally 07/23/2015)  . COLONOSCOPY  08/15/2020  . INFLUENZA VACCINE  Completed  . DEXA SCAN  Completed  . Hepatitis C Screening  Completed  . PNA vac Low Risk Adult  Completed     Plan:     Shingrix vaccine prescription was sent to [facility] on [address] per patient's request.  Continue doing brain stimulating activities (puzzles, reading, adult coloring books, staying active) to keep memory sharp.   Continue to eat heart healthy diet (full of fruits, vegetables, whole grains, lean protein, water--limit salt, fat, and sugar intake) and increase physical activity as tolerated.   I have personally reviewed and noted the following in the patient's chart:   . Medical and social history . Use of alcohol, tobacco or illicit drugs  . Current medications and supplements . Functional ability and status . Nutritional status . Physical  activity . Advanced directives . List of other physicians . Vitals . Screenings to include cognitive, depression, and falls . Referrals and appointments  In addition, I have reviewed and discussed with patient  certain preventive protocols, quality metrics, and best practice recommendations. A written personalized care plan for preventive services as well as general preventive health recommendations were provided to patient.     Michiel Cowboy, RN   12/30/2017    Medical screening examination/treatment/procedure(s) were performed by non-physician practitioner and as supervising physician I was immediately available for consultation/collaboration. I agree with above. Binnie Rail, MD

## 2017-12-28 ENCOUNTER — Other Ambulatory Visit: Payer: Self-pay | Admitting: Internal Medicine

## 2017-12-30 ENCOUNTER — Ambulatory Visit (INDEPENDENT_AMBULATORY_CARE_PROVIDER_SITE_OTHER): Payer: PPO | Admitting: *Deleted

## 2017-12-30 VITALS — BP 136/72 | HR 81 | Resp 18 | Ht 67.0 in | Wt 154.0 lb

## 2017-12-30 DIAGNOSIS — Z0000 Encounter for general adult medical examination without abnormal findings: Secondary | ICD-10-CM

## 2017-12-30 MED ORDER — ZOSTER VAC RECOMB ADJUVANTED 50 MCG/0.5ML IM SUSR: 0.5000 mL | Freq: Once | INTRAMUSCULAR | 1 refills | Status: AC

## 2017-12-30 NOTE — Patient Instructions (Signed)
Continue doing brain stimulating activities (puzzles, reading, adult coloring books, staying active) to keep memory sharp.   Continue to eat heart healthy diet (full of fruits, vegetables, whole grains, lean protein, water--limit salt, fat, and sugar intake) and increase physical activity as tolerated.   Kara Ramirez , Thank you for taking time to come for your Medicare Wellness Visit. I appreciate your ongoing commitment to your health goals. Please review the following plan we discussed and let me know if I can assist you in the future.   These are the goals we discussed: Goals    . Patient Stated     Work on relaxing when I am riding in the car. Enjoy life and family.       This is a list of the screening recommended for you and due dates:  Health Maintenance  Topic Date Due  . Tetanus Vaccine  04/27/1969  . Mammogram  12/30/2018*  . Colon Cancer Screening  08/15/2020  . Flu Shot  Completed  . DEXA scan (bone density measurement)  Completed  .  Hepatitis C: One time screening is recommended by Center for Disease Control  (CDC) for  adults born from [birth year] through 1966.   Completed  . Pneumonia vaccines  Completed  *Topic was postponed. The date shown is not the original due date.    Stress and Stress Management Stress is a normal reaction to life events. It is what you feel when life demands more than you are used to or more than you can handle. Some stress can be useful. For example, the stress reaction can help you catch the last bus of the day, study for a test, or meet a deadline at work. But stress that occurs too often or for too long can cause problems. It can affect your emotional health and interfere with relationships and normal daily activities. Too much stress can weaken your immune system and increase your risk for physical illness. If you already have a medical problem, stress can make it worse. What are the causes? All sorts of life events may cause stress. An event that  causes stress for one person may not be stressful for another person. Major life events commonly cause stress. These may be positive or negative. Examples include losing your job, moving into a new home, getting married, having a baby, or losing a loved one. Less obvious life events may also cause stress, especially if they occur day after day or in combination. Examples include working long hours, driving in traffic, caring for children, being in debt, or being in a difficult relationship. What are the signs or symptoms? Stress may cause emotional symptoms including, the following:  Anxiety. This is feeling worried, afraid, on edge, overwhelmed, or out of control.  Anger. This is feeling irritated or impatient.  Depression. This is feeling sad, down, helpless, or guilty.  Difficulty focusing, remembering, or making decisions.  Stress may cause physical symptoms, including the following:  Aches and pains. These may affect your head, neck, back, stomach, or other areas of your body.  Tight muscles or clenched jaw.  Low energy or trouble sleeping.  Stress may cause unhealthy behaviors, including the following:  Eating to feel better (overeating) or skipping meals.  Sleeping too little, too much, or both.  Working too much or putting off tasks (procrastination).  Smoking, drinking alcohol, or using drugs to feel better.  How is this diagnosed? Stress is diagnosed through an assessment by your health care provider. Your health  care provider will ask questions about your symptoms and any stressful life events.Your health care provider will also ask about your medical history and may order blood tests or other tests. Certain medical conditions and medicine can cause physical symptoms similar to stress. Mental illness can cause emotional symptoms and unhealthy behaviors similar to stress. Your health care provider may refer you to a mental health professional for further evaluation. How is  this treated? Stress management is the recommended treatment for stress.The goals of stress management are reducing stressful life events and coping with stress in healthy ways. Techniques for reducing stressful life events include the following:  Stress identification. Self-monitor for stress and identify what causes stress for you. These skills may help you to avoid some stressful events.  Time management. Set your priorities, keep a calendar of events, and learn to say "no." These tools can help you avoid making too many commitments.  Techniques for coping with stress include the following:  Rethinking the problem. Try to think realistically about stressful events rather than ignoring them or overreacting. Try to find the positives in a stressful situation rather than focusing on the negatives.  Exercise. Physical exercise can release both physical and emotional tension. The key is to find a form of exercise you enjoy and do it regularly.  Relaxation techniques. These relax the body and mind. Examples include yoga, meditation, tai chi, biofeedback, deep breathing, progressive muscle relaxation, listening to music, being out in nature, journaling, and other hobbies. Again, the key is to find one or more that you enjoy and can do regularly.  Healthy lifestyle. Eat a balanced diet, get plenty of sleep, and do not smoke. Avoid using alcohol or drugs to relax.  Strong support network. Spend time with family, friends, or other people you enjoy being around.Express your feelings and talk things over with someone you trust.  Counseling or talktherapy with a mental health professional may be helpful if you are having difficulty managing stress on your own. Medicine is typically not recommended for the treatment of stress.Talk to your health care provider if you think you need medicine for symptoms of stress. Follow these instructions at home:  Keep all follow-up visits as directed by your health  care provider.  Take all medicines as directed by your health care provider. Contact a health care provider if:  Your symptoms get worse or you start having new symptoms.  You feel overwhelmed by your problems and can no longer manage them on your own. Get help right away if:  You feel like hurting yourself or someone else. This information is not intended to replace advice given to you by your health care provider. Make sure you discuss any questions you have with your health care provider. Document Released: 05/01/2001 Document Revised: 04/12/2016 Document Reviewed: 06/30/2013 Elsevier Interactive Patient Education  2017 [employer].

## 2017-12-31 ENCOUNTER — Encounter: Payer: Self-pay | Admitting: Internal Medicine

## 2017-12-31 DIAGNOSIS — M8580 Other specified disorders of bone density and structure, unspecified site: Secondary | ICD-10-CM | POA: Insufficient documentation

## 2018-01-01 ENCOUNTER — Encounter: Payer: Self-pay | Admitting: Internal Medicine

## 2018-01-01 NOTE — Telephone Encounter (Signed)
Informed pt. Pt verbalized understanding and will d/c Eliquis. Pt has appt to f/u with Kerin Ransom, [place] in March.

## 2018-01-02 DIAGNOSIS — M4120 Other idiopathic scoliosis, site unspecified: Secondary | ICD-10-CM | POA: Diagnosis not present

## 2018-01-05 ENCOUNTER — Other Ambulatory Visit: Payer: Self-pay | Admitting: Allergy

## 2018-01-16 DIAGNOSIS — J0191 Acute recurrent sinusitis, unspecified: Secondary | ICD-10-CM | POA: Diagnosis not present

## 2018-01-16 DIAGNOSIS — J4530 Mild persistent asthma, uncomplicated: Secondary | ICD-10-CM | POA: Diagnosis not present

## 2018-01-16 DIAGNOSIS — J300 Vasomotor rhinitis: Secondary | ICD-10-CM | POA: Diagnosis not present

## 2018-01-20 DIAGNOSIS — J0191 Acute recurrent sinusitis, unspecified: Secondary | ICD-10-CM | POA: Diagnosis not present

## 2018-02-02 NOTE — Patient Instructions (Addendum)
  Medications reviewed and updated.  Changes include taking the doxycycline for your sinus infection.  Use the nasal sprays.    Your prescription(s) have been submitted to your pharmacy. Please take as directed and contact our office if you believe you are having problem(s) with the medication(s).   Please followup in 3-4 months

## 2018-02-02 NOTE — Progress Notes (Signed)
Subjective:    Patient ID: Kara Ramirez, female    DOB: 03-03-1951, 67 y.o.   MRN: [id number]  HPI The patient is here for follow up.  She has chronic sinusitis and follows with an allergist.  She has constant congestion and drainage, but recently it has gotten worse and she thinks she has a sinus infection.  Cold symptoms: Her nasal congestion has been worse for the past 2-1/2 weeks.  She states low-grade fevers, ear pain, postnasal drip, sinus pain and pressure, sore throat, hoarseness, cough, shortness of breath and chest discomfort.  She has had headaches, lightheadedness and dizziness.  She will cough up some mucus at times, but does feel it is from the drainage.  Chest pain/tightness: She continues to have a chronic chest pain pain or tightness feeling.  She has seen cardiology and within the past year had a stress test, which was negative.  She feels like at times she cannot get enough air.  It is worse with the cold air.  It sometimes is worse with activity but not consistently.  Pro Air helps sometimes.  Her allergist plans on doing a test soon to evaluate if she has heartburn.  Prediabetes:  She is compliant with a low sugar/carbohydrate diet.  She is exercising some - walking in stores, plays with granddaughter.  Hyperlipidemia: She is taking her medication daily. She thinks she is having side effects from the lipitor - drowsy, dizzy, tired, back pain, muscle pain, nausea, memory changes, weakness, laryngitis.  She is compliant with a low fat/cholesterol diet. She is exercising some.     Medications and allergies reviewed with patient and updated if appropriate.  Patient Active Problem List   Diagnosis Date Noted  . Osteopenia 12/31/2017  . Lung nodule 12/10/2017  . Paroxysmal atrial fibrillation ([place]) 10/30/2017  . Shortness of breath 08/30/2017  . Chronic sinusitis 03/14/2017  . Nasal obstruction 03/14/2017  . Severe scoliosis 12/11/2016  . Prediabetes 06/06/2016  .  Cough 06/06/2016  . Allergic rhinitis 02/08/2016  . Osteoporosis 12/05/2015  . Chest pain 05/27/2015  . Hyperlipidemia 05/27/2015  . 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 Internal hemorrhoids 08/23/2011  . Constipation 08/23/2011    Current Outpatient Medications on File Prior to Visit  Medication Sig Dispense Refill  . albuterol (PROAIR HFA) 108 (90 Base) MCG/ACT inhaler USE 2 PUFFS EVERY 4 HOURS AS NEEDED FOR COUGH OR WHEEZE. MAY USE 2 PUFFS 10-20 MINUTES PRIOR TO EXERCISE 8.5 each 0  . apixaban (ELIQUIS) 5 MG TABS tablet Take 1 tablet (5 mg total) by mouth 2 (two) times daily. 60 tablet 6  . atorvastatin (LIPITOR) 20 MG tablet TAKE 1 TABLET BY MOUTH, ONCE DAILY. 90 tablet 3  . Calcium Carbonate-Vitamin D (CALCIUM 600+D) 600-400 MG-UNIT per tablet Take 1 tablet by mouth 2 (two) times daily.      . cholecalciferol (VITAMIN D) 1000 UNITS tablet Take 1,000 Units by mouth daily.      . Coenzyme Q10 (CO Q 10) 60 MG CAPS Take 1 tablet by mouth 2 (two) times daily.      . fluocinonide ointment (LIDEX) 0.05 % Apply 6.96 application topically 2 (two) times daily.    . fluorometholone (FML) 0.1 % ophthalmic ointment Place 1 application into both eyes 3 (three) times daily.    . fluticasone (FLONASE) 50 MCG/ACT nasal spray USE ONE SPRAY IN EACH NOSTRIL MIDDAY FOR CONGESTION. 16 g 5  . ketotifen (ZADITOR) 0.025 % ophthalmic solution 1 drop 2 (two)  times daily.    [name] loratadine (CLARITIN) 10 MG tablet Take 10 mg by mouth daily.      [name] MAGNESIUM SULFATE PO Take 1 tablet by mouth daily.      [name] MANGANESE PO Take 1 tablet by mouth daily.      . metoprolol tartrate (LOPRESSOR) 50 MG tablet Take ONE TABLET by mouth ONE HOUR prior to test. 1 tablet 0  . montelukast (SINGULAIR) 5 MG chewable tablet CHEW TWO TABLETS BY MOUTH DAILY TO PREVENT COUGH OR WHEEZE 60 tablet 4  . naproxen sodium (ALEVE) 220 MG tablet Take 220 mg by mouth daily.     . Omega-3 Fatty Acids (FISH OIL) 1000 MG CAPS Take 1 capsule by mouth  daily.    . polyethylene glycol powder (GLYCOLAX/MIRALAX) powder Take 9 grams (1/2 scoop) dissolved in at least 8 ounces of water/juice three times per week. 527 g 2  . tacrolimus (PROTOPIC) 0.1 % ointment Apply 1 application topically 2 (two) times daily.    . Thiamine HCl (VITAMIN B-1) 100 MG tablet Take 100 mg by mouth daily.      [name] Ubiquinol 100 MG CAPS Take 1 capsule by mouth daily.    . vitamin E 400 UNIT capsule Take 400 Units by mouth at bedtime.      . nitroGLYCERIN (NITROSTAT) 0.4 MG SL tablet Place 1 tablet (0.4 mg total) under the tongue every 5 (five) minutes as needed for chest pain. 25 tablet 3   No current facility-administered medications on file prior to visit.     Past Medical History:  Diagnosis Date  . Anemia   . Arthritis   . Asthma   . Chest pain   . Chronic headaches   . Cystocele   . Diverticulosis   . Endometriosis   . Fibromyalgia   . Hyperlipidemia   . IBS (irritable bowel syndrome)   . Irritable bowel syndrome with constipation   . Nephrolithiasis   . Ovarian cyst   . PAF (paroxysmal atrial fibrillation) ([place])   . Seasonal allergies     Past Surgical History:  Procedure Laterality Date  . BLADDER SUSPENSION    . CATARACT EXTRACTION Bilateral   . celiac artery anuerysym  2011  . kindey stone removal    . KNEE SURGERY Right    right x2  . LUMBAR DISC SURGERY  03/13/2011   T12-L7 PINS AND SCREWS  . TOTAL ABDOMINAL HYSTERECTOMY    . VAGINAL PROLAPSE REPAIR      Social History   Socioeconomic History  . Marital status: Married    Spouse name: None  . Number of children: 2  . Years of education: None  . Highest education level: None  Social Needs  . Financial resource strain: Not hard at all  . Food insecurity - worry: Never true  . Food insecurity - inability: Never true  . Transportation needs - medical: No  . Transportation needs - non-medical: No  Occupational History  . Occupation: retired    Fish farm manager: [employer]    Tobacco Use  . Smoking status: Never Smoker  . Smokeless tobacco: Never Used  Substance and Sexual Activity  . Alcohol use: No    Alcohol/week: 0.0 oz  . Drug use: No  . Sexual activity: No  Other Topics Concern  . None  Social History Narrative  . None    Family History  Problem Relation Age of Onset  . Colon cancer Mother   . Anemia Mother  Aplastic anemia-[place]  . Asthma Mother   . Heart disease Father   . Arthritis Father   . Nephrolithiasis Father   . Heart disease Maternal Grandfather   . Heart disease Paternal Grandfather   . Allergic rhinitis Neg Hx   . Angioedema Neg Hx   . Eczema Neg Hx   . Immunodeficiency Neg Hx   . Urticaria Neg Hx     Review of Systems  Constitutional: Positive for fever (low grade).  HENT: Positive for congestion, ear pain, postnasal drip, sinus pressure, sinus pain, sore throat and voice change.   Respiratory: Positive for cough (PND - occ brings mucus up) and shortness of breath. Negative for wheezing.   Cardiovascular: Positive for chest pain. Negative for palpitations and leg swelling.  Musculoskeletal: Positive for arthralgias and myalgias.  Neurological: Positive for dizziness, light-headedness and headaches.       Objective:   Vitals:   02/04/18 1109  BP: (!) 146/82  Pulse: (!) 101  Resp: 16  Temp: 97.7 F (36.5 C)  SpO2: 98%   BP Readings from Last 3 Encounters:  02/04/18 (!) 146/82  12/30/17 136/72  12/11/17 116/80   Wt Readings from Last 3 Encounters:  02/04/18 160 lb (72.6 kg)  12/30/17 154 lb (69.9 kg)  12/11/17 151 lb (68.5 kg)   Body mass index is 25.06 kg/m.   Physical Exam    GENERAL APPEARANCE: Appears stated age, well appearing, NAD EYES: conjunctiva clear, no icterus HEENT: bilateral tympanic membranes and ear canals normal, oropharynx with mild erythema, nasal congestion and sinus pressure with palpation, no thyromegaly, trachea midline, no cervical or supraclavicular  lymphadenopathy LUNGS: Clear to auscultation without wheeze or crackles, unlabored breathing, good air entry bilaterally CARDIOVASCULAR: Normal S1,S2 without murmurs, no edema SKIN: Warm, dry      Assessment & Plan:    See Problem List for Assessment and Plan of chronic medical problems.

## 2018-02-04 ENCOUNTER — Ambulatory Visit (INDEPENDENT_AMBULATORY_CARE_PROVIDER_SITE_OTHER): Payer: PPO | Admitting: Internal Medicine

## 2018-02-04 ENCOUNTER — Encounter: Payer: Self-pay | Admitting: Internal Medicine

## 2018-02-04 VITALS — BP 146/82 | HR 101 | Temp 97.7°F | Resp 16 | Wt 160.0 lb

## 2018-02-04 DIAGNOSIS — R7303 Prediabetes: Secondary | ICD-10-CM

## 2018-02-04 DIAGNOSIS — E7849 Other hyperlipidemia: Secondary | ICD-10-CM | POA: Diagnosis not present

## 2018-02-04 DIAGNOSIS — J320 Chronic maxillary sinusitis: Secondary | ICD-10-CM | POA: Diagnosis not present

## 2018-02-04 DIAGNOSIS — R0789 Other chest pain: Secondary | ICD-10-CM

## 2018-02-04 DIAGNOSIS — J329 Chronic sinusitis, unspecified: Secondary | ICD-10-CM | POA: Insufficient documentation

## 2018-02-04 MED ORDER — DOXYCYCLINE HYCLATE 100 MG PO TABS: 100.0000 mg | ORAL_TABLET | Freq: Two times a day (BID) | ORAL | 0 refills | Status: DC

## 2018-02-04 NOTE — Assessment & Plan Note (Signed)
Taking Lipitor 20 mg daily-concerned she is having side effects from Lipitor including: Drowsiness, dizziness, fatigue, back pain, muscle pain, nausea, weakness, laryngitis and memory changes We will stop Lipitor and see what improves We will recheck lipid panel at her next visit and discuss other options Stressed the importance of her healthy diet and regular exercise

## 2018-02-04 NOTE — Assessment & Plan Note (Addendum)
Likely bacterial  Start doxycycline Use nasal sprays otc cold medications Rest, fluid We will follow-up with her allergist-she does have chronic sinusitis and some of the symptoms are chronic, but have been much worse

## 2018-02-04 NOTE — Assessment & Plan Note (Signed)
Last A1c in prediabetic range Stressed increasing exercise Stressed healthy diet We will recheck again at her next visit

## 2018-02-06 DIAGNOSIS — K219 Gastro-esophageal reflux disease without esophagitis: Secondary | ICD-10-CM | POA: Diagnosis not present

## 2018-02-14 ENCOUNTER — Ambulatory Visit: Payer: PPO | Admitting: Cardiology

## 2018-02-17 ENCOUNTER — Ambulatory Visit: Payer: PPO | Admitting: Cardiology

## 2018-02-17 ENCOUNTER — Encounter: Payer: Self-pay | Admitting: Cardiology

## 2018-02-17 VITALS — BP 112/64 | HR 84 | Ht 67.0 in | Wt 157.0 lb

## 2018-02-17 DIAGNOSIS — J309 Allergic rhinitis, unspecified: Secondary | ICD-10-CM | POA: Diagnosis not present

## 2018-02-17 DIAGNOSIS — R079 Chest pain, unspecified: Secondary | ICD-10-CM

## 2018-02-17 DIAGNOSIS — E7849 Other hyperlipidemia: Secondary | ICD-10-CM | POA: Diagnosis not present

## 2018-02-17 DIAGNOSIS — I480 Paroxysmal atrial fibrillation: Secondary | ICD-10-CM | POA: Diagnosis not present

## 2018-02-17 DIAGNOSIS — R0789 Other chest pain: Secondary | ICD-10-CM

## 2018-02-17 MED ORDER — METOPROLOL SUCCINATE ER 25 MG PO TB24: 25.0000 mg | ORAL_TABLET | Freq: Every day | ORAL | 6 refills | Status: DC

## 2018-02-17 NOTE — Assessment & Plan Note (Signed)
Was taking Lipitor 20 mg daily-stopped 02/04/18 because she was afraid she was having side effects from Lipitor including: Drowsiness, dizziness, fatigue, back pain, muscle pain, nausea, weakness, laryngitis and memory changes

## 2018-02-17 NOTE — Assessment & Plan Note (Signed)
Seen in [facility] clinic- she did not have PAF, pVCs, PSVT. No need for anticoagulation Beta blocker added today

## 2018-02-17 NOTE — Assessment & Plan Note (Signed)
Chronic cough

## 2018-02-17 NOTE — Assessment & Plan Note (Signed)
Normal echo and Nuclear stress 2016 negative Dobutamine echo Sept 2018 Coronary CTA Jan 2019- < 30% LM, LAD', CFX

## 2018-02-17 NOTE — Patient Instructions (Signed)
Medication Instructions: Your physician recommends that you continue on your current medications as directed. Please refer to the Current Medication list given to you today.  START Metoprolol Succinate 25 mg daily.   Follow-Up: Your physician wants you to follow-up in: 6 months with Dr. Gwenlyn Found. You will receive a reminder letter in the mail two months in advance. If you don't receive a letter, please call our office to schedule the follow-up appointment.  If you need a refill on your cardiac medications before your next appointment, please call your pharmacy.

## 2018-02-17 NOTE — Progress Notes (Signed)
02/17/2018 Kara Ramirez   Aug 11, 1951  [id number]  Primary Physician Binnie Rail, MD Primary Cardiologist: Dr Gwenlyn Found  HPI:  67 y/o female followed by Dr Gwenlyn Found with a history of chest pain and chronic allergies. She had an echo and Myoview in 2016 that were normal. She was hospitalized in [place] with chest pain and cough in Sept 2018. She had a negative dobutamine echo then. Dr Gwenlyn Found ordered a coronary CTA which was done 12/10/17 and showed only minor CAD, < 30% LM, LAD, and CFX. Since then she has had vague compliant's of muscle pain in her shoulders and "no energy". She was taken off her statin to see if this may have been related. She did relate the story of how her father died on the way out the door to go to the hospital. She tells me they could never tell her what happened "and I don't want that to happen to me". I think she feels like she has symptoms fatigue, chest discomfort, muscle discomfort, but no one has been able to give her a diagnosis.    Current Outpatient Medications  Medication Sig Dispense Refill  . albuterol (PROAIR HFA) 108 (90 Base) MCG/ACT inhaler USE 2 PUFFS EVERY 4 HOURS AS NEEDED FOR COUGH OR WHEEZE. MAY USE 2 PUFFS 10-20 MINUTES PRIOR TO EXERCISE 8.5 each 0  . Calcium Carbonate-Vitamin D (CALCIUM 600+D) 600-400 MG-UNIT per tablet Take 1 tablet by mouth 2 (two) times daily.      . cholecalciferol (VITAMIN D) 1000 UNITS tablet Take 1,000 Units by mouth daily.      . Coenzyme Q10 (CO Q 10) 60 MG CAPS Take 1 tablet by mouth 2 (two) times daily.      . fluocinonide ointment (LIDEX) 0.05 % Apply 5.17 application topically 2 (two) times daily.    . fluorometholone (FML) 0.1 % ophthalmic ointment Place 1 application into both eyes 3 (three) times daily.    . fluticasone (FLONASE) 50 MCG/ACT nasal spray USE ONE SPRAY IN EACH NOSTRIL MIDDAY FOR CONGESTION. 16 g 5  . ipratropium (ATROVENT) 0.03 % nasal spray 1 SPRAY IN EACH NOSTRIL EVERY 6 HOURS AS NEEDED FOR RUNNY NOSE.  2   . ketotifen (ZADITOR) 0.025 % ophthalmic solution 1 drop 2 (two) times daily.    [name] loratadine (CLARITIN) 10 MG tablet Take 10 mg by mouth daily.      [name] MAGNESIUM SULFATE PO Take 1 tablet by mouth daily.      [name] MANGANESE PO Take 1 tablet by mouth daily.      . naproxen sodium (ALEVE) 220 MG tablet Take 220 mg by mouth daily.     . Omega-3 Fatty Acids (FISH OIL) 1000 MG CAPS Take 1 capsule by mouth daily.    . polyethylene glycol powder (GLYCOLAX/MIRALAX) powder Take 9 grams (1/2 scoop) dissolved in at least 8 ounces of water/juice three times per week. 527 g 2  . tacrolimus (PROTOPIC) 0.1 % ointment Apply 1 application topically 2 (two) times daily.    . Thiamine HCl (VITAMIN B-1) 100 MG tablet Take 100 mg by mouth daily.      [name] Ubiquinol 100 MG CAPS Take 1 capsule by mouth daily.    . vitamin E 400 UNIT capsule Take 400 Units by mouth at bedtime.      . metoprolol succinate (TOPROL XL) 25 MG 24 hr tablet Take 1 tablet (25 mg total) by mouth daily. 30 tablet 6  . metoprolol tartrate (LOPRESSOR) 50 MG  tablet Take ONE TABLET by mouth ONE HOUR prior to test. (Patient not taking: Reported on 02/17/2018) 1 tablet 0   No current facility-administered medications for this visit.     Allergies  Allergen Reactions  . Wheat Bran Anaphylaxis  . Morphine Other (See Comments)    REACTION: tachycardia and anxiety  . Peanut Oil Nausea And Vomiting  . Penicillin G Hives  . Protonix [Pantoprazole Sodium] Nausea And Vomiting  . Mold Extract [Trichophyton Mentagrophyte]   . Molds & Smuts   . Wheat   . Cetirizine Rash    Around face  . Codeine Other (See Comments)    REACTION: dizzy and "groggy in my head"  . Eggs Or Egg-Derived Products Nausea And Vomiting  . Penicillins Rash    Eyes puffy Has taken low dose pcn and no rx REACTION: rash, SOB  . Pentazocine Lactate Palpitations and Other (See Comments)    Past Medical History:  Diagnosis Date  . Anemia   . Arthritis   . Asthma   . Chest pain    . Chronic headaches   . Cystocele   . Diverticulosis   . Endometriosis   . Fibromyalgia   . Hyperlipidemia   . IBS (irritable bowel syndrome)   . Irritable bowel syndrome with constipation   . Nephrolithiasis   . Ovarian cyst   . PAF (paroxysmal atrial fibrillation) ([place])   . Seasonal allergies     Social History   Socioeconomic History  . Marital status: Married    Spouse name: Not on file  . Number of children: 2  . Years of education: Not on file  . Highest education level: Not on file  Occupational History  . Occupation: retired    Fish farm manager: [employer]  Social Needs  . Financial resource strain: Not hard at all  . Food insecurity:    Worry: Never true    Inability: Never true  . Transportation needs:    Medical: No    Non-medical: No  Tobacco Use  . Smoking status: Never Smoker  . Smokeless tobacco: Never Used  Substance and Sexual Activity  . Alcohol use: No    Alcohol/week: 0.0 oz  . Drug use: No  . Sexual activity: Never  Lifestyle  . Physical activity:    Days per week: 4 days    Minutes per session: 40 min  . Stress: To some extent  Relationships  . Social connections:    Talks on phone: More than three times a week    Gets together: More than three times a week    Attends religious service: Not on file    Active member of club or organization: Not on file    Attends meetings of clubs or organizations: Not on file    Relationship status: Married  . Intimate partner violence:    Fear of current or ex partner: No    Emotionally abused: No    Physically abused: No    Forced sexual activity: No  Other Topics Concern  . Not on file  Social History Narrative  . Not on file     Family History  Problem Relation Age of Onset  . Colon cancer Mother   . Anemia Mother        Aplastic anemia-[place]ra  . Asthma Mother   . Heart disease Father   . Arthritis Father   . Nephrolithiasis Father   . Heart disease Maternal Grandfather   .  Heart disease Paternal Grandfather   .  Allergic rhinitis Neg Hx   . Angioedema Neg Hx   . Eczema Neg Hx   . Immunodeficiency Neg Hx   . Urticaria Neg Hx      Review of Systems: General: negative for chills, fever, night sweats or weight changes.  Cardiovascular: negative for chest pain, dyspnea on exertion, edema, orthopnea, palpitations, paroxysmal nocturnal dyspnea or shortness of breath Dermatological: negative for rash Respiratory: negative for cough or wheezing Urologic: negative for hematuria Abdominal: negative for nausea, vomiting, diarrhea, bright red blood per rectum, melena, or hematemesis Neurologic: negative for visual changes, syncope, or dizziness All other systems reviewed and are otherwise negative except as noted above.    Blood pressure 112/64, pulse 84, height 5\' 7"  (1.702 m), weight 157 lb (71.2 kg), SpO2 98 %.  General appearance: alert, cooperative, no distress, pale and thin Neck: no carotid bruit and no JVD Lungs: scattered rhonchi Heart: regular rate and rhythm and frequent extra systole Extremities: no edema Skin: pale, cool, dry Neurologic: Grossly normal  EKG NSR, PVCs  ASSESSMENT AND PLAN:   Chest pain Normal echo and Nuclear stress 2016 negative Dobutamine echo Sept 2018 Coronary CTA Jan 2019- < 30% LM, LAD', CFX  Allergic rhinitis Chronic cough  Hyperlipidemia Was taking Lipitor 20 mg daily-stopped 02/04/18 because she was afraid she was having side effects from Lipitor including: Drowsiness, dizziness, fatigue, back pain, muscle pain, nausea, weakness, laryngitis and memory changes  Paroxysmal atrial fibrillation ([facility]) Seen in AF clinic- she did not have PAF, pVCs, PSVT. No need for anticoagulation Beta blocker added today   PLAN  Pt seen today for routine check up. I reassured her I though her heart was OK base on the past studies. I did suggest we start her on low dose beta blocker. I suggested her if her shoulder discomfort doesn't  improve off Lipitor she should resume it.  She  F/U Dr Gwenlyn Found in 6 months. She does have a f/u chest CT ordered by Dr Quay Burow in July (f/u incidental nodule on coronary CTA).   Kerin Ransom PA-C 02/17/2018 10:27 AM

## 2018-03-06 ENCOUNTER — Emergency Department (HOSPITAL_COMMUNITY): Payer: PPO

## 2018-03-06 ENCOUNTER — Encounter (HOSPITAL_COMMUNITY): Payer: Self-pay | Admitting: Emergency Medicine

## 2018-03-06 ENCOUNTER — Other Ambulatory Visit: Payer: Self-pay

## 2018-03-06 ENCOUNTER — Inpatient Hospital Stay (HOSPITAL_COMMUNITY)
Admission: EM | Admit: 2018-03-06 | Discharge: 2018-03-11 | DRG: 086 | Disposition: A | Discharge status: Rehabilitation Facility | Payer: PPO | Attending: General Surgery | Admitting: General Surgery

## 2018-03-06 DIAGNOSIS — Z791 Long term (current) use of non-steroidal anti-inflammatories (NSAID): Secondary | ICD-10-CM | POA: Diagnosis not present

## 2018-03-06 DIAGNOSIS — Z8249 Family history of ischemic heart disease and other diseases of the circulatory system: Secondary | ICD-10-CM

## 2018-03-06 DIAGNOSIS — I609 Nontraumatic subarachnoid hemorrhage, unspecified: Secondary | ICD-10-CM | POA: Diagnosis present

## 2018-03-06 DIAGNOSIS — J45909 Unspecified asthma, uncomplicated: Secondary | ICD-10-CM | POA: Diagnosis not present

## 2018-03-06 DIAGNOSIS — R402362 Coma scale, best motor response, obeys commands, at arrival to emergency department: Secondary | ICD-10-CM | POA: Diagnosis not present

## 2018-03-06 DIAGNOSIS — Z880 Allergy status to penicillin: Secondary | ICD-10-CM | POA: Diagnosis not present

## 2018-03-06 DIAGNOSIS — J9811 Atelectasis: Secondary | ICD-10-CM | POA: Diagnosis present

## 2018-03-06 DIAGNOSIS — S066X9S Traumatic subarachnoid hemorrhage with loss of consciousness of unspecified duration, sequela: Secondary | ICD-10-CM | POA: Diagnosis not present

## 2018-03-06 DIAGNOSIS — Z8261 Family history of arthritis: Secondary | ICD-10-CM | POA: Diagnosis not present

## 2018-03-06 DIAGNOSIS — D62 Acute posthemorrhagic anemia: Secondary | ICD-10-CM | POA: Diagnosis not present

## 2018-03-06 DIAGNOSIS — I480 Paroxysmal atrial fibrillation: Secondary | ICD-10-CM | POA: Diagnosis not present

## 2018-03-06 DIAGNOSIS — S301XXA Contusion of abdominal wall, initial encounter: Secondary | ICD-10-CM | POA: Diagnosis present

## 2018-03-06 DIAGNOSIS — R402252 Coma scale, best verbal response, oriented, at arrival to emergency department: Secondary | ICD-10-CM | POA: Diagnosis not present

## 2018-03-06 DIAGNOSIS — Z7951 Long term (current) use of inhaled steroids: Secondary | ICD-10-CM

## 2018-03-06 DIAGNOSIS — R0789 Other chest pain: Secondary | ICD-10-CM | POA: Diagnosis not present

## 2018-03-06 DIAGNOSIS — Z7901 Long term (current) use of anticoagulants: Secondary | ICD-10-CM | POA: Diagnosis not present

## 2018-03-06 DIAGNOSIS — R1084 Generalized abdominal pain: Secondary | ICD-10-CM | POA: Diagnosis not present

## 2018-03-06 DIAGNOSIS — Z800 Family history of malignant neoplasm of digestive organs: Secondary | ICD-10-CM | POA: Diagnosis not present

## 2018-03-06 DIAGNOSIS — H532 Diplopia: Secondary | ICD-10-CM | POA: Diagnosis not present

## 2018-03-06 DIAGNOSIS — E876 Hypokalemia: Secondary | ICD-10-CM | POA: Diagnosis not present

## 2018-03-06 DIAGNOSIS — W2212XA Striking against or struck by front passenger side automobile airbag, initial encounter: Secondary | ICD-10-CM

## 2018-03-06 DIAGNOSIS — Z825 Family history of asthma and other chronic lower respiratory diseases: Secondary | ICD-10-CM | POA: Diagnosis not present

## 2018-03-06 DIAGNOSIS — R402142 Coma scale, eyes open, spontaneous, at arrival to emergency department: Secondary | ICD-10-CM | POA: Diagnosis present

## 2018-03-06 DIAGNOSIS — Z9842 Cataract extraction status, left eye: Secondary | ICD-10-CM | POA: Diagnosis not present

## 2018-03-06 DIAGNOSIS — M1990 Unspecified osteoarthritis, unspecified site: Secondary | ICD-10-CM | POA: Diagnosis not present

## 2018-03-06 DIAGNOSIS — T1490XA Injury, unspecified, initial encounter: Secondary | ICD-10-CM | POA: Diagnosis not present

## 2018-03-06 DIAGNOSIS — S299XXA Unspecified injury of thorax, initial encounter: Secondary | ICD-10-CM | POA: Diagnosis not present

## 2018-03-06 DIAGNOSIS — Z888 Allergy status to other drugs, medicaments and biological substances status: Secondary | ICD-10-CM | POA: Diagnosis not present

## 2018-03-06 DIAGNOSIS — Y92410 Unspecified street and highway as the place of occurrence of the external cause: Secondary | ICD-10-CM

## 2018-03-06 DIAGNOSIS — Z981 Arthrodesis status: Secondary | ICD-10-CM | POA: Diagnosis not present

## 2018-03-06 DIAGNOSIS — J302 Other seasonal allergic rhinitis: Secondary | ICD-10-CM | POA: Diagnosis not present

## 2018-03-06 DIAGNOSIS — E785 Hyperlipidemia, unspecified: Secondary | ICD-10-CM | POA: Diagnosis not present

## 2018-03-06 DIAGNOSIS — Z91018 Allergy to other foods: Secondary | ICD-10-CM

## 2018-03-06 DIAGNOSIS — Z9841 Cataract extraction status, right eye: Secondary | ICD-10-CM | POA: Diagnosis not present

## 2018-03-06 DIAGNOSIS — S066X0A Traumatic subarachnoid hemorrhage without loss of consciousness, initial encounter: Secondary | ICD-10-CM | POA: Diagnosis not present

## 2018-03-06 DIAGNOSIS — K581 Irritable bowel syndrome with constipation: Secondary | ICD-10-CM | POA: Diagnosis present

## 2018-03-06 DIAGNOSIS — M797 Fibromyalgia: Secondary | ICD-10-CM | POA: Diagnosis not present

## 2018-03-06 DIAGNOSIS — Z91012 Allergy to eggs: Secondary | ICD-10-CM | POA: Diagnosis not present

## 2018-03-06 DIAGNOSIS — Z885 Allergy status to narcotic agent status: Secondary | ICD-10-CM

## 2018-03-06 DIAGNOSIS — R079 Chest pain, unspecified: Secondary | ICD-10-CM | POA: Diagnosis not present

## 2018-03-06 DIAGNOSIS — S069X3S Unspecified intracranial injury with loss of consciousness of 1 hour to 5 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, sequela: Secondary | ICD-10-CM | POA: Diagnosis not present

## 2018-03-06 DIAGNOSIS — Z90710 Acquired absence of both cervix and uterus: Secondary | ICD-10-CM | POA: Diagnosis not present

## 2018-03-06 DIAGNOSIS — S069X1S Unspecified intracranial injury with loss of consciousness of 30 minutes or less, sequela: Secondary | ICD-10-CM | POA: Diagnosis not present

## 2018-03-06 DIAGNOSIS — Z87442 Personal history of urinary calculi: Secondary | ICD-10-CM | POA: Diagnosis not present

## 2018-03-06 DIAGNOSIS — S199XXA Unspecified injury of neck, initial encounter: Secondary | ICD-10-CM | POA: Diagnosis not present

## 2018-03-06 DIAGNOSIS — S3991XA Unspecified injury of abdomen, initial encounter: Secondary | ICD-10-CM | POA: Diagnosis not present

## 2018-03-06 DIAGNOSIS — S06899A Other specified intracranial injury with loss of consciousness of unspecified duration, initial encounter: Secondary | ICD-10-CM | POA: Diagnosis not present

## 2018-03-06 HISTORY — DX: Unspecified injury of right lower leg, initial encounter: S89.91XA

## 2018-03-06 LAB — CBC WITH DIFFERENTIAL/PLATELET
Basophils Absolute: 0 10*3/uL (ref 0.0–0.1)
Basophils Relative: 0 %
Eosinophils Absolute: 0.1 10*3/uL (ref 0.0–0.7)
Eosinophils Relative: 1 %
HCT: 40.1 % (ref 36.0–46.0)
Hemoglobin: 12.7 g/dL (ref 12.0–15.0)
Lymphocytes Relative: 9 %
Lymphs Abs: 1.1 10*3/uL (ref 0.7–4.0)
MCH: 27.2 pg (ref 26.0–34.0)
MCHC: 31.7 g/dL (ref 30.0–36.0)
MCV: 85.9 fL (ref 78.0–100.0)
Monocytes Absolute: 0.5 10*3/uL (ref 0.1–1.0)
Monocytes Relative: 4 %
Neutro Abs: 10.5 10*3/uL — ABNORMAL HIGH (ref 1.7–7.7)
Neutrophils Relative %: 86 %
Platelets: 210 10*3/uL (ref 150–400)
RBC: 4.67 MIL/uL (ref 3.87–5.11)
RDW: 13.7 % (ref 11.5–15.5)
WBC: 12.2 10*3/uL — ABNORMAL HIGH (ref 4.0–10.5)

## 2018-03-06 LAB — COMPREHENSIVE METABOLIC PANEL
ALT: 36 U/L (ref 14–54)
AST: 39 U/L (ref 15–41)
Albumin: 4.1 g/dL (ref 3.5–5.0)
Alkaline Phosphatase: 58 U/L (ref 38–126)
Anion gap: 8 (ref 5–15)
BUN: 16 mg/dL (ref 6–20)
CO2: 25 mmol/L (ref 22–32)
Calcium: 9.5 mg/dL (ref 8.9–10.3)
Chloride: 107 mmol/L (ref 101–111)
Creatinine, Ser: 0.99 mg/dL (ref 0.44–1.00)
GFR calc Af Amer: >60 mL/min (ref >60)
GFR calc non Af Amer: 58 mL/min — ABNORMAL LOW (ref >60)
Glucose, Bld: 120 mg/dL — ABNORMAL HIGH (ref 65–99)
Potassium: 4.1 mmol/L (ref 3.5–5.1)
Sodium: 140 mmol/L (ref 135–145)
Total Bilirubin: 0.8 mg/dL (ref 0.3–1.2)
Total Protein: 6.8 g/dL (ref 6.5–8.1)

## 2018-03-06 LAB — PROTIME-INR
INR: 1.08
Prothrombin Time: 13.9 seconds (ref 11.4–15.2)

## 2018-03-06 MED ORDER — HYDROMORPHONE HCL 2 MG/ML IJ SOLN: 0.3000 mg | Freq: Once | INTRAMUSCULAR | Status: DC

## 2018-03-06 MED ORDER — FLUTICASONE PROPIONATE 50 MCG/ACT NA SUSP
2.0000 | Freq: Every day | NASAL | Status: DC
  Administered 2018-03-07 – 2018-03-11 (×4): 2 via NASAL
  Filled 2018-03-06 (×2): qty 16

## 2018-03-06 MED ORDER — ONDANSETRON HCL 4 MG/2ML IJ SOLN: 4.0000 mg | Freq: Once | INTRAMUSCULAR | Status: DC

## 2018-03-06 MED ORDER — IPRATROPIUM BROMIDE 0.06 % NA SOLN
2.0000 | Freq: Four times a day (QID) | NASAL | Status: DC | PRN
Start: 2018-03-06 — End: 2018-03-11
  Filled 2018-03-06: qty 15

## 2018-03-06 MED ORDER — IOPAMIDOL (ISOVUE-300) INJECTION 61%
100.0000 mL | Freq: Once | INTRAVENOUS | Status: AC | PRN
  Administered 2018-03-06: 75 mL via INTRAVENOUS

## 2018-03-06 MED ORDER — ONDANSETRON 4 MG PO TBDP
4.0000 mg | ORAL_TABLET | Freq: Four times a day (QID) | ORAL | Status: DC | PRN
  Filled 2018-03-06: qty 1

## 2018-03-06 MED ORDER — IOPAMIDOL (ISOVUE-300) INJECTION 61%
INTRAVENOUS | Status: AC
  Filled 2018-03-06: qty 100

## 2018-03-06 MED ORDER — TACROLIMUS 0.1 % EX OINT
1.0000 "application " | TOPICAL_OINTMENT | Freq: Two times a day (BID) | CUTANEOUS | Status: DC | PRN
  Filled 2018-03-06: qty 1

## 2018-03-06 MED ORDER — ONDANSETRON HCL 4 MG/2ML IJ SOLN: 4.0000 mg | Freq: Four times a day (QID) | INTRAMUSCULAR | Status: DC | PRN

## 2018-03-06 MED ORDER — METHOCARBAMOL 500 MG PO TABS
500.0000 mg | ORAL_TABLET | Freq: Three times a day (TID) | ORAL | Status: DC | PRN
  Administered 2018-03-07 – 2018-03-08 (×3): 500 mg via ORAL
  Filled 2018-03-06 (×4): qty 1

## 2018-03-06 MED ORDER — HYDRALAZINE HCL 20 MG/ML IJ SOLN: 10.0000 mg | INTRAMUSCULAR | Status: DC | PRN

## 2018-03-06 MED ORDER — LORATADINE 10 MG PO TABS
10.0000 mg | ORAL_TABLET | Freq: Every day | ORAL | Status: DC
  Administered 2018-03-07 – 2018-03-11 (×5): 10 mg via ORAL
  Filled 2018-03-06 (×5): qty 1

## 2018-03-06 MED ORDER — ACETAMINOPHEN 325 MG PO TABS
650.0000 mg | ORAL_TABLET | ORAL | Status: DC | PRN
  Administered 2018-03-06 – 2018-03-10 (×13): 650 mg via ORAL
  Filled 2018-03-06 (×13): qty 2

## 2018-03-06 MED ORDER — TETANUS-DIPHTH-ACELL PERTUSSIS 5-2.5-18.5 LF-MCG/0.5 IM SUSP: 0.5000 mL | Freq: Once | INTRAMUSCULAR | Status: DC

## 2018-03-06 MED ORDER — BECLOMETHASONE DIPROPIONATE 40 MCG/ACT IN AERS: 2.0000 | INHALATION_SPRAY | Freq: Two times a day (BID) | RESPIRATORY_TRACT | Status: DC

## 2018-03-06 MED ORDER — METOPROLOL SUCCINATE ER 25 MG PO TB24
25.0000 mg | ORAL_TABLET | Freq: Every day | ORAL | Status: DC
  Administered 2018-03-06 – 2018-03-11 (×6): 25 mg via ORAL
  Filled 2018-03-06 (×6): qty 1

## 2018-03-06 MED ORDER — IPRATROPIUM BROMIDE 0.03 % NA SOLN
1.0000 | Freq: Four times a day (QID) | NASAL | Status: DC | PRN
  Filled 2018-03-06: qty 30

## 2018-03-06 MED ORDER — KETOTIFEN FUMARATE 0.025 % OP SOLN
1.0000 [drp] | Freq: Two times a day (BID) | OPHTHALMIC | Status: DC
  Administered 2018-03-06 – 2018-03-11 (×10): 1 [drp] via OPHTHALMIC
  Filled 2018-03-06 (×3): qty 5

## 2018-03-06 MED ORDER — HYDROMORPHONE HCL 1 MG/ML IJ SOLN: 0.5000 mg | INTRAMUSCULAR | Status: DC | PRN

## 2018-03-06 MED ORDER — BUDESONIDE 0.25 MG/2ML IN SUSP
0.2500 mg | Freq: Two times a day (BID) | RESPIRATORY_TRACT | Status: DC
  Administered 2018-03-06 – 2018-03-11 (×10): 0.25 mg via RESPIRATORY_TRACT
  Filled 2018-03-06 (×10): qty 2

## 2018-03-06 MED ORDER — SODIUM CHLORIDE 0.9 % IV SOLN
INTRAVENOUS | Status: DC
  Administered 2018-03-06 – 2018-03-07 (×2): via INTRAVENOUS

## 2018-03-06 MED ORDER — PROMETHAZINE HCL 25 MG/ML IJ SOLN
12.5000 mg | Freq: Four times a day (QID) | INTRAMUSCULAR | Status: DC | PRN
  Administered 2018-03-07 (×2): 12.5 mg via INTRAVENOUS
  Filled 2018-03-06 (×2): qty 1

## 2018-03-06 MED ORDER — DOCUSATE SODIUM 100 MG PO CAPS
100.0000 mg | ORAL_CAPSULE | Freq: Two times a day (BID) | ORAL | Status: DC
  Administered 2018-03-06 – 2018-03-11 (×10): 100 mg via ORAL
  Filled 2018-03-06 (×10): qty 1

## 2018-03-06 MED ORDER — ALBUTEROL SULFATE (2.5 MG/3ML) 0.083% IN NEBU: 2.5000 mg | INHALATION_SOLUTION | RESPIRATORY_TRACT | Status: DC | PRN

## 2018-03-06 MED ORDER — HYDROMORPHONE HCL 2 MG/ML IJ SOLN: 0.5000 mg | INTRAMUSCULAR | Status: DC | PRN

## 2018-03-06 MED ORDER — POLYETHYLENE GLYCOL 3350 17 G PO PACK
17.0000 g | PACK | Freq: Every day | ORAL | Status: DC
  Administered 2018-03-07 – 2018-03-11 (×3): 17 g via ORAL
  Filled 2018-03-06 (×4): qty 1
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
## 2018-03-06 NOTE — ED Notes (Signed)
Got patient into a gown got patient vitals patient is resting with call bell in reach 

## 2018-03-06 NOTE — ED Notes (Signed)
ED Provider at bedside. 
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
## 2018-03-06 NOTE — ED Notes (Signed)
Brought pt husband back to room. Husband states pt is not usually confused. Pt alert to self and place. Disoriented to time and situation
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
## 2018-03-06 NOTE — ED Notes (Signed)
Admitting at bedside 

## 2018-03-06 NOTE — ED Notes (Signed)
Patient transported to CT 

## 2018-03-06 NOTE — ED Notes (Signed)
Spoke to Admitting providers regarding talking to the pt family

## 2018-03-06 NOTE — ED Notes (Signed)
[employer] at bedisde

## 2018-03-06 NOTE — Consult Note (Addendum)
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 subsequently.  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   No change in recommendaations  Adele Barthel, MD, [facility] [place] Office: [phone] Pager: [phone]  03/06/2018, 10:49 PM

## 2018-03-07 ENCOUNTER — Inpatient Hospital Stay (HOSPITAL_COMMUNITY): Payer: PPO

## 2018-03-07 ENCOUNTER — Encounter (HOSPITAL_COMMUNITY): Payer: Self-pay | Admitting: *Deleted

## 2018-03-07 DIAGNOSIS — S069X3S Unspecified intracranial injury with loss of consciousness of 1 hour to 5 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, sequela: Secondary | ICD-10-CM

## 2018-03-07 LAB — CBC
HCT: 34.9 % — ABNORMAL LOW (ref 36.0–46.0)
Hemoglobin: 11.1 g/dL — ABNORMAL LOW (ref 12.0–15.0)
MCH: 26.9 pg (ref 26.0–34.0)
MCHC: 31.8 g/dL (ref 30.0–36.0)
MCV: 84.5 fL (ref 78.0–100.0)
Platelets: 181 10*3/uL (ref 150–400)
RBC: 4.13 MIL/uL (ref 3.87–5.11)
RDW: 13.8 % (ref 11.5–15.5)
WBC: 7.8 10*3/uL (ref 4.0–10.5)

## 2018-03-07 LAB — BASIC METABOLIC PANEL
Anion gap: 8 (ref 5–15)
BUN: 12 mg/dL (ref 6–20)
CO2: 20 mmol/L — ABNORMAL LOW (ref 22–32)
Calcium: 8.9 mg/dL (ref 8.9–10.3)
Chloride: 110 mmol/L (ref 101–111)
Creatinine, Ser: 0.66 mg/dL (ref 0.44–1.00)
GFR calc Af Amer: >60 mL/min (ref >60)
GFR calc non Af Amer: >60 mL/min (ref >60)
Glucose, Bld: 111 mg/dL — ABNORMAL HIGH (ref 65–99)
Potassium: 3.6 mmol/L (ref 3.5–5.1)
Sodium: 138 mmol/L (ref 135–145)

## 2018-03-07 LAB — MAGNESIUM: Magnesium: 1.9 mg/dL (ref 1.7–2.4)

## 2018-03-07 MED ORDER — IOPAMIDOL (ISOVUE-370) INJECTION 76%
100.0000 mL | Freq: Once | INTRAVENOUS | Status: AC | PRN
  Administered 2018-03-07: 100 mL via INTRAVENOUS

## 2018-03-07 MED ORDER — IOPAMIDOL (ISOVUE-370) INJECTION 76%
INTRAVENOUS | Status: AC
  Filled 2018-03-07: qty 100

## 2018-03-07 NOTE — Progress Notes (Signed)
Rehab Admissions Coordinator Note:  Patient was screened by Cleatrice Burke for appropriateness for an Inpatient Acute Rehab Consult per PT recommendation. At this time, we are recommending Inpatient Rehab consult.  Cleatrice Burke 03/07/2018, 1:07 PM  I can be reached at [phone].

## 2018-03-07 NOTE — NC FL2 (Addendum)
[place] LEVEL OF CARE SCREENING TOOL     IDENTIFICATION  Patient Name: Kara Ramirez Birthdate: [birth year] Sex: female Admission Date (Current Location): 03/06/2018  [facility] and [place] Number:  Herbalist and Address:  [facility] [place], [place] [address], [place], [place] [address]      Provider Number: [id number]  Attending Physician Name and Address:  Md, Trauma, MD  Relative Name and Phone Number:   Alegandra Sommers, husband, [phone]    Current Level of Care: Hospital Recommended Level of Care: [place] Prior Approval Number:    Date Approved/Denied:   PASRR Number: [id number]  Discharge Plan: SNF    Current Diagnoses: Patient Active Problem List   Diagnosis Date Noted  . SAH (subarachnoid hemorrhage) ([place]) 03/06/2018  . Chronic sinus infection 02/04/2018  . Chest tightness 02/04/2018  . Osteopenia 12/31/2017  . Lung nodule 12/10/2017  . Paroxysmal atrial fibrillation ([place]) 10/30/2017  . Shortness of breath 08/30/2017  . Chronic sinusitis 03/14/2017  . Nasal obstruction 03/14/2017  . Severe scoliosis 12/11/2016  . Prediabetes 06/06/2016  . Cough 06/06/2016  . Allergic rhinitis 02/08/2016  . Osteoporosis 12/05/2015  . Chest pain 05/27/2015  . Hyperlipidemia 05/27/2015  . 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 Internal hemorrhoids 08/23/2011  . Constipation 08/23/2011    Orientation RESPIRATION BLADDER Height & Weight     Self, Situation, Place, Time  Normal Continent, External catheter Weight: 154 lb 12.2 oz (70.2 kg) Height:  5\' 7"  (170.2 cm)  BEHAVIORAL SYMPTOMS/MOOD NEUROLOGICAL BOWEL NUTRITION STATUS      Continent  Diet (see discharge summary)  AMBULATORY STATUS COMMUNICATION OF NEEDS Skin   Extensive Assist Verbally Bruising                       Personal Care Assistance Level of Assistance  Bathing, Feeding, Dressing Bathing Assistance: Maximum assistance Feeding assistance:  Independent Dressing Assistance: Maximum assistance     Functional Limitations Info  Sight, Hearing, Speech Sight Info: Adequate Hearing Info: Adequate Speech Info: Adequate    SPECIAL CARE FACTORS FREQUENCY  OT (By licensed OT), PT (By licensed PT)     PT Frequency: 5x week OT Frequency: 5x week            Contractures Contractures Info: Not present    Additional Factors Info  Code Status, Allergies Code Status Info: Full Code Allergies Info: MOLD EXTRACT TRICHOPHYTON MENTAGROPHYTE, PENICILLINS, WHEAT, WHEAT BRAN, MORPHINE, PEANUT OIL, PROTONIX PANTOPRAZOLE SODIUM, TRAMADOL, VALIUM DIAZEPAM, CETIRIZINE, CODEINE, EGGS OR EGG-DERIVED PRODUCTS, PENTAZOCINE LACTATE            Current Medications (03/07/2018):  This is the current hospital active medication list Current Facility-Administered Medications  Medication Dose Route Frequency Provider Last Rate Last Dose  . 0.9 %  sodium chloride infusion   Intravenous Continuous Meuth, Brooke A, PA-C 75 mL/hr at 03/07/18 5400    . acetaminophen (TYLENOL) tablet 650 mg  650 mg Oral Q4H PRN Meuth, Brooke A, PA-C   650 mg at 03/07/18 1002  . albuterol (PROVENTIL) (2.5 MG/3ML) 0.083% nebulizer solution 2.5 mg  2.5 mg Inhalation Q4H PRN Meuth, Brooke A, PA-C      . budesonide (PULMICORT) nebulizer solution 0.25 mg  0.25 mg Nebulization BID Meuth, Brooke A, PA-C   0.25 mg at 03/07/18 0844  . docusate sodium (COLACE) capsule 100 mg  100 mg Oral BID Meuth, Brooke A, PA-C   100 mg at 03/07/18 1002  .  fluticasone (FLONASE) 50 MCG/ACT nasal spray 2 spray  2 spray Each Nare Daily Meuth, Brooke A, PA-C   2 spray at 03/07/18 1003  . hydrALAZINE (APRESOLINE) injection 10 mg  10 mg Intravenous Q2H PRN Meuth, Brooke A, PA-C      . HYDROmorphone (DILAUDID) injection 0.5 mg  0.5 mg Intravenous Q4H PRN Meuth, Brooke A, PA-C      . iopamidol (ISOVUE-370) 76 % injection           . ipratropium (ATROVENT) 0.06 % nasal spray 2 spray  2 spray Each Nare QID  PRN Meuth, Brooke A, PA-C      . ketotifen (ZADITOR) 0.025 % ophthalmic solution 1 drop  1 drop Both Eyes BID Meuth, Brooke A, PA-C   1 drop at 03/07/18 1004  . loratadine (CLARITIN) tablet 10 mg  10 mg Oral Daily Meuth, Brooke A, PA-C   10 mg at 03/07/18 1002  . methocarbamol (ROBAXIN) tablet 500 mg  500 mg Oral Q8H PRN Meuth, Brooke A, PA-C   500 mg at 03/07/18 0425  . metoprolol succinate (TOPROL-XL) 24 hr tablet 25 mg  25 mg Oral Daily Meuth, Brooke A, PA-C   25 mg at 03/07/18 1002  . ondansetron (ZOFRAN-ODT) disintegrating tablet 4 mg  4 mg Oral Q6H PRN Meuth, Brooke A, PA-C       Or  . ondansetron (ZOFRAN) injection 4 mg  4 mg Intravenous Q6H PRN Meuth, Brooke A, PA-C      . polyethylene glycol (MIRALAX / GLYCOLAX) packet 17 g  17 g Oral Daily Meuth, Brooke A, PA-C   17 g at 03/07/18 1003  . promethazine (PHENERGAN) injection 12.5 mg  12.5 mg Intravenous Q6H PRN Meuth, Brooke A, PA-C   12.5 mg at 03/07/18 1131  . tacrolimus (PROTOPIC) 0.1 % ointment 1 application  1 application Topical BID PRN Meuth, Brooke A, PA-C      . Tdap (BOOSTRIX) injection 0.5 mL  0.5 mL Intramuscular Once Meuth, Brooke A, PA-C         Discharge Medications: Please see discharge summary for a list of discharge medications.  Relevant Imaging Results:  Relevant Lab Results:   Additional Information SS# [place], [place]

## 2018-03-07 NOTE — Care Management Note (Signed)
Case Management Note  Patient Details  Name: UNIQUE SILLAS MRN: [id number] Date of Birth: 05-20-1951  Subjective/Objective:  Patient is a 67 y/o female with PMH significant for paroxysmal A. Fib, not currently on anticoagulation but history of eliquis, spinal fusion in 2012, and celiac aneurysmal repair in 2011.  She was admitted due to [facility] with abdominal contusion, SAH and celiac a. abnormality on CT scan.  PTA, pt independent with assistive device; lives with spouse.                    Action/Plan: Met with pt's daughter, Raquel Sarna at bedside.  (phone [phone]):  Family is interested in getting pt moved to inpatient rehab in [facility] area, as that is where all family lives.  She states that pt's home is not accessible, and that she could dc to her home after rehab.  We discussed multiple facilities in the area, with the understanding that pt's insurance would have to give authorization for rehab prior to any transfer.  Daughter is an EMT for [employer], and has access to ambulance transport upon dc.     Expected Discharge Date:                  Expected Discharge Plan:  IP Rehab Facility  In-House Referral:  Clinical Social Work  Discharge planning Services  CM Consult  Post Acute Care Choice:    Choice offered to:     DME Arranged:    DME Agency:     HH Arranged:    [place] Agency:     Status of Service:  In process, will continue to follow  If discussed at Long Length of Stay Meetings, dates discussed:    Additional Comments:  J. Siya Flurry, RN, BSN [phone] Notified by Raquel Sarna, daughter, that family has discussed options, and they prefer to stay here at [facility] for rehab.    03/07/18 J. Hiilei Gerst, Therapist, sports, BSN  60 Spoke with [employer] representative to check for rehab providers in [place] area.  Unfortunately, there are none, and pt would have to pay out of network benefits.  Days 1-6 would be $500/day, days 7-90 are $0 copay.  I shared this  information with pt's daughter; she plans to discuss this with the rest of her family and let me know if they want to do rehab at [facility] or go to [facility].  I have faxed referrals to [facility] (phone [phone], fax [phone], attention Wendie Chess) and [facility] (phone [phone], fax [phone], attention Caryl Pina), per family's request.      Reinaldo Raddle, RN, BSN  Trauma/Neuro ICU Case Manager [phone]

## 2018-03-07 NOTE — Progress Notes (Signed)
Patient's IV infiltrated with 20 mL of saline into left upper arm during CT scan.  No contrast extravascation.   PA to bedside to assess arm.  Swelling at site of infiltrate.  Non-tender.  No bruising. IV has been removed.  Tech to apply heat pad.   Patient informed.  No follow-up needed.  Brynda Greathouse, MS RD PA-C 1:49 PM
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
## 2018-03-07 NOTE — Evaluation (Signed)
Physical Therapy Evaluation Patient Details Name: Kara Ramirez MRN: [id number] DOB: February 27, 1951 Today's Date: 03/07/2018   History of Present Illness  Patient is a 67 y/o female with PMH significant for paroxysmal A. Fib, not currently on anticoagulation but history of eliquis, spinal fusion in 2012, and celiac aneurysmal repair in 2011.  She was admitted due to [facility] with abdominal contusion, SAH and celiac a. abnormality on CT scan.   Clinical Impression  Patient presents with decreased activity tolerance (N&V), decreased strength, decreased balance, decreased awareness, decreased cognition.  She typically uses a cane and can manage to perform ADL's/IADL's independently.  Currently she needs mod A for bed mobility and transfers and demonstrates visual changes in addition to cognitive changes.  She will benefit from skilled PT in inpatient rehab setting.  Family hopeful for a facility in their area as noted below.  PT to follow acutely.     Follow Up Recommendations Supervision/Assistance - 24 hour;CIR(family hopeful for rehab in [place] area)    Equipment Recommendations  None recommended by PT    Recommendations for Other Services       Precautions / Restrictions Precautions Precautions: Fall      Mobility  Bed Mobility Overal bed mobility: Needs Assistance Bed Mobility: Supine to Sit;Sit to Supine     Supine to sit: Mod assist;HOB elevated Sit to supine: Mod assist   General bed mobility comments: lifting assist for trunk and mod cues for technique; to supine assist to guide legs and to lower trunk due to pain  Transfers Overall transfer level: Needs assistance Equipment used: Rolling walker (2 wheeled) Transfers: Sit to/from [facility] Sit to Stand: Mod assist         General transfer comment: lifting help from Edge of bed, assist to turn walker and for balance/safety  Ambulation/Gait             General Gait Details: limited  bed <> BSC due to N&V  Stairs            Wheelchair Mobility    Modified Rankin (Stroke Patients Only)       Balance Overall balance assessment: Needs assistance   Sitting balance-Leahy Scale: Fair     Standing balance support: No upper extremity supported Standing balance-Leahy Scale: Poor Standing balance comment: took hands off walker for perineal hygiene and needed min support for balance                             Pertinent Vitals/Pain Pain Assessment: Faces Faces Pain Scale: Hurts whole lot Pain Location: chest Pain Descriptors / Indicators: Grimacing;Guarding;Discomfort Pain Intervention(s): Repositioned;Limited activity within patient's tolerance;Monitored during session;Ice applied    Home Living Family/patient expects to be discharged to:: Private residence Living Arrangements: Spouse/significant other(spouse in accident too) Available Help at Discharge: Family Type of Home: House Home Access: Stairs to enter Entrance Stairs-Rails: None Entrance Stairs-Number of Steps: 1-2 Home Layout: One level Home Equipment: Environmental consultant - 2 wheels;Cane - single point Additional Comments: normally uses cane    Prior Function Level of Independence: Independent with assistive device(s)               Hand Dominance        Extremity/Trunk Assessment   Upper Extremity Assessment Upper Extremity Assessment: Generalized weakness    Lower Extremity Assessment Lower Extremity Assessment: Generalized weakness       Communication   Communication: No difficulties  Cognition Arousal/Alertness: Lethargic Behavior  During Therapy: Flat affect Overall Cognitive Status: Impaired/Different from baseline Area of Impairment: Attention;Problem solving                   Current Attention Level: Sustained         Problem Solving: Slow processing        General Comments General comments (skin integrity, edema, etc.): two daughters and son here  from [place] area.  Discussed possibly going to rehab near them due to pt's spouse unable to assist her and house not accessible.  Briefly looked at vision due to pt report seeing spots on things and noted saccadic smooth pursuits and pt with diplopia in L far field    Exercises     Assessment/Plan    PT Assessment Patient needs continued PT services  PT Problem List Decreased strength;Decreased mobility;Decreased activity tolerance;Decreased balance;Decreased knowledge of use of DME;Pain       PT Treatment Interventions DME instruction;Therapeutic activities;Therapeutic exercise;Patient/family education;Gait training;Balance training;Functional mobility training    PT Goals (Current goals can be found in the Care Plan section)  Acute Rehab PT Goals Patient Stated Goal: Per family interested in inpatient rehab PT Goal Formulation: With family Time For Goal Achievement: 03/21/18 Potential to Achieve Goals: Good    Frequency Min 4X/week   Barriers to discharge        Co-evaluation               AM-PAC PT "6 Clicks" Daily Activity  Outcome Measure Difficulty turning over in bed (including adjusting bedclothes, sheets and blankets)?: Unable Difficulty moving from lying on back to sitting on the side of the bed? : Unable Difficulty sitting down on and standing up from a chair with arms (e.g., wheelchair, bedside commode, etc,.)?: Unable Help needed moving to and from a bed to chair (including a wheelchair)?: A Lot Help needed walking in hospital room?: Total Help needed climbing 3-5 steps with a railing? : Total 6 Click Score: 7    End of Session Equipment Utilized During Treatment: Gait belt Activity Tolerance: Patient limited by fatigue;Other (comment)(N&V) Patient left: in bed;with call bell/phone within reach;with family/visitor present   PT Visit Diagnosis: Other symptoms and signs involving the nervous system (R29.898);Pain;Other abnormalities of  gait and mobility (R26.89) Pain - part of body: (chest)    Time: 5400-8676 PT Time Calculation (min) (ACUTE ONLY): 38 min   Charges:   PT Evaluation $PT Eval High Complexity: 1 High PT Treatments $Therapeutic Activity: 8-22 mins   PT G CodesMagda Kiel, [place] [phone] 03/07/2018   Reginia Naas 03/07/2018, 12:58 PM

## 2018-03-07 NOTE — Progress Notes (Signed)
VASCULAR SURGERY ADDENDUM:  I reviewed the CT angiogram.  There does not appear to be an injury or dissection of the celiac axis.  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 Stable soft tissue attenuation about the celiac surgical repair compared to yesterday, favored to be postoperative rather than acute traumatic injury or dissection.  Deitra Mayo, MD, [place] [phone] Office: [phone]

## 2018-03-07 NOTE — Progress Notes (Signed)
Patient's IV infiltrated 20 ml of saline, 16 g left AC.  IV removed.

## 2018-03-07 NOTE — Consult Note (Signed)
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 functional goals is: 7-10 days 9. Anticipated D/C setting: Home 10. Anticipated post D/C treatments: [place] therapy 11. Overall Rehab/Functional Prognosis: excellent  RECOMMENDATIONS: This patient's condition is appropriate for continued rehabilitative care in the following setting: CIR Patient has agreed to participate in recommended program. Yes Note that insurance prior authorization may be required for reimbursement for recommended care.  Comment: Rehab Admissions Coordinator to follow up.  Thanks,  Meredith Staggers, MD, Mellody Drown    Lavon Paganini Angiulli, PA-C 03/07/2018

## 2018-03-07 NOTE — Progress Notes (Signed)
   Daily Progress Note  Recommend:   Recheck BMP to evaluate for any CIN from contrast yesterday  CTA abd/pelvis if renal function stable  Dr. Scot Dock will be covering for the practice today and over the weekend   Adele Barthel, MD, [facility] [place] Office: [phone] Pager: [phone]  03/07/2018, 5:53 AM

## 2018-03-08 ENCOUNTER — Encounter (HOSPITAL_COMMUNITY): Payer: Self-pay | Admitting: *Deleted

## 2018-03-08 LAB — URINALYSIS, ROUTINE W REFLEX MICROSCOPIC
Bilirubin Urine: NEGATIVE
Glucose, UA: NEGATIVE mg/dL
Ketones, ur: 20 mg/dL — AB
Nitrite: NEGATIVE
Protein, ur: NEGATIVE mg/dL
Specific Gravity, Urine: 1.023 (ref 1.005–1.030)
pH: 6 (ref 5.0–8.0)

## 2018-03-08 LAB — BASIC METABOLIC PANEL
Anion gap: 11 (ref 5–15)
BUN: 9 mg/dL (ref 6–20)
CO2: 23 mmol/L (ref 22–32)
Calcium: 9.3 mg/dL (ref 8.9–10.3)
Chloride: 105 mmol/L (ref 101–111)
Creatinine, Ser: 0.76 mg/dL (ref 0.44–1.00)
GFR calc Af Amer: >60 mL/min (ref >60)
GFR calc non Af Amer: >60 mL/min (ref >60)
Glucose, Bld: 96 mg/dL (ref 65–99)
Potassium: 3.5 mmol/L (ref 3.5–5.1)
Sodium: 139 mmol/L (ref 135–145)

## 2018-03-08 LAB — CBC
HCT: 36.5 % (ref 36.0–46.0)
Hemoglobin: 11.6 g/dL — ABNORMAL LOW (ref 12.0–15.0)
MCH: 27.1 pg (ref 26.0–34.0)
MCHC: 31.8 g/dL (ref 30.0–36.0)
MCV: 85.3 fL (ref 78.0–100.0)
Platelets: 195 10*3/uL (ref 150–400)
RBC: 4.28 MIL/uL (ref 3.87–5.11)
RDW: 13.9 % (ref 11.5–15.5)
WBC: 8.4 10*3/uL (ref 4.0–10.5)

## 2018-03-08 LAB — MRSA PCR SCREENING: MRSA by PCR: POSITIVE — AB

## 2018-03-08 MED ORDER — CHLORHEXIDINE GLUCONATE CLOTH 2 % EX PADS
6.0000 | MEDICATED_PAD | Freq: Every day | CUTANEOUS | Status: DC
  Administered 2018-03-08 – 2018-03-11 (×4): 6 via TOPICAL

## 2018-03-08 MED ORDER — MUPIROCIN 2 % EX OINT
1.0000 "application " | TOPICAL_OINTMENT | Freq: Two times a day (BID) | CUTANEOUS | Status: DC
  Administered 2018-03-08 – 2018-03-11 (×7): 1 via NASAL
  Filled 2018-03-08 (×3): qty 22

## 2018-03-08 NOTE — Progress Notes (Signed)
SLP Cancellation Note  Patient Details Name: Kara Ramirez MRN: [id number] DOB: Apr 13, 1951   Cancelled treatment:       Reason Eval/Treat Not Completed: Patient at procedure or test/unavailable. Pt working with PT; will continue efforts for cognitive-linguistic evaluation.  Kara Ramirez, [place], [place] Speech-Language Pathologist [phone]   Aliene Altes 03/08/2018, 3:58 PM
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
## 2018-03-08 NOTE — Progress Notes (Signed)
   VASCULAR SURGERY ASSESSMENT & PLAN:   Follow-up CT scan shows no evidence of injury to the celiac artery.  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  The findings on CT are consistent with postoperative findings rather than acute injury to the celiac axis.  Her abdomen is soft and nontender.  Vascular surgery will be available as needed.  SUBJECTIVE:   Still complains of some mild chest pain.  PHYSICAL EXAM:   Vitals:   03/07/18 2200 03/08/18 0009 03/08/18 0400 03/08/18 0600  BP: 112/81  126/76 121/72  Pulse: 72   74  Resp: (!) 23  (!) 26 (!) 21  Temp:  99.1 F (37.3 C) (!) 97.5 F (36.4 C)   TempSrc:  Axillary Oral   SpO2: 96%  97% 96%  Weight:      Height:       Abdomen is soft and nontender.  LABS:   Lab Results  Component Value Date   WBC 8.4 03/08/2018   HGB 11.6 (L) 03/08/2018   HCT 36.5 03/08/2018   MCV 85.3 03/08/2018   PLT 195 03/08/2018   Lab Results  Component Value Date   CREATININE 0.76 03/08/2018   Lab Results  Component Value Date   INR 1.08 03/06/2018    PROBLEM LIST:    Active Problems:   SAH (subarachnoid hemorrhage) (HCC)   CURRENT MEDS:   . budesonide (PULMICORT) nebulizer solution  0.25 mg Nebulization BID  . Chlorhexidine Gluconate Cloth  6 each Topical Q0600  . docusate sodium  100 mg Oral BID  . fluticasone  2 spray Each Nare Daily  . ketotifen  1 drop Both Eyes BID  . loratadine  10 mg Oral Daily  . metoprolol succinate  25 mg Oral Daily  . mupirocin ointment  1 application Nasal BID  . polyethylene glycol  17 g Oral Daily  . Tdap  0.5 mL Intramuscular Once    Deitra Mayo Beeper: [phone] Office: [phone] 03/08/2018
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
## 2018-03-09 ENCOUNTER — Encounter (HOSPITAL_COMMUNITY): Payer: Self-pay | Admitting: *Deleted

## 2018-03-09 LAB — BASIC METABOLIC PANEL
Anion gap: 11 (ref 5–15)
BUN: 11 mg/dL (ref 6–20)
CO2: 20 mmol/L — ABNORMAL LOW (ref 22–32)
Calcium: 8.7 mg/dL — ABNORMAL LOW (ref 8.9–10.3)
Chloride: 108 mmol/L (ref 101–111)
Creatinine, Ser: 0.69 mg/dL (ref 0.44–1.00)
GFR calc Af Amer: >60 mL/min (ref >60)
GFR calc non Af Amer: >60 mL/min (ref >60)
Glucose, Bld: 98 mg/dL (ref 65–99)
Potassium: 3.4 mmol/L — ABNORMAL LOW (ref 3.5–5.1)
Sodium: 139 mmol/L (ref 135–145)

## 2018-03-09 MED ORDER — SODIUM CHLORIDE 0.9% FLUSH
3.0000 mL | Freq: Two times a day (BID) | INTRAVENOUS | Status: DC
  Administered 2018-03-09 – 2018-03-11 (×5): 3 mL via INTRAVENOUS

## 2018-03-09 MED ORDER — SODIUM CHLORIDE 0.9 % IV SOLN: 250.0000 mL | INTRAVENOUS | Status: DC | PRN

## 2018-03-09 MED ORDER — SODIUM CHLORIDE 0.9% FLUSH: 3.0000 mL | INTRAVENOUS | Status: DC | PRN
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
## 2018-03-10 NOTE — Progress Notes (Signed)
Patient ID: Kara Ramirez, female   DOB: 08-07-1951, 67 y.o.   MRN: [id number] BP 117/88   Pulse 91   Temp 98.7 F (37.1 C)   Resp (!) 23   Ht 5\' 7"  (1.702 m)   Wt 70.2 kg (154 lb 12.2 oz)   SpO2 100%   BMI 24.24 kg/m  Alert, mild confusion will follow commands Moves all extremities  Slight improvement neurologically

## 2018-03-10 NOTE — Progress Notes (Addendum)
I met with patient and her spouse at bedside. We discussed goals and expectations of an inpt rehab admit. They are in agreement. I have begun insurance authorization with [employer]. I will follow up with team tomorrow with insurance determination. [phone]
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
## 2018-03-11 ENCOUNTER — Encounter (HOSPITAL_COMMUNITY): Payer: Self-pay | Admitting: Nurse Practitioner

## 2018-03-11 ENCOUNTER — Inpatient Hospital Stay (HOSPITAL_COMMUNITY)
Admission: RE | Admit: 2018-03-11 | Discharge: 2018-03-18 | DRG: 092 | Disposition: A | Discharge status: Home Health | Payer: PPO | Source: Intra-hospital | Attending: Physical Medicine & Rehabilitation | Admitting: Physical Medicine & Rehabilitation

## 2018-03-11 ENCOUNTER — Encounter (HOSPITAL_COMMUNITY): Payer: Self-pay | Admitting: Physical Medicine and Rehabilitation

## 2018-03-11 DIAGNOSIS — J302 Other seasonal allergic rhinitis: Secondary | ICD-10-CM | POA: Diagnosis present

## 2018-03-11 DIAGNOSIS — I480 Paroxysmal atrial fibrillation: Secondary | ICD-10-CM | POA: Diagnosis present

## 2018-03-11 DIAGNOSIS — D62 Acute posthemorrhagic anemia: Secondary | ICD-10-CM | POA: Diagnosis not present

## 2018-03-11 DIAGNOSIS — Z9842 Cataract extraction status, left eye: Secondary | ICD-10-CM | POA: Diagnosis not present

## 2018-03-11 DIAGNOSIS — J45909 Unspecified asthma, uncomplicated: Secondary | ICD-10-CM | POA: Diagnosis present

## 2018-03-11 DIAGNOSIS — E785 Hyperlipidemia, unspecified: Secondary | ICD-10-CM | POA: Diagnosis present

## 2018-03-11 DIAGNOSIS — E876 Hypokalemia: Secondary | ICD-10-CM | POA: Diagnosis not present

## 2018-03-11 DIAGNOSIS — Z9841 Cataract extraction status, right eye: Secondary | ICD-10-CM | POA: Diagnosis not present

## 2018-03-11 DIAGNOSIS — Z888 Allergy status to other drugs, medicaments and biological substances status: Secondary | ICD-10-CM

## 2018-03-11 DIAGNOSIS — Z825 Family history of asthma and other chronic lower respiratory diseases: Secondary | ICD-10-CM | POA: Diagnosis not present

## 2018-03-11 DIAGNOSIS — Z8249 Family history of ischemic heart disease and other diseases of the circulatory system: Secondary | ICD-10-CM | POA: Diagnosis not present

## 2018-03-11 DIAGNOSIS — Z7901 Long term (current) use of anticoagulants: Secondary | ICD-10-CM | POA: Diagnosis not present

## 2018-03-11 DIAGNOSIS — M797 Fibromyalgia: Secondary | ICD-10-CM | POA: Diagnosis present

## 2018-03-11 DIAGNOSIS — Z90710 Acquired absence of both cervix and uterus: Secondary | ICD-10-CM

## 2018-03-11 DIAGNOSIS — Z91012 Allergy to eggs: Secondary | ICD-10-CM | POA: Diagnosis not present

## 2018-03-11 DIAGNOSIS — S066X9S Traumatic subarachnoid hemorrhage with loss of consciousness of unspecified duration, sequela: Principal | ICD-10-CM

## 2018-03-11 DIAGNOSIS — Z800 Family history of malignant neoplasm of digestive organs: Secondary | ICD-10-CM | POA: Diagnosis not present

## 2018-03-11 DIAGNOSIS — Z87442 Personal history of urinary calculi: Secondary | ICD-10-CM | POA: Diagnosis not present

## 2018-03-11 DIAGNOSIS — K581 Irritable bowel syndrome with constipation: Secondary | ICD-10-CM | POA: Diagnosis not present

## 2018-03-11 DIAGNOSIS — S060X9S Concussion with loss of consciousness of unspecified duration, sequela: Secondary | ICD-10-CM | POA: Diagnosis present

## 2018-03-11 DIAGNOSIS — Z885 Allergy status to narcotic agent status: Secondary | ICD-10-CM

## 2018-03-11 DIAGNOSIS — T1490XA Injury, unspecified, initial encounter: Secondary | ICD-10-CM | POA: Diagnosis present

## 2018-03-11 DIAGNOSIS — Z791 Long term (current) use of non-steroidal anti-inflammatories (NSAID): Secondary | ICD-10-CM

## 2018-03-11 DIAGNOSIS — Z880 Allergy status to penicillin: Secondary | ICD-10-CM

## 2018-03-11 DIAGNOSIS — I609 Nontraumatic subarachnoid hemorrhage, unspecified: Secondary | ICD-10-CM

## 2018-03-11 DIAGNOSIS — H01009 Unspecified blepharitis unspecified eye, unspecified eyelid: Secondary | ICD-10-CM | POA: Diagnosis not present

## 2018-03-11 DIAGNOSIS — J9811 Atelectasis: Secondary | ICD-10-CM | POA: Diagnosis not present

## 2018-03-11 DIAGNOSIS — H538 Other visual disturbances: Secondary | ICD-10-CM | POA: Diagnosis not present

## 2018-03-11 DIAGNOSIS — Z8261 Family history of arthritis: Secondary | ICD-10-CM

## 2018-03-11 DIAGNOSIS — H04123 Dry eye syndrome of bilateral lacrimal glands: Secondary | ICD-10-CM | POA: Diagnosis not present

## 2018-03-11 DIAGNOSIS — J309 Allergic rhinitis, unspecified: Secondary | ICD-10-CM | POA: Diagnosis present

## 2018-03-11 DIAGNOSIS — M1990 Unspecified osteoarthritis, unspecified site: Secondary | ICD-10-CM | POA: Diagnosis present

## 2018-03-11 DIAGNOSIS — Z7951 Long term (current) use of inhaled steroids: Secondary | ICD-10-CM

## 2018-03-11 DIAGNOSIS — Z91018 Allergy to other foods: Secondary | ICD-10-CM

## 2018-03-11 DIAGNOSIS — S069X1S Unspecified intracranial injury with loss of consciousness of 30 minutes or less, sequela: Secondary | ICD-10-CM

## 2018-03-11 HISTORY — DX: Personal history of urinary calculi: Z87.442

## 2018-03-11 MED ORDER — IPRATROPIUM BROMIDE 0.06 % NA SOLN: 2.0000 | Freq: Four times a day (QID) | NASAL | Status: DC | PRN

## 2018-03-11 MED ORDER — DIPHENHYDRAMINE HCL 12.5 MG/5ML PO ELIX: 12.5000 mg | ORAL_SOLUTION | Freq: Four times a day (QID) | ORAL | Status: DC | PRN

## 2018-03-11 MED ORDER — FLUTICASONE PROPIONATE 50 MCG/ACT NA SUSP
2.0000 | Freq: Every day | NASAL | Status: DC
  Administered 2018-03-12 – 2018-03-18 (×7): 2 via NASAL
  Filled 2018-03-11: qty 16

## 2018-03-11 MED ORDER — METOPROLOL SUCCINATE ER 25 MG PO TB24
25.0000 mg | ORAL_TABLET | Freq: Every day | ORAL | Status: DC
  Administered 2018-03-12 – 2018-03-18 (×7): 25 mg via ORAL
  Filled 2018-03-11 (×7): qty 1

## 2018-03-11 MED ORDER — ALBUTEROL SULFATE (2.5 MG/3ML) 0.083% IN NEBU: 2.5000 mg | INHALATION_SOLUTION | RESPIRATORY_TRACT | Status: DC | PRN

## 2018-03-11 MED ORDER — ENOXAPARIN SODIUM 40 MG/0.4ML ~~LOC~~ SOLN
40.0000 mg | SUBCUTANEOUS | Status: DC
  Administered 2018-03-11: 40 mg via SUBCUTANEOUS
  Filled 2018-03-11: qty 0.4

## 2018-03-11 MED ORDER — MUPIROCIN 2 % EX OINT
1.0000 "application " | TOPICAL_OINTMENT | Freq: Two times a day (BID) | CUTANEOUS | Status: AC
  Administered 2018-03-11 – 2018-03-12 (×3): 1 via NASAL
  Filled 2018-03-11 (×2): qty 22

## 2018-03-11 MED ORDER — POLYETHYLENE GLYCOL 3350 17 G PO PACK: 17.0000 g | PACK | Freq: Every day | ORAL | Status: DC | PRN

## 2018-03-11 MED ORDER — PROCHLORPERAZINE EDISYLATE 10 MG/2ML IJ SOLN: 5.0000 mg | Freq: Four times a day (QID) | INTRAMUSCULAR | Status: DC | PRN

## 2018-03-11 MED ORDER — BUDESONIDE 0.25 MG/2ML IN SUSP
0.2500 mg | Freq: Two times a day (BID) | RESPIRATORY_TRACT | Status: DC
  Administered 2018-03-11 – 2018-03-18 (×13): 0.25 mg via RESPIRATORY_TRACT
  Filled 2018-03-11 (×14): qty 2

## 2018-03-11 MED ORDER — ONDANSETRON 4 MG PO TBDP
4.0000 mg | ORAL_TABLET | Freq: Four times a day (QID) | ORAL | Status: DC | PRN
  Filled 2018-03-11: qty 1

## 2018-03-11 MED ORDER — PROCHLORPERAZINE MALEATE 5 MG PO TABS: 5.0000 mg | ORAL_TABLET | Freq: Four times a day (QID) | ORAL | Status: DC | PRN

## 2018-03-11 MED ORDER — SALINE SPRAY 0.65 % NA SOLN
1.0000 | NASAL | Status: DC | PRN
  Filled 2018-03-11: qty 44

## 2018-03-11 MED ORDER — PROCHLORPERAZINE 25 MG RE SUPP: 12.5000 mg | Freq: Four times a day (QID) | RECTAL | Status: DC | PRN

## 2018-03-11 MED ORDER — ALUM & MAG HYDROXIDE-SIMETH 200-200-20 MG/5ML PO SUSP: 30.0000 mL | ORAL | Status: DC | PRN

## 2018-03-11 MED ORDER — CHLORHEXIDINE GLUCONATE CLOTH 2 % EX PADS
6.0000 | MEDICATED_PAD | Freq: Every day | CUTANEOUS | Status: AC
  Administered 2018-03-12: 6 via TOPICAL

## 2018-03-11 MED ORDER — VITAMIN B-1 100 MG PO TABS
100.0000 mg | ORAL_TABLET | Freq: Every day | ORAL | Status: DC
  Administered 2018-03-12 – 2018-03-18 (×7): 100 mg via ORAL
  Filled 2018-03-11 (×7): qty 1

## 2018-03-11 MED ORDER — TRAZODONE HCL 50 MG PO TABS: 25.0000 mg | ORAL_TABLET | Freq: Every evening | ORAL | Status: DC | PRN

## 2018-03-11 MED ORDER — LORATADINE 10 MG PO TABS
10.0000 mg | ORAL_TABLET | Freq: Every day | ORAL | Status: DC
  Administered 2018-03-12 – 2018-03-18 (×7): 10 mg via ORAL
  Filled 2018-03-11 (×7): qty 1

## 2018-03-11 MED ORDER — ACETAMINOPHEN 325 MG PO TABS: 650.0000 mg | ORAL_TABLET | ORAL | Status: DC | PRN

## 2018-03-11 MED ORDER — POLYETHYLENE GLYCOL 3350 17 G PO PACK
17.0000 g | PACK | Freq: Every day | ORAL | Status: DC
  Administered 2018-03-12 – 2018-03-15 (×4): 17 g via ORAL
  Filled 2018-03-11 (×7): qty 1

## 2018-03-11 MED ORDER — DOCUSATE SODIUM 100 MG PO CAPS
100.0000 mg | ORAL_CAPSULE | Freq: Two times a day (BID) | ORAL | Status: DC
  Administered 2018-03-11 – 2018-03-18 (×13): 100 mg via ORAL
  Filled 2018-03-11 (×14): qty 1

## 2018-03-11 MED ORDER — GUAIFENESIN-DM 100-10 MG/5ML PO SYRP: 5.0000 mL | ORAL_SOLUTION | Freq: Four times a day (QID) | ORAL | Status: DC | PRN

## 2018-03-11 MED ORDER — TACROLIMUS 0.1 % EX OINT: 1.0000 "application " | TOPICAL_OINTMENT | Freq: Two times a day (BID) | CUTANEOUS | Status: DC | PRN

## 2018-03-11 MED ORDER — METHOCARBAMOL 500 MG PO TABS
500.0000 mg | ORAL_TABLET | Freq: Three times a day (TID) | ORAL | Status: DC | PRN
  Filled 2018-03-11 (×2): qty 1

## 2018-03-11 MED ORDER — FLEET ENEMA 7-19 GM/118ML RE ENEM: 1.0000 | ENEMA | Freq: Once | RECTAL | Status: DC | PRN

## 2018-03-11 MED ORDER — ONDANSETRON HCL 4 MG/2ML IJ SOLN: 4.0000 mg | Freq: Four times a day (QID) | INTRAMUSCULAR | Status: DC | PRN

## 2018-03-11 MED ORDER — KETOTIFEN FUMARATE 0.025 % OP SOLN
1.0000 [drp] | Freq: Two times a day (BID) | OPHTHALMIC | Status: DC
  Administered 2018-03-11 – 2018-03-18 (×14): 1 [drp] via OPHTHALMIC
  Filled 2018-03-11: qty 5

## 2018-03-11 MED ORDER — ACETAMINOPHEN 325 MG PO TABS
325.0000 mg | ORAL_TABLET | ORAL | Status: DC | PRN
  Administered 2018-03-11 – 2018-03-14 (×8): 650 mg via ORAL
  Administered 2018-03-15: 325 mg via ORAL
  Administered 2018-03-15 – 2018-03-17 (×6): 650 mg via ORAL
  Filled 2018-03-11 (×15): qty 2

## 2018-03-11 MED ORDER — BISACODYL 10 MG RE SUPP: 10.0000 mg | Freq: Every day | RECTAL | Status: DC | PRN

## 2018-03-11 MED ORDER — ENOXAPARIN SODIUM 40 MG/0.4ML ~~LOC~~ SOLN
40.0000 mg | SUBCUTANEOUS | Status: DC
  Administered 2018-03-12 – 2018-03-17 (×6): 40 mg via SUBCUTANEOUS
  Filled 2018-03-11 (×7): qty 0.4
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
## 2018-03-11 NOTE — Progress Notes (Addendum)
Patient is discharged from room 4N13 and transferred to unit 4W25 at this time. Alert and in stable condition. IV site d/c'd and patient verbalized understanding about transferring to CIR. Report given to receiving nurse Loree Fee, RN with all questions answered.Transported out of unit via bed with husband and all belongings at side.
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
## 2018-03-11 NOTE — Discharge Instructions (Signed)
Subarachnoid Hemorrhage Subarachnoid hemorrhage is bleeding in the area between the brain and the membrane that covers the brain. The bleeding puts more pressure on the brain and stops blood from reaching some areas of the brain. It is very serious. It may cause brain damage, stroke, or death if not treated. You must be treated in the hospital right away. What increases the risk? You may be more likely to have this condition if you:  Smoke.  Have high blood pressure (hypertension).  Drink too much alcohol.  Are a female, especially after menopause.  Have a family history of disease in the blood vessels of the brain.  Have a certain inherited kidney disease or connective tissue disease.  What are the signs or symptoms?  Having a sudden, severe headache.  Feeling sick to your stomach (nauseous) or throwing up (vomiting) combined with other problems.  Suddenly feeling weak.  Losing feeling on your face, arm, or leg, especially on one side of the body.  Suddenly having trouble walking or moving your arms or legs.  Suddenly feeling confused.  Suddenly having a change in mood or personality.  Having trouble talking or understanding.  Having trouble swallowing.  Suddenly having trouble seeing.  Seeing double.  Feeling dizzy.  Losing your balance or coordination.  Having light bother or hurt your eyes.  Having a stiff neck. Follow these instructions at home:  Take all medicines exactly as told by your doctor.  Eat healthy foods if you can swallow. ? Eat foods that are low in salt and cholesterol. ? Eat foods that are low in saturated and trans fat. ? If told, eat soft or pureed foods so that you do not choke. ? If told, take small bites of food so that you do not choke.  Rest as told by your doctor.  Limit your activity as told by your doctor.  Do not smoke.  Limit how much alcohol you drink. ? Men-drink no more than 2 drinks a day. ? Women who are not  pregnant-drink no more than 1 drink a day.  Make changes to your lifestyle as told by your doctor.  Keep track of your blood pressure as told by your doctor.  Keep your home safe so you do not fall. ? Put grab bars in the bedroom and bathroom. ? Raise toilet seats. ? Put a seat in the shower.  Go to therapy sessions as told by your doctor. This may include physical, occupational, and speech therapy.  Use a walker or cane at all times, if told to do so.  Keep all follow-up visits with your doctor and other specialists. Get help right away if:  You have a sudden, severe headache with no known cause.  You are sick to your stomach or throw up, and have another problem.  You have a sudden weakness.  You lose feeling on one side of your body.  You suddenly have trouble walking or moving arms or legs.  You suddenly feel confused.  You have trouble talking or understanding.  You suddenly have trouble seeing.  You lose your balance or your movements are not coordinated.  You have a stiff neck.  You have trouble breathing.  You are partly or totally unaware of what is going on around you. The symptoms above may be a sign of a serious problem that is an emergency. Do not wait to see if the symptoms will go away. Get medical help right away. Call your local emergency services (911 in  stiff neck.  · You have trouble breathing.  · You are partly or totally unaware of what is going on around you.  The symptoms above may be a sign of a serious problem that is an emergency. Do not wait to see if the symptoms will go away. Get medical help right away. Call your local emergency services (911 in [place] Do not drive yourself to the hospital.  This information is not intended to replace advice given to you by your health care provider. Make sure you discuss any questions you have with your health care provider.  Document Released: 03/02/2013 Document Revised: 04/12/2016 Document Reviewed: 12/19/2012  Elsevier Interactive Patient Education © 2018 [employer]
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
## 2018-03-11 NOTE — Progress Notes (Signed)
I have insurance approval to admit pt to inpt rehab today and bed available. I met with pt and spouse  at bedside and they are in agreement to admit. I have notified Trauma team and RN CM. I will make the arrangements to admit today. [phone]

## 2018-03-11 NOTE — Progress Notes (Signed)
Patient ID: Kara Ramirez, female   DOB: 03/15/1951, 67 y.o.   MRN: [id number] Patient admitted to [phone] via bed, escorted by nursing staff and spouse.  Patient and spouse verbalized understanding of rehab process, specifically fall prevention policy.  Patient appears to be in no immediate distress at this time.  Brita Romp, RN
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
## 2018-03-12 ENCOUNTER — Inpatient Hospital Stay (HOSPITAL_COMMUNITY): Payer: PPO | Admitting: Speech Pathology

## 2018-03-12 ENCOUNTER — Inpatient Hospital Stay (HOSPITAL_COMMUNITY): Payer: PPO

## 2018-03-12 ENCOUNTER — Telehealth: Payer: Self-pay

## 2018-03-12 ENCOUNTER — Inpatient Hospital Stay (HOSPITAL_COMMUNITY): Payer: PPO | Admitting: Occupational Therapy

## 2018-03-12 LAB — CBC WITH DIFFERENTIAL/PLATELET
Basophils Absolute: 0 10*3/uL (ref 0.0–0.1)
Basophils Relative: 1 %
Eosinophils Absolute: 0.3 10*3/uL (ref 0.0–0.7)
Eosinophils Relative: 5 %
HCT: 36.6 % (ref 36.0–46.0)
Hemoglobin: 11.7 g/dL — ABNORMAL LOW (ref 12.0–15.0)
Lymphocytes Relative: 33 %
Lymphs Abs: 2.1 10*3/uL (ref 0.7–4.0)
MCH: 27 pg (ref 26.0–34.0)
MCHC: 32 g/dL (ref 30.0–36.0)
MCV: 84.3 fL (ref 78.0–100.0)
Monocytes Absolute: 0.6 10*3/uL (ref 0.1–1.0)
Monocytes Relative: 9 %
Neutro Abs: 3.3 10*3/uL (ref 1.7–7.7)
Neutrophils Relative %: 52 %
Platelets: 208 10*3/uL (ref 150–400)
RBC: 4.34 MIL/uL (ref 3.87–5.11)
RDW: 13.8 % (ref 11.5–15.5)
WBC: 6.3 10*3/uL (ref 4.0–10.5)

## 2018-03-12 LAB — COMPREHENSIVE METABOLIC PANEL
ALT: 25 U/L (ref 14–54)
AST: 23 U/L (ref 15–41)
Albumin: 3.4 g/dL — ABNORMAL LOW (ref 3.5–5.0)
Alkaline Phosphatase: 57 U/L (ref 38–126)
Anion gap: 8 (ref 5–15)
BUN: 14 mg/dL (ref 6–20)
CO2: 30 mmol/L (ref 22–32)
Calcium: 9.3 mg/dL (ref 8.9–10.3)
Chloride: 106 mmol/L (ref 101–111)
Creatinine, Ser: 0.75 mg/dL (ref 0.44–1.00)
GFR calc Af Amer: >60 mL/min (ref >60)
GFR calc non Af Amer: >60 mL/min (ref >60)
Glucose, Bld: 109 mg/dL — ABNORMAL HIGH (ref 65–99)
Potassium: 4.3 mmol/L (ref 3.5–5.1)
Sodium: 144 mmol/L (ref 135–145)
Total Bilirubin: 0.7 mg/dL (ref 0.3–1.2)
Total Protein: 6.2 g/dL — ABNORMAL LOW (ref 6.5–8.1)
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
## 2018-03-12 NOTE — Plan of Care (Signed)
  Problem: Consults Goal: RH BRAIN INJURY PATIENT EDUCATION Description Description: See Patient Education module for eduction specifics Outcome: Progressing Goal: Skin Care Protocol Initiated - if Braden Score 18 or less Description If consults are not indicated, leave blank or document N/A Outcome: Progressing   Problem: RH BOWEL ELIMINATION Goal: RH STG MANAGE BOWEL WITH ASSISTANCE Description STG Manage Bowel with mod I Assistance.  Outcome: Progressing Goal: RH STG MANAGE BOWEL W/MEDICATION W/ASSISTANCE Description STG Manage Bowel with Medication with mod I Assistance.  Outcome: Progressing   Problem: RH SKIN INTEGRITY Goal: RH STG SKIN FREE OF INFECTION/BREAKDOWN Description Patients skin will remain free from further infection or breakdown with min assist.  Outcome: Progressing Goal: RH STG MAINTAIN SKIN INTEGRITY WITH ASSISTANCE Description STG Maintain Skin Integrity With min Assistance.  Outcome: Progressing Goal: RH STG ABLE TO PERFORM INCISION/WOUND CARE W/ASSISTANCE Description STG Able To Perform Incision/Wound Care With min Assistance.  Outcome: Progressing   Problem: RH SAFETY Goal: RH STG ADHERE TO SAFETY PRECAUTIONS W/ASSISTANCE/DEVICE Description STG Adhere to Safety Precautions With supervision Assistance/Device.  Outcome: Progressing   Problem: RH PAIN MANAGEMENT Goal: RH STG PAIN MANAGED AT OR BELOW PT'S PAIN GOAL Description < 4- managed with prn medications and nonpharmacological interventions  Outcome: Progressing
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
## 2018-03-13 ENCOUNTER — Inpatient Hospital Stay (HOSPITAL_COMMUNITY): Payer: PPO

## 2018-03-13 ENCOUNTER — Inpatient Hospital Stay (HOSPITAL_COMMUNITY): Payer: PPO | Admitting: Occupational Therapy

## 2018-03-13 ENCOUNTER — Inpatient Hospital Stay (HOSPITAL_COMMUNITY): Payer: PPO | Admitting: Speech Pathology

## 2018-03-13 ENCOUNTER — Inpatient Hospital Stay (HOSPITAL_COMMUNITY): Payer: PPO | Admitting: Physical Therapy

## 2018-03-13 DIAGNOSIS — D62 Acute posthemorrhagic anemia: Secondary | ICD-10-CM

## 2018-03-13 DIAGNOSIS — E876 Hypokalemia: Secondary | ICD-10-CM
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
## 2018-03-13 NOTE — Care Management (Signed)
[facility]patient [facility] Individual Statement of Services  Patient Name:  Kara Ramirez  Date:  03/13/2018  Welcome to the [facility].  Our goal is to provide you with an individualized program based on your diagnosis and situation, designed to meet your specific needs.  With this comprehensive rehabilitation program, you will be expected to participate in at least 3 hours of rehabilitation therapies Monday-Friday, with modified therapy programming on the weekends.  Your rehabilitation program will include the following services:  Physical Therapy (PT), Occupational Therapy (OT), Speech Therapy (ST), 24 hour per day rehabilitation nursing, Therapeutic Recreaction (TR), Neuropsychology, Case Management (Social Worker), Rehabilitation Medicine, Nutrition Services and Pharmacy Services  Weekly team conferences will be held on Tuesdays to discuss your progress.  Your Social Worker will talk with you frequently to get your input and to update you on team discussions.  Team conferences with you and your family in attendance may also be held.  Expected length of stay: 7 days    Overall anticipated outcome: supervision to modified independent  Depending on your progress and recovery, your program may change. Your Social Worker will coordinate services and will keep you informed of any changes. Your Social Worker's name and contact numbers are listed  below.  The following services may also be recommended but are not provided by the [facility] will be made to provide these services after discharge if needed.  Arrangements include referral to agencies that provide these services.  Your insurance has been verified to be:  [employer] Your primary doctor is:  Billey Gosling  Pertinent information will be shared with your doctor and  your insurance company.  Social Worker:  [place], [place] or ([phone]   Information discussed with and copy given to patient by: Lennart Pall, 03/13/2018, 10:32 AM

## 2018-03-13 NOTE — Progress Notes (Addendum)
[facility] PHYSICAL MEDICINE & REHABILITATION     PROGRESS NOTE    Subjective/Complaints: Patient complains of some ongoing chest discomfort as well as pain in the left hip and knee with activities.  Is able to work through it and doing well with therapy.s  ROS: Patient denies fever, rash, sore throat, blurred vision, nausea, vomiting, diarrhea, cough, shortness of breath or chest pain, joint or back pain, headache, or mood change.   Objective: Vital Signs: Blood pressure 92/60, pulse 90, temperature 98.4 F (36.9 C), temperature source Oral, resp. rate 18, weight 70.1 kg (154 lb 8.7 oz), SpO2 96 %. No results found. Recent Labs    03/12/18 0446  WBC 6.3  HGB 11.7*  HCT 36.6  PLT 208   Recent Labs    03/12/18 0446  NA 144  K 4.3  CL 106  GLUCOSE 109*  BUN 14  CREATININE 0.75  CALCIUM 9.3   CBG (last 3)  No results for input(s): GLUCAP in the last 72 hours.  Wt Readings from Last 3 Encounters:  03/12/18 70.1 kg (154 lb 8.7 oz)  03/06/18 70.2 kg (154 lb 12.2 oz)  02/17/18 71.2 kg (157 lb)    Physical Exam:  Constitutional: No distress . Vital signs reviewed. HEENT: EOMI, oral membranes moist Neck: supple Cardiovascular: RRR without murmur. No JVD    Respiratory: CTA Bilaterally without wheezes or rales. Normal effort    GI: BS +, non-tender, non-distended  Musculoskeletal: She exhibits noedemaor tenderness. Resolving ecchymosis left lateral knee with healing abrasion.  Left knee range of motion is a bit limited in extension and flexion but functional.  Mild right ankle tenderness with PROM, weight bearing. Neurological: She isalertand oriented to person, place, and time. Mild processing delays. Follows simple commands. Reasonable insight and awareness. Motor 5/5 UE's bilaterally. LE's 4/5 prox to distal.May be half a grade weaker in the left upper extremity as opposed to the right upper extremity.  Pain plays a role as well Skin: Few scattered bruises as  noted above Psychiatric: Normal affect, cooperative and pleasant   Assessment/Plan: 1.  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 Physiatrist is providing close team supervision and 24 hour management of active medical problems listed below. Physiatrist and rehab team continue to assess barriers to discharge/monitor patient progress toward functional and medical goals.  Function:  Bathing Bathing position   Position: Shower  Bathing parts Body parts bathed by patient: Right arm, Left arm, Chest, Abdomen, Front perineal area, Buttocks, Right upper leg, Left upper leg, Right lower leg, Left lower leg Body parts bathed by helper: Back  Bathing assist Assist Level: Touching or steadying assistance(Pt > 75%)      Upper Body Dressing/Undressing Upper body dressing   What is the patient wearing?: Pull over shirt/dress     Pull over shirt/dress - Perfomed by patient: Thread/unthread right sleeve, Thread/unthread left sleeve, Put head through opening, Pull shirt over trunk          Upper body assist Assist Level: Supervision or verbal cues, Set up   Set up : To obtain clothing/put away  Lower Body Dressing/Undressing Lower body dressing   What is the patient wearing?: Underwear, Pants, Non-skid slipper socks Underwear - Performed by patient: Thread/unthread right underwear leg, Thread/unthread left underwear leg, Pull underwear up/down   Pants- Performed by patient: Thread/unthread right pants leg, Thread/unthread left pants leg, Pull pants up/down  Non-skid slipper socks- Performed by helper: Don/doff right sock, Don/doff left sock                  Lower body assist Assist for lower body dressing: Set up, Supervision or verbal cues   Set up : To obtain clothing/put away  Toileting Toileting   Toileting steps completed by patient: Adjust  clothing prior to toileting, Performs perineal hygiene, Adjust clothing after toileting   Toileting Assistive Devices: Grab bar or rail  Toileting assist Assist level: Touching or steadying assistance (Pt.75%)   Transfers Chair/bed transfer   Chair/bed transfer method: Stand pivot Chair/bed transfer assist level: Touching or steadying assistance (Pt > 75%)       Locomotion Ambulation     Max distance: 25 Assist level: Moderate assist (Pt 50 - 74%)   Wheelchair   Type: Manual Max wheelchair distance: 15 Assist Level: Moderate assistance (Pt 50 - 74%)  Cognition Comprehension Comprehension assist level: Follows complex conversation/direction with extra time/assistive device, Understands complex 90% of the time/cues 10% of the time  Expression Expression assist level: Expresses basic needs/ideas: With no assist  Social Interaction Social Interaction assist level: Interacts appropriately with others with medication or extra time (anti-anxiety, antidepressant).  Problem Solving Problem solving assist level: Solves complex 90% of the time/cues < 10% of the time  Memory Memory assist level: Recognizes or recalls 75 - 89% of the time/requires cueing 10 - 24% of the time   Medical Problem List and Plan: 1.Functional deficitssecondary to bilateral traumatic subarachnoid hemorrhages -Continue therapies.  Focusing on balance and safety.  Therapy also helping patient to work through pain 2. DVT Prophylaxis/Anticoagulation: Pharmaceutical:Lovenox 3.Chronic HA/Fibromyalgia/Pain Management:Used Naprosyn prn at home.Robaxin or tylenol prn, ice/local care to chest wall -Continue conservative measures for pain   -No NSAIDs at this point given [place] 4. Mood:LCSW to follow for evaluation and support. 5. Neuropsych: This patientiscapable of making decisions on herown behalf. 6. Skin/Wound Care:Routine pressure relief measures. 7.  Fluids/Electrolytes/Nutrition:Intake reasonable so far 8. ABLA:  .  Hemoglobin up to 11.7 on 4/24 9. Hypokalemia: supplemented. K+ 4.3 on 4/24  10. PAF: Monitor HR bid. Continue metoprolol bid.  Eliquis on hold due to recent TBI.  11. Bilateral atelectasis: encourage IS with flutter valve.  12. Asthma: Continue budesonide nebs bid. No apparent issues at present   LOS (Days) [place]  Meredith Staggers, MD 03/13/2018 10:40 AM

## 2018-03-13 NOTE — Progress Notes (Signed)
Speech Language Pathology Daily Session Note  Patient Details  Name: Kara Ramirez MRN: [id number] Date of Birth: [birth year]  Today's Date: 03/13/2018 SLP Individual Time: 1450-1532 SLP Individual Time Calculation (min): 42 min  Short Term Goals: Week 1: SLP Short Term Goal 1 (Week 1): Pt will utilize external memory aids to recall new daily information with supervision cues.  SLP Short Term Goal 2 (Week 1): Pt will complete complex problem solving tasks with supervision cues.  SLP Short Term Goal 3 (Week 1): Pt will alternate attention in moderately distracting environment with supervision cues.  SLP Short Term Goal 4 (Week 1): Pt will demonstrate anticipatory awareness by listing 3 activities that are safe to participate in and unsafe to perform with supervision cues.   Skilled Therapeutic Interventions:  Pt was seen for skilled ST targeting cognitive goals.  SLP facilitated the session with a semi-complex scheduling task to address problem solving goals.  Pt completed task for 100% accuracy with mod I.  Pt requested ideas of activities to work on in between therapy sessions and therapist provided suggestions for cognitive remediation/compensation.  Pt was transferred back to bed and left with bed alarm set and call bell within reach.  Continue per current plan of care.     Function:  Eating Eating   Modified Consistency Diet: No Eating Assist Level: More than reasonable amount of time           Cognition Comprehension Comprehension assist level: Follows complex conversation/direction with extra time/assistive device  Expression   Expression assist level: Expresses complex 90% of the time/cues < 10% of the time  Social Interaction Social Interaction assist level: Interacts appropriately with others with medication or extra time (anti-anxiety, antidepressant).  Problem Solving Problem solving assist level: Solves complex 90% of the time/cues < 10% of the time  Memory Memory assist  level: Recognizes or recalls 90% of the time/requires cueing < 10% of the time    Pain Pain Assessment Pain Scale: 0-10 Pain Score: 0-No pain   Therapy/Group: Individual Therapy  Jackalyn Haith, Selinda Orion 03/13/2018, 4:08 PM
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
## 2018-03-13 NOTE — Plan of Care (Signed)
  Problem: Consults Goal: RH BRAIN INJURY PATIENT EDUCATION Description Description: See Patient Education module for eduction specifics Outcome: Progressing Goal: Skin Care Protocol Initiated - if Braden Score 18 or less Description If consults are not indicated, leave blank or document N/A Outcome: Progressing   Problem: RH BOWEL ELIMINATION Goal: RH STG MANAGE BOWEL WITH ASSISTANCE Description STG Manage Bowel with mod I Assistance.  Outcome: Progressing Goal: RH STG MANAGE BOWEL W/MEDICATION W/ASSISTANCE Description STG Manage Bowel with Medication with mod I Assistance.  Outcome: Progressing   Problem: RH SKIN INTEGRITY Goal: RH STG SKIN FREE OF INFECTION/BREAKDOWN Description Patients skin will remain free from further infection or breakdown with min assist.  Outcome: Progressing Goal: RH STG MAINTAIN SKIN INTEGRITY WITH ASSISTANCE Description STG Maintain Skin Integrity With min Assistance.  Outcome: Progressing Goal: RH STG ABLE TO PERFORM INCISION/WOUND CARE W/ASSISTANCE Description STG Able To Perform Incision/Wound Care With min Assistance.  Outcome: Progressing   Problem: RH SAFETY Goal: RH STG ADHERE TO SAFETY PRECAUTIONS W/ASSISTANCE/DEVICE Description STG Adhere to Safety Precautions With supervision Assistance/Device.  Outcome: Progressing   Problem: RH PAIN MANAGEMENT Goal: RH STG PAIN MANAGED AT OR BELOW PT'S PAIN GOAL Description < 4- managed with prn medications and nonpharmacological interventions  Outcome: Progressing

## 2018-03-13 NOTE — Progress Notes (Signed)
Occupational Therapy Session Note  Patient Details  Name: Kara Ramirez MRN: [id number] Date of Birth: [birth year]  Today's Date: 03/13/2018 OT Individual Time: 1300-1415 OT Individual Time Calculation (min): 75 min    Short Term Goals: Week 1:  OT Short Term Goal 1 (Week 1): STG=LTG  Skilled Therapeutic Interventions/Progress Updates:    Upon entering the room, pt supine in bed with c/o "soreness all over" and fatigue. Pt agreeable to OT intervention. Pt standing from bed with supervision and ambulating with RW and steady assistance to [employer]. Pt obtaining needed clothing items and folding other and placing into dresser with steady assistance. Pt ambulating into bathroom and transferring onto TTB with steady assistance. She removed clothing from seated position with min verbal cues for safety awareness. Pt bathing from seated position and standing while holding grab bar to wash buttocks with close supervision. Pt donning clothing items from EOB with overall supervision as well. Pt returning to bed at end of session secondary to fatigue. Call bell and all needed items within reach. Bed alarm activated.   Therapy Documentation Precautions:  Precautions Precautions: Fall Restrictions Weight Bearing Restrictions: No Pain: Pain Assessment Pain Scale: 0-10 Pain Score: 2  Pain Type: Acute pain Pain Location: Hip Pain Orientation: Left Pain Descriptors / Indicators: Aching Pain Frequency: Intermittent Pain Onset: On-going Patients Stated Pain Goal: 0 Pain Intervention(s): Medication (See eMAR);Repositioned Multiple Pain Sites: No ADL: ADL ADL Comments: see functional navigator  See Function Navigator for Current Functional Status.   Therapy/Group: Individual Therapy  Gypsy Decant 03/13/2018, 3:39 PM

## 2018-03-13 NOTE — Progress Notes (Signed)
Physical Therapy Note  Patient Details  Name: Kara Ramirez MRN: [id number] Date of Birth: June 21, 1951 Today's Date: 03/13/2018    Time: 1105-1150 45 minutes  1:1 Pt c/o pain 4/10 in chest and hips, monitored during session and rests given as needed.  Pt performs gait x 150' with supervision with RW.  Standing balance without AD with min guard for ball toss, min A for ball kick without AD, supervision for ball kick with RW.  Standing therex for LE strengthening with frequent rest breaks due to fatigue at end of session.  Pt propels w/c with bilat LEs with supervision. Pt left in room with needs at hand, alarm on.   DONAWERTH,KAREN 03/13/2018, 11:42 AM

## 2018-03-14 ENCOUNTER — Inpatient Hospital Stay (HOSPITAL_COMMUNITY): Payer: PPO | Admitting: Speech Pathology

## 2018-03-14 ENCOUNTER — Inpatient Hospital Stay (HOSPITAL_COMMUNITY): Payer: PPO | Admitting: Physical Therapy

## 2018-03-14 ENCOUNTER — Inpatient Hospital Stay (HOSPITAL_COMMUNITY): Payer: PPO | Admitting: Occupational Therapy

## 2018-03-14 ENCOUNTER — Inpatient Hospital Stay (HOSPITAL_COMMUNITY): Payer: PPO

## 2018-03-14 NOTE — Progress Notes (Signed)
Occupational Therapy Session Note  Patient Details  Name: Kara Ramirez MRN: [id number] Date of Birth: 1951-06-18  Today's Date: 03/14/2018 OT Individual Time: 1335-1430 OT Individual Time Calculation (min): 55 min   Short Term Goals: Week 1:  OT Short Term Goal 1 (Week 1): STG=LTG  Skilled Therapeutic Interventions/Progress Updates:    Pt greeted seated on toilet having BM. Completed toileting tasks with supervision using RW. Pt verbalizing feeling pain and soreness in hips/legs. Once she washed hands at sink, pt ambulated to ortho gym with RW and supervision. Worked on pain mgt, balance, UE/LE flexibility, and diaphragmatic breathing during modified yoga. Guided pt through gentle stretches involving lateral body, back, UEs, shoulders, and hips. Pt relied on compensatory techniques for sit<supine and supine<sit due to back pain. However, pt reported stretching helped ease pain and that she wanted to do yoga during another session. At end of tx pt ambulated back to room in manner as written above. She was left EOB via OT handoff.   Therapy Documentation Precautions:  Precautions Precautions: Fall Restrictions Weight Bearing Restrictions: No Pain: Pain Assessment Pain Scale: 0-10 Pain Score: 4  Pain Type: Acute pain Pain Location: Leg Pain Orientation: Right;Left Pain Descriptors / Indicators: Aching Pain Frequency: Intermittent Pain Onset: On-going Patients Stated Pain Goal: 0 Pain Intervention(s): Medication (See eMAR) Multiple Pain Sites: No ADL: ADL ADL Comments: see functional navigator     See Function Navigator for Current Functional Status.   Therapy/Group: Individual Therapy  Jamiracle Avants A Cheyenne Bordeaux 03/14/2018, 3:41 PM

## 2018-03-14 NOTE — Plan of Care (Signed)
  Problem: Consults Goal: RH BRAIN INJURY PATIENT EDUCATION Description Description: See Patient Education module for eduction specifics Outcome: Progressing Goal: Skin Care Protocol Initiated - if Braden Score 18 or less Description If consults are not indicated, leave blank or document N/A Outcome: Progressing   Problem: RH BOWEL ELIMINATION Goal: RH STG MANAGE BOWEL WITH ASSISTANCE Description STG Manage Bowel with mod I Assistance.  Outcome: Progressing Goal: RH STG MANAGE BOWEL W/MEDICATION W/ASSISTANCE Description STG Manage Bowel with Medication with mod I Assistance.  Outcome: Progressing   Problem: RH SKIN INTEGRITY Goal: RH STG SKIN FREE OF INFECTION/BREAKDOWN Description Patients skin will remain free from further infection or breakdown with min assist.  Outcome: Progressing Goal: RH STG MAINTAIN SKIN INTEGRITY WITH ASSISTANCE Description STG Maintain Skin Integrity With min Assistance.  Outcome: Progressing Goal: RH STG ABLE TO PERFORM INCISION/WOUND CARE W/ASSISTANCE Description STG Able To Perform Incision/Wound Care With min Assistance.  Outcome: Progressing   Problem: RH SAFETY Goal: RH STG ADHERE TO SAFETY PRECAUTIONS W/ASSISTANCE/DEVICE Description STG Adhere to Safety Precautions With supervision Assistance/Device.  Outcome: Progressing   Problem: RH PAIN MANAGEMENT Goal: RH STG PAIN MANAGED AT OR BELOW PT'S PAIN GOAL Description < 4- managed with prn medications and nonpharmacological interventions  Outcome: Progressing

## 2018-03-14 NOTE — Progress Notes (Signed)
Speech Language Pathology Daily Session Note  Patient Details  Name: Kara Ramirez MRN: [id number] Date of Birth: [birth year]  Today's Date: 03/14/2018 SLP Individual Time: 1510-1535 SLP Individual Time Calculation (min): 25 min  Short Term Goals: Week 1: SLP Short Term Goal 1 (Week 1): Pt will utilize external memory aids to recall new daily information with supervision cues.  SLP Short Term Goal 2 (Week 1): Pt will complete complex problem solving tasks with supervision cues.  SLP Short Term Goal 3 (Week 1): Pt will alternate attention in moderately distracting environment with supervision cues.  SLP Short Term Goal 4 (Week 1): Pt will demonstrate anticipatory awareness by listing 3 activities that are safe to participate in and unsafe to perform with supervision cues.   Skilled Therapeutic Interventions:  Pt was seen for skilled ST targeting cognitive goals.  SLP facilitated the session with medication management tasks to address complex recall goals.  Pt could recall function of medications when named for 100% accuracy with mod I; however, meds were all known from prior to admission.  Discussed using a pill box for additional support once discharged home.  Pt was left in bed with husband at bedside.  Continue per current plan of care.    Function:  Eating Eating                 Cognition Comprehension Comprehension assist level: Follows complex conversation/direction with extra time/assistive device  Expression   Expression assist level: Expresses complex 90% of the time/cues < 10% of the time  Social Interaction Social Interaction assist level: Interacts appropriately with others with medication or extra time (anti-anxiety, antidepressant).  Problem Solving Problem solving assist level: Solves complex 90% of the time/cues < 10% of the time  Memory Memory assist level: Recognizes or recalls 90% of the time/requires cueing < 10% of the time    Pain Pain Assessment Pain  Scale: 0-10 Pain Score: 0-No pain  Therapy/Group: Individual Therapy  Jearldean Gutt, Selinda Orion 03/14/2018, 4:26 PM
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
## 2018-03-14 NOTE — Progress Notes (Signed)
Occupational Therapy Session Note  Patient Details  Name: Kara Ramirez MRN: [id number] Date of Birth: 1951/05/20  Today's Date: 03/14/2018 OT Individual Time: 1430-1458 OT Individual Time Calculation (min): 28 min    Short Term Goals: Week 1:  OT Short Term Goal 1 (Week 1): STG=LTG  Skilled Therapeutic Interventions/Progress Updates:    1;1. Pt completes stand pivot transfer into w/c from bed with no AD and supervision with VC for hand placement. Pt propels w/c part way to ADL apartment with min A for steering and [place] A to find wheels. Pt ambualtes in ADL kitchen to find hidden fruit in cabinets and appliances to practice reaching and walker positioning in prep for functional kitchen tasks. Exited session with pt seated in bed, call light in reach and all needs met.  Therapy Documentation Precautions:  Precautions Precautions: Fall Restrictions Weight Bearing Restrictions: No General:   Vital Signs:   Pain: Pain Assessment Pain Scale: 0-10 Pain Score: 4  Pain Type: Acute pain Pain Location: Leg Pain Orientation: Right;Left Pain Descriptors / Indicators: Aching Pain Frequency: Intermittent Pain Onset: On-going Patients Stated Pain Goal: 0 Pain Intervention(s): Medication (See eMAR) Multiple Pain Sites: No  See Function Navigator for Current Functional Status.   Therapy/Group: Individual Therapy  Tonny Branch 03/14/2018, 3:28 PM
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
## 2018-03-14 NOTE — Progress Notes (Signed)
Physical Therapy Note  Patient Details  Name: Kara Ramirez MRN: [id number] Date of Birth: 06-Jan-1951 Today's Date: 03/14/2018    Time: 1100-1155 55 minutes  1:1 Pt c/o bilat hip pain, RN made aware.  Pt performs gait 150' x 2 with RW and supervision, decreased cadence but no LOB.  Gait with obstacle negotiation and stepping over obstacles with RW and cues for technique, supervision/min guard.  Curb step negotiation with RW multiple attempts with supervision and cues for technique.  Stair negotiation 3'' steps with bilat handrails with supervision.  Pt with improved activity tolerance and balance. Attempted gait without RW, pt requires min A but with no LOB, c/o increased LE pain.    Arrick Dutton 03/14/2018, 12:29 PM

## 2018-03-15 ENCOUNTER — Inpatient Hospital Stay (HOSPITAL_COMMUNITY): Payer: PPO | Admitting: Physical Therapy

## 2018-03-15 ENCOUNTER — Inpatient Hospital Stay (HOSPITAL_COMMUNITY): Payer: PPO

## 2018-03-15 ENCOUNTER — Other Ambulatory Visit: Payer: Self-pay

## 2018-03-15 ENCOUNTER — Inpatient Hospital Stay (HOSPITAL_COMMUNITY): Payer: PPO | Admitting: Occupational Therapy

## 2018-03-15 NOTE — Progress Notes (Signed)
Occupational Therapy Session Note  Patient Details  Name: Kara Ramirez MRN: [id number] Date of Birth: [birth year]  Today's Date: 03/15/2018 OT Individual Time: 1302-1400 and 1505-1540 OT Individual Time Calculation (min): 58 min and 35 min  Short Term Goals: Week 1:  OT Short Term Goal 1 (Week 1): STG=LTG  Skilled Therapeutic Interventions/Progress Updates:    Pt greeted supine in bed. Reported having a headache but premedicated and declining aromatherapy interventions. Tx focus on balance, walker safety, cognitive remediation, and functional transfers during bathing, dressing, toileting, and grooming tasks. Pt completed all functional transfers with supervision assist using RW at ambulatory level. Min vcs for device placement during TTB and toilet transfers. Supervision sit<stand while dressing EOB using RW. Increased dynamic balance challenges while blow-drying hair at sink in standing. No LOBs. At end of session pt transferred to recliner and was left with all needs and chair alarm set.   2nd Session 1:1 tx (35 min) Pt greeted in recliner with visitors present. Still c/o headache with RN made aware. Tx focus on balance, cognitive remediation, and functional ambulation with device. She completed toilet transfer/toileting with supervision assist, initially tried to leave walker at threshold and back up into bathroom without it. Afterwards worked on pathfinding with pt ambulating down hallway, into dayroom, and back to room in circuitous route. She was able to find her room with min vcs. At end of session pt returned to bed and was left with all needs within reach and bed alarm set.   Therapy Documentation Precautions:  Precautions Precautions: Fall Restrictions Weight Bearing Restrictions: No Pain: Pain Assessment Pain Scale: 0-10 Pain Score: 7  Pain Type: Acute pain Pain Location: Head Pain Descriptors / Indicators: Aching Pain Frequency: Intermittent Pain Onset: On-going Pain  Intervention(s): Medication (See eMAR) ADL: ADL ADL Comments: see functional navigator     See Function Navigator for Current Functional Status.   Therapy/Group: Individual Therapy  Kara Ramirez 03/15/2018, 4:17 PM

## 2018-03-15 NOTE — Progress Notes (Signed)
Physical Therapy Session Note  Patient Details  Name: Kara Ramirez MRN: [id number] Date of Birth: 14-Aug-1951  Today's Date: 03/15/2018 PT Individual Time: 0800-0915 PT Individual Time Calculation (min): 75 min   Short Term Goals: Week 1:  PT Short Term Goal 1 (Week 1): = LTGs due to ELOS  Skilled Therapeutic Interventions/Progress Updates:   Pt in supine and agreeable to therapy, denies pain at rest however pain in L hip 8/10 w/ activity. RN made aware that pt was requesting pain medication. Session focused on endurance and overall strengthening w/ functional activity. Transferred to EOB and donned socks and shoes w/ set-up assist only and increased time. Ambulated to/from day room w/ increased time using RW and close supervision. Performed NuStep 10 min at level 1. Pt able to maintain conversation during activity w/ moderate increase in work of breathing, verbal cues for breathing pattern. Performed LE strengthening exercises in standing including partial knee bends 2x10, heel raises 1x10, and toe raises 1x10. Additionally performed gentle active ROM of UEs w/ 1# dowel rod including arm flexion 2x5 and shoulder press 2x5. Returned to room and provided set-up assist w/ toileting and hand hygiene. Ended session in supine, call bell within reach and all needs met. Chair alarm and quick release belt donned.   Therapy Documentation Precautions:  Precautions Precautions: Fall Restrictions Weight Bearing Restrictions: No Vital Signs: Oxygen Therapy SpO2: 96 % O2 Device: Room Air Pain: Pain Assessment Pain Scale: 0-10 Pain Score: 0-No pain Pain Type: Acute pain Pain Location: Back Pain Orientation: Left Pain Descriptors / Indicators: Aching Pain Onset: On-going Pain Intervention(s): Medication (See eMAR)  See Function Navigator for Current Functional Status.   Therapy/Group: Individual Therapy  Cindia Hustead K Arnette 03/15/2018, 11:00 AM

## 2018-03-15 NOTE — Progress Notes (Signed)
Kara Ramirez PHYSICAL MEDICINE & REHABILITATION     PROGRESS NOTE    Subjective/Complaints: Feels that ice helps her chest pain quite a bit.  Had a good night. ROS: Patient denies fever, rash, sore throat, blurred vision, nausea, vomiting, diarrhea, cough, shortness of breath or chest pain, joint or back pain, headache, or mood change.    Objective: Vital Signs: Blood pressure 112/66, pulse 82, temperature 98.8 F (37.1 C), temperature source Oral, resp. rate 17, weight 70.1 kg (154 lb 8.7 oz), SpO2 96 %. No results found. No results for input(s): WBC, HGB, HCT, PLT in the last 72 hours. No results for input(s): NA, K, CL, GLUCOSE, BUN, CREATININE, CALCIUM in the last 72 hours.  Invalid input(s): CO CBG (last 3)  No results for input(s): GLUCAP in the last 72 hours.  Wt Readings from Last 3 Encounters:  03/12/18 70.1 kg (154 lb 8.7 oz)  03/06/18 70.2 kg (154 lb 12.2 oz)  02/17/18 71.2 kg (157 lb)    Physical Exam:  Constitutional: No distress . Vital signs reviewed. HEENT: EOMI, oral membranes moist Neck: supple Cardiovascular: RRR without murmur. No JVD    Respiratory: CTA Bilaterally without wheezes or rales. Normal effort    GI: BS +, non-tender, non-distended   Musculoskeletal: She exhibits noedemaor tenderness. Sternum/chest slightly tly tender with palpation Neurological: She isalertand oriented to person, place, and time.   Improved processing follows simple commands. good insight and awareness. Normal memory.  Motor 5/5 UE's bilaterally. LE's 4/5 prox to distal. Skin: Few scattered bruises as noted above Psychiatric: Normal affect, cooperative and pleasant   Assessment/Plan: 1.  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 Physiatrist is providing close team supervision and 24 hour management of active medical problems listed  below. Physiatrist and rehab team continue to assess barriers to discharge/monitor patient progress toward functional and medical goals.  Function:  Bathing Bathing position   Position: Shower  Bathing parts Body parts bathed by patient: Right arm, Left arm, Chest, Abdomen, Front perineal area, Buttocks, Right upper leg, Left upper leg, Right lower leg, Left lower leg Body parts bathed by helper: Back  Bathing assist Assist Level: Supervision or verbal cues      Upper Body Dressing/Undressing Upper body dressing   What is the patient wearing?: Pull over shirt/dress     Pull over shirt/dress - Perfomed by patient: Thread/unthread right sleeve, Thread/unthread left sleeve, Put head through opening, Pull shirt over trunk          Upper body assist Assist Level: Supervision or verbal cues, Set up   Set up : To obtain clothing/put away  Lower Body Dressing/Undressing Lower body dressing   What is the patient wearing?: Underwear, Pants, Socks, Shoes Underwear - Performed by patient: Thread/unthread right underwear leg, Thread/unthread left underwear leg, Pull underwear up/down   Pants- Performed by patient: Thread/unthread right pants leg, Thread/unthread left pants leg, Pull pants up/down     Non-skid slipper socks- Performed by helper: Don/doff left sock   Socks - Performed by helper: Don/doff right sock, Don/doff left sock   Shoes - Performed by helper: Don/doff right shoe, Don/doff left shoe          Lower body assist Assist for lower body dressing: Set up, Supervision or verbal cues   Set up : To obtain clothing/put away  Toileting Toileting   Toileting steps completed by patient: Adjust clothing  prior to toileting, Performs perineal hygiene, Adjust clothing after toileting   Toileting Assistive Devices: Grab bar or rail  Toileting assist Assist level: Supervision or verbal cues   Transfers Chair/bed transfer   Chair/bed transfer method: Stand pivot,  Ambulatory Chair/bed transfer assist level: Supervision or verbal cues Chair/bed transfer assistive device: Armrests, Medical sales representative     Max distance: 150; Assist level: Supervision or verbal cues   Wheelchair   Type: Manual Max wheelchair distance: 20 Assist Level: Touching or steadying assistance (Pt > 75%)  Cognition Comprehension Comprehension assist level: Follows complex conversation/direction with extra time/assistive device  Expression Expression assist level: Expresses complex 90% of the time/cues < 10% of the time  Social Interaction Social Interaction assist level: Interacts appropriately with others with medication or extra time (anti-anxiety, antidepressant).  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  PT, OT, SLP 2. DVT Prophylaxis/Anticoagulation: Pharmaceutical:Lovenox 3.Chronic HA/Fibromyalgia/Pain Management:Used Naprosyn prn at home.Robaxin or tylenol prn, ice/local care to chest wall   -Continue scheduled ice for chest -Continue conservative measures for pain   -No NSAIDs  given SAH 4. Mood:LCSW to follow for evaluation and support. 5. Neuropsych: This patientiscapable of making decisions on herown behalf. 6. Skin/Wound Care:Routine pressure relief measures. 7. Fluids/Electrolytes/Nutrition:Intake good so far 8. ABLA:  .  Hemoglobin up to 11.7 on 4/24 9. Hypokalemia: supplemented. K+ 4.3 on 4/24  10. PAF: Monitor HR bid. Continue metoprolol bid.  Eliquis on hold due to recent TBI.  11. Bilateral atelectasis: encourage IS with flutter valve.  12. Asthma: Continue budesonide nebs bid. No apparent issues at  present   LOS (Days) [place] EVALUATION WAS PERFORMED  Kara Staggers, MD 03/15/2018 9:50 AM

## 2018-03-15 NOTE — Progress Notes (Signed)
Speech Language Pathology Daily Session Note  Patient Details  Name: Kara Ramirez MRN: [id number] Date of Birth: [birth year]  Today's Date: 03/15/2018 SLP Individual Time: 1000-1030 SLP Individual Time Calculation (min): 30 min  Short Term Goals: Week 1: SLP Short Term Goal 1 (Week 1): Pt will utilize external memory aids to recall new daily information with supervision cues.  SLP Short Term Goal 2 (Week 1): Pt will complete complex problem solving tasks with supervision cues.  SLP Short Term Goal 3 (Week 1): Pt will alternate attention in moderately distracting environment with supervision cues.  SLP Short Term Goal 4 (Week 1): Pt will demonstrate anticipatory awareness by listing 3 activities that are safe to participate in and unsafe to perform with supervision cues.   Skilled Therapeutic Interventions: Pt was seen for skilled ST targeting cognitive goals.  SLP facilitated the session with deductive reasoning task to address complex executive functioning/problem-solving goals. Pt completed task with supervision/set-up A for the first question and then Mod I for the remainder of the activity. Pt was left in bed with call bell within reach. Continue per current plan of care.    Function:  Eating Eating   Modified Consistency Diet: No Eating Assist Level: More than reasonable amount of time           Cognition Comprehension Comprehension assist level: Follows complex conversation/direction with extra time/assistive device  Expression   Expression assist level: Expresses complex 90% of the time/cues < 10% of the time  Social Interaction Social Interaction assist level: Interacts appropriately with others with medication or extra time (anti-anxiety, antidepressant).  Problem Solving Problem solving assist level: Solves complex 90% of the time/cues < 10% of the time  Memory Memory assist level: Recognizes or recalls 90% of the time/requires cueing < 10% of the time    Pain Pain  Assessment Pain Scale: 0-10 Pain Score: 5  Pain Type: Acute pain Pain Location: Chest Pain Intervention(s): RN aware  Therapy/Group: Individual Therapy  Nathon Stefanski A Zykera Abella 03/15/2018, 12:25 PM

## 2018-03-16 ENCOUNTER — Inpatient Hospital Stay (HOSPITAL_COMMUNITY): Payer: PPO

## 2018-03-16 ENCOUNTER — Encounter (HOSPITAL_COMMUNITY): Payer: PPO | Admitting: Occupational Therapy

## 2018-03-16 NOTE — Progress Notes (Signed)
[place] PHYSICAL MEDICINE & REHABILITATION     PROGRESS NOTE    Subjective/Complaints: Overall feeling fairly well.  Ice continues to help with her hip comfort.  ROS: Patient denies fever, rash, sore throat, blurred vision, nausea, vomiting, diarrhea, cough, shortness of breath or chest pain, joint or back pain, headache, or mood change.    Objective: Vital Signs: Blood pressure 98/70, pulse 92, temperature 98.4 F (36.9 C), temperature source Oral, resp. rate 16, height 5\' 7"  (1.702 m), weight 70.1 kg (154 lb 8.7 oz), SpO2 96 %. No results found. No results for input(s): WBC, HGB, HCT, PLT in the last 72 hours. No results for input(s): NA, K, CL, GLUCOSE, BUN, CREATININE, CALCIUM in the last 72 hours.  Invalid input(s): CO CBG (last 3)  No results for input(s): GLUCAP in the last 72 hours.  Wt Readings from Last 3 Encounters:  03/12/18 70.1 kg (154 lb 8.7 oz)  03/06/18 70.2 kg (154 lb 12.2 oz)  02/17/18 71.2 kg (157 lb)    Physical Exam:  Constitutional: No distress . Vital signs reviewed. HEENT: EOMI, oral membranes moist Neck: supple Cardiovascular: RRR without murmur. No JVD    Respiratory: CTA Bilaterally without wheezes or rales. Normal effort    GI: BS +, non-tender, non-distended    Musculoskeletal: She exhibits noedemaor tenderness. Sternum/chest slightly tly tender with palpation Neurological: She isalertand oriented to person, place, and time.   Improved processing follows simple commands. good insight and awareness. Normal memory.  Motor 5/5 UE's bilaterally. LE's 4/5 prox to distal. Skin: Few scattered bruises as noted above Psychiatric: Normal affect, cooperative and pleasant   Assessment/Plan: 1.  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 Physiatrist is providing close team supervision and 24 hour management of  active medical problems listed below. Physiatrist and rehab team continue to assess barriers to discharge/monitor patient progress toward functional and medical goals.  Function:  Bathing Bathing position   Position: Shower  Bathing parts Body parts bathed by patient: Right arm, Left arm, Chest, Abdomen, Front perineal area, Buttocks, Right upper leg, Left upper leg, Right lower leg, Left lower leg, Back Body parts bathed by helper: Back  Bathing assist Assist Level: Supervision or verbal cues      Upper Body Dressing/Undressing Upper body dressing   What is the patient wearing?: Pull over shirt/dress     Pull over shirt/dress - Perfomed by patient: Thread/unthread right sleeve, Thread/unthread left sleeve, Put head through opening, Pull shirt over trunk          Upper body assist Assist Level: Supervision or verbal cues, Set up   Set up : To obtain clothing/put away  Lower Body Dressing/Undressing Lower body dressing   What is the patient wearing?: Underwear, Pants, Socks, Shoes Underwear - Performed by patient: Thread/unthread right underwear leg, Thread/unthread left underwear leg, Pull underwear up/down   Pants- Performed by patient: Thread/unthread right pants leg, Thread/unthread left pants leg, Pull pants up/down     Non-skid slipper socks- Performed by helper: Don/doff left sock Socks - Performed by patient: Don/doff right sock, Don/doff left sock Socks - Performed by helper: Don/doff right sock, Don/doff left sock Shoes - Performed by patient: Don/doff right shoe, Don/doff left shoe, Fasten right, Fasten left Shoes - Performed by helper: Don/doff right shoe, Don/doff left shoe          Lower body assist Assist for lower body  dressing: Supervision or verbal cues   Set up : To obtain clothing/put away  Toileting Toileting   Toileting steps completed by patient: Adjust clothing prior to toileting, Performs perineal hygiene, Adjust clothing after toileting    Toileting Assistive Devices: Grab bar or rail  Toileting assist Assist level: Supervision or verbal cues   Transfers Chair/bed transfer   Chair/bed transfer method: Stand pivot, Ambulatory Chair/bed transfer assist level: Supervision or verbal cues Chair/bed transfer assistive device: Armrests, Medical sales representative     Max distance: 150; Assist level: Supervision or verbal cues   Wheelchair   Type: Manual Max wheelchair distance: 20 Assist Level: Touching or steadying assistance (Pt > 75%)  Cognition Comprehension Comprehension assist level: Follows complex conversation/direction with extra time/assistive device  Expression Expression assist level: Expresses complex 90% of the time/cues < 10% of the time  Social Interaction Social Interaction assist level: Interacts appropriately with others with medication or extra time (anti-anxiety, antidepressant).  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  PT, OT, SLP 2. DVT Prophylaxis/Anticoagulation: Pharmaceutical:Lovenox 3.Chronic HA/Fibromyalgia/Pain Management:Used Naprosyn prn at home.Robaxin or tylenol prn, ice/local care to chest wall   -Continue scheduled ice for chest -Continue conservative measures for pain   -No NSAIDs  given SAH 4. Mood:LCSW to follow for evaluation and support. 5. Neuropsych: This patientiscapable of making decisions on herown behalf. 6. Skin/Wound Care:Routine pressure relief measures. 7. Fluids/Electrolytes/Nutrition:Intake good so far 8. ABLA:  .  Hemoglobin up to 11.7 on 4/24 9. Hypokalemia: supplemented. K+ 4.3 on 4/24  10. PAF: Monitor HR bid. Continue  metoprolol bid.  Eliquis on hold due to recent TBI.  11. Bilateral atelectasis: encourage IS with flutter valve.  12. Asthma: Continue budesonide nebs bid. No apparent issues at present   LOS (Days) [place] EVALUATION WAS PERFORMED  Meredith Staggers, MD 03/16/2018 10:36 AM

## 2018-03-16 NOTE — Progress Notes (Signed)
Occupational Therapy Session Note  Patient Details  Name: Kara Ramirez MRN: [id number] Date of Birth: 1951/09/03  Today's Date: 03/16/2018 OT Group Time: 1100-1200 OT Group Time Calculation (min): 60 min  Short Term Goals: Week 1:  OT Short Term Goal 1 (Week 1): STG=LTG  Skilled Therapeutic Interventions/Progress Updates:    Pt participated in therapeutic dance group with focus on dynamic standing balance, activity tolerance, and social participation. She stood at table without UE support, shaking hips, clapping, and swaying while holding hands with other participants. She took rest breaks as needed, and continued these dance moves while seated. Pt  requested songs, conversed and danced with other participants. During one stand, pt kicked out each leg interchangeably with unilateral UE support on table to challenge balance. At end of session she was escorted back to room in w/c with RT.     Therapy Documentation Precautions:  Precautions Precautions: Fall Restrictions Weight Bearing Restrictions: No Vital Signs: Oxygen Therapy SpO2: 96 % O2 Device: Room Air Pain: Pain Assessment Pain Score: 0-No pain ADL: ADL ADL Comments: see functional navigator     See Function Navigator for Current Functional Status.   Therapy/Group: Group Therapy  Senetra Dillin A Hakeen Shipes 03/16/2018, 12:56 PM

## 2018-03-16 NOTE — Progress Notes (Signed)
Occupational Therapy Session Note  Patient Details  Name: Kara Ramirez MRN: [id number] Date of Birth: 06-24-1951  Today's Date: 03/16/2018 OT Individual Time: 3790-2409 OT Individual Time Calculation (min): 31 min    Short Term Goals: Week 1:  OT Short Term Goal 1 (Week 1): STG=LTG  Skilled Therapeutic Interventions/Progress Updates:    1:1. Pt agreeable to going outside to "see the sunshine." Pt ambulates with supervision with RW to outside patio for practice for community mobility distances/uneven surfaces with VC for not putting as much pressure through walker with UE. Pt sits on park bench to complete 2x30 passes of volley ball (chest, bounce and overhead pass) with supervision for even BUE coordination/calibration. Exited session with pt seated in bed, exit alarm on and needs in reach  Therapy Documentation Precautions:  Precautions Precautions: Fall Restrictions Weight Bearing Restrictions: No General:   Vital Signs: Therapy Vitals Temp: 98.4 F (36.9 C) Temp Source: Oral Pulse Rate: 91 Resp: 16 BP: 101/80 Patient Position (if appropriate): Sitting Oxygen Therapy SpO2: 96 % O2 Device: Room Air \ See Function Navigator for Current Functional Status.   Therapy/Group: Individual Therapy  Tonny Branch 03/16/2018, 4:54 PM

## 2018-03-17 ENCOUNTER — Inpatient Hospital Stay (HOSPITAL_COMMUNITY): Payer: PPO | Admitting: Occupational Therapy

## 2018-03-17 ENCOUNTER — Inpatient Hospital Stay (HOSPITAL_COMMUNITY): Payer: PPO

## 2018-03-17 ENCOUNTER — Inpatient Hospital Stay (HOSPITAL_COMMUNITY): Payer: PPO | Admitting: Physical Therapy

## 2018-03-17 NOTE — Progress Notes (Signed)
Speech Language Pathology Discharge Summary  Patient Details  Name: Kara Ramirez MRN: [id number] Date of Birth: [birth year]  Today's Date: 03/17/2018 SLP Individual Time: 1115-1200 SLP Individual Time Calculation (min): 45 min   Skilled Therapeutic Interventions: Skilled ST services focused on cognitive skills and education. SLP facilitated semi-complex problem solving for deductive reasoning requiring supervision A verbal cues for semi-complex problem solving , alternating attention and recall of novel information. SLP educated pt in strategies to aid with slowed progress and to compensate for continued visual deficits, pt stated understanding. Pt agreed that continuing Skilled ST services would be beneficial in order to reach Mod I. Pt was left in room with call bell within reach.    Patient has met 1 of 1 long term goals.  Patient to discharge at overall Supervision level.  Reasons goals not met:     Clinical Impression/Discharge Summary:   Pt demonstrated great progress meeting 1 out 1 long term goals and 4 out 4 short term goals, discharging at supervision A. Pt demonstrated improvement in higher level problem solving, attention, recall strategies and anticipatory awareness, however would continue to benefit from skilled ST services in order to reduce burden of care and maximize functional independence.   Care Partner:  Caregiver Able to Provide Assistance: Yes  Type of Caregiver Assistance: Cognitive  Recommendation:  Outpatient SLP;Home Health SLP;24 hour supervision/assistance  Rationale for SLP Follow Up: Maximize cognitive function and independence;Reduce caregiver burden   Equipment:     Reasons for discharge: Discharged from hospital   Patient/Family Agrees with Progress Made and Goals Achieved: Yes   Function:  Eating Eating                 Cognition Comprehension Comprehension assist level: Follows complex conversation/direction with extra time/assistive  device  Expression   Expression assist level: Expresses complex 90% of the time/cues < 10% of the time  Social Interaction Social Interaction assist level: Interacts appropriately with others with medication or extra time (anti-anxiety, antidepressant).  Problem Solving Problem solving assist level: Solves complex 90% of the time/cues < 10% of the time  Memory Memory assist level: Recognizes or recalls 90% of the time/requires cueing < 10% of the time   [name]  [facility] 03/17/2018, 12:27 PM

## 2018-03-17 NOTE — Progress Notes (Signed)
[facility] PHYSICAL MEDICINE & REHABILITATION     PROGRESS NOTE    Subjective/Complaints: Continues to improve. Chest a littler sore. Notes that vision has not returned to baseline and called optometrist to reassess her eyes  ROS: Patient denies fever, rash, sore throat,   nausea, vomiting, diarrhea, cough, shortness of breath or chest pain, joint or back pain, headache, or mood change.     Objective: Vital Signs: Blood pressure (!) 103/54, pulse 100, temperature 98.7 F (37.1 C), temperature source Oral, resp. rate 18, height 5\' 7"  (1.702 m), weight 70.1 kg (154 lb 8.7 oz), SpO2 100 %. No results found. No results for input(s): WBC, HGB, HCT, PLT in the last 72 hours. No results for input(s): NA, K, CL, GLUCOSE, BUN, CREATININE, CALCIUM in the last 72 hours.  Invalid input(s): CO CBG (last 3)  No results for input(s): GLUCAP in the last 72 hours.  Wt Readings from Last 3 Encounters:  03/12/18 70.1 kg (154 lb 8.7 oz)  03/06/18 70.2 kg (154 lb 12.2 oz)  02/17/18 71.2 kg (157 lb)    Physical Exam:  Constitutional: No distress . Vital signs reviewed. HEENT: EOMI, oral membranes moist Neck: supple Cardiovascular: RRR without murmur. No JVD    Respiratory: CTA Bilaterally without wheezes or rales. Normal effort. GI: BS +, non-tender, non-distended   Musculoskeletal: She exhibits noedemaor tenderness. Sternum/chest minimally tender with palpation Neurological: She isalertand oriented to person, place, and time.   Improved processing follows simple commands. good insight and awareness. Normal memory.  Motor 5/5 UE's bilaterally. LE's 4/5 prox to distal. Skin: Few scattered bruises as noted above Psychiatric: Normal affect, cooperative and pleasant   Assessment/Plan: 1.  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 Physiatrist is providing  close team supervision and 24 hour management of active medical problems listed below. Physiatrist and rehab team continue to assess barriers to discharge/monitor patient progress toward functional and medical goals.  Function:  Bathing Bathing position   Position: Shower  Bathing parts Body parts bathed by patient: Right arm, Left arm, Chest, Abdomen, Front perineal area, Buttocks, Right upper leg, Left upper leg, Right lower leg, Left lower leg, Back Body parts bathed by helper: Back  Bathing assist Assist Level: Supervision or verbal cues      Upper Body Dressing/Undressing Upper body dressing   What is the patient wearing?: Pull over shirt/dress     Pull over shirt/dress - Perfomed by patient: Thread/unthread right sleeve, Thread/unthread left sleeve, Put head through opening, Pull shirt over trunk          Upper body assist Assist Level: Supervision or verbal cues, Set up   Set up : To obtain clothing/put away  Lower Body Dressing/Undressing Lower body dressing   What is the patient wearing?: Underwear, Pants, Socks, Shoes Underwear - Performed by patient: Thread/unthread right underwear leg, Thread/unthread left underwear leg, Pull underwear up/down   Pants- Performed by patient: Thread/unthread right pants leg, Thread/unthread left pants leg, Pull pants up/down     Non-skid slipper socks- Performed by helper: Don/doff left sock Socks - Performed by patient: Don/doff right sock, Don/doff left sock Socks - Performed by helper: Don/doff right sock, Don/doff left sock Shoes - Performed by patient: Don/doff right shoe, Don/doff left shoe, Fasten right, Fasten left Shoes - Performed by helper: Don/doff right shoe, Don/doff left shoe          Lower  body assist Assist for lower body dressing: Supervision or verbal cues   Set up : To obtain clothing/put away  Toileting Toileting   Toileting steps completed by patient: Adjust clothing prior to toileting, Performs perineal  hygiene, Adjust clothing after toileting   Toileting Assistive Devices: Grab bar or rail  Toileting assist Assist level: Supervision or verbal cues   Transfers Chair/bed transfer   Chair/bed transfer method: Stand pivot, Ambulatory Chair/bed transfer assist level: Supervision or verbal cues Chair/bed transfer assistive device: Armrests, Medical sales representative     Max distance: 150; Assist level: Supervision or verbal cues   Wheelchair   Type: Manual Max wheelchair distance: 20 Assist Level: Touching or steadying assistance (Pt > 75%)  Cognition Comprehension Comprehension assist level: Follows complex conversation/direction with extra time/assistive device  Expression Expression assist level: Expresses complex 90% of the time/cues < 10% of the time  Social Interaction Social Interaction assist level: Interacts appropriately with others with medication or extra time (anti-anxiety, antidepressant).  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  PT, OT, SLP   -discussed vision and that it may take 2-3 more months for vision to "recover' from TBI. I would not rush to make prescription changes, etc 2. DVT Prophylaxis/Anticoagulation: Pharmaceutical:Lovenox 3.Chronic HA/Fibromyalgia/Pain Management:Used Naprosyn prn at home.Robaxin or tylenol prn, ice/local care to chest wall   -Continue scheduled ice for chest -pain improving 4. Mood:LCSW to follow for evaluation and support. 5. Neuropsych: This patientiscapable of making decisions on herown behalf. 6. Skin/Wound Care:Routine pressure relief measures. 7. Fluids/Electrolytes/Nutrition:Intake good so  far 8. ABLA:  .  Hemoglobin up to 11.7 on 4/24   -recheck labs tomorrow 9. Hypokalemia: supplemented. K+ 4.3 on 4/24    -recheck labs tomorrow 10. PAF: HR a little elevated.  Continue metoprolol bid.  Eliquis on hold due to recent TBI.   11. Bilateral atelectasis: encourage IS with flutter valve.  12. Asthma: Continue budesonide nebs bid. No apparent issues at present   LOS (Days) [place] EVALUATION WAS PERFORMED  Meredith Staggers, MD 03/17/2018 9:12 AM

## 2018-03-17 NOTE — Progress Notes (Signed)
Occupational Therapy Discharge Summary  Patient Details  Name: Kara Ramirez MRN: [id number] Date of Birth: 09-Dec-1950  Today's Date: 03/17/2018 OT Individual Time: 6580-0634 OT Individual Time Calculation (min): 75 min    Patient has met 9 of 9 long term goals due to improved activity tolerance, improved balance, postural control and ability to compensate for deficits.  Patient to discharge at overall Modified Independent level.  Patient's care partner is independent to provide the necessary  assistance at discharge.    Reasons goals not met: all goals met  Recommendation:  No follow up OT intervention needed after discharge.  Equipment: No equipment provided  Reasons for discharge: treatment goals met  Patient/family agrees with progress made and goals achieved: Yes   OT Intervention: Upon entering the room, pt supine in bed and husband present in room. Pt ambulating in room with RW at mod I level to obtain all needed items for bathing and dressing this session. Pt bathing at shower level with sit <>stand at mod I level. Pt dressing from EOB with increased time. Pt standing at sink for grooming tasks at mod I level as well. OT making pt mod I level in room. Pt with no further questions at this time. RN notified.   OT Discharge Precautions/Restrictions  Precautions Precautions: Fall Restrictions Weight Bearing Restrictions: No Vital Signs Therapy Vitals Temp: 98.5 F (36.9 C) Temp Source: Oral Pulse Rate: 89 BP: 102/69 Patient Position (if appropriate): Sitting Oxygen Therapy SpO2: 98 % Pain Pain Assessment Pain Scale: 0-10 Pain Score: 2  Faces Pain Scale: Hurts little more Pain Type: Acute pain Pain Location: Chest Pain Orientation: Anterior Pain Descriptors / Indicators: Tender;Sore Pain Frequency: Intermittent Pain Onset: On-going Patients Stated Pain Goal: 3 Pain Intervention(s): Repositioned Multiple Pain Sites: No ADL ADL ADL Comments: see functional  navigator Cognition Overall Cognitive Status: Impaired/Different from baseline Arousal/Alertness: Awake/alert Orientation Level: Oriented X4 Attention: Alternating;Selective Focused Attention: Appears intact Sustained Attention: Appears intact Selective Attention: Appears intact Alternating Attention: Impaired Alternating Attention Impairment: Verbal basic;Functional basic Memory: Impaired Memory Impairment: Decreased recall of new information Awareness: Impaired Awareness Impairment: Anticipatory impairment Problem Solving: Impaired Problem Solving Impairment: Verbal complex;Functional complex Executive Function: Reasoning;Organizing Reasoning: Impaired Reasoning Impairment: Verbal complex;Functional complex Sequencing: Impaired Sequencing Impairment: Verbal complex;Functional complex Safety/Judgment: Appears intact Sensation Sensation Light Touch: Appears Intact Proprioception: Appears Intact Coordination Gross Motor Movements are Fluid and Coordinated: Yes Fine Motor Movements are Fluid and Coordinated: Yes Motor  Motor Motor - Discharge Observations: generalized weakness Mobility  Transfers Transfers: Sit to Stand;Stand to Sit Sit to Stand: 6: Modified independent (Device/Increase time) Stand to Sit: 6: Modified independent (Device/Increase time)  Trunk/Postural Assessment  Cervical Assessment Cervical Assessment: Within Functional Limits Thoracic Assessment Thoracic Assessment: Within Functional Limits Postural Control Protective Responses: delayed due to pain  Balance Balance Balance Assessed: Yes Static Standing Balance Static Standing - Balance Support: During functional activity Static Standing - Level of Assistance: 6: Modified independent (Device/Increase time) Dynamic Standing Balance Dynamic Standing - Balance Support: During functional activity Dynamic Standing - Level of Assistance: 6: Modified independent (Device/Increase time) Extremity/Trunk  Assessment RUE Assessment RUE Assessment: Within Functional Limits LUE Assessment LUE Assessment: Within Functional Limits   See Function Navigator for Current Functional Status.  Darleen Crocker P 03/17/2018, 5:00 PM

## 2018-03-17 NOTE — Progress Notes (Signed)
Physical Therapy Discharge Summary  Patient Details  Name: Kara Ramirez MRN: [id number] Date of Birth: May 23, 1951  Today's Date: 03/17/2018 PT Individual Time: 1015-1110 PT Individual Time Calculation (min): 55 min   Pt performs gait throughout unit with RW and mod I, slow cadence.  Pt performs stair negotiation and curb step negotiation with supervision.  Ramp and uneven surfaces with RW and supervision.  Furniture transfers and simulated car transfer with supervision.  Pt educated on safety for home, recommendations for HHPT and use of RW at all times, pt verbalizes understanding.  Pt pleased with planned d/c home tomorrow.  Patient has met 11 of 11 long term goals due to improved activity tolerance, improved balance, improved postural control, increased strength, decreased pain and ability to compensate for deficits.  Patient to discharge at an ambulatory level Modified Independent.     Reasons goals not met: n/a  Recommendation:  Patient will benefit from ongoing skilled PT services in home health setting to continue to advance safe functional mobility, address ongoing impairments in strength, gait, balance, and minimize fall risk.  Equipment: No equipment provided  Reasons for discharge: treatment goals met and discharge from hospital  Patient/family agrees with progress made and goals achieved: Yes  PT Discharge Precautions/Restrictions Precautions Precautions: Fall Restrictions Weight Bearing Restrictions: No Pain Pain Assessment Faces Pain Scale: Hurts little more Pain Location: Back Pain Orientation: Left;Mid;Lower Pain Descriptors / Indicators: Aching Pain Onset: On-going Pain Intervention(s): Repositioned;Ambulation/increased activity;RN made aware  Cognition Overall Cognitive Status: Impaired/Different from baseline Arousal/Alertness: Awake/alert Orientation Level: Oriented X4 Memory: Impaired Awareness Impairment: Anticipatory  impairment Sensation Sensation Light Touch: Appears Intact Proprioception: Appears Intact Coordination Gross Motor Movements are Fluid and Coordinated: Yes Fine Motor Movements are Fluid and Coordinated: Yes Motor  Motor Motor - Discharge Observations: generalized weakness   Trunk/Postural Assessment  Cervical Assessment Cervical Assessment: Within Functional Limits Thoracic Assessment Thoracic Assessment: Within Functional Limits Lumbar Assessment Lumbar Assessment: (posteior tilt, limited by pain) Postural Control Protective Responses: delayed due to pain  Balance Static Standing Balance Static Standing - Balance Support: During functional activity Static Standing - Level of Assistance: 6: Modified independent (Device/Increase time) Dynamic Standing Balance Dynamic Standing - Balance Support: During functional activity Dynamic Standing - Level of Assistance: 6: Modified independent (Device/Increase time) Extremity Assessment      RLE Strength RLE Overall Strength Comments: grossly 4+/5 LLE Strength LLE Overall Strength Comments: grossly 4/5   See Function Navigator for Current Functional Status.  Kara Ramirez 03/17/2018, 11:11 AM

## 2018-03-17 NOTE — Progress Notes (Signed)
Occupational Therapy Session Note  Patient Details  Name: EMYAH ROZNOWSKI MRN: [id number] Date of Birth: 18-Sep-1951  Today's Date: 03/17/2018 OT Individual Time: 1331-1415 OT Individual Time Calculation (min): 44 min   Short Term Goals: Week 1:  OT Short Term Goal 1 (Week 1): STG=LTG  Skilled Therapeutic Interventions/Progress Updates:    Pt greeted transferring out of bathroom using RW at Mod I level. Amenable to tx. She ambulated with RW to therapy apartment. While in kitchen, we practiced using RW for simulated meal prep tasks, using walker bag as appropriate for item transport. Pt reports she will mostly need to side-step in home kitchen, so we emphasized side-stepping with device at counter during session. Discussed energy conservation strategies, DME safety, and environmental modifications to implement for maximizing safety. Afterwards she ambulated back to room in manner as written above. Left her with all needs within reach.   Therapy Documentation Precautions:  Precautions Precautions: Fall Restrictions Weight Bearing Restrictions: No  Pain Assessment Pain Scale: 0-10 Pain Score: 2  Pain Type: Acute pain Pain Location: Chest Pain Orientation: Right;Left;Anterior Pain Descriptors / Indicators: Sore Pain Frequency: Intermittent Pain Onset: On-going Patients Stated Pain Goal: 3 Pain Intervention(s): Medication (See eMAR) Multiple Pain Sites: No ADL: ADL ADL Comments: see functional navigator     See Function Navigator for Current Functional Status.   Therapy/Group: Individual Therapy  Lamari Beckles A Jo-Ann Johanning 03/17/2018, 3:54 PM

## 2018-03-17 NOTE — Plan of Care (Signed)
9/9 LTGs achieved 03/17/18

## 2018-03-18 DIAGNOSIS — H01009 Unspecified blepharitis unspecified eye, unspecified eyelid: Secondary | ICD-10-CM | POA: Diagnosis not present

## 2018-03-18 DIAGNOSIS — H538 Other visual disturbances: Secondary | ICD-10-CM | POA: Diagnosis not present

## 2018-03-18 DIAGNOSIS — H04123 Dry eye syndrome of bilateral lacrimal glands: Secondary | ICD-10-CM | POA: Diagnosis not present

## 2018-03-18 LAB — BASIC METABOLIC PANEL
Anion gap: 11 (ref 5–15)
BUN: 15 mg/dL (ref 6–20)
CO2: 25 mmol/L (ref 22–32)
Calcium: 9.3 mg/dL (ref 8.9–10.3)
Chloride: 102 mmol/L (ref 101–111)
Creatinine, Ser: 0.64 mg/dL (ref 0.44–1.00)
GFR calc Af Amer: >60 mL/min (ref >60)
GFR calc non Af Amer: >60 mL/min (ref >60)
Glucose, Bld: 95 mg/dL (ref 65–99)
Potassium: 4 mmol/L (ref 3.5–5.1)
Sodium: 138 mmol/L (ref 135–145)

## 2018-03-18 LAB — CBC
HCT: 37.7 % (ref 36.0–46.0)
Hemoglobin: 11.9 g/dL — ABNORMAL LOW (ref 12.0–15.0)
MCH: 27.4 pg (ref 26.0–34.0)
MCHC: 31.6 g/dL (ref 30.0–36.0)
MCV: 86.7 fL (ref 78.0–100.0)
Platelets: 303 10*3/uL (ref 150–400)
RBC: 4.35 MIL/uL (ref 3.87–5.11)
RDW: 14.4 % (ref 11.5–15.5)
WBC: 5.5 10*3/uL (ref 4.0–10.5)

## 2018-03-18 MED ORDER — ACETAMINOPHEN 325 MG PO TABS
325.0000 mg | ORAL_TABLET | ORAL | Status: AC | PRN
Start: 2018-03-18 — End: ?

## 2018-03-18 MED ORDER — TACROLIMUS 0.1 % EX OINT: 1.0000 "application " | TOPICAL_OINTMENT | Freq: Two times a day (BID) | CUTANEOUS | 0 refills | Status: AC | PRN

## 2018-03-18 MED ORDER — SALINE SPRAY 0.65 % NA SOLN: 1.0000 | NASAL | 0 refills | Status: AC | PRN

## 2018-03-18 MED ORDER — DOCUSATE SODIUM 100 MG PO CAPS: 100.0000 mg | ORAL_CAPSULE | Freq: Two times a day (BID) | ORAL | 0 refills | Status: DC

## 2018-03-18 NOTE — Progress Notes (Signed)
[facility] PHYSICAL MEDICINE & REHABILITATION     PROGRESS NOTE    Subjective/Complaints: Up in room on walker. Pleased with progress. Happy to be going home  ROS: Patient denies fever, rash, sore throat, blurred vision, nausea, vomiting, diarrhea, cough, shortness of breath or chest pain, joint or back pain, headache, or mood change.   Objective: Vital Signs: Blood pressure (!) 99/53, pulse 65, temperature 98.4 F (36.9 C), temperature source Oral, resp. rate 18, height 5\' 7"  (1.702 m), weight 70.1 kg (154 lb 8.7 oz), SpO2 95 %. No results found. Recent Labs    03/18/18 0518  WBC 5.5  HGB 11.9*  HCT 37.7  PLT 303   Recent Labs    03/18/18 0518  NA 138  K 4.0  CL 102  GLUCOSE 95  BUN 15  CREATININE 0.64  CALCIUM 9.3   CBG (last 3)  No results for input(s): GLUCAP in the last 72 hours.  Wt Readings from Last 3 Encounters:  03/12/18 70.1 kg (154 lb 8.7 oz)  03/06/18 70.2 kg (154 lb 12.2 oz)  02/17/18 71.2 kg (157 lb)    Physical Exam:  Constitutional: No distress . Vital signs reviewed. HEENT: EOMI, oral membranes moist Neck: supple Cardiovascular: RRR without murmur. No JVD    Respiratory: CTA Bilaterally without wheezes or rales. Normal effort    GI: BS +, non-tender, non-distended   Musculoskeletal: She exhibits noedemaor tenderness. Sternum/chest minimally tender with palpation Neurological: She isalertand oriented to person, place, and time.good standing balance   Improved processing follows simple commands. good insight and awareness. Normal memory.  Motor 5/5 UE's bilaterally. LE's 4/5 prox to distal. Skin: Few scattered bruises as noted above Psychiatric: pleasant   Assessment/Plan: 1.  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 Physiatrist is providing close team supervision and 24 hour management of active  medical problems listed below. Physiatrist and rehab team continue to assess barriers to discharge/monitor patient progress toward functional and medical goals.  Function:  Bathing Bathing position   Position: Shower  Bathing parts Body parts bathed by patient: Right arm, Left arm, Chest, Abdomen, Front perineal area, Buttocks, Right upper leg, Left upper leg, Right lower leg, Left lower leg, Back Body parts bathed by helper: Back  Bathing assist Assist Level: No help, No cues      Upper Body Dressing/Undressing Upper body dressing   What is the patient wearing?: Pull over shirt/dress     Pull over shirt/dress - Perfomed by patient: Thread/unthread right sleeve, Thread/unthread left sleeve, Put head through opening, Pull shirt over trunk          Upper body assist Assist Level: More than reasonable time   Set up : To obtain clothing/put away  Lower Body Dressing/Undressing Lower body dressing   What is the patient wearing?: Underwear, Pants, Socks, Shoes Underwear - Performed by patient: Thread/unthread right underwear leg, Thread/unthread left underwear leg, Pull underwear up/down   Pants- Performed by patient: Thread/unthread right pants leg, Thread/unthread left pants leg, Pull pants up/down     Non-skid slipper socks- Performed by helper: Don/doff left sock Socks - Performed by patient: Don/doff right sock, Don/doff left sock Socks - Performed by helper: Don/doff right sock, Don/doff left sock Shoes - Performed by patient: Don/doff right shoe, Don/doff left shoe, Fasten right, Fasten left Shoes - Performed by helper: Don/doff right shoe, Don/doff left shoe  Lower body assist Assist for lower body dressing: More than reasonable time   Set up : To obtain clothing/put away  Toileting Toileting   Toileting steps completed by patient: Adjust clothing prior to toileting, Performs perineal hygiene, Adjust clothing after toileting   Toileting Assistive Devices:  Grab bar or rail  Toileting assist Assist level: More than reasonable time   Transfers Chair/bed transfer   Chair/bed transfer method: Stand pivot, Ambulatory Chair/bed transfer assist level: No Help, no cues, assistive device, takes more than a reasonable amount of time Chair/bed transfer assistive device: Armrests, Medical sales representative     Max distance: 150; Assist level: No help, No cues, assistive device, takes more than a reasonable amount of time   Wheelchair   Type: Manual Max wheelchair distance: 20 Assist Level: Touching or steadying assistance (Pt > 75%)  Cognition Comprehension Comprehension assist level: Understands complex 90% of the time/cues 10% of the time  Expression Expression assist level: Expresses complex 90% of the time/cues < 10% of the time  Social Interaction Social Interaction assist level: Interacts appropriately 90% of the time - Needs monitoring or encouragement for participation or interaction.  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 I would not rush to make prescription changes, etc 2. DVT Prophylaxis/Anticoagulation: Pharmaceutical:Lovenox 3.Chronic HA/Fibromyalgia/Pain Management:Used Naprosyn prn at home.Robaxin or tylenol prn, ice/local care to chest wall   -Continue scheduled ice for chest -pain improving 4. Mood:LCSW to follow for evaluation and support. 5. Neuropsych: This patientiscapable of making decisions on herown behalf. 6. Skin/Wound Care:Routine  pressure relief measures. 7. Fluids/Electrolytes/Nutrition:   -I personally reviewed the patient's labs today.   8. ABLA:  .  Hemoglobin up to 119 today  -  9. Hypokalemia: supplemented. K+ 4.0 today    10. PAF: HR a little elevated.  Continue metoprolol bid.  Eliquis on hold due to recent TBI.   11. Bilateral atelectasis: encourage IS with flutter valve.  12. Asthma: Continue budesonide nebs bid. No apparent issues at present   LOS (Days) [place] EVALUATION WAS PERFORMED  Meredith Staggers, MD 03/18/2018 9:12 AM

## 2018-03-18 NOTE — Progress Notes (Signed)
Pt. Got d/c instructions.Pt. Is ready to go home with husband.

## 2018-03-18 NOTE — Discharge Instructions (Signed)
Inpatient Rehab Discharge Instructions  DANIYA ARAMBURO Discharge date and time: 03/18/18   Activities/Precautions/ Functional Status: Activity: no lifting, driving, or strenuous exercise  till cleared by MD.  Diet: low fat, low cholesterol diet Wound Care: none needed   Functional status:  ___ No restrictions     ___ Walk up steps independently ___ 24/7 supervision/assistance   ___ Walk up steps with assistance _X__ Intermittent supervision/assistance  ___ Bathe/dress independently ___ Walk with walker     _X__ Bathe/dress with assistance ___ Walk Independently    ___ Shower independently ___ Walk with assistance    ___ Shower with assistance _X__ No alcohol     ___ Return to work/school ________   Special Instructions: 1. Note medication changes--need to follow up with Dr. Christella Noa for input regarding resuming blood thinners. 2. Family needs to help manage medications and help provide supervision with cognitive tasks.     COMMUNITY REFERRALS UPON DISCHARGE:    Home Health:   PT & SP  Agency:[facility] Phone:[phone]   Date of last service:03/18/2018  Medical Equipment/Items Ordered:NO NEEDS     My questions have been answered and I understand these instructions. I will adhere to these goals and the provided educational materials after my discharge from the hospital.  Patient/Caregiver Signature _______________________________ Date __________  Clinician Signature _______________________________________ Date __________  Please bring this form and your medication list with you to all your follow-up doctor's appointments.

## 2018-03-18 NOTE — Progress Notes (Signed)
Social Work  Discharge Note  The overall goal for the admission was met for:   Discharge location: Yes - home with spouse who is able to provide 24/7 assistance  Length of Stay: Yes - 7 days  Discharge activity level: Yes - supervision overall  Home/community participation: Yes  Services provided included: MD, RD, PT, OT, SLP, RN, TR, Pharmacy and [place]: Private Insurance: [employer]  Follow-up services arranged: Home Health: PT, ST via [facility] and Patient/Family has no preference for HH/DME agencies  Comments (or additional information):  Patient/Family verbalized understanding of follow-up arrangements: Yes  Individual responsible for coordination of the follow-up plan: pt  Confirmed correct DME delivered: NA - no needs    Earla Charlie

## 2018-03-18 NOTE — Discharge Summary (Signed)
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  VITAMIN B-1 Take 100 mg by mouth daily.      Follow-up Information    Meredith Staggers, MD Follow up.   Specialty:  Physical Medicine and Rehabilitation Why:  Office will call you with follow up appointment Contact information: [address] [place] [address] [phone]        Ashok Pall, MD. Call in 1 day(s).   Specialty:  Neurosurgery Why:  for follow up appointment/input on resuming Eliquis Contact information: [address] [place] [address] [place] [address] [phone]        Binnie Rail, MD. Call in 1 day(s).   Specialty:  Internal Medicine Why:  for post hospital follow up in 1-2 weeks.  Contact information: [place] [address] [phone]           Signed: Bary Leriche 03/19/2018, 3:20 PM

## 2018-03-19 ENCOUNTER — Telehealth: Payer: Self-pay | Admitting: *Deleted

## 2018-03-19 ENCOUNTER — Ambulatory Visit: Payer: PPO | Admitting: Internal Medicine

## 2018-03-19 NOTE — Telephone Encounter (Signed)
Transition Care Management Follow-up Telephone Call   Date discharged? 03/18/18   How have you been since you were released from the hospital? Pt states she is doing alright   Do you understand why you were in the hospital? YES   Do you understand the discharge instructions? YES   Where were you discharged to? Home   Items Reviewed:  Medications reviewed: YES  Allergies reviewed: YES  Dietary changes reviewed: NO  Referrals reviewed: YES   Functional Questionnaire:   Activities of Daily Living (ADLs):   She states she are independent in the following: bathing and hygiene, feeding, continence, grooming, toileting and dressing States they require assistance with the following: ambulation   Any transportation issues/concerns?: NO   Any patient concerns? NO   Confirmed importance and date/time of follow-up visits scheduled YES, appt 03/24/18  Provider Appointment booked with Dr. Quay Burow  Confirmed with patient if condition begins to worsen call PCP or go to the ER.  Patient was given the office number and encouraged to call back with question or concerns.  : YES
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
## 2018-03-20 ENCOUNTER — Telehealth: Payer: Self-pay | Admitting: Registered Nurse

## 2018-03-20 ENCOUNTER — Other Ambulatory Visit: Payer: Self-pay

## 2018-03-20 NOTE — Telephone Encounter (Signed)
Transition Care Call  Patient name: Kara Ramirez              DOB: 01/20/1951 1. Are you/is patient experiencing any problems since coming home? No, she reports she is adjusting.  a. Are there any questions regarding any aspect of care? No 2. Are there any questions regarding medications administration/dosing? No a. Are meds being taken as prescribed? Yes                    "Patient should review meds with caller to confirm"  3. Have there been any falls? No 4. Has Home Health been to the house and/or have they contacted you? Yes, [facility]. [name] would prefer to go to outpatient physical therapy due to renovations in her home, she inquired about [facility]. This provider placed a call to [facility], they only have physical therapy. She's schedule for Physical, Occupational and Speech Therapy, left a message for [name]. Awaiting a call back.  a. If not, have you tried to contact them? NA b. Can we help you contact them? No 5. Are bowels and bladder emptying properly? Yes a. Are there any unexpected incontinence issues? No b. If applicable, is patient following bowel/bladder programs? NA 6. Any fevers, problems with breathing, unexpected pain? No 7. Are there any skin problems or new areas of breakdown? No 8. Has the patient/family member arranged specialty MD follow up (ie cardiology/neurology/renal/surgical/etc.)?  All follow up appointments have been scheduled.  a. Can we help arrange? NA 9. Does the patient need any other services or support that we can help arrange? No 10. Are caregivers following through as expected in assisting the patient? Yes 11. Has the patient quit smoking, drinking alcohol, or using drugs as recommended? [name] denies smoking, drinking alcohol or illicit drug use.   Appointment date/time 03/25/2018, arrival time 10:20 for 10:40 appointment with Dr. Naaman Plummer. [place]
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
## 2018-03-21 DIAGNOSIS — M4120 Other idiopathic scoliosis, site unspecified: Secondary | ICD-10-CM | POA: Diagnosis not present

## 2018-03-21 DIAGNOSIS — S39012A Strain of muscle, fascia and tendon of lower back, initial encounter: Secondary | ICD-10-CM | POA: Diagnosis not present
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
## 2018-03-23 NOTE — Patient Instructions (Addendum)
   Medications reviewed and updated.  No changes recommended at this time.   A referral was ordered for outpatient physical therapy and speech therapy.     Please followup in 2 months

## 2018-03-24 ENCOUNTER — Ambulatory Visit (INDEPENDENT_AMBULATORY_CARE_PROVIDER_SITE_OTHER): Payer: PPO | Admitting: Internal Medicine

## 2018-03-24 ENCOUNTER — Encounter: Payer: Self-pay | Admitting: Internal Medicine

## 2018-03-24 VITALS — BP 118/76 | HR 85 | Temp 97.7°F | Resp 16 | Wt 152.0 lb

## 2018-03-24 DIAGNOSIS — R479 Unspecified speech disturbances: Secondary | ICD-10-CM | POA: Diagnosis not present

## 2018-03-24 DIAGNOSIS — M25551 Pain in right hip: Secondary | ICD-10-CM | POA: Diagnosis not present

## 2018-03-24 DIAGNOSIS — M25552 Pain in left hip: Secondary | ICD-10-CM | POA: Diagnosis not present

## 2018-03-24 DIAGNOSIS — I609 Nontraumatic subarachnoid hemorrhage, unspecified: Secondary | ICD-10-CM

## 2018-03-24 DIAGNOSIS — R2689 Other abnormalities of gait and mobility: Secondary | ICD-10-CM | POA: Diagnosis not present

## 2018-03-24 DIAGNOSIS — M25559 Pain in unspecified hip: Secondary | ICD-10-CM | POA: Insufficient documentation
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
## 2018-03-25 ENCOUNTER — Other Ambulatory Visit: Payer: Self-pay

## 2018-03-25 ENCOUNTER — Encounter: Payer: Self-pay | Admitting: Physical Medicine & Rehabilitation

## 2018-03-25 ENCOUNTER — Encounter: Payer: PPO | Attending: Physical Medicine & Rehabilitation | Admitting: Physical Medicine & Rehabilitation

## 2018-03-25 DIAGNOSIS — M533 Sacrococcygeal disorders, not elsewhere classified: Secondary | ICD-10-CM | POA: Diagnosis not present

## 2018-03-25 DIAGNOSIS — Z90710 Acquired absence of both cervix and uterus: Secondary | ICD-10-CM | POA: Insufficient documentation

## 2018-03-25 DIAGNOSIS — R51 Headache: Secondary | ICD-10-CM | POA: Diagnosis not present

## 2018-03-25 DIAGNOSIS — E785 Hyperlipidemia, unspecified: Secondary | ICD-10-CM | POA: Diagnosis not present

## 2018-03-25 DIAGNOSIS — I480 Paroxysmal atrial fibrillation: Secondary | ICD-10-CM | POA: Diagnosis not present

## 2018-03-25 DIAGNOSIS — M797 Fibromyalgia: Secondary | ICD-10-CM | POA: Diagnosis not present

## 2018-03-25 DIAGNOSIS — T1490XA Injury, unspecified, initial encounter: Secondary | ICD-10-CM | POA: Insufficient documentation

## 2018-03-25 DIAGNOSIS — S069X3S Unspecified intracranial injury with loss of consciousness of 1 hour to 5 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, sequela: Secondary | ICD-10-CM | POA: Diagnosis not present

## 2018-03-25 DIAGNOSIS — M79651 Pain in right thigh: Secondary | ICD-10-CM | POA: Insufficient documentation

## 2018-03-25 DIAGNOSIS — Z8249 Family history of ischemic heart disease and other diseases of the circulatory system: Secondary | ICD-10-CM | POA: Diagnosis not present

## 2018-03-25 DIAGNOSIS — I609 Nontraumatic subarachnoid hemorrhage, unspecified: Secondary | ICD-10-CM | POA: Diagnosis not present

## 2018-03-25 DIAGNOSIS — Z9889 Other specified postprocedural states: Secondary | ICD-10-CM | POA: Insufficient documentation

## 2018-03-25 DIAGNOSIS — Z800 Family history of malignant neoplasm of digestive organs: Secondary | ICD-10-CM | POA: Insufficient documentation

## 2018-03-25 DIAGNOSIS — S069X3A Unspecified intracranial injury with loss of consciousness of 1 hour to 5 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, initial encounter: Secondary | ICD-10-CM | POA: Insufficient documentation
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
## 2018-03-25 NOTE — Patient Instructions (Signed)
NAPROXEN: 220MG , TAKE ONE TWICE DAILY WITH FOOD.  TYLENOL 500MG  EVERY 6 AS NEEDED   ICE TO YOUR BUTT  PAD FOR SEAT

## 2018-04-01 DIAGNOSIS — L900 Lichen sclerosus et atrophicus: Secondary | ICD-10-CM | POA: Diagnosis not present

## 2018-04-01 DIAGNOSIS — L821 Other seborrheic keratosis: Secondary | ICD-10-CM | POA: Diagnosis not present

## 2018-04-01 DIAGNOSIS — D692 Other nonthrombocytopenic purpura: Secondary | ICD-10-CM | POA: Diagnosis not present

## 2018-04-01 DIAGNOSIS — D1801 Hemangioma of skin and subcutaneous tissue: Secondary | ICD-10-CM | POA: Diagnosis not present

## 2018-04-02 ENCOUNTER — Ambulatory Visit: Payer: PPO | Attending: Physical Medicine & Rehabilitation | Admitting: Physical Therapy

## 2018-04-02 DIAGNOSIS — R2689 Other abnormalities of gait and mobility: Secondary | ICD-10-CM | POA: Insufficient documentation

## 2018-04-02 DIAGNOSIS — R293 Abnormal posture: Secondary | ICD-10-CM | POA: Insufficient documentation

## 2018-04-02 DIAGNOSIS — R41841 Cognitive communication deficit: Secondary | ICD-10-CM | POA: Insufficient documentation

## 2018-04-02 DIAGNOSIS — M6281 Muscle weakness (generalized): Secondary | ICD-10-CM | POA: Insufficient documentation

## 2018-04-02 DIAGNOSIS — R2681 Unsteadiness on feet: Secondary | ICD-10-CM | POA: Insufficient documentation

## 2018-04-02 DIAGNOSIS — R4701 Aphasia: Secondary | ICD-10-CM | POA: Insufficient documentation

## 2018-04-04 ENCOUNTER — Ambulatory Visit: Payer: PPO

## 2018-04-04 ENCOUNTER — Encounter: Payer: Self-pay | Admitting: Rehabilitation

## 2018-04-04 ENCOUNTER — Ambulatory Visit: Payer: PPO | Admitting: Rehabilitation

## 2018-04-04 DIAGNOSIS — M6281 Muscle weakness (generalized): Secondary | ICD-10-CM | POA: Diagnosis not present

## 2018-04-04 DIAGNOSIS — R4701 Aphasia: Secondary | ICD-10-CM | POA: Diagnosis not present

## 2018-04-04 DIAGNOSIS — R2681 Unsteadiness on feet: Secondary | ICD-10-CM

## 2018-04-04 DIAGNOSIS — R293 Abnormal posture: Secondary | ICD-10-CM

## 2018-04-04 DIAGNOSIS — R2689 Other abnormalities of gait and mobility: Secondary | ICD-10-CM | POA: Diagnosis not present

## 2018-04-04 DIAGNOSIS — R41841 Cognitive communication deficit: Secondary | ICD-10-CM

## 2018-04-04 NOTE — Patient Instructions (Signed)
   I think we will be a good team to work on your:  attention to detail,   alternating attention,   some memory strategies,   and knowing you are making errors when they occur  We will also work on   anticipating possible pitfalls and modifying tasks or activities accordingly  Right now I suggest your husband watch you with taking your meds, until you do it correctly for about three weeks.
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
## 2018-04-07 ENCOUNTER — Ambulatory Visit: Payer: PPO | Admitting: Internal Medicine
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
## 2018-04-08 ENCOUNTER — Encounter: Payer: Self-pay | Admitting: Physical Therapy

## 2018-04-08 ENCOUNTER — Ambulatory Visit: Payer: PPO | Admitting: Physical Therapy

## 2018-04-08 DIAGNOSIS — M6281 Muscle weakness (generalized): Secondary | ICD-10-CM

## 2018-04-08 DIAGNOSIS — R2681 Unsteadiness on feet: Secondary | ICD-10-CM

## 2018-04-08 DIAGNOSIS — R2689 Other abnormalities of gait and mobility: Secondary | ICD-10-CM

## 2018-04-08 DIAGNOSIS — R293 Abnormal posture: Secondary | ICD-10-CM
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
## 2018-04-08 NOTE — Patient Instructions (Signed)
Access Code: [email or web address]  URL: [email or web address]  Date: 04/08/2018  Prepared by: Misty Stanley   Exercises  Corner Pec Major Stretch - 2 reps - 30 second hold - 1x daily - 7x weekly  Half Tandem Stance Balance with Head Rotation - 10 reps - 1x daily - 4x weekly  Half Tandem Stance Balance with Head Nods - 10 reps - 4 sets - 1x daily - 4x weekly  Standing Single Leg Stance with Counter Support - 10 reps - 4 sets - 1x daily - 4x weekly

## 2018-04-10 ENCOUNTER — Ambulatory Visit: Payer: PPO | Admitting: Rehabilitation

## 2018-04-10 ENCOUNTER — Encounter: Payer: Self-pay | Admitting: Rehabilitation

## 2018-04-10 DIAGNOSIS — R2681 Unsteadiness on feet: Secondary | ICD-10-CM

## 2018-04-10 DIAGNOSIS — R2689 Other abnormalities of gait and mobility: Secondary | ICD-10-CM

## 2018-04-10 DIAGNOSIS — R293 Abnormal posture: Secondary | ICD-10-CM

## 2018-04-10 DIAGNOSIS — M6281 Muscle weakness (generalized): Secondary | ICD-10-CM
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
## 2018-04-10 NOTE — Patient Instructions (Signed)
Access Code: [email or web address]  URL: [email or web address]  Date: 04/10/2018  Prepared by: Cameron Sprang   Exercises  Corner Pec Major Stretch - 2 reps - 30 second hold - 1x daily - 7x weekly  Half Tandem Stance Balance with Head Rotation - 10 reps - 1x daily - 4x weekly  Half Tandem Stance Balance with Head Nods - 10 reps - 4 sets - 1x daily - 4x weekly  Standing Single Leg Stance with Counter Support - 10 reps - 4 sets - 1x daily - 4x weekly  Sit to Stand with Arm Reach and Jump - 10 reps - 1 sets - 1x daily - 7x weekly  Standing Hip Abduction with Anterior Support - 10 reps - 1 sets - 1x daily - 7x weekly

## 2018-04-17 ENCOUNTER — Encounter: Payer: Self-pay | Admitting: Rehabilitation

## 2018-04-17 ENCOUNTER — Ambulatory Visit: Payer: PPO | Admitting: Rehabilitation

## 2018-04-17 DIAGNOSIS — M6281 Muscle weakness (generalized): Secondary | ICD-10-CM

## 2018-04-17 DIAGNOSIS — R2689 Other abnormalities of gait and mobility: Secondary | ICD-10-CM

## 2018-04-17 DIAGNOSIS — R2681 Unsteadiness on feet: Secondary | ICD-10-CM | POA: Diagnosis not present

## 2018-04-17 DIAGNOSIS — R293 Abnormal posture: Secondary | ICD-10-CM
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
## 2018-04-17 NOTE — Patient Instructions (Addendum)
Thoracic Self-Mobilization (Supine)    With rolled towel placed lengthwise at lower ribs level, lie back on towel with arms outstretched. Hold 1-2 minutes. Relax.  Start with arms in a "T" shape, work your way slowly to a "Y" shape.  Repeat _2-3___ times per set. Do __1__ sets per session. Do __2__ sessions per day.  http://orth.exer.us/1001   Copyright  VHI. All rights reserved.    Gaze Stabilization: Sitting    Keeping eyes on target on wall 3-4 feet away, tilt head down 15-30 and move head side to side for __15-20__ seconds. Repeat while moving head up and down for __15-20__ seconds.  Make sure you only turn as far as able to keep one letter.   Do __1-2__ sessions per day.  Go just before it turns into two (just when you see the shadow of two) stop there and see if you can focus enough to bring to one.  Do in all planes/directions.    Copyright  VHI. All rights reserved.

## 2018-04-21 ENCOUNTER — Encounter: Payer: Self-pay | Admitting: Rehabilitation

## 2018-04-21 ENCOUNTER — Ambulatory Visit: Payer: PPO | Attending: Physical Medicine & Rehabilitation | Admitting: Rehabilitation

## 2018-04-21 DIAGNOSIS — R293 Abnormal posture: Secondary | ICD-10-CM

## 2018-04-21 DIAGNOSIS — M6281 Muscle weakness (generalized): Secondary | ICD-10-CM | POA: Diagnosis not present

## 2018-04-21 DIAGNOSIS — R2681 Unsteadiness on feet: Secondary | ICD-10-CM | POA: Diagnosis not present

## 2018-04-21 DIAGNOSIS — R41841 Cognitive communication deficit: Secondary | ICD-10-CM | POA: Diagnosis not present

## 2018-04-21 DIAGNOSIS — R2689 Other abnormalities of gait and mobility: Secondary | ICD-10-CM

## 2018-04-21 DIAGNOSIS — R4701 Aphasia: Secondary | ICD-10-CM | POA: Insufficient documentation
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
## 2018-04-24 ENCOUNTER — Ambulatory Visit: Payer: PPO | Admitting: Rehabilitation

## 2018-04-24 ENCOUNTER — Encounter: Payer: Self-pay | Admitting: Rehabilitation

## 2018-04-24 ENCOUNTER — Ambulatory Visit: Payer: PPO | Admitting: Speech Pathology

## 2018-04-24 DIAGNOSIS — R2681 Unsteadiness on feet: Secondary | ICD-10-CM

## 2018-04-24 DIAGNOSIS — R293 Abnormal posture: Secondary | ICD-10-CM

## 2018-04-24 DIAGNOSIS — R41841 Cognitive communication deficit: Secondary | ICD-10-CM

## 2018-04-24 DIAGNOSIS — R4701 Aphasia: Secondary | ICD-10-CM

## 2018-04-24 DIAGNOSIS — R2689 Other abnormalities of gait and mobility: Secondary | ICD-10-CM

## 2018-04-24 DIAGNOSIS — M6281 Muscle weakness (generalized): Secondary | ICD-10-CM
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
## 2018-04-24 NOTE — Patient Instructions (Addendum)
   Cognitive Activities you can do at home:   - Solitaire  - [place]  - Chess/Checkers  - Crosswords (easy level)  - [place]  - jig saw puzzles  On your computer, tablet or phone: [place] [employer]  Use a paper to keep your reading on tract    Tips to help facilitate better attention, concentration, focus   Do harder, longer tasks when you are most alert/awake  Break down larger tasks into small parts  Limit distractions of TV, radio, conversation, e mails/texts, appliance noise, etc - if a job is important, do it in a quiet room  Be aware of how you are functioning in high stimulation environments such as large stores, parties, restaurants - any place with lots of lights, noise, signs etc  Group conversations may be more difficult to process than one on one conversations  Give yourself extra time to process conversation, reading materials, directions or information from your healthcare providers  Organization is key - clutters of laundry, mail, paperwork, dirty dishes - all make it more difficult to concentrate  Before you start a task, have all the needed supplies, directions, recipes ready and organized. This way you don't have to go looking for something in the middle of a task and become distracted.   Be aware of fatigue - take rests or breaks when needed to re-group and re-focus
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
## 2018-04-28 ENCOUNTER — Ambulatory Visit: Payer: PPO | Admitting: Rehabilitation

## 2018-04-28 ENCOUNTER — Encounter: Payer: Self-pay | Admitting: Rehabilitation

## 2018-04-28 DIAGNOSIS — R2681 Unsteadiness on feet: Secondary | ICD-10-CM

## 2018-04-28 DIAGNOSIS — R2689 Other abnormalities of gait and mobility: Secondary | ICD-10-CM

## 2018-04-28 DIAGNOSIS — M6281 Muscle weakness (generalized): Secondary | ICD-10-CM
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
## 2018-05-01 ENCOUNTER — Encounter: Payer: Self-pay | Admitting: Rehabilitation

## 2018-05-01 ENCOUNTER — Ambulatory Visit: Payer: PPO | Admitting: Rehabilitation

## 2018-05-01 DIAGNOSIS — R2681 Unsteadiness on feet: Secondary | ICD-10-CM

## 2018-05-01 DIAGNOSIS — R2689 Other abnormalities of gait and mobility: Secondary | ICD-10-CM

## 2018-05-01 DIAGNOSIS — M6281 Muscle weakness (generalized): Secondary | ICD-10-CM
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
## 2018-05-05 ENCOUNTER — Encounter: Payer: Self-pay | Admitting: Rehabilitation

## 2018-05-05 ENCOUNTER — Ambulatory Visit: Payer: PPO | Admitting: Rehabilitation

## 2018-05-05 DIAGNOSIS — M6281 Muscle weakness (generalized): Secondary | ICD-10-CM

## 2018-05-05 DIAGNOSIS — R2681 Unsteadiness on feet: Secondary | ICD-10-CM

## 2018-05-05 DIAGNOSIS — R293 Abnormal posture: Secondary | ICD-10-CM

## 2018-05-05 DIAGNOSIS — R2689 Other abnormalities of gait and mobility: Secondary | ICD-10-CM

## 2018-05-05 NOTE — Patient Instructions (Addendum)
Access Code: [email or web address]  URL: [email or web address]  Date: 04/08/2018  Prepared by: Misty Stanley   Exercises   Corner Pec Major Stretch - 2 reps - 30 second hold - 1x daily - 7x weekly   Half Tandem Stance Balance with Head Rotation - 10 reps - 1x daily - 4x weekly   Half Tandem Stance Balance with Head Nods - 10 reps - 4 sets - 1x daily - 4x weekly   Standing Single Leg Stance with Counter Support - 10 reps - 4 sets - 1x daily - 4x weekly     Thoracic Self-Mobilization (Supine)    With rolled towel placed lengthwise at lower ribs level, lie back on towel with arms outstretched. Hold 1-2 minutes. Relax.  Start with arms in a "T" shape, work your way slowly to a "Y" shape.  Repeat _2-3___ times per set. Do __1__ sets per session. Do __2__ sessions per day.
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
## 2018-05-07 ENCOUNTER — Encounter: Payer: Self-pay | Admitting: Rehabilitative and Restorative Service Providers"

## 2018-05-07 ENCOUNTER — Ambulatory Visit: Payer: PPO | Admitting: Speech Pathology

## 2018-05-07 ENCOUNTER — Encounter: Payer: Self-pay | Admitting: Speech Pathology

## 2018-05-07 ENCOUNTER — Ambulatory Visit: Payer: PPO | Admitting: Rehabilitative and Restorative Service Providers"

## 2018-05-07 DIAGNOSIS — R41841 Cognitive communication deficit: Secondary | ICD-10-CM

## 2018-05-07 DIAGNOSIS — R2689 Other abnormalities of gait and mobility: Secondary | ICD-10-CM

## 2018-05-07 DIAGNOSIS — R4701 Aphasia: Secondary | ICD-10-CM

## 2018-05-07 DIAGNOSIS — R2681 Unsteadiness on feet: Secondary | ICD-10-CM

## 2018-05-07 DIAGNOSIS — M6281 Muscle weakness (generalized): Secondary | ICD-10-CM
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
## 2018-05-07 NOTE — Patient Instructions (Signed)
Access Code: [email or web address]  URL: [email or web address]  Date: 05/07/2018  Prepared by: Rudell Cobb   Exercises Corner Pec Major Stretch - 2 reps - 30 second hold - 1x daily - 7x weekly Half Tandem Stance Balance with Head Rotation - 10 reps - 1x daily - 4x weekly Half Tandem Stance Balance with Head Nods - 10 reps - 4 sets - 1x daily - 4x weekly Standing Single Leg Stance with Counter Support - 10 reps - 4 sets - 1x daily - 4x weekly Sit to Stand with Arm Reach and Jump - 10 reps - 1 sets - 1x daily                            - 7x weekly Standing Hip Abduction with Anterior Support - 10 reps - 1 sets - 1x daily - 7x weekly Seated Gaze Stabilization with Head Rotation - 20 reps - 1 sets - 2x daily - 7x weekly   Thoracic Self-Mobilization (Supine)    With rolled towel placed lengthwise at lower ribs level, lie back on towel with arms outstretched. Hold1-2 minutes. Relax.Start with arms in a "T" shape, work your way slowly to a "Y" shape. Repeat _2-3___ times per set. Do __1__ sets per session. Do __2__ sessions per day.         Electronically signed by Berniece Andreas, PT at 05/05/2018 9:52 AM
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
## 2018-05-12 ENCOUNTER — Ambulatory Visit: Payer: PPO | Admitting: Rehabilitation

## 2018-05-12 ENCOUNTER — Encounter: Payer: Self-pay | Admitting: Rehabilitation

## 2018-05-12 DIAGNOSIS — R2681 Unsteadiness on feet: Secondary | ICD-10-CM | POA: Diagnosis not present

## 2018-05-12 DIAGNOSIS — M6281 Muscle weakness (generalized): Secondary | ICD-10-CM

## 2018-05-12 DIAGNOSIS — R2689 Other abnormalities of gait and mobility: Secondary | ICD-10-CM

## 2018-05-12 DIAGNOSIS — R293 Abnormal posture: Secondary | ICD-10-CM
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
## 2018-05-13 ENCOUNTER — Other Ambulatory Visit: Payer: Self-pay | Admitting: Allergy

## 2018-05-14 ENCOUNTER — Encounter: Payer: Self-pay | Admitting: Rehabilitative and Restorative Service Providers"

## 2018-05-14 ENCOUNTER — Ambulatory Visit: Payer: PPO | Admitting: Rehabilitative and Restorative Service Providers"

## 2018-05-14 ENCOUNTER — Ambulatory Visit: Payer: PPO | Admitting: Speech Pathology

## 2018-05-14 DIAGNOSIS — R2689 Other abnormalities of gait and mobility: Secondary | ICD-10-CM

## 2018-05-14 DIAGNOSIS — R2681 Unsteadiness on feet: Secondary | ICD-10-CM

## 2018-05-14 DIAGNOSIS — M6281 Muscle weakness (generalized): Secondary | ICD-10-CM

## 2018-05-14 DIAGNOSIS — R293 Abnormal posture: Secondary | ICD-10-CM

## 2018-05-14 DIAGNOSIS — R4701 Aphasia: Secondary | ICD-10-CM

## 2018-05-14 DIAGNOSIS — R41841 Cognitive communication deficit: Secondary | ICD-10-CM
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
## 2018-05-15 DIAGNOSIS — H01009 Unspecified blepharitis unspecified eye, unspecified eyelid: Secondary | ICD-10-CM | POA: Diagnosis not present

## 2018-05-15 DIAGNOSIS — S069X9A Unspecified intracranial injury with loss of consciousness of unspecified duration, initial encounter: Secondary | ICD-10-CM | POA: Diagnosis not present

## 2018-05-15 DIAGNOSIS — H4912 Fourth [trochlear] nerve palsy, left eye: Secondary | ICD-10-CM | POA: Diagnosis not present

## 2018-05-16 ENCOUNTER — Encounter: Payer: Self-pay | Admitting: Allergy

## 2018-05-16 ENCOUNTER — Ambulatory Visit (INDEPENDENT_AMBULATORY_CARE_PROVIDER_SITE_OTHER): Payer: PPO | Admitting: Allergy

## 2018-05-16 ENCOUNTER — Other Ambulatory Visit: Payer: Self-pay

## 2018-05-16 VITALS — BP 110/70 | HR 75 | Temp 98.1°F | Resp 18

## 2018-05-16 DIAGNOSIS — J302 Other seasonal allergic rhinitis: Secondary | ICD-10-CM

## 2018-05-16 DIAGNOSIS — J4530 Mild persistent asthma, uncomplicated: Secondary | ICD-10-CM | POA: Diagnosis not present

## 2018-05-16 DIAGNOSIS — K9049 Malabsorption due to intolerance, not elsewhere classified: Secondary | ICD-10-CM | POA: Diagnosis not present

## 2018-05-16 DIAGNOSIS — J4520 Mild intermittent asthma, uncomplicated: Secondary | ICD-10-CM | POA: Diagnosis not present

## 2018-05-16 DIAGNOSIS — J040 Acute laryngitis: Secondary | ICD-10-CM | POA: Diagnosis not present

## 2018-05-16 DIAGNOSIS — J3089 Other allergic rhinitis: Secondary | ICD-10-CM

## 2018-05-16 MED ORDER — FLUTICASONE PROPIONATE HFA 110 MCG/ACT IN AERO: 2.0000 | INHALATION_SPRAY | Freq: Two times a day (BID) | RESPIRATORY_TRACT | 5 refills | Status: DC

## 2018-05-16 NOTE — Patient Instructions (Addendum)
1. Mild persistent asthma - continue Flovent two puffs twice daily.  Use with spacer provided today  - Prior to physical activity: ProAir 2 puffs 10-15 minutes before physical activity. - Rescue medications: ProAir 4 puffs every 4-6 hours as needed - Asthma control goals:  * Full participation in all desired activities (may need albuterol before activity) * Albuterol use two time or less a week on average (not counting use with activity) * Cough interfering with sleep two time or less a month * Oral steroids no more than once a year * No hospitalizations  2. Hoarseness of voice - likely viral illness leading to layrngitis with multiple recent sick contacts in family members -  nasal saline spray (i.e., Simply Saline) or nasal saline lavage (i.e., NeilMed) as needed prior to medicated nasal sprays. - For thick post nasal drainage, add guaifenesin [phone] mg (Mucinex)  twice daily as needed with adequate hydration. - continue salt water gargling and hot teas/soups to help soothe throat - rest your voice.  Try to not talk as much as possible to rest the voice  3. Seasonal allergic rhinitis (grasses, molds) - Continue with your Flonase, Claritin.      3. Food intolerance - Continue to avoid all of your triggering foods.  4. Return in about 4 months or sooner if needed
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
## 2018-05-17 ENCOUNTER — Other Ambulatory Visit: Payer: Self-pay | Admitting: Allergy

## 2018-05-19 ENCOUNTER — Encounter: Payer: Self-pay | Admitting: Rehabilitation

## 2018-05-19 ENCOUNTER — Ambulatory Visit: Payer: PPO | Attending: Physical Medicine & Rehabilitation | Admitting: Rehabilitation

## 2018-05-19 ENCOUNTER — Ambulatory Visit: Payer: PPO | Admitting: Speech Pathology

## 2018-05-19 DIAGNOSIS — R293 Abnormal posture: Secondary | ICD-10-CM | POA: Insufficient documentation

## 2018-05-19 DIAGNOSIS — M6281 Muscle weakness (generalized): Secondary | ICD-10-CM | POA: Diagnosis not present

## 2018-05-19 DIAGNOSIS — R4701 Aphasia: Secondary | ICD-10-CM

## 2018-05-19 DIAGNOSIS — R2689 Other abnormalities of gait and mobility: Secondary | ICD-10-CM | POA: Diagnosis not present

## 2018-05-19 DIAGNOSIS — R41841 Cognitive communication deficit: Secondary | ICD-10-CM | POA: Insufficient documentation

## 2018-05-19 DIAGNOSIS — R2681 Unsteadiness on feet: Secondary | ICD-10-CM | POA: Insufficient documentation
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
## 2018-05-19 NOTE — Patient Instructions (Signed)
For homework, calculate your grocery list subtotal. Write the steps you would take to prepare your July 4th meal to have things ready at about the same time. (It's OK that your potato salad, baked beans, and cole slaw are premade)

## 2018-05-21 ENCOUNTER — Encounter: Payer: Self-pay | Admitting: Physical Therapy

## 2018-05-21 ENCOUNTER — Encounter: Payer: Self-pay | Admitting: Speech Pathology

## 2018-05-21 ENCOUNTER — Ambulatory Visit: Payer: PPO | Admitting: Speech Pathology

## 2018-05-21 ENCOUNTER — Ambulatory Visit: Payer: PPO | Admitting: Physical Therapy

## 2018-05-21 DIAGNOSIS — R2681 Unsteadiness on feet: Secondary | ICD-10-CM

## 2018-05-21 DIAGNOSIS — M6281 Muscle weakness (generalized): Secondary | ICD-10-CM

## 2018-05-21 DIAGNOSIS — R41841 Cognitive communication deficit: Secondary | ICD-10-CM

## 2018-05-21 DIAGNOSIS — R4701 Aphasia: Secondary | ICD-10-CM

## 2018-05-21 DIAGNOSIS — R2689 Other abnormalities of gait and mobility: Secondary | ICD-10-CM

## 2018-05-21 DIAGNOSIS — R293 Abnormal posture: Secondary | ICD-10-CM
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
## 2018-05-26 ENCOUNTER — Ambulatory Visit: Payer: PPO | Admitting: Speech Pathology

## 2018-05-26 ENCOUNTER — Encounter: Payer: PPO | Attending: Physical Medicine & Rehabilitation | Admitting: Physical Medicine & Rehabilitation

## 2018-05-26 ENCOUNTER — Ambulatory Visit: Payer: PPO | Admitting: Rehabilitation

## 2018-05-26 ENCOUNTER — Encounter: Payer: Self-pay | Admitting: Physical Medicine & Rehabilitation

## 2018-05-26 ENCOUNTER — Other Ambulatory Visit: Payer: Self-pay

## 2018-05-26 VITALS — BP 112/75 | HR 94 | Ht 65.0 in | Wt 155.8 lb

## 2018-05-26 DIAGNOSIS — M533 Sacrococcygeal disorders, not elsewhere classified: Secondary | ICD-10-CM

## 2018-05-26 DIAGNOSIS — I609 Nontraumatic subarachnoid hemorrhage, unspecified: Secondary | ICD-10-CM | POA: Insufficient documentation

## 2018-05-26 DIAGNOSIS — S069X3S Unspecified intracranial injury with loss of consciousness of 1 hour to 5 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, sequela: Secondary | ICD-10-CM | POA: Diagnosis not present

## 2018-05-26 DIAGNOSIS — M6281 Muscle weakness (generalized): Secondary | ICD-10-CM | POA: Diagnosis not present

## 2018-05-26 DIAGNOSIS — I480 Paroxysmal atrial fibrillation: Secondary | ICD-10-CM | POA: Insufficient documentation

## 2018-05-26 DIAGNOSIS — Z9889 Other specified postprocedural states: Secondary | ICD-10-CM | POA: Diagnosis not present

## 2018-05-26 DIAGNOSIS — E785 Hyperlipidemia, unspecified: Secondary | ICD-10-CM | POA: Diagnosis not present

## 2018-05-26 DIAGNOSIS — Z8249 Family history of ischemic heart disease and other diseases of the circulatory system: Secondary | ICD-10-CM | POA: Diagnosis not present

## 2018-05-26 DIAGNOSIS — R51 Headache: Secondary | ICD-10-CM | POA: Diagnosis not present

## 2018-05-26 DIAGNOSIS — M797 Fibromyalgia: Secondary | ICD-10-CM | POA: Diagnosis not present

## 2018-05-26 DIAGNOSIS — R41841 Cognitive communication deficit: Secondary | ICD-10-CM

## 2018-05-26 DIAGNOSIS — M79651 Pain in right thigh: Secondary | ICD-10-CM | POA: Insufficient documentation

## 2018-05-26 DIAGNOSIS — T1490XA Injury, unspecified, initial encounter: Secondary | ICD-10-CM | POA: Diagnosis not present

## 2018-05-26 DIAGNOSIS — Z90710 Acquired absence of both cervix and uterus: Secondary | ICD-10-CM | POA: Diagnosis not present

## 2018-05-26 DIAGNOSIS — Z800 Family history of malignant neoplasm of digestive organs: Secondary | ICD-10-CM | POA: Insufficient documentation

## 2018-05-26 NOTE — Patient Instructions (Signed)
SUPPLEMENTS USEFUL FOR OSTEOARTHRITIS: OMEGA 3 FATTY ACIDS, TURMERIC, GINGER, TART CHERRY EXTRACT, CELERY SEED, GLUCOSAMINE WITH CHONDROITIN   ?
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
## 2018-05-27 ENCOUNTER — Ambulatory Visit (INDEPENDENT_AMBULATORY_CARE_PROVIDER_SITE_OTHER): Payer: PPO | Admitting: Internal Medicine

## 2018-05-27 ENCOUNTER — Encounter: Payer: Self-pay | Admitting: Internal Medicine

## 2018-05-27 VITALS — BP 122/78 | HR 88 | Temp 98.3°F | Resp 16 | Wt 158.0 lb

## 2018-05-27 DIAGNOSIS — S069X3S Unspecified intracranial injury with loss of consciousness of 1 hour to 5 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, sequela: Secondary | ICD-10-CM | POA: Diagnosis not present

## 2018-05-27 DIAGNOSIS — R7303 Prediabetes: Secondary | ICD-10-CM | POA: Diagnosis not present

## 2018-05-27 DIAGNOSIS — E041 Nontoxic single thyroid nodule: Secondary | ICD-10-CM | POA: Diagnosis not present

## 2018-05-27 DIAGNOSIS — E7849 Other hyperlipidemia: Secondary | ICD-10-CM

## 2018-05-27 NOTE — Assessment & Plan Note (Addendum)
Sugars have been well controlled so we will hold off on blood work today, but will do blood work in a few months when she returns for her physical Low sugar / carb diet Stressed regular exercise
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
## 2018-05-27 NOTE — Assessment & Plan Note (Signed)
Reviewing her CT scans from the hospital there was evidence of a thyroid nodule-thyroid ultrasound was recommended and this has not yet been done Thyroid ultrasound ordered

## 2018-05-27 NOTE — Patient Instructions (Addendum)
   Medications reviewed and updated.  No changes recommended at this time.  A thyroid ultrasound was ordered.   Please followup in 4-6 months for a physical
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
## 2018-05-28 ENCOUNTER — Ambulatory Visit: Payer: PPO | Admitting: Speech Pathology

## 2018-05-28 ENCOUNTER — Ambulatory Visit: Payer: PPO | Admitting: Physical Therapy

## 2018-05-28 ENCOUNTER — Encounter: Payer: Self-pay | Admitting: Physical Therapy

## 2018-05-28 DIAGNOSIS — M6281 Muscle weakness (generalized): Secondary | ICD-10-CM

## 2018-05-28 DIAGNOSIS — R41841 Cognitive communication deficit: Secondary | ICD-10-CM

## 2018-05-28 DIAGNOSIS — R2681 Unsteadiness on feet: Secondary | ICD-10-CM
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
## 2018-06-02 ENCOUNTER — Ambulatory Visit: Payer: PPO | Admitting: Speech Pathology

## 2018-06-02 ENCOUNTER — Ambulatory Visit: Payer: PPO | Admitting: Rehabilitation

## 2018-06-02 ENCOUNTER — Encounter: Payer: Self-pay | Admitting: Rehabilitation

## 2018-06-02 DIAGNOSIS — R293 Abnormal posture: Secondary | ICD-10-CM

## 2018-06-02 DIAGNOSIS — M6281 Muscle weakness (generalized): Secondary | ICD-10-CM | POA: Diagnosis not present

## 2018-06-02 DIAGNOSIS — R2681 Unsteadiness on feet: Secondary | ICD-10-CM

## 2018-06-02 DIAGNOSIS — R2689 Other abnormalities of gait and mobility: Secondary | ICD-10-CM

## 2018-06-02 DIAGNOSIS — R41841 Cognitive communication deficit: Secondary | ICD-10-CM

## 2018-06-02 NOTE — Patient Instructions (Signed)
Access Code: [email or web address]  URL: [email or web address]  Date: 06/02/2018  Prepared by: Cameron Sprang   Exercises  Corner Pec Major Stretch - 2 reps - 30 second hold - 1x daily - 7x weekly  Half Tandem Stance Balance with Head Rotation - 10 reps - 1x daily - 4x weekly  Half Tandem Stance Balance with Head Nods - 10 reps - 4 sets - 1x daily - 4x weekly  Standing Single Leg Stance with Counter Support - 10 reps - 4 sets - 1x daily - 4x weekly  Sit to Stand with Arm Reach and Jump - 10 reps - 1 sets - 1x daily - 7x weekly  Standing Hip Abduction with Anterior Support - 10 reps - 1 sets - 1x daily - 7x weekly  Seated Gaze Stabilization with Head Rotation - 20 reps - 1 sets - 2x daily - 7x weekly
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
## 2018-06-02 NOTE — Patient Instructions (Signed)
Keep working on your writing at home. Try writing some more recipes. Remember to review when you finish to check for spelling errors and mistakes. Bring with you to your next therapy session.

## 2018-06-03 ENCOUNTER — Ambulatory Visit (INDEPENDENT_AMBULATORY_CARE_PROVIDER_SITE_OTHER): Payer: PPO | Admitting: Internal Medicine

## 2018-06-03 ENCOUNTER — Encounter: Payer: Self-pay | Admitting: Internal Medicine

## 2018-06-03 ENCOUNTER — Ambulatory Visit: Payer: PPO | Admitting: Internal Medicine

## 2018-06-03 VITALS — BP 104/80 | HR 96 | Temp 98.6°F | Ht 65.0 in | Wt 157.0 lb

## 2018-06-03 DIAGNOSIS — R300 Dysuria: Secondary | ICD-10-CM | POA: Diagnosis not present

## 2018-06-03 DIAGNOSIS — N3001 Acute cystitis with hematuria: Secondary | ICD-10-CM

## 2018-06-03 DIAGNOSIS — M533 Sacrococcygeal disorders, not elsewhere classified: Secondary | ICD-10-CM | POA: Diagnosis not present

## 2018-06-03 DIAGNOSIS — N390 Urinary tract infection, site not specified: Secondary | ICD-10-CM | POA: Insufficient documentation

## 2018-06-03 LAB — POCT URINALYSIS DIPSTICK
Bilirubin, UA: NEGATIVE
Glucose, UA: NEGATIVE
Ketones, UA: NEGATIVE
Nitrite, UA: NEGATIVE
Protein, UA: POSITIVE — AB
Spec Grav, UA: >=1.03 — AB (ref 1.010–1.025)
Urobilinogen, UA: 0.2 E.U./dL
pH, UA: 6 (ref 5.0–8.0)

## 2018-06-03 MED ORDER — NITROFURANTOIN MONOHYD MACRO 100 MG PO CAPS: 100.0000 mg | ORAL_CAPSULE | Freq: Two times a day (BID) | ORAL | 0 refills | Status: DC

## 2018-06-03 NOTE — Patient Instructions (Signed)
We think you do have a bladder infection. Take macrobid 1 pill twice a day for 1 week.  We will have you use witch hazel wipes (tucks wipes) on the backside to help this heal. If it is still bothering you in about 1 week let us know.

## 2018-06-03 NOTE — Assessment & Plan Note (Signed)
U/A done in the office consistent with infection. Rx for macrobid.

## 2018-06-03 NOTE — Progress Notes (Signed)
   Subjective:    Patient ID: Kara Ramirez, female    DOB: Dec 27, 1950, 67 y.o.   MRN: [id number]  HPI The patient is a 67 YO female coming in for several concerns including possible UTI (burning with urination, denies fevers or chills, some suprapubic pain, denies seeing blood in urine) and bowels pain (has IBS, some constipation and then diarrhea for several days, she is having burning with going to the bathroom and also with sitting and walking, she is not using anything on this, 2 BM so far today, did have blood in some BM yesterday).   Review of Systems  Constitutional: Negative.   HENT: Negative.   Eyes: Negative.   Respiratory: Negative.   Cardiovascular: Negative.   Gastrointestinal: Positive for abdominal pain, blood in stool and rectal pain. Negative for abdominal distention, constipation, diarrhea, nausea and vomiting.  Genitourinary: Positive for dysuria, frequency and urgency.  Musculoskeletal: Negative.   Skin: Negative.   Psychiatric/Behavioral: Negative.       Objective:   Physical Exam  Constitutional: She is oriented to person, place, and time. She appears well-developed and well-nourished.  HENT:  Head: Normocephalic and atraumatic.  Eyes: EOM are normal.  Neck: Normal range of motion.  Cardiovascular: Normal rate and regular rhythm.  Pulmonary/Chest: Effort normal and breath sounds normal. No respiratory distress. She has no wheezes. She has no rales.  Abdominal: Soft. Bowel sounds are normal. She exhibits no distension. There is tenderness. There is no rebound.  Suprapubic tenderness on exam, no flank pain  Musculoskeletal: She exhibits no edema.  Neurological: She is alert and oriented to person, place, and time. Coordination normal.  Skin: Skin is warm and dry.  Psychiatric: She has a normal mood and affect.   Vitals:   06/03/18 1459  BP: 104/80  Pulse: 96  Temp: 98.6 F (37 C)  TempSrc: Oral  SpO2: 96%  Weight: 157 lb (71.2 kg)  Height: 5\' 5"   (1.651 m)      Assessment & Plan:

## 2018-06-03 NOTE — Assessment & Plan Note (Signed)
Likely hemorrhoids or small fissure. She declines rectal exam today. Advised tucks wipes or witch hazel wipes and sitz baths.

## 2018-06-04 DIAGNOSIS — S39012A Strain of muscle, fascia and tendon of lower back, initial encounter: Secondary | ICD-10-CM | POA: Diagnosis not present

## 2018-06-05 ENCOUNTER — Ambulatory Visit: Payer: PPO | Admitting: Speech Pathology

## 2018-06-09 ENCOUNTER — Ambulatory Visit
Admission: RE | Admit: 2018-06-09 | Discharge: 2018-06-09 | Disposition: A | Discharge status: Home / Self Care | Payer: PPO | Source: Ambulatory Visit | Attending: Internal Medicine | Admitting: Internal Medicine

## 2018-06-09 ENCOUNTER — Ambulatory Visit: Payer: PPO

## 2018-06-09 DIAGNOSIS — E041 Nontoxic single thyroid nodule: Secondary | ICD-10-CM

## 2018-06-09 DIAGNOSIS — M6281 Muscle weakness (generalized): Secondary | ICD-10-CM | POA: Diagnosis not present

## 2018-06-09 DIAGNOSIS — R41841 Cognitive communication deficit: Secondary | ICD-10-CM

## 2018-06-09 DIAGNOSIS — R4701 Aphasia: Secondary | ICD-10-CM
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
## 2018-06-09 NOTE — Patient Instructions (Signed)
  Please complete the assigned speech therapy homework prior to your next session and return it to the speech therapist at your next visit.  

## 2018-06-10 ENCOUNTER — Other Ambulatory Visit: Payer: Self-pay | Admitting: Internal Medicine

## 2018-06-10 ENCOUNTER — Encounter: Payer: Self-pay | Admitting: Internal Medicine

## 2018-06-10 ENCOUNTER — Ambulatory Visit (INDEPENDENT_AMBULATORY_CARE_PROVIDER_SITE_OTHER)
Admission: RE | Admit: 2018-06-10 | Discharge: 2018-06-10 | Disposition: A | Discharge status: Home / Self Care | Payer: PPO | Source: Ambulatory Visit | Attending: Internal Medicine | Admitting: Internal Medicine

## 2018-06-10 DIAGNOSIS — E042 Nontoxic multinodular goiter: Secondary | ICD-10-CM | POA: Insufficient documentation

## 2018-06-10 DIAGNOSIS — R911 Solitary pulmonary nodule: Secondary | ICD-10-CM | POA: Diagnosis not present

## 2018-06-10 DIAGNOSIS — R918 Other nonspecific abnormal finding of lung field: Secondary | ICD-10-CM

## 2018-06-11 ENCOUNTER — Ambulatory Visit: Payer: PPO

## 2018-06-11 ENCOUNTER — Encounter: Payer: Self-pay | Admitting: Gastroenterology

## 2018-06-11 ENCOUNTER — Telehealth: Payer: Self-pay | Admitting: Internal Medicine

## 2018-06-11 ENCOUNTER — Ambulatory Visit (INDEPENDENT_AMBULATORY_CARE_PROVIDER_SITE_OTHER): Payer: PPO | Admitting: Gastroenterology

## 2018-06-11 VITALS — BP 94/66 | HR 92 | Ht 65.5 in | Wt 156.4 lb

## 2018-06-11 DIAGNOSIS — K5900 Constipation, unspecified: Secondary | ICD-10-CM | POA: Diagnosis not present

## 2018-06-11 DIAGNOSIS — K6289 Other specified diseases of anus and rectum: Secondary | ICD-10-CM

## 2018-06-11 DIAGNOSIS — M6281 Muscle weakness (generalized): Secondary | ICD-10-CM | POA: Diagnosis not present

## 2018-06-11 DIAGNOSIS — R41841 Cognitive communication deficit: Secondary | ICD-10-CM

## 2018-06-11 DIAGNOSIS — R21 Rash and other nonspecific skin eruption: Secondary | ICD-10-CM | POA: Diagnosis not present

## 2018-06-11 DIAGNOSIS — R300 Dysuria: Secondary | ICD-10-CM

## 2018-06-11 DIAGNOSIS — R4701 Aphasia: Secondary | ICD-10-CM

## 2018-06-11 MED ORDER — AMBULATORY NON FORMULARY MEDICATION: 1 refills | Status: DC
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
## 2018-06-11 NOTE — Telephone Encounter (Signed)
Pt saw Dr. Havery Moros the patient today and believes she still has a UTI. She would like another medication called in and weill come back for another UA if you would like.  Please send to [facility] on file

## 2018-06-11 NOTE — Addendum Note (Signed)
Addended by: Garald Balding B on: 06/11/2018 11:48 AM   Modules accepted: Orders

## 2018-06-11 NOTE — Patient Instructions (Addendum)
If you are age 68 or older, your body mass index should be between 23-30. Your Body mass index is 25.63 kg/m. If this is out of the aforementioned range listed, please consider follow up with your Primary Care Provider.  If you are age 66 or younger, your body mass index should be between 19-25. Your Body mass index is 25.63 kg/m. If this is out of the aformentioned range listed, please consider follow up with your Primary Care Provider.   We have sent a prescription for Diltiazem gel with Lidocaine to [facility]. You should apply a pea size amount to your rectum three times daily x 6-8 weeks.  [facility]'s information is below: Address: [address], [place], [place] [address]  [phone]  *Please DO NOT go directly from our office to pick up this medication! Give the pharmacy 1 day to process the prescription as this is compounded at takes time to make.   Please purchase the following medications over the counter and take as directed: Citrucel - take daily for constipation   Thank you for entrusting me with your care and for choosing [facility], Dr. Jellico Cellar

## 2018-06-11 NOTE — Patient Instructions (Signed)
  Please complete the assigned speech therapy homework prior to your next session and return it to the speech therapist at your next visit.  
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
## 2018-06-12 ENCOUNTER — Other Ambulatory Visit (INDEPENDENT_AMBULATORY_CARE_PROVIDER_SITE_OTHER): Payer: PPO

## 2018-06-12 DIAGNOSIS — L900 Lichen sclerosus et atrophicus: Secondary | ICD-10-CM | POA: Diagnosis not present

## 2018-06-12 DIAGNOSIS — R300 Dysuria: Secondary | ICD-10-CM | POA: Diagnosis not present

## 2018-06-12 LAB — URINALYSIS, ROUTINE W REFLEX MICROSCOPIC
Bilirubin Urine: NEGATIVE
Ketones, ur: NEGATIVE
Nitrite: NEGATIVE
Specific Gravity, Urine: 1.02 (ref 1.000–1.030)
Total Protein, Urine: NEGATIVE
Urine Glucose: NEGATIVE
Urobilinogen, UA: 0.2 (ref 0.0–1.0)
pH: 6 (ref 5.0–8.0)

## 2018-06-12 NOTE — Telephone Encounter (Signed)
Needs UA and culture.

## 2018-06-12 NOTE — Telephone Encounter (Signed)
Patient ifnormed of MD response and stated understanding

## 2018-06-13 ENCOUNTER — Telehealth: Payer: Self-pay | Admitting: Internal Medicine

## 2018-06-13 DIAGNOSIS — R300 Dysuria: Secondary | ICD-10-CM

## 2018-06-13 LAB — URINE CULTURE
MICRO NUMBER:: 90881312
SPECIMEN QUALITY:: ADEQUATE

## 2018-06-13 NOTE — Telephone Encounter (Signed)
Informed patient that we are still waiting on results.

## 2018-06-13 NOTE — Telephone Encounter (Signed)
Copied from [place] [id number]. Topic: Quick Communication - See Telephone Encounter >> Jun 13, 2018  4:37 PM Bea Graff, NT wrote: CRM for notification. See Telephone encounter for: 06/13/18. Pt calling to check status of receiving her lab results.

## 2018-06-14 ENCOUNTER — Other Ambulatory Visit: Payer: Self-pay | Admitting: Internal Medicine

## 2018-06-14 DIAGNOSIS — R918 Other nonspecific abnormal finding of lung field: Secondary | ICD-10-CM

## 2018-06-16 ENCOUNTER — Ambulatory Visit: Payer: PPO

## 2018-06-16 NOTE — Telephone Encounter (Signed)
Was speaking to pt and she asked about her urine results. I did let her know there was no further infection and she states she is still hurting.  Can you please call her to discuss her concerns.  Thanks Kara Ramirez

## 2018-06-16 NOTE — Telephone Encounter (Signed)
We can recheck her urine - she can go to the lab and provide a sample or I can refer her to urology since there are other causes of pain besides an infection, but it does take a little while to get an appointment.

## 2018-06-16 NOTE — Telephone Encounter (Signed)
Referral ordered

## 2018-06-16 NOTE — Telephone Encounter (Signed)
LVM for pt to call back and discuss. CRM created

## 2018-06-16 NOTE — Telephone Encounter (Signed)
Copied from [place] [id number]. Topic: Quick Communication - Office Called Patient >> Jun 16, 2018  2:45 PM Melene Plan, CMA wrote: LVM for pt to call back and discuss.   We can recheck her urine - she can go to the lab and provide a sample or I can refer her to urology since there are other causes of pain besides an infection, but it does take a little while to get an appointment. >> Jun 16, 2018  3:37 PM Percell Belt A wrote: Pt called in would like for Dr burns to refer her to [facility]. She expressed understanding of the message.

## 2018-06-18 ENCOUNTER — Ambulatory Visit: Payer: PPO

## 2018-06-18 DIAGNOSIS — R4701 Aphasia: Secondary | ICD-10-CM

## 2018-06-18 DIAGNOSIS — M6281 Muscle weakness (generalized): Secondary | ICD-10-CM | POA: Diagnosis not present

## 2018-06-18 DIAGNOSIS — R41841 Cognitive communication deficit: Secondary | ICD-10-CM

## 2018-06-18 NOTE — Patient Instructions (Addendum)
   In order to be to [facility] by 6:30, we thought you'd need to leave at shortly after 6.  That means you will need to get up at your bathroom "wake up call" at 4:30.   ==================================================================  Please have a discussion with your husband about the schedule necessary to get here by 9:30AM on Monday, with the responsibility of your getting your granddaughter ready too.  Please ask your husband to arrive with and stay with you for one session next week. Thank you.
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
## 2018-06-20 ENCOUNTER — Ambulatory Visit (HOSPITAL_COMMUNITY)
Admission: RE | Admit: 2018-06-20 | Discharge: 2018-06-20 | Disposition: A | Discharge status: Home / Self Care | Payer: PPO | Source: Ambulatory Visit | Attending: Internal Medicine | Admitting: Internal Medicine

## 2018-06-20 DIAGNOSIS — R918 Other nonspecific abnormal finding of lung field: Secondary | ICD-10-CM

## 2018-06-20 DIAGNOSIS — I700 Atherosclerosis of aorta: Secondary | ICD-10-CM | POA: Insufficient documentation

## 2018-06-20 DIAGNOSIS — I2510 Atherosclerotic heart disease of native coronary artery without angina pectoris: Secondary | ICD-10-CM | POA: Diagnosis not present

## 2018-06-20 DIAGNOSIS — R911 Solitary pulmonary nodule: Secondary | ICD-10-CM | POA: Diagnosis not present

## 2018-06-20 LAB — GLUCOSE, CAPILLARY: Glucose-Capillary: 84 mg/dL (ref 70–99)

## 2018-06-20 MED ORDER — FLUDEOXYGLUCOSE F - 18 (FDG) INJECTION
7.6000 | Freq: Once | INTRAVENOUS | Status: AC | PRN
  Administered 2018-06-20: 7.6 via INTRAVENOUS

## 2018-06-23 ENCOUNTER — Other Ambulatory Visit: Payer: Self-pay | Admitting: Internal Medicine

## 2018-06-23 ENCOUNTER — Encounter: Payer: PPO | Admitting: Speech Pathology

## 2018-06-23 DIAGNOSIS — R911 Solitary pulmonary nodule: Secondary | ICD-10-CM

## 2018-06-25 ENCOUNTER — Ambulatory Visit: Payer: PPO | Attending: Physical Medicine & Rehabilitation

## 2018-06-25 DIAGNOSIS — R41841 Cognitive communication deficit: Secondary | ICD-10-CM

## 2018-06-25 DIAGNOSIS — R4701 Aphasia: Secondary | ICD-10-CM | POA: Diagnosis not present
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
## 2018-06-25 NOTE — Patient Instructions (Signed)
Complete your homework, and bring today's homework tomorrow.

## 2018-06-26 ENCOUNTER — Ambulatory Visit: Payer: PPO | Admitting: Speech Pathology

## 2018-06-26 DIAGNOSIS — R41841 Cognitive communication deficit: Secondary | ICD-10-CM
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
## 2018-06-27 ENCOUNTER — Encounter: Payer: Self-pay | Admitting: Pulmonary Disease

## 2018-06-27 ENCOUNTER — Ambulatory Visit (INDEPENDENT_AMBULATORY_CARE_PROVIDER_SITE_OTHER): Payer: PPO | Admitting: Pulmonary Disease

## 2018-06-27 VITALS — BP 126/76 | HR 83 | Ht 66.0 in | Wt 154.0 lb

## 2018-06-27 DIAGNOSIS — E041 Nontoxic single thyroid nodule: Secondary | ICD-10-CM | POA: Diagnosis not present

## 2018-06-27 DIAGNOSIS — J4520 Mild intermittent asthma, uncomplicated: Secondary | ICD-10-CM

## 2018-06-27 DIAGNOSIS — R911 Solitary pulmonary nodule: Secondary | ICD-10-CM | POA: Diagnosis not present

## 2018-06-27 DIAGNOSIS — J309 Allergic rhinitis, unspecified: Secondary | ICD-10-CM

## 2018-06-27 NOTE — Patient Instructions (Addendum)
We will send you to see one of our cardiothoracic surgery colleagues Dr. Roxan Hockey for a surgical evaluation to potentially remove the lung nodule found on your pet imaging. We will need to obtain pulmonary function tests 6-minute walk. Please contact our office with any questions.

## 2018-06-30 ENCOUNTER — Ambulatory Visit: Payer: PPO

## 2018-06-30 DIAGNOSIS — R4701 Aphasia: Secondary | ICD-10-CM

## 2018-06-30 DIAGNOSIS — R41841 Cognitive communication deficit: Secondary | ICD-10-CM

## 2018-06-30 NOTE — Patient Instructions (Signed)
"  Brain game" puzzle books   [email or web address]  Checkers and chess, backgammon  Card games - [place], [place]

## 2018-07-01 ENCOUNTER — Telehealth: Payer: Self-pay | Admitting: Internal Medicine
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
## 2018-07-01 NOTE — Telephone Encounter (Signed)
Copied from [facility] [id number]. Topic: Quick Communication - See Telephone Encounter >> Jul 01, 2018 11:17 AM Kara Ramirez, NT wrote: CRM for notification. See Telephone encounter for: 07/01/18. Pt states that when she saw Dr. 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 She states back in 2011 she had to have her back surgery cancelled and was told not to have 2 major surgeries in one year and she is wanting to be sure she will be ok to have the nodule removed since she had the brain surgery earlier this year. She really would like Dr. Quay Burow input on what she advises.

## 2018-07-01 NOTE — Telephone Encounter (Signed)
Pt aware of response below.  

## 2018-07-01 NOTE — Telephone Encounter (Signed)
Yes, ideally she wants to avoid surgery, but it is safe for her to have surgery, especially since there is a risk of cancer.

## 2018-07-02 ENCOUNTER — Ambulatory Visit: Payer: PPO

## 2018-07-07 ENCOUNTER — Encounter: Payer: PPO | Admitting: Speech Pathology

## 2018-07-08 ENCOUNTER — Ambulatory Visit (INDEPENDENT_AMBULATORY_CARE_PROVIDER_SITE_OTHER): Payer: PPO | Admitting: Pulmonary Disease

## 2018-07-08 ENCOUNTER — Telehealth: Payer: Self-pay | Admitting: Pulmonary Disease

## 2018-07-08 DIAGNOSIS — R911 Solitary pulmonary nodule: Secondary | ICD-10-CM

## 2018-07-08 LAB — PULMONARY FUNCTION TEST
DL/VA % pred: 83 %
DL/VA: 4.05 ml/min/mmHg/L
DLCO unc % pred: 91 %
DLCO unc: 22.5 ml/min/mmHg
FEF 25-75 Post: 2.01 L/sec
FEF 25-75 Pre: 2.33 L/sec
FEF2575-%Change-Post: -14 %
FEF2575-%Pred-Post: 100 %
FEF2575-%Pred-Pre: 116 %
FEV1-%Change-Post: -3 %
FEV1-%Pred-Post: 103 %
FEV1-%Pred-Pre: 107 %
FEV1-Post: 2.44 L
FEV1-Pre: 2.52 L
FEV1FVC-%Change-Post: 0 %
FEV1FVC-%Pred-Pre: 102 %
FEV6-%Change-Post: -3 %
FEV6-%Pred-Post: 104 %
FEV6-%Pred-Pre: 108 %
FEV6-Post: 3.1 L
FEV6-Pre: 3.2 L
FEV6FVC-%Change-Post: 0 %
FEV6FVC-%Pred-Post: 104 %
FEV6FVC-%Pred-Pre: 103 %
FVC-%Change-Post: -3 %
FVC-%Pred-Post: 100 %
FVC-%Pred-Pre: 104 %
FVC-Post: 3.1 L
FVC-Pre: 3.23 L
Post FEV1/FVC ratio: 79 %
Post FEV6/FVC ratio: 100 %
Pre FEV1/FVC ratio: 78 %
Pre FEV6/FVC Ratio: 99 %
RV % pred: 166 %
RV: 3.6 L
TLC % pred: 135 %
TLC: 6.91 L

## 2018-07-08 NOTE — Progress Notes (Signed)
PFT done today. 
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
## 2018-07-08 NOTE — Telephone Encounter (Signed)
Called and spoke with patient regarding PFT testing today Pt is requesting results from [place] pt that once results are available we will contact her with results Pt verbalized understanding Nothing further needed.

## 2018-07-18 DIAGNOSIS — H538 Other visual disturbances: Secondary | ICD-10-CM | POA: Diagnosis not present

## 2018-07-18 DIAGNOSIS — H4912 Fourth [trochlear] nerve palsy, left eye: Secondary | ICD-10-CM | POA: Diagnosis not present

## 2018-07-18 DIAGNOSIS — S069X9A Unspecified intracranial injury with loss of consciousness of unspecified duration, initial encounter: Secondary | ICD-10-CM | POA: Diagnosis not present

## 2018-07-18 DIAGNOSIS — H532 Diplopia: Secondary | ICD-10-CM | POA: Diagnosis not present

## 2018-07-20 LAB — HM DEXA SCAN

## 2018-07-28 ENCOUNTER — Encounter: Payer: Self-pay | Admitting: Thoracic Surgery (Cardiothoracic Vascular Surgery)

## 2018-07-28 ENCOUNTER — Institutional Professional Consult (permissible substitution): Payer: PPO | Admitting: Thoracic Surgery (Cardiothoracic Vascular Surgery)

## 2018-07-28 VITALS — BP 126/85 | HR 85 | Resp 20 | Ht 66.0 in | Wt 133.0 lb

## 2018-07-28 DIAGNOSIS — R911 Solitary pulmonary nodule: Secondary | ICD-10-CM
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
## 2018-07-29 ENCOUNTER — Encounter: Payer: Self-pay | Admitting: *Deleted

## 2018-07-29 ENCOUNTER — Other Ambulatory Visit: Payer: Self-pay | Admitting: *Deleted

## 2018-07-29 DIAGNOSIS — R911 Solitary pulmonary nodule: Secondary | ICD-10-CM

## 2018-07-30 DIAGNOSIS — R350 Frequency of micturition: Secondary | ICD-10-CM | POA: Diagnosis not present

## 2018-07-30 DIAGNOSIS — N952 Postmenopausal atrophic vaginitis: Secondary | ICD-10-CM | POA: Diagnosis not present

## 2018-07-30 DIAGNOSIS — N815 Vaginal enterocele: Secondary | ICD-10-CM | POA: Diagnosis not present

## 2018-07-31 ENCOUNTER — Telehealth: Payer: Self-pay | Admitting: Internal Medicine

## 2018-07-31 NOTE — Telephone Encounter (Signed)
Ok to take together, but BP and HR needs to be monitored - the combination can increase effects of metoprolol and lower BP and HR

## 2018-07-31 NOTE — Telephone Encounter (Signed)
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

## 2018-07-31 NOTE — Telephone Encounter (Signed)
Pt aware.

## 2018-07-31 NOTE — Telephone Encounter (Signed)
Copied from [place] [id number]. Topic: Quick Communication - See Telephone Encounter >> Jul 31, 2018 10:45 AM Synthia Innocent wrote: CRM for notification. See Telephone encounter for: 07/31/18. Urologist has placed patient on myrbetriq, was told to contact PCP due to being on metoprolol. Ok to take together?

## 2018-07-31 NOTE — Telephone Encounter (Signed)
Pt aware of response below. She also wanted to know if she is able to take metoprolol before her surgery which is having a lung nodule removed on the 23rd.

## 2018-08-06 DIAGNOSIS — S39012A Strain of muscle, fascia and tendon of lower back, initial encounter: Secondary | ICD-10-CM | POA: Diagnosis not present

## 2018-08-06 NOTE — Progress Notes (Addendum)
PCP: Billey Gosling, MD  Cardiologist: Quay Burow, MD  EKG: obtain at PAT   Stress test: 06/17/15 in EPIC  ECHO: 08/08/17 in EPIC  Cardiac Cath: pt denies  Chest x-ray: will need to obtain day of surgery per order

## 2018-08-06 NOTE — Pre-Procedure Instructions (Signed)
Kara Ramirez  08/06/2018      [place], [place] [address] [place] [address] Phone: [phone] Fax: [phone]  [place], [place] [address] Phone: [phone] Fax: [phone]    Your procedure is scheduled on August 11, 2018.  Report to [facility] Admitting at 530 AM.  Call this number if you have problems the morning of surgery:  [phone]   Remember:  Do not eat or drink after midnight.   Take these medicines the morning of surgery with A SIP OF WATER  Metoprolol succinate Toprol XL Tylenol-if needed Albuterol inhaler-if needed-bring inhaler with you flonase nasal spray-if needed Ipratropium nasal spray-if needed Zaditor eye drops-if needed Loratadine (claritin) [place] nasal spray-if needed  7 days prior to surgery STOP taking any Aspirin (unless otherwise instructed by your surgeon), Aleve, Naproxen, Ibuprofen, Motrin, Advil, Goody's, BC's, all herbal medications, fish oil, and all vitamins     [place]- Preparing For Surgery  Before surgery, you can play an important role. Because skin is not sterile, your skin needs to be as free of germs as possible. You can reduce the number of germs on your skin by washing with CHG (chlorahexidine gluconate) Soap before surgery.  CHG is an antiseptic cleaner which kills germs and bonds with the skin to continue killing germs even after washing.    Oral Hygiene is also important to reduce your risk of infection.  Remember - BRUSH YOUR TEETH THE MORNING OF SURGERY WITH YOUR REGULAR TOOTHPASTE  Please do not use if you have an allergy to CHG or antibacterial soaps. If your skin becomes reddened/irritated stop using the CHG.  Do not shave (including legs and underarms) for at least 48 hours prior to first CHG shower. It is OK to shave your face.  Please follow  these instructions carefully.   1. Shower the NIGHT BEFORE SURGERY and the MORNING OF SURGERY with CHG.   2. If you chose to wash your hair, wash your hair first as usual with your normal shampoo.  3. After you shampoo, rinse your hair and body thoroughly to remove the shampoo.  4. Use CHG as you would any other liquid soap. You can apply CHG directly to the skin and wash gently with a scrungie or a clean washcloth.   5. Apply the CHG Soap to your body ONLY FROM THE NECK DOWN.  Do not use on open wounds or open sores. Avoid contact with your eyes, ears, mouth and genitals (private parts). Wash Face and genitals (private parts)  with your normal soap.  6. Wash thoroughly, paying special attention to the area where your surgery will be performed.  7. Thoroughly rinse your body with warm water from the neck down.  8. DO NOT shower/wash with your normal soap after using and rinsing off the CHG Soap.  9. Pat yourself dry with a CLEAN TOWEL.  10. Wear CLEAN PAJAMAS to bed the night before surgery, wear comfortable clothes the morning of surgery  11. Place CLEAN SHEETS on your bed the night of your first shower and DO NOT SLEEP WITH PETS.  Day of Surgery:  Do not apply any deodorants/lotions.  Please wear clean clothes to the hospital/surgery center.   Remember to brush your teeth WITH YOUR REGULAR TOOTHPASTE.   Do not wear jewelry, make-up or nail polish.  Do not  wear lotions, powders, or perfumes, or deodorant.  Do not shave 48 hours prior to surgery.    Do not bring valuables to the hospital.   [facility] is not responsible for any belongings or valuables.  Contacts, dentures or bridgework may not be worn into surgery.  Leave your suitcase in the car.  After surgery it may be brought to your room.  For patients admitted to the hospital, discharge time will be determined by your treatment team.  Patients discharged the day of surgery will not be allowed to drive home.   Please  read over the following fact sheets that you were given.

## 2018-08-07 ENCOUNTER — Encounter (HOSPITAL_COMMUNITY)
Admission: RE | Admit: 2018-08-07 | Discharge: 2018-08-07 | Disposition: A | Discharge status: Home / Self Care | Payer: PPO | Source: Ambulatory Visit | Attending: Thoracic Surgery (Cardiothoracic Vascular Surgery) | Admitting: Thoracic Surgery (Cardiothoracic Vascular Surgery)

## 2018-08-07 ENCOUNTER — Other Ambulatory Visit: Payer: Self-pay

## 2018-08-07 ENCOUNTER — Encounter (HOSPITAL_COMMUNITY): Payer: Self-pay

## 2018-08-07 DIAGNOSIS — I480 Paroxysmal atrial fibrillation: Secondary | ICD-10-CM | POA: Insufficient documentation

## 2018-08-07 DIAGNOSIS — D649 Anemia, unspecified: Secondary | ICD-10-CM | POA: Insufficient documentation

## 2018-08-07 DIAGNOSIS — Z01812 Encounter for preprocedural laboratory examination: Secondary | ICD-10-CM | POA: Insufficient documentation

## 2018-08-07 DIAGNOSIS — Z79899 Other long term (current) drug therapy: Secondary | ICD-10-CM | POA: Diagnosis not present

## 2018-08-07 DIAGNOSIS — E785 Hyperlipidemia, unspecified: Secondary | ICD-10-CM | POA: Diagnosis not present

## 2018-08-07 DIAGNOSIS — J45909 Unspecified asthma, uncomplicated: Secondary | ICD-10-CM | POA: Diagnosis not present

## 2018-08-07 DIAGNOSIS — R911 Solitary pulmonary nodule: Secondary | ICD-10-CM

## 2018-08-07 DIAGNOSIS — K589 Irritable bowel syndrome without diarrhea: Secondary | ICD-10-CM | POA: Insufficient documentation

## 2018-08-07 HISTORY — DX: Other specified postprocedural states: R11.2

## 2018-08-07 HISTORY — DX: Aneurysm of other specified arteries: I72.8

## 2018-08-07 HISTORY — DX: Other complications of anesthesia, initial encounter: T88.59XA

## 2018-08-07 HISTORY — DX: Adverse effect of unspecified anesthetic, initial encounter: T41.45XA

## 2018-08-07 HISTORY — DX: Other specified postprocedural states: Z98.890

## 2018-08-07 HISTORY — DX: Nontraumatic subarachnoid hemorrhage, unspecified: I60.9

## 2018-08-07 LAB — URINALYSIS, ROUTINE W REFLEX MICROSCOPIC
Bacteria, UA: NONE SEEN
Bilirubin Urine: NEGATIVE
Glucose, UA: NEGATIVE mg/dL
Ketones, ur: NEGATIVE mg/dL
Nitrite: NEGATIVE
Protein, ur: NEGATIVE mg/dL
Specific Gravity, Urine: 1.027 (ref 1.005–1.030)
pH: 5 (ref 5.0–8.0)

## 2018-08-07 LAB — BLOOD GAS, ARTERIAL
Acid-base deficit: 0.3 mmol/L (ref 0.0–2.0)
Bicarbonate: 23.1 mmol/L (ref 20.0–28.0)
Drawn by: 421801
FIO2: 21
O2 Saturation: 98.1 %
Patient temperature: 98.6
pCO2 arterial: 33.5 mmHg (ref 32.0–48.0)
pH, Arterial: 7.453 — ABNORMAL HIGH (ref 7.350–7.450)
pO2, Arterial: 109 mmHg — ABNORMAL HIGH (ref 83.0–108.0)

## 2018-08-07 LAB — TYPE AND SCREEN
ABO/RH(D): A POS
Antibody Screen: NEGATIVE

## 2018-08-07 LAB — CBC
HCT: 36.8 % (ref 36.0–46.0)
Hemoglobin: 11.8 g/dL — ABNORMAL LOW (ref 12.0–15.0)
MCH: 28.7 pg (ref 26.0–34.0)
MCHC: 32.1 g/dL (ref 30.0–36.0)
MCV: 89.5 fL (ref 78.0–100.0)
Platelets: 193 10*3/uL (ref 150–400)
RBC: 4.11 MIL/uL (ref 3.87–5.11)
RDW: 13.1 % (ref 11.5–15.5)
WBC: 7.6 10*3/uL (ref 4.0–10.5)

## 2018-08-07 LAB — COMPREHENSIVE METABOLIC PANEL
ALT: 23 U/L (ref 0–44)
AST: 23 U/L (ref 15–41)
Albumin: 3.8 g/dL (ref 3.5–5.0)
Alkaline Phosphatase: 48 U/L (ref 38–126)
Anion gap: 9 (ref 5–15)
BUN: 25 mg/dL — ABNORMAL HIGH (ref 8–23)
CO2: 21 mmol/L — ABNORMAL LOW (ref 22–32)
Calcium: 9.1 mg/dL (ref 8.9–10.3)
Chloride: 108 mmol/L (ref 98–111)
Creatinine, Ser: 0.74 mg/dL (ref 0.44–1.00)
GFR calc Af Amer: >60 mL/min (ref >60)
GFR calc non Af Amer: >60 mL/min (ref >60)
Glucose, Bld: 95 mg/dL (ref 70–99)
Potassium: 3.9 mmol/L (ref 3.5–5.1)
Sodium: 138 mmol/L (ref 135–145)
Total Bilirubin: 0.6 mg/dL (ref 0.3–1.2)
Total Protein: 6.5 g/dL (ref 6.5–8.1)

## 2018-08-07 LAB — APTT: aPTT: 29 seconds (ref 24–36)

## 2018-08-07 LAB — SURGICAL PCR SCREEN
MRSA, PCR: NEGATIVE
Staphylococcus aureus: NEGATIVE

## 2018-08-07 LAB — PROTIME-INR
INR: 1
Prothrombin Time: 13.1 seconds (ref 11.4–15.2)

## 2018-08-08 ENCOUNTER — Encounter (HOSPITAL_COMMUNITY): Payer: Self-pay

## 2018-08-08 ENCOUNTER — Ambulatory Visit: Payer: PPO
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
## 2018-08-10 NOTE — Anesthesia Preprocedure Evaluation (Addendum)
Anesthesia Evaluation  Patient identified by MRN, date of birth, ID band Patient awake    Reviewed: Allergy & Precautions, H&P , NPO status , Patient's Chart, lab work & pertinent test results, reviewed documented beta blocker date and time   History of Anesthesia Complications (+) PONV  Airway Mallampati: II  TM Distance: >3 FB Neck ROM: Full    Dental no notable dental hx. (+) Teeth Intact, Dental Advisory Given   Pulmonary asthma ,    Pulmonary exam normal breath sounds clear to auscultation       Cardiovascular Exercise Tolerance: Good + Peripheral Vascular Disease  negative cardio ROS  + dysrhythmias Atrial Fibrillation  Rhythm:Regular Rate:Normal     Neuro/Psych  Headaches, negative psych ROS   GI/Hepatic negative GI ROS, Neg liver ROS,   Endo/Other  negative endocrine ROS  Renal/GU negative Renal ROS  negative genitourinary   Musculoskeletal  (+) Arthritis , Fibromyalgia -  Abdominal   Peds  Hematology negative hematology ROS (+) anemia ,   Anesthesia Other Findings   Reproductive/Obstetrics negative OB ROS                            Anesthesia Physical Anesthesia Plan  ASA: III  Anesthesia Plan: General   Post-op Pain Management:    Induction: Intravenous  PONV Risk Score and Plan: 4 or greater and Ondansetron, Dexamethasone and Midazolam  Airway Management Planned: Double Lumen EBT  Additional Equipment: Arterial line and CVP  Intra-op Plan:   Post-operative Plan: Extubation in OR  Informed Consent: I have reviewed the patients History and Physical, chart, labs and discussed the procedure including the risks, benefits and alternatives for the proposed anesthesia with the patient or authorized representative who has indicated his/her understanding and acceptance.   Dental advisory given  Plan Discussed with: CRNA  Anesthesia Plan Comments:          Anesthesia Quick Evaluation

## 2018-08-11 ENCOUNTER — Inpatient Hospital Stay (HOSPITAL_COMMUNITY): Payer: PPO | Admitting: Certified Registered"

## 2018-08-11 ENCOUNTER — Inpatient Hospital Stay (HOSPITAL_COMMUNITY): Payer: PPO | Admitting: Vascular Surgery

## 2018-08-11 ENCOUNTER — Other Ambulatory Visit: Payer: Self-pay

## 2018-08-11 ENCOUNTER — Inpatient Hospital Stay (HOSPITAL_COMMUNITY)
Admission: RE | Admit: 2018-08-11 | Discharge: 2018-08-15 | DRG: 164 | Disposition: A | Discharge status: Home / Self Care | Payer: PPO | Attending: Thoracic Surgery (Cardiothoracic Vascular Surgery) | Admitting: Thoracic Surgery (Cardiothoracic Vascular Surgery)

## 2018-08-11 ENCOUNTER — Inpatient Hospital Stay (HOSPITAL_COMMUNITY): Payer: PPO

## 2018-08-11 ENCOUNTER — Ambulatory Visit: Payer: PPO | Admitting: Pulmonary Disease

## 2018-08-11 ENCOUNTER — Encounter (HOSPITAL_COMMUNITY): Payer: Self-pay | Admitting: *Deleted

## 2018-08-11 ENCOUNTER — Encounter (HOSPITAL_COMMUNITY)
Admission: RE | Disposition: A | Discharge status: Home / Self Care | Payer: Self-pay | Source: Home / Self Care | Attending: Thoracic Surgery (Cardiothoracic Vascular Surgery)

## 2018-08-11 DIAGNOSIS — Z9889 Other specified postprocedural states: Secondary | ICD-10-CM

## 2018-08-11 DIAGNOSIS — R911 Solitary pulmonary nodule: Secondary | ICD-10-CM | POA: Diagnosis present

## 2018-08-11 DIAGNOSIS — Z8249 Family history of ischemic heart disease and other diseases of the circulatory system: Secondary | ICD-10-CM

## 2018-08-11 DIAGNOSIS — J939 Pneumothorax, unspecified: Secondary | ICD-10-CM | POA: Diagnosis not present

## 2018-08-11 DIAGNOSIS — Z888 Allergy status to other drugs, medicaments and biological substances status: Secondary | ICD-10-CM

## 2018-08-11 DIAGNOSIS — Z91040 Latex allergy status: Secondary | ICD-10-CM | POA: Diagnosis not present

## 2018-08-11 DIAGNOSIS — Z880 Allergy status to penicillin: Secondary | ICD-10-CM | POA: Diagnosis not present

## 2018-08-11 DIAGNOSIS — I4891 Unspecified atrial fibrillation: Secondary | ICD-10-CM | POA: Clinically undetermined

## 2018-08-11 DIAGNOSIS — J9811 Atelectasis: Secondary | ICD-10-CM | POA: Diagnosis present

## 2018-08-11 DIAGNOSIS — Z90710 Acquired absence of both cervix and uterus: Secondary | ICD-10-CM

## 2018-08-11 DIAGNOSIS — Z91018 Allergy to other foods: Secondary | ICD-10-CM

## 2018-08-11 DIAGNOSIS — Z862 Personal history of diseases of the blood and blood-forming organs and certain disorders involving the immune mechanism: Secondary | ICD-10-CM

## 2018-08-11 DIAGNOSIS — Z4682 Encounter for fitting and adjustment of non-vascular catheter: Secondary | ICD-10-CM | POA: Diagnosis not present

## 2018-08-11 DIAGNOSIS — Z7951 Long term (current) use of inhaled steroids: Secondary | ICD-10-CM

## 2018-08-11 DIAGNOSIS — Z8261 Family history of arthritis: Secondary | ICD-10-CM

## 2018-08-11 DIAGNOSIS — R4701 Aphasia: Secondary | ICD-10-CM | POA: Diagnosis present

## 2018-08-11 DIAGNOSIS — I9789 Other postprocedural complications and disorders of the circulatory system, not elsewhere classified: Secondary | ICD-10-CM | POA: Diagnosis not present

## 2018-08-11 DIAGNOSIS — Z8679 Personal history of other diseases of the circulatory system: Secondary | ICD-10-CM | POA: Diagnosis not present

## 2018-08-11 DIAGNOSIS — Z885 Allergy status to narcotic agent status: Secondary | ICD-10-CM | POA: Diagnosis not present

## 2018-08-11 DIAGNOSIS — J9383 Other pneumothorax: Secondary | ICD-10-CM | POA: Diagnosis not present

## 2018-08-11 DIAGNOSIS — Z91012 Allergy to eggs: Secondary | ICD-10-CM | POA: Diagnosis not present

## 2018-08-11 DIAGNOSIS — M1990 Unspecified osteoarthritis, unspecified site: Secondary | ICD-10-CM | POA: Diagnosis present

## 2018-08-11 DIAGNOSIS — I700 Atherosclerosis of aorta: Secondary | ICD-10-CM | POA: Diagnosis not present

## 2018-08-11 DIAGNOSIS — J45909 Unspecified asthma, uncomplicated: Secondary | ICD-10-CM | POA: Diagnosis not present

## 2018-08-11 DIAGNOSIS — Z791 Long term (current) use of non-steroidal anti-inflammatories (NSAID): Secondary | ICD-10-CM

## 2018-08-11 DIAGNOSIS — I2510 Atherosclerotic heart disease of native coronary artery without angina pectoris: Secondary | ICD-10-CM | POA: Diagnosis not present

## 2018-08-11 DIAGNOSIS — J850 Gangrene and necrosis of lung: Secondary | ICD-10-CM | POA: Diagnosis not present

## 2018-08-11 DIAGNOSIS — Z91010 Allergy to peanuts: Secondary | ICD-10-CM | POA: Diagnosis not present

## 2018-08-11 DIAGNOSIS — E041 Nontoxic single thyroid nodule: Secondary | ICD-10-CM | POA: Diagnosis not present

## 2018-08-11 DIAGNOSIS — K589 Irritable bowel syndrome without diarrhea: Secondary | ICD-10-CM | POA: Diagnosis not present

## 2018-08-11 DIAGNOSIS — I480 Paroxysmal atrial fibrillation: Secondary | ICD-10-CM | POA: Diagnosis not present

## 2018-08-11 DIAGNOSIS — Z825 Family history of asthma and other chronic lower respiratory diseases: Secondary | ICD-10-CM

## 2018-08-11 DIAGNOSIS — J8410 Pulmonary fibrosis, unspecified: Secondary | ICD-10-CM | POA: Diagnosis present

## 2018-08-11 DIAGNOSIS — Z79899 Other long term (current) drug therapy: Secondary | ICD-10-CM

## 2018-08-11 DIAGNOSIS — Z800 Family history of malignant neoplasm of digestive organs: Secondary | ICD-10-CM

## 2018-08-11 DIAGNOSIS — M797 Fibromyalgia: Secondary | ICD-10-CM | POA: Diagnosis present

## 2018-08-11 DIAGNOSIS — K5790 Diverticulosis of intestine, part unspecified, without perforation or abscess without bleeding: Secondary | ICD-10-CM | POA: Diagnosis present

## 2018-08-11 DIAGNOSIS — E785 Hyperlipidemia, unspecified: Secondary | ICD-10-CM | POA: Diagnosis present

## 2018-08-11 DIAGNOSIS — J302 Other seasonal allergic rhinitis: Secondary | ICD-10-CM

## 2018-08-11 DIAGNOSIS — J3089 Other allergic rhinitis: Secondary | ICD-10-CM

## 2018-08-11 DIAGNOSIS — R918 Other nonspecific abnormal finding of lung field: Secondary | ICD-10-CM | POA: Diagnosis not present

## 2018-08-11 DIAGNOSIS — Z87442 Personal history of urinary calculi: Secondary | ICD-10-CM

## 2018-08-11 HISTORY — PX: VIDEO ASSISTED THORACOSCOPY (VATS)/WEDGE RESECTION: SHX6174

## 2018-08-11 HISTORY — PX: CHEST TUBE INSERTION: SHX231

## 2018-08-11 LAB — GLUCOSE, CAPILLARY
Glucose-Capillary: 108 mg/dL — ABNORMAL HIGH (ref 70–99)
Glucose-Capillary: 108 mg/dL — ABNORMAL HIGH (ref 70–99)
Glucose-Capillary: 101 mg/dL — ABNORMAL HIGH (ref 70–99)

## 2018-08-11 SURGERY — VIDEO ASSISTED THORACOSCOPY (VATS)/WEDGE RESECTION
Anesthesia: General | Site: Chest | Laterality: Left

## 2018-08-11 MED ORDER — ACETAMINOPHEN 160 MG/5ML PO SOLN: 1000.0000 mg | Freq: Four times a day (QID) | ORAL | Status: DC

## 2018-08-11 MED ORDER — POTASSIUM CHLORIDE 10 MEQ/50ML IV SOLN
10.0000 meq | Freq: Every day | INTRAVENOUS | Status: DC | PRN
  Administered 2018-08-12 – 2018-08-13 (×3): 10 meq via INTRAVENOUS
  Filled 2018-08-11 (×4): qty 50

## 2018-08-11 MED ORDER — BUPIVACAINE LIPOSOME 1.3 % IJ SUSP
20.0000 mL | INTRAMUSCULAR | Status: AC
  Administered 2018-08-11: 20 mL
  Filled 2018-08-11: qty 20

## 2018-08-11 MED ORDER — FENTANYL CITRATE (PF) 100 MCG/2ML IJ SOLN: 25.0000 ug | INTRAMUSCULAR | Status: DC | PRN

## 2018-08-11 MED ORDER — PROPOFOL 10 MG/ML IV BOLUS
INTRAVENOUS | Status: DC | PRN
  Administered 2018-08-11: 120 mg via INTRAVENOUS

## 2018-08-11 MED ORDER — ONDANSETRON HCL 4 MG/2ML IJ SOLN
4.0000 mg | Freq: Four times a day (QID) | INTRAMUSCULAR | Status: DC | PRN
  Administered 2018-08-11: 4 mg via INTRAVENOUS
  Filled 2018-08-11: qty 2

## 2018-08-11 MED ORDER — SODIUM CHLORIDE 0.9 % IV SOLN
INTRAVENOUS | Status: DC
  Administered 2018-08-11: 100 mL/h via INTRAVENOUS
  Administered 2018-08-12 – 2018-08-13 (×2): via INTRAVENOUS

## 2018-08-11 MED ORDER — DIPHENHYDRAMINE HCL 12.5 MG/5ML PO ELIX
12.5000 mg | ORAL_SOLUTION | Freq: Four times a day (QID) | ORAL | Status: DC | PRN
  Administered 2018-08-12: 12.5 mg via ORAL
  Filled 2018-08-11 (×2): qty 5

## 2018-08-11 MED ORDER — ENOXAPARIN SODIUM 40 MG/0.4ML ~~LOC~~ SOLN
40.0000 mg | Freq: Every day | SUBCUTANEOUS | Status: DC
  Administered 2018-08-11 – 2018-08-14 (×4): 40 mg via SUBCUTANEOUS
  Filled 2018-08-11 (×4): qty 0.4

## 2018-08-11 MED ORDER — VANCOMYCIN HCL IN DEXTROSE 1-5 GM/200ML-% IV SOLN
1000.0000 mg | Freq: Two times a day (BID) | INTRAVENOUS | Status: AC
  Administered 2018-08-11: 1000 mg via INTRAVENOUS
  Filled 2018-08-11: qty 200

## 2018-08-11 MED ORDER — NALOXONE HCL 0.4 MG/ML IJ SOLN: 0.4000 mg | INTRAMUSCULAR | Status: DC | PRN

## 2018-08-11 MED ORDER — 0.9 % SODIUM CHLORIDE (POUR BTL) OPTIME
TOPICAL | Status: DC | PRN
  Administered 2018-08-11: 3000 mL

## 2018-08-11 MED ORDER — ROCURONIUM BROMIDE 10 MG/ML (PF) SYRINGE
PREFILLED_SYRINGE | INTRAVENOUS | Status: DC | PRN
  Administered 2018-08-11: 50 mg via INTRAVENOUS
  Administered 2018-08-11: 20 mg via INTRAVENOUS

## 2018-08-11 MED ORDER — SODIUM CHLORIDE 0.9% FLUSH: 9.0000 mL | INTRAVENOUS | Status: DC | PRN

## 2018-08-11 MED ORDER — FENTANYL 40 MCG/ML IV SOLN
INTRAVENOUS | Status: DC
  Administered 2018-08-11: 1000 ug via INTRAVENOUS
  Administered 2018-08-11: 0 ug via INTRAVENOUS
  Administered 2018-08-11: 30 ug via INTRAVENOUS
  Administered 2018-08-11: 15 ug via INTRAVENOUS
  Administered 2018-08-12: 0 ug via INTRAVENOUS
  Administered 2018-08-12: 15 ug via INTRAVENOUS
  Administered 2018-08-12 – 2018-08-13 (×2): 0 ug via INTRAVENOUS
  Filled 2018-08-11 (×2): qty 25

## 2018-08-11 MED ORDER — HYDROMORPHONE HCL 1 MG/ML IJ SOLN
INTRAMUSCULAR | Status: AC
  Administered 2018-08-11: 0.25 mg via INTRAVENOUS
  Filled 2018-08-11: qty 1

## 2018-08-11 MED ORDER — PHENYLEPHRINE 40 MCG/ML (10ML) SYRINGE FOR IV PUSH (FOR BLOOD PRESSURE SUPPORT)
PREFILLED_SYRINGE | INTRAVENOUS | Status: AC
  Filled 2018-08-11: qty 10

## 2018-08-11 MED ORDER — OXYCODONE HCL 5 MG PO TABS: 5.0000 mg | ORAL_TABLET | ORAL | Status: DC | PRN

## 2018-08-11 MED ORDER — TRAMADOL HCL 50 MG PO TABS: 50.0000 mg | ORAL_TABLET | Freq: Four times a day (QID) | ORAL | Status: DC | PRN

## 2018-08-11 MED ORDER — ACETAMINOPHEN 500 MG PO TABS
1000.0000 mg | ORAL_TABLET | Freq: Four times a day (QID) | ORAL | Status: DC
  Administered 2018-08-11 (×2): 1000 mg via ORAL
  Filled 2018-08-11 (×2): qty 2

## 2018-08-11 MED ORDER — BUPIVACAINE HCL (PF) 0.5 % IJ SOLN
INTRAMUSCULAR | Status: AC
  Filled 2018-08-11: qty 30

## 2018-08-11 MED ORDER — PHENYLEPHRINE 40 MCG/ML (10ML) SYRINGE FOR IV PUSH (FOR BLOOD PRESSURE SUPPORT)
PREFILLED_SYRINGE | INTRAVENOUS | Status: DC | PRN
  Administered 2018-08-11: 40 ug via INTRAVENOUS
  Administered 2018-08-11: 80 ug via INTRAVENOUS

## 2018-08-11 MED ORDER — LEVALBUTEROL HCL 0.63 MG/3ML IN NEBU: 0.6300 mg | INHALATION_SOLUTION | Freq: Four times a day (QID) | RESPIRATORY_TRACT | Status: DC | PRN

## 2018-08-11 MED ORDER — ONDANSETRON HCL 4 MG/2ML IJ SOLN
INTRAMUSCULAR | Status: DC | PRN
  Administered 2018-08-11: 4 mg via INTRAVENOUS

## 2018-08-11 MED ORDER — FENTANYL CITRATE (PF) 100 MCG/2ML IJ SOLN
INTRAMUSCULAR | Status: DC | PRN
  Administered 2018-08-11 (×2): 50 ug via INTRAVENOUS

## 2018-08-11 MED ORDER — PROMETHAZINE HCL 25 MG/ML IJ SOLN
12.5000 mg | Freq: Four times a day (QID) | INTRAMUSCULAR | Status: DC | PRN
  Administered 2018-08-11: 12.5 mg via INTRAVENOUS
  Filled 2018-08-11: qty 1

## 2018-08-11 MED ORDER — LIDOCAINE 2% (20 MG/ML) 5 ML SYRINGE
INTRAMUSCULAR | Status: AC
  Filled 2018-08-11: qty 5

## 2018-08-11 MED ORDER — LACTATED RINGERS IV SOLN
INTRAVENOUS | Status: DC | PRN
  Administered 2018-08-11: 08:00:00 via INTRAVENOUS

## 2018-08-11 MED ORDER — SUGAMMADEX SODIUM 200 MG/2ML IV SOLN
INTRAVENOUS | Status: DC | PRN
  Administered 2018-08-11: 150 mg via INTRAVENOUS

## 2018-08-11 MED ORDER — MIDAZOLAM HCL 2 MG/2ML IJ SOLN
INTRAMUSCULAR | Status: AC
  Filled 2018-08-11: qty 2

## 2018-08-11 MED ORDER — DEXAMETHASONE SODIUM PHOSPHATE 10 MG/ML IJ SOLN
INTRAMUSCULAR | Status: DC | PRN
  Administered 2018-08-11: 10 mg via INTRAVENOUS

## 2018-08-11 MED ORDER — PROPOFOL 10 MG/ML IV BOLUS
INTRAVENOUS | Status: AC
  Filled 2018-08-11: qty 20

## 2018-08-11 MED ORDER — HYDROMORPHONE HCL 1 MG/ML IJ SOLN
0.2500 mg | INTRAMUSCULAR | Status: DC | PRN
  Administered 2018-08-11 (×2): 0.25 mg via INTRAVENOUS

## 2018-08-11 MED ORDER — ROCURONIUM BROMIDE 50 MG/5ML IV SOSY
PREFILLED_SYRINGE | INTRAVENOUS | Status: AC
  Filled 2018-08-11: qty 5

## 2018-08-11 MED ORDER — VANCOMYCIN HCL IN DEXTROSE 1-5 GM/200ML-% IV SOLN
1000.0000 mg | INTRAVENOUS | Status: AC
  Administered 2018-08-11: 1000 mg via INTRAVENOUS
  Filled 2018-08-11: qty 200

## 2018-08-11 MED ORDER — INSULIN ASPART 100 UNIT/ML ~~LOC~~ SOLN: 0.0000 [IU] | SUBCUTANEOUS | Status: DC

## 2018-08-11 MED ORDER — BISACODYL 5 MG PO TBEC
10.0000 mg | DELAYED_RELEASE_TABLET | Freq: Every day | ORAL | Status: DC
  Administered 2018-08-11 – 2018-08-13 (×3): 10 mg via ORAL
  Filled 2018-08-11 (×4): qty 2

## 2018-08-11 MED ORDER — BUPIVACAINE HCL (PF) 0.5 % IJ SOLN
INTRAMUSCULAR | Status: DC | PRN
  Administered 2018-08-11: 30 mL

## 2018-08-11 MED ORDER — SODIUM CHLORIDE 0.9 % IV SOLN
INTRAVENOUS | Status: DC | PRN
  Administered 2018-08-11: 25 ug/min via INTRAVENOUS

## 2018-08-11 MED ORDER — BUDESONIDE 0.25 MG/2ML IN SUSP
0.2500 mg | Freq: Two times a day (BID) | RESPIRATORY_TRACT | Status: DC
  Administered 2018-08-12 – 2018-08-15 (×7): 0.25 mg via RESPIRATORY_TRACT
  Filled 2018-08-11 (×7): qty 2

## 2018-08-11 MED ORDER — EPHEDRINE 5 MG/ML INJ
INTRAVENOUS | Status: AC
  Filled 2018-08-11: qty 10

## 2018-08-11 MED ORDER — SUCCINYLCHOLINE CHLORIDE 200 MG/10ML IV SOSY
PREFILLED_SYRINGE | INTRAVENOUS | Status: AC
  Filled 2018-08-11: qty 10

## 2018-08-11 MED ORDER — FENTANYL CITRATE (PF) 250 MCG/5ML IJ SOLN
INTRAMUSCULAR | Status: AC
  Filled 2018-08-11: qty 5

## 2018-08-11 MED ORDER — MIDAZOLAM HCL 5 MG/5ML IJ SOLN
INTRAMUSCULAR | Status: DC | PRN
  Administered 2018-08-11: 2 mg via INTRAVENOUS

## 2018-08-11 MED ORDER — SODIUM CHLORIDE 0.9 % IJ SOLN
INTRAMUSCULAR | Status: DC | PRN
  Administered 2018-08-11: 50 mL

## 2018-08-11 MED ORDER — DIPHENHYDRAMINE HCL 50 MG/ML IJ SOLN: 12.5000 mg | Freq: Four times a day (QID) | INTRAMUSCULAR | Status: DC | PRN

## 2018-08-11 MED ORDER — SENNOSIDES-DOCUSATE SODIUM 8.6-50 MG PO TABS
1.0000 | ORAL_TABLET | Freq: Every day | ORAL | Status: DC
  Administered 2018-08-12 – 2018-08-14 (×3): 1 via ORAL
  Filled 2018-08-11 (×3): qty 1

## 2018-08-11 MED ORDER — KETOTIFEN FUMARATE 0.025 % OP SOLN
1.0000 [drp] | Freq: Two times a day (BID) | OPHTHALMIC | Status: DC
  Administered 2018-08-12 – 2018-08-15 (×6): 1 [drp] via OPHTHALMIC
  Filled 2018-08-11: qty 5

## 2018-08-11 SURGICAL SUPPLY — 89 items
CANISTER SUCT 3000ML PPV (MISCELLANEOUS) ×6 IMPLANT
CATH THORACIC 28FR (CATHETERS) ×3 IMPLANT
CATH THORACIC 36FR (CATHETERS) IMPLANT
CATH THORACIC 36FR RT ANG (CATHETERS) IMPLANT
CLIP VESOCCLUDE MED 6/CT (CLIP) ×3 IMPLANT
CONN ST 1/4X3/8  BEN (MISCELLANEOUS)
CONN ST 1/4X3/8 BEN (MISCELLANEOUS) IMPLANT
CONN Y 3/8X3/8X3/8  BEN (MISCELLANEOUS)
CONN Y 3/8X3/8X3/8 BEN (MISCELLANEOUS) IMPLANT
CONT SPEC 4OZ CLIKSEAL STRL BL (MISCELLANEOUS) ×9 IMPLANT
COVER SURGICAL LIGHT HANDLE (MISCELLANEOUS) ×3 IMPLANT
DERMABOND ADHESIVE PROPEN (GAUZE/BANDAGES/DRESSINGS) ×1
DERMABOND ADVANCED (GAUZE/BANDAGES/DRESSINGS) ×1
DERMABOND ADVANCED .7 DNX12 (GAUZE/BANDAGES/DRESSINGS) ×2 IMPLANT
DERMABOND ADVANCED .7 DNX6 (GAUZE/BANDAGES/DRESSINGS) ×2 IMPLANT
DRAIN CHANNEL 28F RND 3/8 FF (WOUND CARE) IMPLANT
DRAIN CHANNEL 32F RND 10.7 FF (WOUND CARE) IMPLANT
DRAPE HALF SHEET 40X57 (DRAPES) ×6 IMPLANT
DRAPE LAPAROSCOPIC ABDOMINAL (DRAPES) ×3 IMPLANT
DRAPE WARM FLUID 44X44 (DRAPE) ×3 IMPLANT
ELECT BLADE 6.5 EXT (BLADE) ×3 IMPLANT
ELECT REM PT RETURN 9FT ADLT (ELECTROSURGICAL) ×3
ELECTRODE REM PT RTRN 9FT ADLT (ELECTROSURGICAL) ×2 IMPLANT
GAUZE SPONGE 4X4 12PLY STRL (GAUZE/BANDAGES/DRESSINGS) IMPLANT
GAUZE SPONGE 4X4 12PLY STRL LF (GAUZE/BANDAGES/DRESSINGS) ×3 IMPLANT
GLOVE BIOGEL PI IND STRL 7.5 (GLOVE) ×6 IMPLANT
GLOVE BIOGEL PI INDICATOR 7.5 (GLOVE) ×3
GLOVE SURG SIGNA 7.5 PF LTX (GLOVE) ×6 IMPLANT
GOWN STRL REUS W/ TWL LRG LVL3 (GOWN DISPOSABLE) ×4 IMPLANT
GOWN STRL REUS W/ TWL XL LVL3 (GOWN DISPOSABLE) ×4 IMPLANT
GOWN STRL REUS W/TWL LRG LVL3 (GOWN DISPOSABLE) ×2
GOWN STRL REUS W/TWL XL LVL3 (GOWN DISPOSABLE) ×2
HEMOSTAT SURGICEL 2X14 (HEMOSTASIS) IMPLANT
KIT BASIN OR (CUSTOM PROCEDURE TRAY) ×3 IMPLANT
KIT SUCTION CATH 14FR (SUCTIONS) ×3 IMPLANT
KIT TURNOVER KIT B (KITS) ×3 IMPLANT
NEEDLE HYPO 25GX1X1/2 BEV (NEEDLE) ×3 IMPLANT
NEEDLE SPNL 18GX3.5 QUINCKE PK (NEEDLE) IMPLANT
NEEDLE SPNL 22GX3.5 QUINCKE BK (NEEDLE) ×3 IMPLANT
NS IRRIG 1000ML POUR BTL (IV SOLUTION) ×9 IMPLANT
PACK CHEST (CUSTOM PROCEDURE TRAY) ×3 IMPLANT
PAD ARMBOARD 7.5X6 YLW CONV (MISCELLANEOUS) ×6 IMPLANT
POUCH ENDO CATCH II 15MM (MISCELLANEOUS) IMPLANT
POUCH RETRIEVAL ECOSAC 10 (ENDOMECHANICALS) ×2 IMPLANT
POUCH RETRIEVAL ECOSAC 10MM (ENDOMECHANICALS) ×1
POUCH SPECIMEN RETRIEVAL 10MM (ENDOMECHANICALS) IMPLANT
RELOAD STAPLER GOLD 60MM (STAPLE) ×10 IMPLANT
SCISSORS ENDO CVD 5DCS (MISCELLANEOUS) IMPLANT
SEALANT PROGEL (MISCELLANEOUS) IMPLANT
SEALANT SURG COSEAL 4ML (VASCULAR PRODUCTS) IMPLANT
SEALANT SURG COSEAL 8ML (VASCULAR PRODUCTS) IMPLANT
SHEARS HARMONIC HDI 20CM (ELECTROSURGICAL) ×3 IMPLANT
SOLUTION ANTI FOG 6CC (MISCELLANEOUS) ×3 IMPLANT
SPECIMEN JAR MEDIUM (MISCELLANEOUS) ×3 IMPLANT
SPONGE INTESTINAL PEANUT (DISPOSABLE) ×3 IMPLANT
SPONGE TONSIL TAPE 1 RFD (DISPOSABLE) ×6 IMPLANT
STAPLE ECHEON FLEX 60 POW ENDO (STAPLE) ×3 IMPLANT
STAPLER RELOAD GOLD 60MM (STAPLE) ×15
SUT PROLENE 4 0 RB 1 (SUTURE)
SUT PROLENE 4-0 RB1 .5 CRCL 36 (SUTURE) IMPLANT
SUT SILK  1 MH (SUTURE) ×2
SUT SILK 1 MH (SUTURE) ×4 IMPLANT
SUT SILK 1 TIES 10X30 (SUTURE) ×3 IMPLANT
SUT SILK 2 0 SH (SUTURE) IMPLANT
SUT SILK 2 0SH CR/8 30 (SUTURE) IMPLANT
SUT SILK 3 0 SH 30 (SUTURE) IMPLANT
SUT SILK 3 0SH CR/8 30 (SUTURE) ×3 IMPLANT
SUT VIC AB 0 CTX 27 (SUTURE) IMPLANT
SUT VIC AB 1 CTX 27 (SUTURE) ×3 IMPLANT
SUT VIC AB 2-0 CT1 27 (SUTURE)
SUT VIC AB 2-0 CT1 TAPERPNT 27 (SUTURE) IMPLANT
SUT VIC AB 2-0 CTX 36 (SUTURE) ×6 IMPLANT
SUT VIC AB 3-0 MH 27 (SUTURE) IMPLANT
SUT VIC AB 3-0 SH 27 (SUTURE)
SUT VIC AB 3-0 SH 27X BRD (SUTURE) IMPLANT
SUT VIC AB 3-0 X1 27 (SUTURE) ×3 IMPLANT
SUT VICRYL 0 UR6 27IN ABS (SUTURE) IMPLANT
SUT VICRYL 2 TP 1 (SUTURE) IMPLANT
SYR 30ML LL (SYRINGE) ×3 IMPLANT
SYSTEM SAHARA CHEST DRAIN ATS (WOUND CARE) ×3 IMPLANT
TAPE CLOTH SURG 6X10 WHT LF (GAUZE/BANDAGES/DRESSINGS) ×3 IMPLANT
TIP APPLICATOR SPRAY EXTEND 16 (VASCULAR PRODUCTS) IMPLANT
TOWEL GREEN STERILE (TOWEL DISPOSABLE) ×3 IMPLANT
TOWEL GREEN STERILE FF (TOWEL DISPOSABLE) ×3 IMPLANT
TRAY FOLEY MTR SLVR 16FR STAT (SET/KITS/TRAYS/PACK) ×3 IMPLANT
TRAY FOLEY SLVR 16FR LF STAT (SET/KITS/TRAYS/PACK) ×3 IMPLANT
TROCAR XCEL BLADELESS 5X75MML (TROCAR) ×3 IMPLANT
TROCAR XCEL NON-BLD 5MMX100MML (ENDOMECHANICALS) IMPLANT
WATER STERILE IRR 1000ML POUR (IV SOLUTION) ×6 IMPLANT

## 2018-08-11 NOTE — Anesthesia Procedure Notes (Addendum)
Procedure Name: Intubation Date/Time: 08/11/2018 7:42 AM Performed by: Gaylene Brooks, CRNA Pre-anesthesia Checklist: Patient identified, Emergency Drugs available, Suction available and Patient being monitored Patient Re-evaluated:Patient Re-evaluated prior to induction Oxygen Delivery Method: Circle system utilized Preoxygenation: Pre-oxygenation with 100% oxygen Induction Type: IV induction Ventilation: Mask ventilation without difficulty Laryngoscope Size: Mac and 3 Grade View: Grade I Tube type: Oral Endobronchial tube: Double lumen EBT and Left and 39 Fr Number of attempts: 1 Airway Equipment and Method: Stylet Placement Confirmation: ETT inserted through vocal cords under direct vision,  positive ETCO2,  CO2 detector and breath sounds checked- equal and bilateral Secured at: 29 cm Tube secured with: Tape Dental Injury: Teeth and Oropharynx as per pre-operative assessment  Comments: Performed by Ivor Messier

## 2018-08-11 NOTE — Transfer of Care (Signed)
Immediate Anesthesia Transfer of Care Note  Patient: Kara Ramirez  Procedure(s) Performed: VIDEO ASSISTED THORACOSCOPY (VATS)/WEDGE RESECTION of LEFT LOWER LOBE LUNG (Left Chest) CHEST TUBE INSERTION (Left Chest)  Patient Location: PACU  Anesthesia Type:General  Level of Consciousness: awake, alert  and oriented  Airway & Oxygen Therapy: Patient Spontanous Breathing and Patient connected to face mask oxygen  Post-op Assessment: Report given to RN, Post -op Vital signs reviewed and stable and Patient moving all extremities X 4  Post vital signs: Reviewed and stable  Last Vitals:  Vitals Value Taken Time  BP    Temp    Pulse 65 08/11/2018 10:00 AM  Resp 16 08/11/2018 10:00 AM  SpO2 98 % 08/11/2018 10:00 AM  Vitals shown include unvalidated device data.  Last Pain:  Vitals:   08/11/18 0625  TempSrc:   PainSc: 0-No pain         Complications: No apparent anesthesia complications
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
## 2018-08-11 NOTE — Anesthesia Procedure Notes (Signed)
Arterial Line Insertion Start/End9/23/2019 7:10 AM, 08/11/2018 7:20 AM Performed by: Gaylene Brooks, CRNA, CRNA  Patient location: Pre-op. Preanesthetic checklist: patient identified, IV checked, risks and benefits discussed, surgical consent, monitors and equipment checked and pre-op evaluation Lidocaine 1% used for infiltration and patient sedated Right, radial was placed Catheter size: 20 G Hand hygiene performed  and maximum sterile barriers used   Attempts: 2 Procedure performed without using ultrasound guided technique. Following insertion, dressing applied and Biopatch. Post procedure assessment: normal  Patient tolerated the procedure well with no immediate complications. Additional procedure comments: Performed by Rosemarie Beath. First attempt x1 flash of blood could not advance wire. Second attempt good flash and successful advancement. [name]

## 2018-08-11 NOTE — Brief Op Note (Addendum)
08/11/2018  3:56 PM  PATIENT:  [name]  67 y.o. female  PRE-OPERATIVE DIAGNOSIS:  LLL NODULE  POST-OPERATIVE DIAGNOSIS:  Necrotizing Granuloma, left lower lobe  PROCEDURE: LEFT VATS WEDGE RESECTION LEFT LOWER LOBE NODULE INTERCOSTAL NERVE BLOCK  SURGEON:  Surgeon(s) and Role:    * Melrose Nakayama, MD - Primary  PHYSICIAN ASSISTANT:  Nicholes Rough, PA-C   ANESTHESIA:   general  EBL:  50 mL   BLOOD ADMINISTERED:none  DRAINS: ONE STRAIGHT CHEST TUBE   LOCAL MEDICATIONS USED:  BUPIVICAINE   SPECIMEN:  Source of Specimen:  LEFT LOWER LOBE WEDGE RESECTION  DISPOSITION OF SPECIMEN:  PATHOLOGY  COUNTS:  YES  DICTATION: .Dragon Dictation  PLAN OF CARE: Admit to inpatient   PATIENT DISPOSITION:  PACU - hemodynamically stable.   Delay start of Pharmacological VTE agent (>24hrs) due to surgical blood loss or risk of bleeding: no

## 2018-08-11 NOTE — Anesthesia Procedure Notes (Signed)
Central Venous Catheter Insertion Performed by: Roderic Palau, MD, anesthesiologist Start/End9/23/2019 5:50 AM, 08/11/2018 7:00 AM Patient location: Pre-op. Preanesthetic checklist: patient identified, IV checked, site marked, risks and benefits discussed, surgical consent, monitors and equipment checked, pre-op evaluation, timeout performed and anesthesia consent Position: Trendelenburg Lidocaine 1% used for infiltration and patient sedated Hand hygiene performed , maximum sterile barriers used  and Seldinger technique used Catheter size: 8 Fr Total catheter length 16. Central line was placed.Double lumen Procedure performed without using ultrasound guided technique. Attempts: 1 Following insertion, dressing applied, line sutured and Biopatch. Post procedure assessment: blood return through all ports  Patient tolerated the procedure well with no immediate complications.

## 2018-08-11 NOTE — Anesthesia Postprocedure Evaluation (Signed)
Anesthesia Post Note  Patient: Kara Ramirez  Procedure(s) Performed: VIDEO ASSISTED THORACOSCOPY (VATS)/WEDGE RESECTION of LEFT LOWER LOBE LUNG (Left Chest) CHEST TUBE INSERTION (Left Chest)     Patient location during evaluation: PACU Anesthesia Type: General Level of consciousness: awake and alert Pain management: pain level controlled Vital Signs Assessment: post-procedure vital signs reviewed and stable Respiratory status: spontaneous breathing, nonlabored ventilation and respiratory function stable Cardiovascular status: blood pressure returned to baseline and stable Postop Assessment: no apparent nausea or vomiting Anesthetic complications: no    Last Vitals:  Vitals:   08/11/18 1045 08/11/18 1100  BP: 114/69 114/77  Pulse: (!) 58 (!) 58  Resp: 13 15  Temp:    SpO2: 99% 99%    Last Pain:  Vitals:   08/11/18 1054  TempSrc:   PainSc: 7                  Kimi Bordeau,W. EDMOND

## 2018-08-11 NOTE — Interval H&P Note (Signed)
History and Physical Interval Note:  08/11/2018 7:25 AM  Kara Ramirez  has presented today for surgery, with the diagnosis of LLL NODULE  The various methods of treatment have been discussed with the patient and family. After consideration of risks, benefits and other options for treatment, the patient has consented to  Procedure(s): VIDEO ASSISTED THORACOSCOPY (VATS)/WEDGE RESECTION (Left) possible lower LOBECTOMY (Left) as a surgical intervention .  The patient's history has been reviewed, patient examined, no change in status, stable for surgery.  I have reviewed the patient's chart and labs.  Questions were answered to the patient's satisfaction.     Melrose Nakayama

## 2018-08-12 ENCOUNTER — Encounter (HOSPITAL_COMMUNITY): Payer: Self-pay | Admitting: Thoracic Surgery (Cardiothoracic Vascular Surgery)

## 2018-08-12 ENCOUNTER — Inpatient Hospital Stay (HOSPITAL_COMMUNITY): Payer: PPO

## 2018-08-12 LAB — BASIC METABOLIC PANEL
Anion gap: 10 (ref 5–15)
BUN: 10 mg/dL (ref 8–23)
CO2: 22 mmol/L (ref 22–32)
Calcium: 9.2 mg/dL (ref 8.9–10.3)
Chloride: 107 mmol/L (ref 98–111)
Creatinine, Ser: 0.66 mg/dL (ref 0.44–1.00)
GFR calc Af Amer: >60 mL/min (ref >60)
GFR calc non Af Amer: >60 mL/min (ref >60)
Glucose, Bld: 104 mg/dL — ABNORMAL HIGH (ref 70–99)
Potassium: 3.4 mmol/L — ABNORMAL LOW (ref 3.5–5.1)
Sodium: 139 mmol/L (ref 135–145)

## 2018-08-12 LAB — GLUCOSE, CAPILLARY
Glucose-Capillary: 109 mg/dL — ABNORMAL HIGH (ref 70–99)
Glucose-Capillary: 122 mg/dL — ABNORMAL HIGH (ref 70–99)
Glucose-Capillary: 86 mg/dL (ref 70–99)
Glucose-Capillary: 96 mg/dL (ref 70–99)
Glucose-Capillary: 97 mg/dL (ref 70–99)

## 2018-08-12 LAB — ACID FAST SMEAR (AFB, MYCOBACTERIA): Acid Fast Smear: NEGATIVE

## 2018-08-12 LAB — BLOOD GAS, ARTERIAL
Acid-base deficit: 0.3 mmol/L (ref 0.0–2.0)
Bicarbonate: 23.1 mmol/L (ref 20.0–28.0)
O2 Content: 2 L/min
O2 Saturation: 96.8 %
Patient temperature: 98.6
pCO2 arterial: 33.3 mmHg (ref 32.0–48.0)
pH, Arterial: 7.456 — ABNORMAL HIGH (ref 7.350–7.450)
pO2, Arterial: 88.9 mmHg (ref 83.0–108.0)

## 2018-08-12 LAB — CBC
HCT: 34.8 % — ABNORMAL LOW (ref 36.0–46.0)
Hemoglobin: 11.4 g/dL — ABNORMAL LOW (ref 12.0–15.0)
MCH: 29 pg (ref 26.0–34.0)
MCHC: 32.8 g/dL (ref 30.0–36.0)
MCV: 88.5 fL (ref 78.0–100.0)
Platelets: 193 10*3/uL (ref 150–400)
RBC: 3.93 MIL/uL (ref 3.87–5.11)
RDW: 13 % (ref 11.5–15.5)
WBC: 10.7 10*3/uL — ABNORMAL HIGH (ref 4.0–10.5)

## 2018-08-12 MED ORDER — POTASSIUM CHLORIDE 10 MEQ/50ML IV SOLN
10.0000 meq | INTRAVENOUS | Status: AC
  Administered 2018-08-12 (×6): 10 meq via INTRAVENOUS
  Filled 2018-08-12 (×2): qty 50

## 2018-08-12 MED ORDER — ACETAMINOPHEN 325 MG PO TABS: 650.0000 mg | ORAL_TABLET | Freq: Four times a day (QID) | ORAL | Status: DC | PRN

## 2018-08-12 NOTE — Progress Notes (Addendum)
      [address]       [place] [address]             [phone]       1 Day Post-Op Procedure(s) (LRB): VIDEO ASSISTED THORACOSCOPY (VATS)/WEDGE RESECTION of LEFT LOWER LOBE LUNG (Left) CHEST TUBE INSERTION (Left)  Subjective: Patient had a headache, nausea, and vomiting yesterday. She states she is very sensitive to pain medication.  Objective: Vital signs in last 24 hours: Temp:  [97 F (36.1 C)-98.6 F (37 C)] 98.6 F (37 C) (09/24 0744) Pulse Rate:  [56-88] 70 (09/24 0744) Cardiac Rhythm: Normal sinus rhythm (09/24 0400) Resp:  [9-27] 20 (09/24 0744) BP: (101-121)/(52-82) 101/52 (09/24 0744) SpO2:  [95 %-100 %] 98 % (09/24 0744) Arterial Line BP: (125-154)/(65-97) 130/68 (09/24 0400) Weight:  [71.4 kg] 71.4 kg (09/24 0018)      Intake/Output from previous day: 09/23 0701 - 09/24 0700 In: 2762.1 [P.O.:240; I.V.:2262.1; IV Piggyback:250] Out: 2625 [Urine:2525; Blood:50; Chest Tube:50]   Physical Exam:  Cardiovascular: RRR Pulmonary: Clear to auscultation bilaterally; Abdomen: Soft, non tender, bowel sounds present. Extremities: Trace bilateral lower extremity edema. Wounds: Dressing is clean and dry.   Chest Tube:to suction, no air leak  Lab Results: CBC: Recent Labs    08/12/18 0425  WBC 10.7*  HGB 11.4*  HCT 34.8*  PLT 193   BMET:  Recent Labs    08/12/18 0425  NA 139  K 3.4*  CL 107  CO2 22  GLUCOSE 104*  BUN 10  CREATININE 0.66  CALCIUM 9.2    PT/INR: No results for input(s): LABPROT, INR in the last 72 hours. ABG:  INR: Will add last result for INR, ABG once components are confirmed Will add last 4 CBG results once components are confirmed  Assessment/Plan:  1. CV - SR 2.  Pulmonary - Chest tube with 50 cc output since surgery. Chest tube is to suction and there is no air leak. CXR this am appears stable (no pneumothorax). Likely place chest tube to water seal. Check CXR in am. Encourage incentive  spirometer. 3. GI-Nausea and vomiting yesterday. Phenergan did help. On Fentanyl PCA. Unable to take codeine or Tramadol. ? Try Toradol 4. Supplement potassium 5. Decrease IVF, remove foley and a line  Donielle M ZimmermanPA-C 08/12/2018,7:55 AM [phone]  Patient seen and examined, agree with above Requesting a lower dose of Tylenol No air leak- CT to water seal [employer] C. Roxan Hockey, MD [facility] [phone]
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
## 2018-08-12 NOTE — Discharge Instructions (Signed)
Thoracoscopy, Care After °Refer to this sheet in the next few weeks. These instructions provide you with information about caring for yourself after your procedure. Your health care provider may also give you more specific instructions. Your treatment has been planned according to current medical practices, but problems sometimes occur. Call your health care provider if you have any problems or questions after your procedure. °What can I expect after the procedure? °After your procedure, it is common to feel sore for up to two weeks. °Follow these instructions at home: °· There are many different ways to close and cover an incision, including stitches (sutures), skin glue, and adhesive strips. Follow your health care provider's instructions about: °? Incision care. °? Bandage (dressing) changes and removal. °? Incision closure removal. °· Check your incision area every day for signs of infection. Watch for: °? Redness, swelling, or pain. °? Fluid, blood, or pus. °· Take medicines only as directed by your health care provider. °· Try to cough often. Coughing helps to protect against lung infection (pneumonia). It may hurt to cough. If this happens, hold a pillow against your chest when you cough. °· Take deep breaths. This also helps to protect against pneumonia. °· If you were given an incentive spirometer, use it as directed by your health care provider. °· Do not take baths, swim, or use a hot tub until your health care provider approves. You may take showers. °· Avoid lifting until your health care provider approves. °· Avoid driving until your health care provider approves. °· Do not travel by airplane after the chest tube is removed until your health care provider approves. °Contact a health care provider if: °· You have a fever. °· Pain medicines do not ease your pain. °· You have redness, swelling, or increasing pain in your incision area. °· You develop a cough that does not go away, or you are coughing up  mucus that is yellow or green. °Get help right away if: °· You have fluid, blood, or pus coming from your incision. °· There is a bad smell coming from your incision or dressing. °· You develop a rash. °· You have difficulty breathing. °· You cough up blood. °· You develop light-headedness or you feel faint. °· You develop chest pain. °· Your heartbeat feels irregular or very fast. °This information is not intended to replace advice given to you by your health care provider. Make sure you discuss any questions you have with your health care provider. °Document Released: 05/25/2005 Document Revised: 07/08/2016 Document Reviewed: 07/21/2014 °Elsevier Interactive Patient Education © 2018 [employer] Inc[employer] ° °

## 2018-08-13 ENCOUNTER — Inpatient Hospital Stay (HOSPITAL_COMMUNITY): Payer: PPO

## 2018-08-13 LAB — GLUCOSE, CAPILLARY
Glucose-Capillary: 102 mg/dL — ABNORMAL HIGH (ref 70–99)
Glucose-Capillary: 134 mg/dL — ABNORMAL HIGH (ref 70–99)
Glucose-Capillary: 90 mg/dL (ref 70–99)
Glucose-Capillary: 97 mg/dL (ref 70–99)
Glucose-Capillary: 97 mg/dL (ref 70–99)
Glucose-Capillary: 98 mg/dL (ref 70–99)

## 2018-08-13 LAB — COMPREHENSIVE METABOLIC PANEL
ALT: 17 U/L (ref 0–44)
AST: 18 U/L (ref 15–41)
Albumin: 3.5 g/dL (ref 3.5–5.0)
Alkaline Phosphatase: 45 U/L (ref 38–126)
Anion gap: 10 (ref 5–15)
BUN: 7 mg/dL — ABNORMAL LOW (ref 8–23)
CO2: 23 mmol/L (ref 22–32)
Calcium: 8.9 mg/dL (ref 8.9–10.3)
Chloride: 105 mmol/L (ref 98–111)
Creatinine, Ser: 0.68 mg/dL (ref 0.44–1.00)
GFR calc Af Amer: >60 mL/min (ref >60)
GFR calc non Af Amer: >60 mL/min (ref >60)
Glucose, Bld: 107 mg/dL — ABNORMAL HIGH (ref 70–99)
Potassium: 3.4 mmol/L — ABNORMAL LOW (ref 3.5–5.1)
Sodium: 138 mmol/L (ref 135–145)
Total Bilirubin: 1.1 mg/dL (ref 0.3–1.2)
Total Protein: 6.6 g/dL (ref 6.5–8.1)

## 2018-08-13 LAB — CBC
HCT: 36 % (ref 36.0–46.0)
Hemoglobin: 11.7 g/dL — ABNORMAL LOW (ref 12.0–15.0)
MCH: 29.3 pg (ref 26.0–34.0)
MCHC: 32.5 g/dL (ref 30.0–36.0)
MCV: 90 fL (ref 78.0–100.0)
Platelets: 178 10*3/uL (ref 150–400)
RBC: 4 MIL/uL (ref 3.87–5.11)
RDW: 13.1 % (ref 11.5–15.5)
WBC: 9.6 10*3/uL (ref 4.0–10.5)

## 2018-08-13 MED ORDER — FENTANYL 40 MCG/ML IV SOLN
INTRAVENOUS | Status: AC
  Filled 2018-08-13: qty 25

## 2018-08-13 NOTE — Progress Notes (Addendum)
      [address]       [place] [address]             [phone]       2 Days Post-Op Procedure(s) (LRB): VIDEO ASSISTED THORACOSCOPY (VATS)/WEDGE RESECTION of LEFT LOWER LOBE LUNG (Left) CHEST TUBE INSERTION (Left)  Subjective: Patient has had no further nausea or emesis.  Objective: Vital signs in last 24 hours: Temp:  [98.8 F (37.1 C)-99 F (37.2 C)] 99 F (37.2 C) (09/25 0352) Pulse Rate:  [71-87] 79 (09/25 0427) Cardiac Rhythm: Normal sinus rhythm (09/25 0427) Resp:  [17-32] 27 (09/25 0427) BP: (89-122)/(35-86) 122/71 (09/25 0427) SpO2:  [96 %-100 %] 99 % (09/25 0716)      Intake/Output from previous day: 09/24 0701 - 09/25 0700 In: 1416.3 [P.O.:240; I.V.:739.5; IV Piggyback:436.8] Out: 1942 [Urine:1750; Chest Tube:192]   Physical Exam:  Cardiovascular: RRR Pulmonary: Clear to auscultation bilaterally; Abdomen: Soft, non tender, bowel sounds present. Extremities: Trace bilateral lower extremity edema. Wounds: Dressing is clean and dry.   Chest Tube:to water seal, tidling with cough but no air leak  Lab Results: CBC: Recent Labs    08/12/18 0425 08/13/18 0434  WBC 10.7* 9.6  HGB 11.4* 11.7*  HCT 34.8* 36.0  PLT 193 178   BMET:  Recent Labs    08/12/18 0425 08/13/18 0434  NA 139 138  K 3.4* 3.4*  CL 107 105  CO2 22 23  GLUCOSE 104* 107*  BUN 10 7*  CREATININE 0.66 0.68  CALCIUM 9.2 8.9    PT/INR: No results for input(s): LABPROT, INR in the last 72 hours. ABG:  INR: Will add last result for INR, ABG once components are confirmed Will add last 4 CBG results once components are confirmed  Assessment/Plan:  1. CV - SR 2.  Pulmonary - Chest tube with 192 cc output since surgery. Chest tube is to water seal and there is no air leak. CXR this am appears stable. Will remove chest tube and check CXR in am. Encourage incentive spirometer. Final pathology pending.    Donielle M ZimmermanPA-C 08/13/2018,7:45  AM [phone] Patient seen and examined, agree with above Dc chest tube Path- necrotizing granuloma AFB stains negative, cultures pending  Remo Lipps C. Roxan Hockey, MD [facility] [phone]'

## 2018-08-13 NOTE — Plan of Care (Signed)
Progressing well. Chest tube removal tolerated well and ambulating around room.

## 2018-08-13 NOTE — Progress Notes (Signed)
Wasted 20 ml of Fentanyl PCA in sink with Gwenyth Bouillon.

## 2018-08-14 ENCOUNTER — Inpatient Hospital Stay (HOSPITAL_COMMUNITY): Payer: PPO

## 2018-08-14 DIAGNOSIS — Z8679 Personal history of other diseases of the circulatory system: Secondary | ICD-10-CM

## 2018-08-14 DIAGNOSIS — I9789 Other postprocedural complications and disorders of the circulatory system, not elsewhere classified: Secondary | ICD-10-CM

## 2018-08-14 DIAGNOSIS — I4891 Unspecified atrial fibrillation: Secondary | ICD-10-CM

## 2018-08-14 DIAGNOSIS — R911 Solitary pulmonary nodule: Secondary | ICD-10-CM

## 2018-08-14 LAB — BASIC METABOLIC PANEL
Anion gap: 8 (ref 5–15)
BUN: 7 mg/dL — ABNORMAL LOW (ref 8–23)
CO2: 24 mmol/L (ref 22–32)
Calcium: 8.9 mg/dL (ref 8.9–10.3)
Chloride: 106 mmol/L (ref 98–111)
Creatinine, Ser: 0.64 mg/dL (ref 0.44–1.00)
GFR calc Af Amer: >60 mL/min (ref >60)
GFR calc non Af Amer: >60 mL/min (ref >60)
Glucose, Bld: 109 mg/dL — ABNORMAL HIGH (ref 70–99)
Potassium: 3.2 mmol/L — ABNORMAL LOW (ref 3.5–5.1)
Sodium: 138 mmol/L (ref 135–145)

## 2018-08-14 LAB — CBC WITH DIFFERENTIAL/PLATELET
Abs Immature Granulocytes: 0 10*3/uL (ref 0.0–0.1)
Basophils Absolute: 0.1 10*3/uL (ref 0.0–0.1)
Basophils Relative: 1 %
Eosinophils Absolute: 0.2 10*3/uL (ref 0.0–0.7)
Eosinophils Relative: 2 %
HCT: 36.7 % (ref 36.0–46.0)
Hemoglobin: 11.8 g/dL — ABNORMAL LOW (ref 12.0–15.0)
Immature Granulocytes: 0 %
Lymphocytes Relative: 23 %
Lymphs Abs: 2.4 10*3/uL (ref 0.7–4.0)
MCH: 29 pg (ref 26.0–34.0)
MCHC: 32.2 g/dL (ref 30.0–36.0)
MCV: 90.2 fL (ref 78.0–100.0)
Monocytes Absolute: 1 10*3/uL (ref 0.1–1.0)
Monocytes Relative: 10 %
Neutro Abs: 6.9 10*3/uL (ref 1.7–7.7)
Neutrophils Relative %: 64 %
Platelets: 186 10*3/uL (ref 150–400)
RBC: 4.07 MIL/uL (ref 3.87–5.11)
RDW: 13.2 % (ref 11.5–15.5)
WBC: 10.6 10*3/uL — ABNORMAL HIGH (ref 4.0–10.5)

## 2018-08-14 LAB — MAGNESIUM: Magnesium: 1.9 mg/dL (ref 1.7–2.4)

## 2018-08-14 LAB — GLUCOSE, CAPILLARY: Glucose-Capillary: 120 mg/dL — ABNORMAL HIGH (ref 70–99)

## 2018-08-14 LAB — TSH: TSH: 0.189 u[IU]/mL — ABNORMAL LOW (ref 0.350–4.500)

## 2018-08-14 MED ORDER — POTASSIUM CHLORIDE CRYS ER 20 MEQ PO TBCR
40.0000 meq | EXTENDED_RELEASE_TABLET | Freq: Two times a day (BID) | ORAL | Status: AC
  Administered 2018-08-14 (×2): 40 meq via ORAL
  Filled 2018-08-14 (×2): qty 2

## 2018-08-14 MED ORDER — AMIODARONE LOAD VIA INFUSION
150.0000 mg | Freq: Once | INTRAVENOUS | Status: AC
  Administered 2018-08-14: 150 mg via INTRAVENOUS
  Filled 2018-08-14: qty 83.34

## 2018-08-14 MED ORDER — AMIODARONE HCL IN DEXTROSE 360-4.14 MG/200ML-% IV SOLN
30.0000 mg/h | INTRAVENOUS | Status: DC
  Administered 2018-08-14 – 2018-08-15 (×3): 30 mg/h via INTRAVENOUS
  Filled 2018-08-14 (×2): qty 200

## 2018-08-14 MED ORDER — AMIODARONE HCL IN DEXTROSE 360-4.14 MG/200ML-% IV SOLN
60.0000 mg/h | INTRAVENOUS | Status: DC
  Administered 2018-08-14: 60 mg/h via INTRAVENOUS
  Filled 2018-08-14 (×2): qty 200

## 2018-08-14 NOTE — Progress Notes (Addendum)
      [address]       [place] [address]             [phone]       3 Days Post-Op Procedure(s) (LRB): VIDEO ASSISTED THORACOSCOPY (VATS)/WEDGE RESECTION of LEFT LOWER LOBE LUNG (Left) CHEST TUBE INSERTION (Left)  Subjective: Patient without complaints this am.  Objective: Vital signs in last 24 hours: Temp:  [98 F (36.7 C)-99.2 F (37.3 C)] 99.2 F (37.3 C) (09/25 2316) Pulse Rate:  [90-95] 95 (09/25 2316) Cardiac Rhythm: Normal sinus rhythm (09/26 0526) Resp:  [21-26] 21 (09/25 2316) BP: (102-108)/(52-88) 103/52 (09/25 2316) SpO2:  [95 %-99 %] 95 % (09/26 0720)      Intake/Output from previous day: 09/25 0701 - 09/26 0700 In: 269.8 [P.O.:240; I.V.:29.8] Out: -    Physical Exam:  Cardiovascular: RRR Pulmonary: Clear to auscultation bilaterally; Abdomen: Soft, non tender, bowel sounds present. Extremities: Trace bilateral lower extremity edema. Wounds: Dressing is clean and dry.     Lab Results: CBC: Recent Labs    08/13/18 0434 08/14/18 0320  WBC 9.6 10.6*  HGB 11.7* 11.8*  HCT 36.0 36.7  PLT 178 186   BMET:  Recent Labs    08/13/18 0434 08/14/18 0320  NA 138 138  K 3.4* 3.2*  CL 105 106  CO2 23 24  GLUCOSE 107* 109*  BUN 7* 7*  CREATININE 0.68 0.64  CALCIUM 8.9 8.9    PT/INR: No results for input(s): LABPROT, INR in the last 72 hours. ABG:  INR: Will add last result for INR, ABG once components are confirmed Will add last 4 CBG results once components are confirmed  Assessment/Plan:  1. CV - She went into a fib with RVR earlier this am. Put on Amiodarone drip and converted to sinus rhythm. Is in SR this am with HR in the 90's. She was on Toprol XL 25 mg daily prior to admission. SBP in the 90's so will not restart yet. Patient has seen Dr. Gwenlyn Found in the past (had 30 day monitor which did not show a fib apparently). Will discuss with Dr. Roxan Hockey 2.  Pulmonary - CXR this am appears stable (small left apical  pneumothorax). Encourage incentive spirometer.  3. Supplement potassium   Donielle M ZimmermanPA-C 08/14/2018,7:40 AM [phone] Patient seen and examined, agree with above She has been evaluated for atrial fib in the past but never confirmed. Will ask Dr. Gwenlyn Found to see.  Revonda Standard Roxan Hockey, MD [facility] [phone]

## 2018-08-14 NOTE — Progress Notes (Signed)
Pt converted back to NSR at 0526. Will continue to monitor.  Tressie Ellis, RN

## 2018-08-14 NOTE — Progress Notes (Signed)
Pt went into A. Fib RVR. Pt asymptomatic. BP 117/79, Highest HR 211. EKG completed, MD made aware, Amiodarone drip ordered. HR now A. Fib 140's-150's. Will continue to monitor.  Tressie Ellis, RN
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
## 2018-08-15 DIAGNOSIS — I4891 Unspecified atrial fibrillation: Secondary | ICD-10-CM | POA: Clinically undetermined

## 2018-08-15 MED ORDER — AMIODARONE HCL 200 MG PO TABS
400.0000 mg | ORAL_TABLET | Freq: Two times a day (BID) | ORAL | Status: DC
  Administered 2018-08-15: 400 mg via ORAL
  Filled 2018-08-15: qty 2

## 2018-08-15 MED ORDER — AMIODARONE HCL 200 MG PO TABS: 400.0000 mg | ORAL_TABLET | Freq: Two times a day (BID) | ORAL | 1 refills | Status: DC

## 2018-08-15 MED ORDER — FLUTICASONE PROPIONATE 50 MCG/ACT NA SUSP: 1.0000 | Freq: Every day | NASAL | Status: DC

## 2018-08-15 MED ORDER — AMBULATORY NON FORMULARY MEDICATION: 1.0000 "application " | Freq: Three times a day (TID) | Status: DC | PRN

## 2018-08-15 NOTE — Progress Notes (Signed)
Verbal and written discharge instructions given to patient. All questions answered. Written prescription given to patient. PIC and CVL removed per discharge protocol. All personal belongings returned to patient. Patient transported by CNA via wheelchair to husband's waiting car for discharge.

## 2018-08-15 NOTE — Progress Notes (Addendum)
      [address]       [place] [address]             [phone]       4 Days Post-Op Procedure(s) (LRB): VIDEO ASSISTED THORACOSCOPY (VATS)/WEDGE RESECTION of LEFT LOWER LOBE LUNG (Left) CHEST TUBE INSERTION (Left)  Subjective: Patient without complaints this am. She wants to go home.  Objective: Vital signs in last 24 hours: Temp:  [98.3 F (36.8 C)-99.5 F (37.5 C)] 99.5 F (37.5 C) (09/27 0408) Pulse Rate:  [75-101] 92 (09/27 0408) Cardiac Rhythm: Normal sinus rhythm (09/27 0408) Resp:  [17-26] 19 (09/27 0408) BP: (98-105)/(52-71) 103/71 (09/27 0408) SpO2:  [95 %-100 %] 95 % (09/27 0408)      Intake/Output from previous day: 09/26 0701 - 09/27 0700 In: 430.1 [P.O.:120; I.V.:310.1] Out: 450 [Urine:450]   Physical Exam:  Cardiovascular: RRR Pulmonary: Clear to auscultation bilaterally; Abdomen: Soft, non tender, bowel sounds present. Extremities: Trace bilateral lower extremity edema. Wound:  Clean and dry and some bruising    Lab Results: CBC: Recent Labs    08/13/18 0434 08/14/18 0320  WBC 9.6 10.6*  HGB 11.7* 11.8*  HCT 36.0 36.7  PLT 178 186   BMET:  Recent Labs    08/13/18 0434 08/14/18 0320  NA 138 138  K 3.4* 3.2*  CL 105 106  CO2 23 24  GLUCOSE 107* 109*  BUN 7* 7*  CREATININE 0.68 0.64  CALCIUM 8.9 8.9    PT/INR: No results for input(s): LABPROT, INR in the last 72 hours. ABG:  INR: Will add last result for INR, ABG once components are confirmed Will add last 4 CBG results once components are confirmed  Assessment/Plan:  1. CV - She went into a fib with RVR yesterday am. Put on Amiodarone drip and converted to sinus rhythm. Remains in SR this am. Will stop Amiodarone drip ans start oral. Appreciate cardiology's assistance  2.  Pulmonary - CXR this am appears stable (small left apical pneumothorax). Encourage incentive spirometer.  3. TSH was low 0.189.  4. Remove central line 5. Will discuss disposition  with Dr. Edman Circle M ZimmermanPA-C 08/15/2018,7:52 AM [phone]  Patient seen and examined, agree with above Discussed with Dr. Ellyn Hack- appreciate his assistance will convert to PO amiodarone Home later today  Remo Lipps C. Roxan Hockey, MD [facility] [phone]

## 2018-08-15 NOTE — Plan of Care (Signed)
  Problem: Education: Goal: Knowledge of General Education information will improve Description: Including pain rating scale, medication(s)/side effects and non-pharmacologic comfort measures Outcome: Adequate for Discharge   Problem: Health Behavior/Discharge Planning: Goal: Ability to manage health-related needs will improve Outcome: Adequate for Discharge   Problem: Clinical Measurements: Goal: Ability to maintain clinical measurements within normal limits will improve Outcome: Adequate for Discharge Goal: Will remain free from infection Outcome: Adequate for Discharge Goal: Diagnostic test results will improve Outcome: Adequate for Discharge Goal: Respiratory complications will improve Outcome: Adequate for Discharge Goal: Cardiovascular complication will be avoided Outcome: Adequate for Discharge   Problem: Activity: Goal: Risk for activity intolerance will decrease Outcome: Adequate for Discharge   Problem: Nutrition: Goal: Adequate nutrition will be maintained Outcome: Adequate for Discharge   Problem: Coping: Goal: Level of anxiety will decrease Outcome: Adequate for Discharge   Problem: Elimination: Goal: Will not experience complications related to bowel motility Outcome: Adequate for Discharge Goal: Will not experience complications related to urinary retention Outcome: Adequate for Discharge   Problem: Pain Managment: Goal: General experience of comfort will improve Outcome: Adequate for Discharge   Problem: Safety: Goal: Ability to remain free from injury will improve Outcome: Adequate for Discharge   Problem: Skin Integrity: Goal: Risk for impaired skin integrity will decrease Outcome: Adequate for Discharge   Problem: Education: Goal: Knowledge of disease or condition will improve Outcome: Adequate for Discharge Goal: Knowledge of the prescribed therapeutic regimen will improve Outcome: Adequate for Discharge   Problem: Activity: Goal: Risk for  activity intolerance will decrease Outcome: Adequate for Discharge   Problem: Cardiac: Goal: Will achieve and/or maintain hemodynamic stability Outcome: Adequate for Discharge   Problem: Clinical Measurements: Goal: Postoperative complications will be avoided or minimized Outcome: Adequate for Discharge   Problem: Respiratory: Goal: Respiratory status will improve Outcome: Adequate for Discharge   Problem: Pain Management: Goal: Pain level will decrease Outcome: Adequate for Discharge   Problem: Skin Integrity: Goal: Wound healing without signs and symptoms infection will improve Outcome: Adequate for Discharge   
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
## 2018-08-16 LAB — AEROBIC/ANAEROBIC CULTURE W GRAM STAIN (SURGICAL/DEEP WOUND)

## 2018-08-16 LAB — AEROBIC/ANAEROBIC CULTURE (SURGICAL/DEEP WOUND): Culture: NO GROWTH

## 2018-08-20 ENCOUNTER — Other Ambulatory Visit: Payer: Self-pay | Admitting: Neurosurgery

## 2018-08-20 DIAGNOSIS — S069X0A Unspecified intracranial injury without loss of consciousness, initial encounter: Secondary | ICD-10-CM

## 2018-08-22 ENCOUNTER — Ambulatory Visit (INDEPENDENT_AMBULATORY_CARE_PROVIDER_SITE_OTHER): Payer: Self-pay

## 2018-08-22 ENCOUNTER — Telehealth: Payer: Self-pay

## 2018-08-22 DIAGNOSIS — Z4802 Encounter for removal of sutures: Secondary | ICD-10-CM

## 2018-08-22 NOTE — Telephone Encounter (Signed)
Patient returned call, we scheduled OV with Dr. Valeta Harms for 10/16 at 2:30 pm.  No call back is necessary.

## 2018-08-22 NOTE — Telephone Encounter (Signed)
Patient needs to be placed on Dr. Juline Patch schedule October 15th or within that week.   Called and spoke with patient, patient in the store at this time and will call back when she is free.

## 2018-08-22 NOTE — Progress Notes (Signed)
Removed 1 suture from chest tube site, no signs of infection and patient tolerated well. 

## 2018-08-25 DIAGNOSIS — N819 Female genital prolapse, unspecified: Secondary | ICD-10-CM | POA: Diagnosis not present

## 2018-08-29 ENCOUNTER — Ambulatory Visit
Admission: RE | Admit: 2018-08-29 | Discharge: 2018-08-29 | Disposition: A | Discharge status: Home / Self Care | Payer: PPO | Source: Ambulatory Visit | Attending: Neurosurgery | Admitting: Neurosurgery

## 2018-08-29 DIAGNOSIS — S066X0A Traumatic subarachnoid hemorrhage without loss of consciousness, initial encounter: Secondary | ICD-10-CM | POA: Diagnosis not present

## 2018-08-29 DIAGNOSIS — S069X0A Unspecified intracranial injury without loss of consciousness, initial encounter: Secondary | ICD-10-CM

## 2018-08-29 MED ORDER — GADOBENATE DIMEGLUMINE 529 MG/ML IV SOLN
15.0000 mL | Freq: Once | INTRAVENOUS | Status: AC | PRN
  Administered 2018-08-29: 15 mL via INTRAVENOUS

## 2018-09-02 ENCOUNTER — Ambulatory Visit
Admission: RE | Admit: 2018-09-02 | Discharge: 2018-09-02 | Disposition: A | Discharge status: Home / Self Care | Payer: PPO | Source: Ambulatory Visit | Attending: Thoracic Surgery (Cardiothoracic Vascular Surgery) | Admitting: Thoracic Surgery (Cardiothoracic Vascular Surgery)

## 2018-09-02 ENCOUNTER — Ambulatory Visit (INDEPENDENT_AMBULATORY_CARE_PROVIDER_SITE_OTHER): Payer: Self-pay | Admitting: Thoracic Surgery (Cardiothoracic Vascular Surgery)

## 2018-09-02 ENCOUNTER — Other Ambulatory Visit: Payer: Self-pay | Admitting: Thoracic Surgery (Cardiothoracic Vascular Surgery)

## 2018-09-02 ENCOUNTER — Other Ambulatory Visit: Payer: Self-pay

## 2018-09-02 VITALS — BP 109/72 | HR 86 | Resp 16 | Ht 66.0 in | Wt 156.0 lb

## 2018-09-02 DIAGNOSIS — Z09 Encounter for follow-up examination after completed treatment for conditions other than malignant neoplasm: Secondary | ICD-10-CM

## 2018-09-02 DIAGNOSIS — J939 Pneumothorax, unspecified: Secondary | ICD-10-CM | POA: Diagnosis not present

## 2018-09-02 DIAGNOSIS — R911 Solitary pulmonary nodule: Secondary | ICD-10-CM

## 2018-09-02 DIAGNOSIS — J8410 Pulmonary fibrosis, unspecified: Secondary | ICD-10-CM
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
## 2018-09-02 NOTE — Patient Instructions (Addendum)
Thank you for visiting Dr. Valeta Harms at [facility]. Today we recommend the following:  Continue your current inhaler regimen.  Please let us know if your respiratory symptoms worsen.  Return in about 1 year (around 09/04/2019), or if symptoms worsen or fail to improve.
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
## 2018-09-03 ENCOUNTER — Encounter: Payer: Self-pay | Admitting: Pulmonary Disease

## 2018-09-03 ENCOUNTER — Ambulatory Visit (INDEPENDENT_AMBULATORY_CARE_PROVIDER_SITE_OTHER): Payer: PPO | Admitting: Pulmonary Disease

## 2018-09-03 VITALS — BP 110/80 | HR 76 | Wt 159.2 lb

## 2018-09-03 DIAGNOSIS — S069X0A Unspecified intracranial injury without loss of consciousness, initial encounter: Secondary | ICD-10-CM | POA: Diagnosis not present

## 2018-09-03 DIAGNOSIS — J4520 Mild intermittent asthma, uncomplicated: Secondary | ICD-10-CM | POA: Diagnosis not present

## 2018-09-03 DIAGNOSIS — J309 Allergic rhinitis, unspecified: Secondary | ICD-10-CM | POA: Diagnosis not present

## 2018-09-03 DIAGNOSIS — R911 Solitary pulmonary nodule: Secondary | ICD-10-CM

## 2018-09-09 LAB — FUNGUS CULTURE WITH STAIN

## 2018-09-09 LAB — FUNGAL ORGANISM REFLEX

## 2018-09-09 LAB — FUNGUS CULTURE RESULT

## 2018-09-11 ENCOUNTER — Ambulatory Visit (INDEPENDENT_AMBULATORY_CARE_PROVIDER_SITE_OTHER): Payer: PPO | Admitting: Cardiology

## 2018-09-11 ENCOUNTER — Encounter: Payer: Self-pay | Admitting: Cardiology

## 2018-09-11 VITALS — BP 116/60 | HR 96 | Ht 60.0 in | Wt 158.8 lb

## 2018-09-11 DIAGNOSIS — R911 Solitary pulmonary nodule: Secondary | ICD-10-CM | POA: Diagnosis not present

## 2018-09-11 DIAGNOSIS — S0990XA Unspecified injury of head, initial encounter: Secondary | ICD-10-CM | POA: Insufficient documentation

## 2018-09-11 DIAGNOSIS — S0990XS Unspecified injury of head, sequela: Secondary | ICD-10-CM | POA: Diagnosis not present

## 2018-09-11 DIAGNOSIS — I480 Paroxysmal atrial fibrillation: Secondary | ICD-10-CM

## 2018-09-11 MED ORDER — AMIODARONE HCL 200 MG PO TABS: 200.0000 mg | ORAL_TABLET | Freq: Every day | ORAL | 3 refills | Status: DC

## 2018-09-11 NOTE — Assessment & Plan Note (Signed)
S/p VATS 08/11/18 with Wedge Resection, Pathology + Necrotizing Granuloma, Cx Negative
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
## 2018-09-11 NOTE — Patient Instructions (Addendum)
Medication Instructions:  DECREASE Amiodarone to 200mg  Take 1 tablet once a day. If you need a refill on your cardiac medications before your next appointment, please call your pharmacy.   Lab work: None If you have labs (blood work) drawn today and your tests are completely normal, you will receive your results only by: [name] MyChart Message (if you have MyChart) OR . A paper copy in the mail If you have any lab test that is abnormal or we need to change your treatment, we will call you to review the results.  Testing/Procedures: None   Follow-Up: At [facility], you and your health needs are our priority.  As part of our continuing mission to provide you with exceptional heart care, we have created designated Provider Care Teams.  These Care Teams include your primary Cardiologist (physician) and Advanced Practice Providers (APPs -  Physician Assistants and Nurse Practitioners) who all work together to provide you with the care you need, when you need it.  Your physician recommends that you schedule a follow-up appointment in: 2-3 months with Dr Gwenlyn Found.   Any Other Special Instructions Will Be Listed Below (If Applicable).

## 2018-09-16 DIAGNOSIS — R350 Frequency of micturition: Secondary | ICD-10-CM | POA: Diagnosis not present

## 2018-09-16 DIAGNOSIS — N815 Vaginal enterocele: Secondary | ICD-10-CM | POA: Diagnosis not present

## 2018-09-19 ENCOUNTER — Ambulatory Visit: Payer: Self-pay | Admitting: *Deleted

## 2018-09-19 NOTE — Telephone Encounter (Signed)
Seeing you Monday at 8:45
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
## 2018-09-22 ENCOUNTER — Ambulatory Visit (INDEPENDENT_AMBULATORY_CARE_PROVIDER_SITE_OTHER): Payer: PPO | Admitting: Internal Medicine

## 2018-09-22 ENCOUNTER — Encounter: Payer: Self-pay | Admitting: Internal Medicine

## 2018-09-22 VITALS — BP 142/90 | HR 81 | Temp 98.4°F | Resp 16 | Ht 60.0 in | Wt 162.0 lb

## 2018-09-22 DIAGNOSIS — R253 Fasciculation: Secondary | ICD-10-CM | POA: Insufficient documentation

## 2018-09-22 DIAGNOSIS — Z23 Encounter for immunization: Secondary | ICD-10-CM | POA: Diagnosis not present

## 2018-09-22 NOTE — Patient Instructions (Addendum)
  Flu immunization administered today.    Medications reviewed and updated.  Changes include :   none   A referral was ordered for neurology     Please followup in January as scheduled

## 2018-09-22 NOTE — Assessment & Plan Note (Signed)
Intermittent jerking of left arm x months - worse in past month after LUL nodule removed No weakness on exam, no witnessed jerking Also has shakes in left hand and subjective weakness Refer to neurology for further evaluation

## 2018-09-23 LAB — ACID FAST CULTURE WITH REFLEXED SENSITIVITIES (MYCOBACTERIA): Acid Fast Culture: NEGATIVE
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
## 2018-09-29 ENCOUNTER — Other Ambulatory Visit: Payer: Self-pay

## 2018-09-29 ENCOUNTER — Encounter: Payer: Self-pay | Admitting: Physical Medicine & Rehabilitation

## 2018-09-29 ENCOUNTER — Encounter: Payer: PPO | Attending: Physical Medicine & Rehabilitation | Admitting: Physical Medicine & Rehabilitation

## 2018-09-29 VITALS — BP 103/71 | HR 96 | Ht 60.0 in | Wt 162.4 lb

## 2018-09-29 DIAGNOSIS — Z9889 Other specified postprocedural states: Secondary | ICD-10-CM | POA: Insufficient documentation

## 2018-09-29 DIAGNOSIS — I480 Paroxysmal atrial fibrillation: Secondary | ICD-10-CM | POA: Diagnosis not present

## 2018-09-29 DIAGNOSIS — M797 Fibromyalgia: Secondary | ICD-10-CM | POA: Insufficient documentation

## 2018-09-29 DIAGNOSIS — R51 Headache: Secondary | ICD-10-CM | POA: Insufficient documentation

## 2018-09-29 DIAGNOSIS — S066X0A Traumatic subarachnoid hemorrhage without loss of consciousness, initial encounter: Secondary | ICD-10-CM | POA: Diagnosis not present

## 2018-09-29 DIAGNOSIS — R251 Tremor, unspecified: Secondary | ICD-10-CM | POA: Insufficient documentation

## 2018-09-29 DIAGNOSIS — S069X3S Unspecified intracranial injury with loss of consciousness of 1 hour to 5 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, sequela: Secondary | ICD-10-CM | POA: Diagnosis not present

## 2018-09-29 DIAGNOSIS — R531 Weakness: Secondary | ICD-10-CM | POA: Insufficient documentation

## 2018-09-29 DIAGNOSIS — Z8249 Family history of ischemic heart disease and other diseases of the circulatory system: Secondary | ICD-10-CM | POA: Insufficient documentation

## 2018-09-29 DIAGNOSIS — M533 Sacrococcygeal disorders, not elsewhere classified: Secondary | ICD-10-CM

## 2018-09-29 DIAGNOSIS — R911 Solitary pulmonary nodule: Secondary | ICD-10-CM | POA: Diagnosis not present
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
## 2018-09-29 NOTE — Patient Instructions (Signed)
PLEASE FEEL FREE TO CALL OUR OFFICE WITH ANY PROBLEMS OR QUESTIONS ([phone])      

## 2018-09-30 ENCOUNTER — Ambulatory Visit (INDEPENDENT_AMBULATORY_CARE_PROVIDER_SITE_OTHER): Payer: PPO | Admitting: Neurology

## 2018-09-30 ENCOUNTER — Encounter: Payer: Self-pay | Admitting: Neurology

## 2018-09-30 ENCOUNTER — Other Ambulatory Visit (INDEPENDENT_AMBULATORY_CARE_PROVIDER_SITE_OTHER): Payer: PPO

## 2018-09-30 ENCOUNTER — Ambulatory Visit: Payer: PPO | Admitting: Internal Medicine

## 2018-09-30 VITALS — BP 110/70 | HR 84 | Ht 67.0 in | Wt 161.0 lb

## 2018-09-30 DIAGNOSIS — E0590 Thyrotoxicosis, unspecified without thyrotoxic crisis or storm: Secondary | ICD-10-CM

## 2018-09-30 DIAGNOSIS — R251 Tremor, unspecified: Secondary | ICD-10-CM | POA: Diagnosis not present

## 2018-09-30 DIAGNOSIS — R292 Abnormal reflex: Secondary | ICD-10-CM

## 2018-09-30 DIAGNOSIS — I609 Nontraumatic subarachnoid hemorrhage, unspecified: Secondary | ICD-10-CM

## 2018-09-30 LAB — TSH: TSH: 1 mIU/L (ref 0.40–4.50)

## 2018-09-30 NOTE — Patient Instructions (Signed)
1. Your provider has requested that you have labwork completed today. Please go to [facility] ([id number]) on the second floor of this building before leaving the office today. You do not need to check in. If you are not called within 15 minutes please check with the front desk.   

## 2018-10-01 ENCOUNTER — Telehealth: Payer: Self-pay | Admitting: Neurology

## 2018-10-01 NOTE — Telephone Encounter (Signed)
Mychart message sent to patient.

## 2018-10-01 NOTE — Telephone Encounter (Signed)
-----   Message from Jean Lafitte, DO sent at 10/01/2018  9:07 AM EST ----- Let pt know that tsh was improved and looked ok

## 2018-10-20 ENCOUNTER — Telehealth: Payer: Self-pay | Admitting: Cardiology

## 2018-10-20 NOTE — Telephone Encounter (Signed)
I looked at her medication list did not did not see amiodarone

## 2018-10-20 NOTE — Telephone Encounter (Signed)
Pt last seen by Kerin Ransom on 10/12  PLAN  I'll discuss duration af Amiodarone Rx with Dr Gwenlyn Found. I would resume Lopressor when she comes off Amiodarone.   Kerin Ransom PA-C 09/11/2018 10:54 AM  Routed to [facility] and Dr. Gwenlyn Found to update on pt status and awaiting recommendation

## 2018-10-20 NOTE — Telephone Encounter (Signed)
Pt c/o medication issue:  1. Name of [place]  2. How are you currently taking this medication (dosage and times per day)? 1 tablet a day  3. Are you having a reaction (difficulty breathing--STAT)? some  4. What is your medication issue?  Hands and arms shaking a lot

## 2018-10-21 MED ORDER — METOPROLOL TARTRATE 25 MG PO TABS: 12.5000 mg | ORAL_TABLET | Freq: Two times a day (BID) | ORAL | 1 refills | Status: DC

## 2018-10-21 NOTE — Telephone Encounter (Signed)
Stop Amiodarone- start Lopressor 12.5 mg BID, keep f/u with Dr Gwenlyn Found in Jan as scheduled. She should let us know if she has recurrent tachycardia.  Kerin Ransom PA-C 10/21/2018 8:18 AM

## 2018-10-21 NOTE — Telephone Encounter (Signed)
Patient called with PA recommendations. She agreed with plan. Rx(s) sent to pharmacy electronically. She expressed concern about her HR and her potentially getting out of rhythm and wondered if she needed another monitor. Provided patient with info about [employer] via MyChart message which she seemed interested in.

## 2018-11-03 ENCOUNTER — Other Ambulatory Visit: Payer: Self-pay | Admitting: Allergy & Immunology

## 2018-11-03 DIAGNOSIS — N815 Vaginal enterocele: Secondary | ICD-10-CM | POA: Diagnosis not present

## 2018-11-03 DIAGNOSIS — J302 Other seasonal allergic rhinitis: Secondary | ICD-10-CM

## 2018-11-03 DIAGNOSIS — N819 Female genital prolapse, unspecified: Secondary | ICD-10-CM | POA: Diagnosis not present

## 2018-11-03 DIAGNOSIS — J3089 Other allergic rhinitis: Secondary | ICD-10-CM

## 2018-11-05 ENCOUNTER — Telehealth: Payer: Self-pay | Admitting: Cardiovascular Disease

## 2018-11-05 NOTE — Telephone Encounter (Signed)
Received Office Visit from [facility] Appt 11/25/18 @ 10:15AM. [place]

## 2018-11-06 DIAGNOSIS — Z01419 Encounter for gynecological examination (general) (routine) without abnormal findings: Secondary | ICD-10-CM | POA: Diagnosis not present

## 2018-11-06 DIAGNOSIS — Z1231 Encounter for screening mammogram for malignant neoplasm of breast: Secondary | ICD-10-CM | POA: Diagnosis not present

## 2018-11-06 DIAGNOSIS — N958 Other specified menopausal and perimenopausal disorders: Secondary | ICD-10-CM | POA: Diagnosis not present

## 2018-11-06 DIAGNOSIS — Z6827 Body mass index (BMI) 27.0-27.9, adult: Secondary | ICD-10-CM | POA: Diagnosis not present

## 2018-11-06 DIAGNOSIS — M8588 Other specified disorders of bone density and structure, other site: Secondary | ICD-10-CM | POA: Diagnosis not present

## 2018-11-07 ENCOUNTER — Telehealth: Payer: Self-pay | Admitting: Cardiovascular Disease

## 2018-11-07 ENCOUNTER — Other Ambulatory Visit: Payer: Self-pay | Admitting: Urology

## 2018-11-07 NOTE — Telephone Encounter (Signed)
New message      [facility] Pre-operative Risk Assessment    Request for surgical clearance:  1. What type of surgery is being performed? Robotic Sacrocolpopexy   2. When is this surgery scheduled? 12/17/2018  3. What type of clearance is required (medical clearance vs. Pharmacy clearance to hold med vs. Both)? both  4. Are there any medications that need to be held prior to surgery and how long? Will leave up to cardiologist   5. Practice name and name of physician performing surgery? [facility], Dr. Louis Meckel   6. What is your office phone number [phone] ext [phone]   7.   What is your office fax number [phone]  8.   Anesthesia type (None, local, MAC, general) ? General    Maryjane Hurter 11/07/2018, 3:09 PM  _________________________________________________________________   (provider comments below)

## 2018-11-07 NOTE — Telephone Encounter (Signed)
   Primary Cardiologist:Jonathan Gwenlyn Found, MD  Chart reviewed as part of pre-operative protocol coverage. Because of Kara Ramirez's past medical history and time since last visit, he/she will require a follow-up visit in order to better assess preoperative cardiovascular risk.   She has a f/u appt scheduled with Dr. Gwenlyn Found on 11/25/17. Clearance will be addressed at that visit.   Pre-op covering staff: - Please schedule appointment and call patient to inform them. - Please contact requesting surgeon's office via preferred method (i.e, phone, fax) to inform them of need for appointment prior to surgery.  If applicable, this message will also be routed to pharmacy pool and/or primary cardiologist for input on holding anticoagulant/antiplatelet agent as requested below so that this information is available at time of patient's appointment.   [name], PA-C  11/07/2018, 4:46 PM

## 2018-11-07 NOTE — Telephone Encounter (Signed)
Correction. Office visit with Dr. Gwenlyn Found is 11/25/2018.

## 2018-11-10 NOTE — Telephone Encounter (Signed)
Patient was called and notified to come to that appointment for clearance.  [facility] was called, left a message for Pam the surgery coordinator and advised of the upcoming plan to be cleared at the office visit. Gave call back number if questions.

## 2018-11-25 ENCOUNTER — Ambulatory Visit: Payer: PPO | Admitting: Cardiovascular Disease

## 2018-11-25 ENCOUNTER — Encounter: Payer: Self-pay | Admitting: Cardiovascular Disease

## 2018-11-25 DIAGNOSIS — I480 Paroxysmal atrial fibrillation: Secondary | ICD-10-CM

## 2018-11-25 DIAGNOSIS — E782 Mixed hyperlipidemia: Secondary | ICD-10-CM

## 2018-11-25 NOTE — Patient Instructions (Addendum)
Medication Instructions:  NO CHANGES If you need a refill on your cardiac medications before your next appointment, please call your pharmacy.   Lab work: NONE If you have labs (blood work) drawn today and your tests are completely normal, you will receive your results only by: [name] MyChart Message (if you have MyChart) OR . A paper copy in the mail If you have any lab test that is abnormal or we need to change your treatment, we will call you to review the results.  Testing/Procedures: NONE  Follow-Up: At [facility], you and your health needs are our priority.  As part of our continuing mission to provide you with exceptional heart care, we have created designated Provider Care Teams.  These Care Teams include your primary Cardiologist (physician) and Advanced Practice Providers (APPs -  Physician Assistants and Nurse Practitioners) who all work together to provide you with the care you need, when you need it. You will need a follow up appointment in 6 months with Kerin Ransom, PA and 12 months with Dr. Gwenlyn Found.  Please call our office 2 months in advance to schedule each appointment.    ADDITIONAL INFORMATION: YOU HAVE BEEN CLEARED FROM A CARDIAC STANDPOINT FOR YOUR UPCOMING PROCEDURE/SURGERY.

## 2018-11-25 NOTE — Assessment & Plan Note (Signed)
History of PAF in the past on amiodarone in the past and low-dose beta-blocker now.  She currently is not on oral anticoagulation.

## 2018-11-25 NOTE — Progress Notes (Signed)
11/25/2018 Kara Ramirez   06/17/1951  [id number]  Primary Physician Kara Rail, MD Primary Cardiologist: Kara Harp MD Kara Ramirez, [place]  HPI:  Kara Ramirez is a 68 y.o. mildly overweight married Caucasian female mother of 2, grandmother of 1 grandchild whose husband Kara Ramirez is also patient mild is accompanying her today. She is retired from working in [employer].  I last saw her in the office 10/30/2017. Her primary care physician is Dr. Billey Ramirez. I last saw her in the office 05/27/15. She really has no risk factors other than mild hyperlipidemia on red yeast rice. Her sister did have myocardial infarctions. She's had chest pain off and on for a year which is somewhat atypical. It occurs typically last for minutes at a time with occasional associated shortness of breath. Performed 2-D echocardiography and Myoview stress testing July 2016 which were normal. She was recently seen at [facility] in September with chest pain and shortness of breath. A dobutamine echo was normal. She saw Kara [facility] in the  office 09/02/17 who ordered an event monitor that did show some brief runs of PAF.    Since I saw her back a year ago she is remained stable.  She did see Kara Ramirez in the office 09/11/2018 after a VATS S procedure by Dr. Roxan Ramirez who released her.  This apparently was nonmalignant.  Did have a coronary CTA procedure performed 12/10/2017 that showed minor CAD and at that time an incidental lung nodule was found.  She denies chest pain or shortness of breath.   Current Meds  Medication Sig  . acetaminophen (TYLENOL) 325 MG tablet Take 1-2 tablets (325-650 mg total) by mouth every 4 (four) hours as needed for mild pain.  [name] albuterol (PROAIR HFA) 108 (90 Base) MCG/ACT inhaler INHALE 2 PUFFS INTO THE LUNGS EVERY 4 HOURS AS NEEDED FOR COUGH OR WHEEZE. MAY USE 2 PUFFS 10 TO 20 MINUTES PRIOR TO EXERCISE  . AMBULATORY NON FORMULARY MEDICATION Place 1 application rectally 3  (three) times daily as needed (for fissures). Diltiazem 2% gel with Lidocaine 5% Apply a pea sized amount internally three times daily. Dispense 30 GM zero refill  . Calcium Carbonate-Vitamin D (CALCIUM 600+D) 600-400 MG-UNIT per tablet Take 1 tablet by mouth daily.   . Carboxymethylcellul-Glycerin (REFRESH OPTIVE OP) Place 1 drop into both eyes 3 (three) times daily as needed (dry eyes).  . cholecalciferol (VITAMIN D) 1000 UNITS tablet Take 1,000 Units by mouth daily.    [name] Co-Enzyme Q-10 100 MG CAPS Take 100 mg by mouth.  . docusate sodium (COLACE) 100 MG capsule Take 1 capsule (100 mg total) by mouth 2 (two) times daily. Is available over the counter. (Patient taking differently: Take 100 mg by mouth 2 (two) times daily as needed (for constipation.). )  . fluocinonide ointment (LIDEX) 4.09 % Apply 1 application topically See admin instructions. Use twice daily cyclically with tacrolimus ointment alternating between both creams every 5 days  . fluticasone (FLONASE) 50 MCG/ACT nasal spray USE 1 SPRAY IN EACH NOSTRIL MID-DAY FOR CONGESTION  . fluticasone (FLOVENT HFA) 110 MCG/ACT inhaler Inhale 2 puffs into the lungs 2 (two) times daily.  [name] ipratropium (ATROVENT) 0.03 % nasal spray Place 1 spray into both nostrils every 6 (six) hours as needed (runny nose).   [name] ketotifen (ZADITOR) 0.025 % ophthalmic solution Place 1 drop into both eyes 2 (two) times daily.   [name] loratadine (CLARITIN) 10 MG tablet Take 10  mg by mouth daily.    [name] MALIC ACID PO Take 1 capsule by mouth at bedtime.  . metoprolol tartrate (LOPRESSOR) 25 MG tablet Take 0.5 tablets (12.5 mg total) by mouth 2 (two) times daily.  . naproxen sodium (ALEVE) 220 MG tablet Take 220 mg by mouth at bedtime.   . Omega-3 Fatty Acids (FISH OIL) 1000 MG CAPS Take 1,000 mg by mouth at bedtime.   . sodium chloride (OCEAN) 0.65 % SOLN nasal spray Place 1 spray into both nostrils as needed for congestion. (Patient taking differently: Place 1 spray into both  nostrils 4 (four) times daily as needed for congestion (scheduled every morning.). )  . tacrolimus (PROTOPIC) 0.1 % ointment Apply 1 application topically 2 (two) times daily as needed (PRN skin issues). (Patient taking differently: Apply 1 application topically See admin instructions. Use twice daily cyclically with fluocinonide  ointment alternating between both creams every 5 days)  . Thiamine HCl (VITAMIN B-1) 100 MG tablet Take 100 mg by mouth daily.       Allergies  Allergen Reactions  . Mold Extract [Trichophyton Mentagrophyte] Shortness Of Breath and Rash  . Penicillins Shortness Of Breath and Rash    Eyes puffy Has taken low dose pcn and no rx REACTION: rash, SOB Has patient had a PCN reaction causing immediate rash, facial/tongue/throat swelling, SOB or lightheadedness with hypotension: yes Has patient had a PCN reaction causing severe rash involving mucus membranes or skin necrosis: unk Has patient had a PCN reaction that required hospitalization: no Has patient had a PCN reaction occurring within the last 10 years: unk If all of the above answers are "NO", then may proceed with Cephalospor  . Wheat Shortness Of Breath    Tightness in chest  . Wheat Bran Anaphylaxis  . Morphine Other (See Comments)    REACTION: tachycardia and anxiety  . Peanut Oil Nausea And Vomiting    Peanut butter  . Protonix [Pantoprazole Sodium] Nausea And Vomiting  . Citrus   . Peanut-Containing Drug Products   . Tramadol     Makes crazy;confused  . Valium [Diazepam]     Confusion per family  . Cetirizine Rash    Around face  . Codeine Other (See Comments)    REACTION: dizzy and "groggy in my head"  . Eggs Or Egg-Derived Products Nausea And Vomiting  . Latex Itching  . Pentazocine Lactate Palpitations and Other (See Comments)    Social History   Socioeconomic History  . Marital status: Married    Spouse name: Not on file  . Number of children: 2  . Years of education: Not on file  .  Highest education level: Not on file  Occupational History  . Occupation: retired    Fish farm manager: [employer]  Social Needs  . Financial resource strain: Not hard at all  . Food insecurity:    Worry: Never true    Inability: Never true  . Transportation needs:    Medical: No    Non-medical: No  Tobacco Use  . Smoking status: Never Smoker  . Smokeless tobacco: Never Used  Substance and Sexual Activity  . Alcohol use: Never    Alcohol/week: 0.0 standard drinks    Frequency: Never  . Drug use: Never  . Sexual activity: Not Currently  Lifestyle  . Physical activity:    Days per week: 4 days    Minutes per session: 40 min  . Stress: To some extent  Relationships  . Social connections:  Talks on phone: More than three times a week    Gets together: More than three times a week    Attends religious service: Not on file    Active member of club or organization: Not on file    Attends meetings of clubs or organizations: Not on file    Relationship status: Married  . Intimate partner violence:    Fear of current or ex partner: No    Emotionally abused: No    Physically abused: No    Forced sexual activity: No  Other Topics Concern  . Not on file  Social History Narrative  . Not on file     Review of Systems: General: negative for chills, fever, night sweats or weight changes.  Cardiovascular: negative for chest pain, dyspnea on exertion, edema, orthopnea, palpitations, paroxysmal nocturnal dyspnea or shortness of breath Dermatological: negative for rash Respiratory: negative for cough or wheezing Urologic: negative for hematuria Abdominal: negative for nausea, vomiting, diarrhea, bright red blood per rectum, melena, or hematemesis Neurologic: negative for visual changes, syncope, or dizziness All other systems reviewed and are otherwise negative except as noted above.    Blood pressure 92/60, pulse 79, height 5\' 7"  (1.702 m), weight 165 lb (74.8 kg).    General appearance: alert and no distress Neck: no adenopathy, no carotid bruit, no JVD, supple, symmetrical, trachea midline and thyroid not enlarged, symmetric, no tenderness/mass/nodules Lungs: clear to auscultation bilaterally Heart: regular rate and rhythm, S1, S2 normal, no murmur, click, rub or gallop Extremities: extremities normal, atraumatic, no cyanosis or edema Pulses: 2+ and symmetric Skin: Skin color, texture, turgor normal. No rashes or lesions Neurologic: Alert and oriented X 3, normal strength and tone. Normal symmetric reflexes. Normal coordination and gait  EKG not performed today  ASSESSMENT AND PLAN:   Hyperlipidemia History of hyperlipidemia not on statin therapy with lipid profile performed 12/11/2017 revealing total cholesterol 159, LDL 72 and HDL 72.  Paroxysmal atrial fibrillation (HCC) History of PAF in the past on amiodarone in the past and low-dose beta-blocker now.  She currently is not on oral anticoagulation.      Kara Harp MD FACP,FACC,FAHA, [facility] 11/25/2018 11:29 AM

## 2018-11-25 NOTE — Assessment & Plan Note (Signed)
History of hyperlipidemia not on statin therapy with lipid profile performed 12/11/2017 revealing total cholesterol 159, LDL 72 and HDL 72.

## 2018-12-02 DIAGNOSIS — N819 Female genital prolapse, unspecified: Secondary | ICD-10-CM | POA: Diagnosis not present
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
## 2018-12-11 NOTE — Progress Notes (Signed)
12/05/2018- noted in [place] visit from Dr. Gwenlyn Found   09/12/2018- noted in [place]- EKG  09/02/2018- noted in Epic-CXR  08/20/22019- noted in [place]- PFT  08/08/2017- noted in [place]

## 2018-12-11 NOTE — Patient Instructions (Addendum)
Kara Ramirez  12/11/2018   Your procedure is scheduled on: Wednesday  12/17/2018  Report to [facility] Main  Entrance              Report to admitting at  0530  AM    Call this number if you have problems the morning of surgery [phone]               Follow Bowel prep instructions from Dr. Louis Meckel on Tuesday 12/16/2018 and drink clear liquids all day up to midnight!    CLEAR LIQUID DIET   Foods Allowed                                                                     Foods Excluded  Coffee and tea, regular and decaf                             liquids that you cannot  Plain Jell-O in any flavor                                             see through such as: Fruit ices (not with fruit pulp)                                     milk, soups, orange juice  Iced Popsicles                                    All solid food Carbonated beverages, regular and diet                                    Cranberry, grape and apple juices Sports drinks like Gatorade Lightly seasoned clear broth or consume(fat free) Sugar, honey syrup  Sample Menu Breakfast                                Lunch                                     Supper Cranberry juice                    Beef broth                            Chicken broth Jell-O                                     Grape juice  Apple juice Coffee or tea                        Jell-O                                      Popsicle                                                Coffee or tea                        Coffee or tea  _____________________________________________________________________     Remember: Do not eat food or drink liquids :After Midnight.              BRUSH YOUR TEETH MORNING OF SURGERY AND RINSE YOUR MOUTH OUT, NO CHEWING GUM CANDY OR MINTS.     Take these medicines the morning of surgery with A SIP OF WATER: Metoprolol tartrate (Lopressro), Loratidine (Claritin)  use Flonase nasal spray, use Atrovent inhaler, use Flovent inhaler and use Albuterol inhaler if needed and bring inhalers with you to the hospital.                                You may not have any metal on your body including hair pins and              piercings  Do not wear jewelry, make-up, lotions, powders or perfumes, deodorant             Do not wear nail polish.  Do not shave  48 hours prior to surgery.                Do not bring valuables to the hospital. [place].  Contacts, dentures or bridgework may not be worn into surgery.  Leave suitcase in the car. After surgery it may be brought to your room.                   Please read over the following fact sheets you were given: _____________________________________________________________________             [facility] - Preparing for Surgery Before surgery, you can play an important role.  Because skin is not sterile, your skin needs to be as free of germs as possible.  You can reduce the number of germs on your skin by washing with CHG (chlorahexidine gluconate) soap before surgery.  CHG is an antiseptic cleaner which kills germs and bonds with the skin to continue killing germs even after washing. Please DO NOT use if you have an allergy to CHG or antibacterial soaps.  If your skin becomes reddened/irritated stop using the CHG and inform your nurse when you arrive at Short Stay. Do not shave (including legs and underarms) for at least 48 hours prior to the first CHG shower.  You may shave your face/neck. Please follow these instructions carefully:  1.  Shower with CHG Soap the night before surgery and the  morning of Surgery.  2.  If you choose to  wash your hair, wash your hair first as usual with your  normal  shampoo.  3.  After you shampoo, rinse your hair and body thoroughly to remove the  shampoo.                           4.  Use CHG as you would any other liquid soap.   You can apply chg directly  to the skin and wash                       Gently with a scrungie or clean washcloth.  5.  Apply the CHG Soap to your body ONLY FROM THE NECK DOWN.   Do not use on face/ open                           Wound or open sores. Avoid contact with eyes, ears mouth and genitals (private parts).                       Wash face,  Genitals (private parts) with your normal soap.             6.  Wash thoroughly, paying special attention to the area where your surgery  will be performed.  7.  Thoroughly rinse your body with warm water from the neck down.  8.  DO NOT shower/wash with your normal soap after using and rinsing off  the CHG Soap.                9.  Pat yourself dry with a clean towel.            10.  Wear clean pajamas.            11.  Place clean sheets on your bed the night of your first shower and do not  sleep with pets. Day of Surgery : Do not apply any lotions/deodorants the morning of surgery.  Please wear clean clothes to the hospital/surgery center.  FAILURE TO FOLLOW THESE INSTRUCTIONS MAY RESULT IN THE CANCELLATION OF YOUR SURGERY PATIENT SIGNATURE_________________________________  NURSE SIGNATURE__________________________________  ________________________________________________________________________

## 2018-12-11 NOTE — Patient Instructions (Addendum)
Tests ordered today. Your results will be released to [place] (or called to you) after review, usually within 72hours after test completion. If any changes need to be made, you will be notified at that same time.  All other Health Maintenance issues reviewed.   All recommended immunizations and age-appropriate screenings are up-to-date or discussed.  No immunizations administered today.   Medications reviewed and updated.  Changes include :   none   Please followup in 6 months   Health Maintenance, Female Adopting a healthy lifestyle and getting preventive care can go a long way to promote health and wellness. Talk with your health care provider about what schedule of regular examinations is right for you. This is a good chance for you to check in with your provider about disease prevention and staying healthy. In between checkups, there are plenty of things you can do on your own. Experts have done a lot of research about which lifestyle changes and preventive measures are most likely to keep you healthy. Ask your health care provider for more information. Weight and diet Eat a healthy diet  Be sure to include plenty of vegetables, fruits, low-fat dairy products, and lean protein.  Do not eat a lot of foods high in solid fats, added sugars, or salt.  Get regular exercise. This is one of the most important things you can do for your health. ? Most adults should exercise for at least 150 minutes each week. The exercise should increase your heart rate and make you sweat (moderate-intensity exercise). ? Most adults should also do strengthening exercises at least twice a week. This is in addition to the moderate-intensity exercise. Maintain a healthy weight  Body mass index (BMI) is a measurement that can be used to identify possible weight problems. It estimates body fat based on height and weight. Your health care provider can help determine your BMI and help you achieve or maintain a  healthy weight.  For females 58 years of age and older: ? A BMI below 18.5 is considered underweight. ? A BMI of 18.5 to 24.9 is normal. ? A BMI of 25 to 29.9 is considered overweight. ? A BMI of 30 and above is considered obese. Watch levels of cholesterol and blood lipids  You should start having your blood tested for lipids and cholesterol at 69 years of age, then have this test every 5 years.  You may need to have your cholesterol levels checked more often if: ? Your lipid or cholesterol levels are high. ? You are older than 69 years of age. ? You are at high risk for heart disease. Cancer screening Lung Cancer  Lung cancer screening is recommended for adults 69-80 years old who are at high risk for lung cancer because of a history of smoking.  A yearly low-dose CT scan of the lungs is recommended for people who: ? Currently smoke. ? Have quit within the past 15 years. ? Have at least a 30-pack-year history of smoking. A pack year is smoking an average of one pack of cigarettes a day for 1 year.  Yearly screening should continue until it has been 15 years since you quit.  Yearly screening should stop if you develop a health problem that would prevent you from having lung cancer treatment. Breast Cancer  Practice breast self-awareness. This means understanding how your breasts normally appear and feel.  It also means doing regular breast self-exams. Let your health care provider know about any changes, no matter how small.  If you are in your 20s or 30s, you should have a clinical breast exam (CBE) by a health care provider every 1-3 years as part of a regular health exam.  If you are 40 or older, have a CBE every year. Also consider having a breast X-ray (mammogram) every year.  If you have a family history of breast cancer, talk to your health care provider about genetic screening.  If you are at high risk for breast cancer, talk to your health care provider about having  an MRI and a mammogram every year.  Breast cancer gene (BRCA) assessment is recommended for women who have family members with BRCA-related cancers. BRCA-related cancers include: ? Breast. ? Ovarian. ? Tubal. ? Peritoneal cancers.  Results of the assessment will determine the need for genetic counseling and BRCA1 and BRCA2 testing. Cervical Cancer Your health care provider may recommend that you be screened regularly for cancer of the pelvic organs (ovaries, uterus, and vagina). This screening involves a pelvic examination, including checking for microscopic changes to the surface of your cervix (Pap test). You may be encouraged to have this screening done every 3 years, beginning at age 21.  For women ages 30-65, health care providers may recommend pelvic exams and Pap testing every 3 years, or they may recommend the Pap and pelvic exam, combined with testing for human papilloma virus (HPV), every 5 years. Some types of HPV increase your risk of cervical cancer. Testing for HPV may also be done on women of any age with unclear Pap test results.  Other health care providers may not recommend any screening for nonpregnant women who are considered low risk for pelvic cancer and who do not have symptoms. Ask your health care provider if a screening pelvic exam is right for you.  If you have had past treatment for cervical cancer or a condition that could lead to cancer, you need Pap tests and screening for cancer for at least 20 years after your treatment. If Pap tests have been discontinued, your risk factors (such as having a new sexual partner) need to be reassessed to determine if screening should resume. Some women have medical problems that increase the chance of getting cervical cancer. In these cases, your health care provider may recommend more frequent screening and Pap tests. Colorectal Cancer  This type of cancer can be detected and often prevented.  Routine colorectal cancer screening  usually begins at 69 years of age and continues through 69 years of age.  Your health care provider may recommend screening at an earlier age if you have risk factors for colon cancer.  Your health care provider may also recommend using home test kits to check for hidden blood in the stool.  A small camera at the end of a tube can be used to examine your colon directly (sigmoidoscopy or colonoscopy). This is done to check for the earliest forms of colorectal cancer.  Routine screening usually begins at age 50.  Direct examination of the colon should be repeated every 5-10 years through 69 years of age. However, you may need to be screened more often if early forms of precancerous polyps or small growths are found. Skin Cancer  Check your skin from head to toe regularly.  Tell your health care provider about any new moles or changes in moles, especially if there is a change in a mole's shape or color.  Also tell your health care provider if you have a mole that is larger than the   size of a pencil eraser.  Always use sunscreen. Apply sunscreen liberally and repeatedly throughout the day.  Protect yourself by wearing long sleeves, pants, a wide-brimmed hat, and sunglasses whenever you are outside. Heart disease, diabetes, and high blood pressure  High blood pressure causes heart disease and increases the risk of stroke. High blood pressure is more likely to develop in: ? People who have blood pressure in the high end of the normal range (130-139/85-89 mm Hg). ? People who are overweight or obese. ? People who are African American.  If you are 18-39 years of age, have your blood pressure checked every 3-5 years. If you are 40 years of age or older, have your blood pressure checked every year. You should have your blood pressure measured twice-once when you are at a hospital or clinic, and once when you are not at a hospital or clinic. Record the average of the two measurements. To check your  blood pressure when you are not at a hospital or clinic, you can use: ? An automated blood pressure machine at a pharmacy. ? A home blood pressure monitor.  If you are between 55 years and 79 years old, ask your health care provider if you should take aspirin to prevent strokes.  Have regular diabetes screenings. This involves taking a blood sample to check your fasting blood sugar level. ? If you are at a normal weight and have a low risk for diabetes, have this test once every three years after 69 years of age. ? If you are overweight and have a high risk for diabetes, consider being tested at a younger age or more often. Preventing infection Hepatitis B  If you have a higher risk for hepatitis B, you should be screened for this virus. You are considered at high risk for hepatitis B if: ? You were born in a country where hepatitis B is common. Ask your health care provider which countries are considered high risk. ? Your parents were born in a high-risk country, and you have not been immunized against hepatitis B (hepatitis B vaccine). ? You have HIV or AIDS. ? You use needles to inject street drugs. ? You live with someone who has hepatitis B. ? You have had sex with someone who has hepatitis B. ? You get hemodialysis treatment. ? You take certain medicines for conditions, including cancer, organ transplantation, and autoimmune conditions. Hepatitis C  Blood testing is recommended for: ? Everyone born from 1945 through 1965. ? Anyone with known risk factors for hepatitis C. Sexually transmitted infections (STIs)  You should be screened for sexually transmitted infections (STIs) including gonorrhea and chlamydia if: ? You are sexually active and are younger than 69 years of age. ? You are older than 69 years of age and your health care provider tells you that you are at risk for this type of infection. ? Your sexual activity has changed since you were last screened and you are at an  increased risk for chlamydia or gonorrhea. Ask your health care provider if you are at risk.  If you do not have HIV, but are at risk, it may be recommended that you take a prescription medicine daily to prevent HIV infection. This is called pre-exposure prophylaxis (PrEP). You are considered at risk if: ? You are sexually active and do not regularly use condoms or know the HIV status of your partner(s). ? You take drugs by injection. ? You are sexually active with a partner who has HIV.   Talk with your health care provider about whether you are at high risk of being infected with HIV. If you choose to begin PrEP, you should first be tested for HIV. You should then be tested every 3 months for as long as you are taking PrEP. Pregnancy  If you are premenopausal and you may become pregnant, ask your health care provider about preconception counseling.  If you may become pregnant, take 400 to 800 micrograms (mcg) of folic acid every day.  If you want to prevent pregnancy, talk to your health care provider about birth control (contraception). Osteoporosis and menopause  Osteoporosis is a disease in which the bones lose minerals and strength with aging. This can result in serious bone fractures. Your risk for osteoporosis can be identified using a bone density scan.  If you are 52 years of age or older, or if you are at risk for osteoporosis and fractures, ask your health care provider if you should be screened.  Ask your health care provider whether you should take a calcium or vitamin D supplement to lower your risk for osteoporosis.  Menopause may have certain physical symptoms and risks.  Hormone replacement therapy may reduce some of these symptoms and risks. Talk to your health care provider about whether hormone replacement therapy is right for you. Follow these instructions at home:  Schedule regular health, dental, and eye exams.  Stay current with your immunizations.  Do not use  any tobacco products including cigarettes, chewing tobacco, or electronic cigarettes.  If you are pregnant, do not drink alcohol.  If you are breastfeeding, limit how much and how often you drink alcohol.  Limit alcohol intake to no more than 1 drink per day for nonpregnant women. One drink equals 12 ounces of beer, 5 ounces of wine, or 1 ounces of hard liquor.  Do not use street drugs.  Do not share needles.  Ask your health care provider for help if you need support or information about quitting drugs.  Tell your health care provider if you often feel depressed.  Tell your health care provider if you have ever been abused or do not feel safe at home. This information is not intended to replace advice given to you by your health care provider. Make sure you discuss any questions you have with your health care provider. Document Released: 05/21/2011 Document Revised: 04/12/2016 Document Reviewed: 08/09/2015 Elsevier Interactive Patient Education  2019 [employer].

## 2018-12-12 ENCOUNTER — Encounter: Payer: Self-pay | Admitting: Internal Medicine

## 2018-12-12 ENCOUNTER — Other Ambulatory Visit: Payer: Self-pay

## 2018-12-12 ENCOUNTER — Ambulatory Visit (INDEPENDENT_AMBULATORY_CARE_PROVIDER_SITE_OTHER): Payer: PPO | Admitting: Internal Medicine

## 2018-12-12 ENCOUNTER — Encounter (HOSPITAL_COMMUNITY)
Admission: RE | Admit: 2018-12-12 | Discharge: 2018-12-12 | Disposition: A | Discharge status: Home / Self Care | Payer: PPO | Source: Ambulatory Visit | Attending: Urology | Admitting: Urology

## 2018-12-12 ENCOUNTER — Other Ambulatory Visit (INDEPENDENT_AMBULATORY_CARE_PROVIDER_SITE_OTHER): Payer: PPO

## 2018-12-12 ENCOUNTER — Encounter (HOSPITAL_COMMUNITY): Payer: Self-pay

## 2018-12-12 VITALS — BP 118/82 | HR 73 | Temp 98.4°F | Resp 16 | Ht 67.0 in | Wt 167.8 lb

## 2018-12-12 DIAGNOSIS — Z01812 Encounter for preprocedural laboratory examination: Secondary | ICD-10-CM | POA: Diagnosis not present

## 2018-12-12 DIAGNOSIS — N8110 Cystocele, unspecified: Secondary | ICD-10-CM | POA: Diagnosis not present

## 2018-12-12 DIAGNOSIS — M797 Fibromyalgia: Secondary | ICD-10-CM

## 2018-12-12 DIAGNOSIS — Z0000 Encounter for general adult medical examination without abnormal findings: Secondary | ICD-10-CM

## 2018-12-12 DIAGNOSIS — M810 Age-related osteoporosis without current pathological fracture: Secondary | ICD-10-CM | POA: Diagnosis not present

## 2018-12-12 DIAGNOSIS — E782 Mixed hyperlipidemia: Secondary | ICD-10-CM

## 2018-12-12 DIAGNOSIS — R7303 Prediabetes: Secondary | ICD-10-CM

## 2018-12-12 DIAGNOSIS — E042 Nontoxic multinodular goiter: Secondary | ICD-10-CM

## 2018-12-12 HISTORY — DX: Cardiac arrhythmia, unspecified: I49.9

## 2018-12-12 HISTORY — DX: Atherosclerotic heart disease of native coronary artery without angina pectoris: I25.10

## 2018-12-12 LAB — CBC WITH DIFFERENTIAL/PLATELET
Basophils Absolute: 0 10*3/uL (ref 0.0–0.1)
Basophils Relative: 0.6 % (ref 0.0–3.0)
Eosinophils Absolute: 0.1 10*3/uL (ref 0.0–0.7)
Eosinophils Relative: 1.8 % (ref 0.0–5.0)
HCT: 39.1 % (ref 36.0–46.0)
Hemoglobin: 12.8 g/dL (ref 12.0–15.0)
Lymphocytes Relative: 30.1 % (ref 12.0–46.0)
Lymphs Abs: 1.9 10*3/uL (ref 0.7–4.0)
MCHC: 32.8 g/dL (ref 30.0–36.0)
MCV: 86 fl (ref 78.0–100.0)
Monocytes Absolute: 0.4 10*3/uL (ref 0.1–1.0)
Monocytes Relative: 6.9 % (ref 3.0–12.0)
Neutro Abs: 3.8 10*3/uL (ref 1.4–7.7)
Neutrophils Relative %: 60.6 % (ref 43.0–77.0)
Platelets: 257 10*3/uL (ref 150.0–400.0)
RBC: 4.55 Mil/uL (ref 3.87–5.11)
RDW: 14.5 % (ref 11.5–15.5)
WBC: 6.2 10*3/uL (ref 4.0–10.5)

## 2018-12-12 LAB — LIPID PANEL
Cholesterol: 259 mg/dL — ABNORMAL HIGH (ref 0–200)
HDL: 67.5 mg/dL (ref >39.00)
LDL Cholesterol: 161 mg/dL — ABNORMAL HIGH (ref 0–99)
NonHDL: 191.87
Total CHOL/HDL Ratio: 4
Triglycerides: 154 mg/dL — ABNORMAL HIGH (ref 0.0–149.0)
VLDL: 30.8 mg/dL (ref 0.0–40.0)

## 2018-12-12 LAB — TSH: TSH: 0.98 u[IU]/mL (ref 0.35–4.50)

## 2018-12-12 LAB — SURGICAL PCR SCREEN
MRSA, PCR: NEGATIVE
Staphylococcus aureus: POSITIVE — AB

## 2018-12-12 LAB — COMPREHENSIVE METABOLIC PANEL
ALT: 21 U/L (ref 0–35)
AST: 20 U/L (ref 0–37)
Albumin: 4.4 g/dL (ref 3.5–5.2)
Alkaline Phosphatase: 50 U/L (ref 39–117)
BUN: 23 mg/dL (ref 6–23)
CO2: 28 mEq/L (ref 19–32)
Calcium: 10.2 mg/dL (ref 8.4–10.5)
Chloride: 105 mEq/L (ref 96–112)
Creatinine, Ser: 0.91 mg/dL (ref 0.40–1.20)
GFR: 61.36 mL/min (ref >60.00)
Glucose, Bld: 87 mg/dL (ref 70–99)
Potassium: 4 mEq/L (ref 3.5–5.1)
Sodium: 140 mEq/L (ref 135–145)
Total Bilirubin: 0.5 mg/dL (ref 0.2–1.2)
Total Protein: 7.5 g/dL (ref 6.0–8.3)

## 2018-12-12 LAB — ABO/RH: ABO/RH(D): A POS

## 2018-12-12 LAB — HEMOGLOBIN A1C: Hgb A1c MFr Bld: 5.6 % (ref 4.6–6.5)

## 2018-12-12 NOTE — Assessment & Plan Note (Signed)
Chronic pain Unchanged Increase exercise

## 2018-12-12 NOTE — Assessment & Plan Note (Signed)
dexa done 2019 - gyn to start prolia Increase walking. Continue calcium and vitamin d

## 2018-12-12 NOTE — Assessment & Plan Note (Signed)
[place] ordered for July to recheck thyroid

## 2018-12-12 NOTE — Assessment & Plan Note (Signed)
Does not want any medication - did not do well with lipitor Cmp, tsh, lipid Increase exercise, healthy

## 2018-12-12 NOTE — Assessment & Plan Note (Signed)
Check a1c Low sugar / carb diet Stressed regular exercise   

## 2018-12-13 ENCOUNTER — Encounter: Payer: Self-pay | Admitting: Internal Medicine

## 2018-12-13 DIAGNOSIS — E782 Mixed hyperlipidemia: Secondary | ICD-10-CM

## 2018-12-13 LAB — URINE CULTURE: Culture: NO GROWTH
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
## 2018-12-17 ENCOUNTER — Ambulatory Visit (HOSPITAL_COMMUNITY): Payer: PPO | Admitting: Certified Registered"

## 2018-12-17 ENCOUNTER — Encounter (HOSPITAL_COMMUNITY)
Admission: RE | Disposition: A | Discharge status: Home / Self Care | Payer: Self-pay | Source: Ambulatory Visit | Attending: Urology

## 2018-12-17 ENCOUNTER — Other Ambulatory Visit: Payer: Self-pay

## 2018-12-17 ENCOUNTER — Ambulatory Visit (HOSPITAL_COMMUNITY): Payer: PPO | Admitting: Physician Assistant

## 2018-12-17 ENCOUNTER — Encounter (HOSPITAL_COMMUNITY): Payer: Self-pay | Admitting: *Deleted

## 2018-12-17 ENCOUNTER — Observation Stay (HOSPITAL_COMMUNITY)
Admission: RE | Admit: 2018-12-17 | Discharge: 2018-12-18 | Disposition: A | Discharge status: Home / Self Care | Payer: PPO | Source: Ambulatory Visit | Attending: Urology | Admitting: Urology

## 2018-12-17 DIAGNOSIS — E785 Hyperlipidemia, unspecified: Secondary | ICD-10-CM | POA: Diagnosis not present

## 2018-12-17 DIAGNOSIS — Z91012 Allergy to eggs: Secondary | ICD-10-CM | POA: Diagnosis not present

## 2018-12-17 DIAGNOSIS — N815 Vaginal enterocele: Principal | ICD-10-CM | POA: Insufficient documentation

## 2018-12-17 DIAGNOSIS — I2510 Atherosclerotic heart disease of native coronary artery without angina pectoris: Secondary | ICD-10-CM | POA: Diagnosis not present

## 2018-12-17 DIAGNOSIS — J4520 Mild intermittent asthma, uncomplicated: Secondary | ICD-10-CM | POA: Insufficient documentation

## 2018-12-17 DIAGNOSIS — Z880 Allergy status to penicillin: Secondary | ICD-10-CM | POA: Diagnosis not present

## 2018-12-17 DIAGNOSIS — Z9102 Food additives allergy status: Secondary | ICD-10-CM | POA: Diagnosis not present

## 2018-12-17 DIAGNOSIS — Z91040 Latex allergy status: Secondary | ICD-10-CM | POA: Insufficient documentation

## 2018-12-17 DIAGNOSIS — Z91010 Allergy to peanuts: Secondary | ICD-10-CM | POA: Insufficient documentation

## 2018-12-17 DIAGNOSIS — Z91018 Allergy to other foods: Secondary | ICD-10-CM | POA: Insufficient documentation

## 2018-12-17 DIAGNOSIS — Z888 Allergy status to other drugs, medicaments and biological substances status: Secondary | ICD-10-CM | POA: Insufficient documentation

## 2018-12-17 DIAGNOSIS — Z87820 Personal history of traumatic brain injury: Secondary | ICD-10-CM | POA: Diagnosis not present

## 2018-12-17 DIAGNOSIS — Z79899 Other long term (current) drug therapy: Secondary | ICD-10-CM | POA: Insufficient documentation

## 2018-12-17 DIAGNOSIS — M797 Fibromyalgia: Secondary | ICD-10-CM | POA: Insufficient documentation

## 2018-12-17 DIAGNOSIS — N8189 Other female genital prolapse: Secondary | ICD-10-CM | POA: Diagnosis not present

## 2018-12-17 DIAGNOSIS — Z885 Allergy status to narcotic agent status: Secondary | ICD-10-CM | POA: Insufficient documentation

## 2018-12-17 DIAGNOSIS — Z87440 Personal history of urinary (tract) infections: Secondary | ICD-10-CM | POA: Insufficient documentation

## 2018-12-17 DIAGNOSIS — R7303 Prediabetes: Secondary | ICD-10-CM | POA: Insufficient documentation

## 2018-12-17 DIAGNOSIS — N819 Female genital prolapse, unspecified: Secondary | ICD-10-CM | POA: Diagnosis not present

## 2018-12-17 DIAGNOSIS — Z7951 Long term (current) use of inhaled steroids: Secondary | ICD-10-CM | POA: Insufficient documentation

## 2018-12-17 DIAGNOSIS — K5900 Constipation, unspecified: Secondary | ICD-10-CM | POA: Diagnosis not present

## 2018-12-17 DIAGNOSIS — N8110 Cystocele, unspecified: Secondary | ICD-10-CM | POA: Diagnosis present

## 2018-12-17 HISTORY — PX: ROBOTIC ASSISTED LAPAROSCOPIC SACROCOLPOPEXY: SHX5388

## 2018-12-17 LAB — TYPE AND SCREEN
ABO/RH(D): A POS
Antibody Screen: NEGATIVE

## 2018-12-17 SURGERY — SACROCOLPOPEXY, ROBOT-ASSISTED, LAPAROSCOPIC
Anesthesia: General

## 2018-12-17 MED ORDER — GLYCOPYRROLATE PF 0.2 MG/ML IJ SOSY
PREFILLED_SYRINGE | INTRAMUSCULAR | Status: AC
  Filled 2018-12-17: qty 2

## 2018-12-17 MED ORDER — METOPROLOL TARTRATE 12.5 MG HALF TABLET
12.5000 mg | ORAL_TABLET | Freq: Two times a day (BID) | ORAL | Status: DC
  Administered 2018-12-17 – 2018-12-18 (×3): 12.5 mg via ORAL
  Filled 2018-12-17 (×3): qty 1

## 2018-12-17 MED ORDER — LIDOCAINE 2% (20 MG/ML) 5 ML SYRINGE
INTRAMUSCULAR | Status: AC
  Filled 2018-12-17: qty 5

## 2018-12-17 MED ORDER — ONDANSETRON HCL 4 MG/2ML IJ SOLN
INTRAMUSCULAR | Status: AC
  Filled 2018-12-17: qty 2

## 2018-12-17 MED ORDER — BSS IO SOLN
15.0000 mL | Freq: Once | INTRAOCULAR | Status: AC
  Administered 2018-12-17: 15 mL
  Filled 2018-12-17: qty 15

## 2018-12-17 MED ORDER — FENTANYL CITRATE (PF) 100 MCG/2ML IJ SOLN
25.0000 ug | INTRAMUSCULAR | Status: DC | PRN
  Administered 2018-12-17: 50 ug via INTRAVENOUS

## 2018-12-17 MED ORDER — PHENYLEPHRINE HCL 10 MG/ML IJ SOLN
INTRAMUSCULAR | Status: DC | PRN
  Administered 2018-12-17 (×7): 60 ug via INTRAVENOUS

## 2018-12-17 MED ORDER — ROCURONIUM BROMIDE 100 MG/10ML IV SOLN
INTRAVENOUS | Status: DC | PRN
  Administered 2018-12-17 (×2): 10 mg via INTRAVENOUS
  Administered 2018-12-17: 60 mg via INTRAVENOUS
  Administered 2018-12-17 (×2): 5 mg via INTRAVENOUS
  Administered 2018-12-17: 10 mg via INTRAVENOUS

## 2018-12-17 MED ORDER — LACTATED RINGERS IV SOLN
INTRAVENOUS | Status: DC
  Administered 2018-12-17 (×2): via INTRAVENOUS

## 2018-12-17 MED ORDER — ACETAMINOPHEN 325 MG PO TABS
650.0000 mg | ORAL_TABLET | Freq: Once | ORAL | Status: AC
  Administered 2018-12-17: 650 mg via ORAL

## 2018-12-17 MED ORDER — FENTANYL CITRATE (PF) 100 MCG/2ML IJ SOLN
25.0000 ug | INTRAMUSCULAR | Status: DC | PRN
  Administered 2018-12-18: 25 ug via INTRAVENOUS
  Filled 2018-12-17: qty 2

## 2018-12-17 MED ORDER — FLUTICASONE PROPIONATE 50 MCG/ACT NA SUSP
1.0000 | Freq: Every day | NASAL | Status: DC
  Filled 2018-12-17: qty 16

## 2018-12-17 MED ORDER — SUGAMMADEX SODIUM 200 MG/2ML IV SOLN
INTRAVENOUS | Status: DC | PRN
  Administered 2018-12-17: 200 mg via INTRAVENOUS

## 2018-12-17 MED ORDER — DEXAMETHASONE SODIUM PHOSPHATE 10 MG/ML IJ SOLN
INTRAMUSCULAR | Status: DC | PRN
  Administered 2018-12-17: 10 mg via INTRAVENOUS

## 2018-12-17 MED ORDER — GLYCOPYRROLATE 0.2 MG/ML IJ SOLN
INTRAMUSCULAR | Status: DC | PRN
  Administered 2018-12-17: .1 mg via INTRAVENOUS

## 2018-12-17 MED ORDER — SUCCINYLCHOLINE CHLORIDE 200 MG/10ML IV SOSY
PREFILLED_SYRINGE | INTRAVENOUS | Status: AC
  Filled 2018-12-17: qty 10

## 2018-12-17 MED ORDER — ONDANSETRON HCL 4 MG/2ML IJ SOLN: 4.0000 mg | Freq: Once | INTRAMUSCULAR | Status: DC | PRN

## 2018-12-17 MED ORDER — SODIUM CHLORIDE 0.9 % IV SOLN
INTRAVENOUS | Status: DC | PRN
  Administered 2018-12-17: 20 ug/min via INTRAVENOUS
  Administered 2018-12-17: 10 ug/min via INTRAVENOUS

## 2018-12-17 MED ORDER — FENTANYL CITRATE (PF) 100 MCG/2ML IJ SOLN
INTRAMUSCULAR | Status: AC
  Filled 2018-12-17: qty 2

## 2018-12-17 MED ORDER — BUDESONIDE 0.25 MG/2ML IN SUSP
0.2500 mg | Freq: Two times a day (BID) | RESPIRATORY_TRACT | Status: DC
  Administered 2018-12-17 – 2018-12-18 (×2): 0.25 mg via RESPIRATORY_TRACT
  Filled 2018-12-17 (×2): qty 2

## 2018-12-17 MED ORDER — CLINDAMYCIN PHOSPHATE 900 MG/50ML IV SOLN
INTRAVENOUS | Status: AC
  Filled 2018-12-17: qty 50

## 2018-12-17 MED ORDER — PROPOFOL 10 MG/ML IV BOLUS
INTRAVENOUS | Status: AC
  Filled 2018-12-17: qty 40

## 2018-12-17 MED ORDER — ROCURONIUM BROMIDE 100 MG/10ML IV SOLN
INTRAVENOUS | Status: AC
  Filled 2018-12-17: qty 1

## 2018-12-17 MED ORDER — ESTRADIOL 0.1 MG/GM VA CREA
TOPICAL_CREAM | VAGINAL | Status: DC | PRN
  Administered 2018-12-17: 1 via VAGINAL

## 2018-12-17 MED ORDER — LACTATED RINGERS IV SOLN
INTRAVENOUS | Status: DC | PRN
  Administered 2018-12-17: 08:00:00 via INTRAVENOUS

## 2018-12-17 MED ORDER — PHENYLEPHRINE HCL 10 MG/ML IJ SOLN
INTRAMUSCULAR | Status: AC
Start: 2018-12-17 — End: ?
  Filled 2018-12-17: qty 1

## 2018-12-17 MED ORDER — DEXTROSE-NACL 5-0.45 % IV SOLN
INTRAVENOUS | Status: DC
  Administered 2018-12-17 – 2018-12-18 (×2): via INTRAVENOUS

## 2018-12-17 MED ORDER — SODIUM CHLORIDE (PF) 0.9 % IJ SOLN
INTRAMUSCULAR | Status: DC | PRN
  Administered 2018-12-17: 20 mL

## 2018-12-17 MED ORDER — SUGAMMADEX SODIUM 200 MG/2ML IV SOLN
INTRAVENOUS | Status: AC
  Filled 2018-12-17: qty 2

## 2018-12-17 MED ORDER — LACTATED RINGERS IR SOLN
Status: DC | PRN
  Administered 2018-12-17: 1000 mL

## 2018-12-17 MED ORDER — EPHEDRINE SULFATE 50 MG/ML IJ SOLN
INTRAMUSCULAR | Status: DC | PRN
  Administered 2018-12-17 (×3): 5 mg via INTRAVENOUS

## 2018-12-17 MED ORDER — DOCUSATE SODIUM 100 MG PO CAPS
100.0000 mg | ORAL_CAPSULE | Freq: Every day | ORAL | Status: DC
  Administered 2018-12-17: 100 mg via ORAL
  Filled 2018-12-17 (×2): qty 1

## 2018-12-17 MED ORDER — PHENYLEPHRINE 40 MCG/ML (10ML) SYRINGE FOR IV PUSH (FOR BLOOD PRESSURE SUPPORT)
PREFILLED_SYRINGE | INTRAVENOUS | Status: AC
  Filled 2018-12-17: qty 10

## 2018-12-17 MED ORDER — PROPOFOL 10 MG/ML IV BOLUS
INTRAVENOUS | Status: DC | PRN
  Administered 2018-12-17: 150 mg via INTRAVENOUS

## 2018-12-17 MED ORDER — STERILE WATER FOR IRRIGATION IR SOLN
Status: DC | PRN
  Administered 2018-12-17: 1000 mL

## 2018-12-17 MED ORDER — CLINDAMYCIN PHOSPHATE 900 MG/50ML IV SOLN
INTRAVENOUS | Status: DC | PRN
  Administered 2018-12-17: 900 mg via INTRAVENOUS

## 2018-12-17 MED ORDER — ONDANSETRON HCL 4 MG/2ML IJ SOLN
4.0000 mg | Freq: Four times a day (QID) | INTRAMUSCULAR | Status: DC | PRN
  Administered 2018-12-17 – 2018-12-18 (×3): 4 mg via INTRAVENOUS
  Filled 2018-12-17 (×4): qty 2

## 2018-12-17 MED ORDER — KETOROLAC TROMETHAMINE 0.5 % OP SOLN
1.0000 [drp] | Freq: Three times a day (TID) | OPHTHALMIC | Status: DC | PRN
  Administered 2018-12-17: 1 [drp] via OPHTHALMIC
  Filled 2018-12-17: qty 5

## 2018-12-17 MED ORDER — FENTANYL CITRATE (PF) 100 MCG/2ML IJ SOLN
INTRAMUSCULAR | Status: DC | PRN
  Administered 2018-12-17: 25 ug via INTRAVENOUS
  Administered 2018-12-17 (×2): 50 ug via INTRAVENOUS
  Administered 2018-12-17: 100 ug via INTRAVENOUS
  Administered 2018-12-17: 25 ug via INTRAVENOUS

## 2018-12-17 MED ORDER — LIDOCAINE HCL (CARDIAC) PF 100 MG/5ML IV SOSY
PREFILLED_SYRINGE | INTRAVENOUS | Status: DC | PRN
  Administered 2018-12-17: 70 mg via INTRAVENOUS

## 2018-12-17 MED ORDER — BUPIVACAINE LIPOSOME 1.3 % IJ SUSP
20.0000 mL | Freq: Once | INTRAMUSCULAR | Status: AC
  Administered 2018-12-17: 20 mL
  Filled 2018-12-17: qty 20

## 2018-12-17 MED ORDER — ONDANSETRON HCL 4 MG/2ML IJ SOLN
INTRAMUSCULAR | Status: DC | PRN
  Administered 2018-12-17: 4 mg via INTRAVENOUS

## 2018-12-17 MED ORDER — SALINE SPRAY 0.65 % NA SOLN: 1.0000 | Freq: Four times a day (QID) | NASAL | Status: DC | PRN

## 2018-12-17 MED ORDER — ALBUTEROL SULFATE (2.5 MG/3ML) 0.083% IN NEBU: 2.5000 mg | INHALATION_SOLUTION | RESPIRATORY_TRACT | Status: DC | PRN

## 2018-12-17 MED ORDER — LORATADINE 10 MG PO TABS
10.0000 mg | ORAL_TABLET | Freq: Every day | ORAL | Status: DC
  Filled 2018-12-17: qty 1

## 2018-12-17 MED ORDER — KETOTIFEN FUMARATE 0.025 % OP SOLN
1.0000 [drp] | Freq: Two times a day (BID) | OPHTHALMIC | Status: DC | PRN
  Administered 2018-12-17: 1 [drp] via OPHTHALMIC
  Filled 2018-12-17: qty 5

## 2018-12-17 MED ORDER — ACETAMINOPHEN 10 MG/ML IV SOLN
1000.0000 mg | Freq: Four times a day (QID) | INTRAVENOUS | Status: DC
  Administered 2018-12-17 – 2018-12-18 (×3): 1000 mg via INTRAVENOUS
  Filled 2018-12-17 (×4): qty 100

## 2018-12-17 MED ORDER — SODIUM CHLORIDE (PF) 0.9 % IJ SOLN
INTRAMUSCULAR | Status: AC
  Filled 2018-12-17: qty 20

## 2018-12-17 MED ORDER — EPHEDRINE 5 MG/ML INJ
INTRAVENOUS | Status: AC
  Filled 2018-12-17: qty 10

## 2018-12-17 MED ORDER — PROPOFOL 500 MG/50ML IV EMUL
INTRAVENOUS | Status: DC | PRN
  Administered 2018-12-17: 25 ug/kg/min via INTRAVENOUS

## 2018-12-17 MED ORDER — ESTRADIOL 0.1 MG/GM VA CREA
TOPICAL_CREAM | VAGINAL | Status: AC
  Filled 2018-12-17: qty 42.5

## 2018-12-17 MED ORDER — FENTANYL CITRATE (PF) 250 MCG/5ML IJ SOLN
INTRAMUSCULAR | Status: AC
  Filled 2018-12-17: qty 5

## 2018-12-17 MED ORDER — DEXAMETHASONE SODIUM PHOSPHATE 10 MG/ML IJ SOLN
INTRAMUSCULAR | Status: AC
  Filled 2018-12-17: qty 1

## 2018-12-17 MED ORDER — IPRATROPIUM BROMIDE 0.03 % NA SOLN: 1.0000 | Freq: Four times a day (QID) | NASAL | Status: DC | PRN

## 2018-12-17 SURGICAL SUPPLY — 66 items
CHLORAPREP W/TINT 26ML (MISCELLANEOUS) ×2 IMPLANT
CLIP VESOLOCK LG 6/CT PURPLE (CLIP) ×2 IMPLANT
CLIP VESOLOCK MED LG 6/CT (CLIP) ×1 IMPLANT
COVER SURGICAL LIGHT HANDLE (MISCELLANEOUS) ×2 IMPLANT
COVER TIP SHEARS 8 DVNC (MISCELLANEOUS) ×1 IMPLANT
COVER TIP SHEARS 8MM DA VINCI (MISCELLANEOUS) ×1
COVER WAND RF STERILE (DRAPES) IMPLANT
DERMABOND ADVANCED (GAUZE/BANDAGES/DRESSINGS) ×1
DERMABOND ADVANCED .7 DNX12 (GAUZE/BANDAGES/DRESSINGS) ×1 IMPLANT
DRAPE ARM DVNC X/XI (DISPOSABLE) ×4 IMPLANT
DRAPE COLUMN DVNC XI (DISPOSABLE) ×1 IMPLANT
DRAPE DA VINCI XI ARM (DISPOSABLE) ×4
DRAPE DA VINCI XI COLUMN (DISPOSABLE) ×1
DRAPE INCISE IOBAN 66X45 STRL (DRAPES) ×2 IMPLANT
DRAPE SHEET LG 3/4 BI-LAMINATE (DRAPES) ×4 IMPLANT
DRAPE SURG IRRIG POUCH 19X23 (DRAPES) ×2 IMPLANT
ELECT PENCIL ROCKER SW 15FT (MISCELLANEOUS) ×2 IMPLANT
ELECT REM PT RETURN 15FT ADLT (MISCELLANEOUS) ×2 IMPLANT
GAUZE XEROFORM 1X8 LF (GAUZE/BANDAGES/DRESSINGS) ×1 IMPLANT
GLOVE BIOGEL PI IND STRL 7.0 (GLOVE) IMPLANT
GLOVE BIOGEL PI IND STRL 8 (GLOVE) IMPLANT
GLOVE BIOGEL PI INDICATOR 7.0 (GLOVE) ×2
GLOVE BIOGEL PI INDICATOR 8 (GLOVE) ×1
GLOVE SURG SS PI 6.5 STRL IVOR (GLOVE) ×1 IMPLANT
GLOVE SURG SS PI 7.5 STRL IVOR (GLOVE) ×3 IMPLANT
GOWN STRL REUS W/ TWL XL LVL3 (GOWN DISPOSABLE) ×2 IMPLANT
GOWN STRL REUS W/TWL LRG LVL3 (GOWN DISPOSABLE) ×2 IMPLANT
GOWN STRL REUS W/TWL XL LVL3 (GOWN DISPOSABLE) ×2
HOLDER FOLEY CATH W/STRAP (MISCELLANEOUS) ×2 IMPLANT
IRRIG SUCT STRYKERFLOW 2 WTIP (MISCELLANEOUS) ×2
IRRIGATION SUCT STRKRFLW 2 WTP (MISCELLANEOUS) ×1 IMPLANT
KIT BASIN OR (CUSTOM PROCEDURE TRAY) ×2 IMPLANT
MANIPULATOR UTERINE 4.5 ZUMI (MISCELLANEOUS) IMPLANT
MARKER SKIN DUAL TIP RULER LAB (MISCELLANEOUS) ×2 IMPLANT
MESH Y UPSYLON VAGINAL (Mesh General) ×1 IMPLANT
OCCLUDER COLPOPNEUMO (BALLOONS) IMPLANT
PACK GENERAL/GYN (CUSTOM PROCEDURE TRAY) ×1 IMPLANT
PACKING VAGINAL (PACKING) ×2 IMPLANT
PAD POSITIONING PINK XL (MISCELLANEOUS) ×2 IMPLANT
PLUG CATH AND CAP STER (CATHETERS) ×2 IMPLANT
PORT ACCESS TROCAR AIRSEAL 12 (TROCAR) ×1 IMPLANT
PORT ACCESS TROCAR AIRSEAL 5M (TROCAR) ×1
POUCH SPECIMEN RETRIEVAL 10MM (ENDOMECHANICALS) IMPLANT
SEAL CANN UNIV 5-8 DVNC XI (MISCELLANEOUS) ×4 IMPLANT
SEAL XI 5MM-8MM UNIVERSAL (MISCELLANEOUS) ×4
SET IRRIG Y TYPE TUR BLADDER L (SET/KITS/TRAYS/PACK) ×2 IMPLANT
SET TRI-LUMEN FLTR TB AIRSEAL (TUBING) ×2 IMPLANT
SHEET LAVH (DRAPES) ×2 IMPLANT
SOLUTION ELECTROLUBE (MISCELLANEOUS) ×2 IMPLANT
SUT MNCRL AB 4-0 PS2 18 (SUTURE) ×4 IMPLANT
SUT PROLENE 2 0 CT 1 (SUTURE) ×3 IMPLANT
SUT VIC AB 0 CT1 27 (SUTURE) ×1
SUT VIC AB 0 CT1 27XBRD ANTBC (SUTURE) ×1 IMPLANT
SUT VIC AB 2-0 SH 27 (SUTURE) ×6
SUT VIC AB 2-0 SH 27X BRD (SUTURE) ×5 IMPLANT
SUT VIC AB 2-0 SH 27XBRD (SUTURE) IMPLANT
SUT VIC AB 3-0 SH 27 (SUTURE) ×5
SUT VIC AB 3-0 SH 27X BRD (SUTURE) IMPLANT
SUT VICRYL 0 UR6 27IN ABS (SUTURE) ×2 IMPLANT
SYR 50ML LL SCALE MARK (SYRINGE) IMPLANT
SYR BULB IRRIGATION 50ML (SYRINGE) IMPLANT
TOWEL OR 17X26 10 PK STRL BLUE (TOWEL DISPOSABLE) ×3 IMPLANT
TOWEL OR NON WOVEN STRL DISP B (DISPOSABLE) ×1 IMPLANT
TRAY FOLEY BAG SILVER LF 16FR (CATHETERS) ×1 IMPLANT
TRAY LAPAROSCOPIC (CUSTOM PROCEDURE TRAY) ×1 IMPLANT
TROCAR XCEL 12X100 BLDLESS (ENDOMECHANICALS) IMPLANT

## 2018-12-17 NOTE — Anesthesia Postprocedure Evaluation (Signed)
Anesthesia Post Note  Patient: Kara Ramirez  Procedure(s) Performed: XI ROBOTIC ASSISTED LAPAROSCOPIC SACROCOLPOPEXY (N/A )     Patient location during evaluation: PACU Anesthesia Type: General Level of consciousness: awake Pain management: pain level controlled Vital Signs Assessment: post-procedure vital signs reviewed and stable Respiratory status: spontaneous breathing, nonlabored ventilation, respiratory function stable and patient connected to nasal cannula oxygen Cardiovascular status: blood pressure returned to baseline and stable Postop Assessment: no apparent nausea or vomiting Anesthetic complications: no    Last Vitals:  Vitals:   12/17/18 1415 12/17/18 1430  BP: 111/67 110/70  Pulse: 63 64  Resp: 14 16  Temp:    SpO2: 100% 100%    Last Pain:  Vitals:   12/17/18 1430  TempSrc:   PainSc: 0-No pain                 Audry Pili
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
## 2018-12-17 NOTE — Op Note (Signed)
Preoperative diagnosis:  1. Pelvic organ prolapse   Postoperative diagnosis:  1. same   Procedure: 1. Robotic assisted laparoscopic sacrocolpopexy  Surgeon: Ardis Hughs, MD 1st assistant: Case Clydene Laming, MD  Anesthesia: General  Complications: None  Intraoperative findings: [place] Y-Mesh placed, cut to specific vaginal length  EBL: 100cc  Specimens: None  Indication: Kara Ramirez is a 68 y.o. female patient with symptomatic pelvic organ prolapse.  After reviewing the management options for treatment, he elected to proceed with the above surgical procedure(s). We have discussed the potential benefits and risks of the procedure, side effects of the proposed treatment, the likelihood of the patient achieving the goals of the procedure, and any potential problems that might occur during the procedure or recuperation. Informed consent has been obtained.  Description of procedure:  A Foley catheter was then placed and placed to gravity drainage. I then made a periumbilical incision carrying the dissection down to the patient's fascia with electrocautery. Once to the fascia, the fascia was incised the peritoneum opened. A 71mm port was then placed into the abdomen. The abdomen was insufflated and the remaining ports placed under digital guidance. 2 ports were placed lateral to the umbilicus on the right proximally 10 cm apart. The most lateral port was approximately 3 cm above the anterior iliac spine. 2 additional ports were placed in the patient's right side in comparable positions to the most lateral port on the right was a 12 mm port.the robot was then docked at an angle from the leg obliquely along the side of the left leg.  We then began our surgery by cleaning up some of the pelvic adhesions to the small bowel and colon. Once this was completed I started dissecting at the sacral promontory located 3 cm medial to the location where the ureter crosses over the  iliac vessels at the pelvic brim. The posterior peritoneum was incised and the sacral prominence cleared off an area taking care to avoid the middle sacral vessels and the iliac branches. I then created a posterior peritoneal tunnel starting at the sacral promontory and tunneling down the right pelvic sidewall down into the pelvis breaking back through the posterior peritoneum around the vesico-vaginal junction posteriorly. I then continued the posterior dissection retracting down on the rectum and finding the avascular plane between the posterior vaginal wall and the rectum. I carried this dissection down as far as I could to along the area of the perineal body.  I then turned my attention to the anterior plane between the anterior vaginal wall and the bladder. I was able to obtain access to the avascular plane and with a combination of both monopolar cautery and blunt dissection was able to clean and nice down to the bladder neck. I then turned my attention back to the patient's uterus and skeletonized the right uterine artery and vein and then took this with a series of bipolar moves. I then performed a similar uterus pedicle ligation on the left.   Mesh was measured at approximately 7.5cm anteriorly and 7.5 cm posteriorly and I cut this on the back table. The mesh was then placed into the patient's abdomen through the assistant port and the anterior leaf was secured down onto the anterior vaginal wall with the apex at the bladder neck. The posterior leaf was then secured down on the posterior vaginal wall. These were sewn down with 2-0 Vicryl. Between 6 and 8 were done on each side. At this point I then went  back to the previously dissected sacral promontory and posterior peritoneal tunnel and inserted a instrument through the tunnel and grasped the end of the mesh at the vaginal cuff and pull it up to the sacrum. I then checked to ensure that the sacral mesh was not too tight by performing a  vaginal exam. I then secured the sacral leg of the mesh using a 0 Prolene. I then reapproximated the posterior peritoneum with a 2-0 Vicryl in a running fashion around the sacral promontory. The pelvic peritoneum was closed using a pursestring. The fascia of the 12 mm port was then closed with 0 Vicryl in a figure-of-eight fashion. The skin was closed with 4-0 Monocryl's. Dermabond was applied to the incision and exparel injected into the incisions.  The patient was subsequently extubated and returned to the PACU in excellent condition.   Ardis Hughs, M.D.

## 2018-12-17 NOTE — Transfer of Care (Signed)
Immediate Anesthesia Transfer of Care Note  Patient: Kara Ramirez  Procedure(s) Performed: XI ROBOTIC ASSISTED LAPAROSCOPIC SACROCOLPOPEXY (N/A )  Patient Location: PACU  Anesthesia Type:General  Level of Consciousness: awake, alert , oriented and patient cooperative  Airway & Oxygen Therapy: Patient Spontanous Breathing and Patient connected to nasal cannula oxygen  Post-op Assessment: Report given to RN and Post -op Vital signs reviewed and stable  Post vital signs: Reviewed and stable  Last Vitals:  Vitals Value Taken Time  BP 107/63 12/17/2018  1:15 PM  Temp    Pulse 75 12/17/2018  1:19 PM  Resp 15 12/17/2018  1:19 PM  SpO2 98 % 12/17/2018  1:19 PM  Vitals shown include unvalidated device data.  Last Pain:  Vitals:   12/17/18 1315  TempSrc:   PainSc: 0-No pain      Patients Stated Pain Goal: 4 (96/43/83 8184)  Complications: No apparent anesthesia complications

## 2018-12-17 NOTE — Anesthesia Procedure Notes (Signed)
Procedure Name: Intubation Date/Time: 12/17/2018 7:49 AM Performed by: Adalberto Ill, CRNA Pre-anesthesia Checklist: Patient identified, Emergency Drugs available, Suction available, Patient being monitored and Timeout performed Patient Re-evaluated:Patient Re-evaluated prior to induction Oxygen Delivery Method: Circle system utilized Preoxygenation: Pre-oxygenation with 100% oxygen Induction Type: IV induction Ventilation: Mask ventilation without difficulty Laryngoscope Size: Miller and 2 Grade View: Grade I Tube type: Oral Tube size: 7.5 mm Number of attempts: 1 (brief atraumatic ) Airway Equipment and Method: Stylet Placement Confirmation: ETT inserted through vocal cords under direct vision,  positive ETCO2 and breath sounds checked- equal and bilateral Secured at: 23 cm Tube secured with: Tape Dental Injury: Teeth and Oropharynx as per pre-operative assessment

## 2018-12-18 DIAGNOSIS — N815 Vaginal enterocele: Secondary | ICD-10-CM | POA: Diagnosis not present

## 2018-12-18 LAB — BASIC METABOLIC PANEL
Anion gap: 8 (ref 5–15)
BUN: 14 mg/dL (ref 8–23)
CO2: 22 mmol/L (ref 22–32)
Calcium: 8.8 mg/dL — ABNORMAL LOW (ref 8.9–10.3)
Chloride: 106 mmol/L (ref 98–111)
Creatinine, Ser: 0.74 mg/dL (ref 0.44–1.00)
GFR calc Af Amer: >60 mL/min (ref >60)
GFR calc non Af Amer: >60 mL/min (ref >60)
Glucose, Bld: 149 mg/dL — ABNORMAL HIGH (ref 70–99)
Potassium: 3.9 mmol/L (ref 3.5–5.1)
Sodium: 136 mmol/L (ref 135–145)

## 2018-12-18 LAB — HIV ANTIBODY (ROUTINE TESTING W REFLEX): HIV Screen 4th Generation wRfx: NONREACTIVE

## 2018-12-18 LAB — HEMOGLOBIN AND HEMATOCRIT, BLOOD
HCT: 34.1 % — ABNORMAL LOW (ref 36.0–46.0)
Hemoglobin: 10.7 g/dL — ABNORMAL LOW (ref 12.0–15.0)

## 2018-12-18 MED ORDER — TRAMADOL HCL 50 MG PO TABS: 50.0000 mg | ORAL_TABLET | Freq: Four times a day (QID) | ORAL | 0 refills | Status: DC | PRN

## 2018-12-18 MED ORDER — ACETAMINOPHEN 500 MG PO TABS: 1000.0000 mg | ORAL_TABLET | Freq: Once | ORAL | Status: DC

## 2018-12-18 MED ORDER — SCOPOLAMINE 1 MG/3DAYS TD PT72
1.0000 | MEDICATED_PATCH | TRANSDERMAL | Status: DC
  Administered 2018-12-18: 1.5 mg via TRANSDERMAL
  Filled 2018-12-18: qty 1

## 2018-12-18 MED ORDER — ESTRADIOL 0.1 MG/GM VA CREA
1.0000 | TOPICAL_CREAM | Freq: Every day | VAGINAL | Status: DC
  Filled 2018-12-18: qty 42.5

## 2018-12-18 MED ORDER — CLINDAMYCIN HCL 300 MG PO CAPS: 300.0000 mg | ORAL_CAPSULE | Freq: Three times a day (TID) | ORAL | 0 refills | Status: AC

## 2018-12-18 MED ORDER — ESTRADIOL 0.1 MG/GM VA CREA: TOPICAL_CREAM | VAGINAL | 0 refills | Status: DC

## 2018-12-18 NOTE — Discharge Summary (Signed)
Date of admission: 12/17/2018  Date of discharge: 12/18/2018  Admission diagnosis: pelvic organ prolapse  Discharge diagnosis: same  Secondary diagnoses:  Patient Active Problem List   Diagnosis Date Noted  . Vaginal prolapse 12/17/2018  . Jerking 09/22/2018  . Closed head injury 09/11/2018  . Mild intermittent asthma without complication 76/16/0737  . Multinodular thyroid, follow up US in 05/2019 06/10/2018  . Fibromyalgia 05/26/2018  . 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 to 5 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 ([place]) 03/25/2018  . Coccygeal pain 03/25/2018  . Difficulty with speech 03/24/2018  . Poor balance 03/24/2018  . Hip pain 03/24/2018  . SAH (subarachnoid hemorrhage) ([place]) 03/06/2018  . Chest tightness 02/04/2018  . Left lower lobe pulmonary nodule 12/10/2017  . Paroxysmal atrial fibrillation ([place]) 10/30/2017  . Shortness of breath 08/30/2017  . Chronic sinusitis 03/14/2017  . Severe scoliosis 12/11/2016  . Prediabetes 06/06/2016  . Cough 06/06/2016  . Allergic rhinitis 02/08/2016  . Osteoporosis 12/05/2015  . Hyperlipidemia 05/27/2015  . 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 Internal hemorrhoids 08/23/2011  . Constipation 08/23/2011    Procedures performed: Procedure(s): XI ROBOTIC ASSISTED LAPAROSCOPIC SACROCOLPOPEXY  History and Physical: For full details, please see admission history and physical. Briefly, Kara Ramirez is a 68 y.o. year old patient with history of complete pelvic organ prolapse.   Hospital Course: Patient tolerated the procedure well.  She was then transferred to the floor after an uneventful PACU stay.  Her hospital course was uncomplicated.  On POD#1 she had met discharge criteria: was eating a regular diet, was up and ambulating independently,  pain was well controlled, was voiding without a catheter, and was ready to for discharge.   Laboratory values:  Recent Labs    12/18/18 0421  HGB 10.7*  HCT 34.1*   Recent Labs     12/18/18 0421  NA 136  K 3.9  CL 106  CO2 22  GLUCOSE 149*  BUN 14  CREATININE 0.74  CALCIUM 8.8*   No results for input(s): LABPT, INR in the last 72 hours. No results for input(s): LABURIN in the last 72 hours. Results for orders placed or performed during the hospital encounter of 12/12/18  Surgical pcr screen     Status: Abnormal   Collection Time: 12/12/18 11:35 AM  Result Value Ref Range Status   MRSA, PCR NEGATIVE NEGATIVE Final   Staphylococcus aureus POSITIVE (A) NEGATIVE Final    Comment: (NOTE) The Xpert SA Assay (FDA approved for NASAL specimens in patients 8 years of age and older), is one component of a comprehensive surveillance program. It is not intended to diagnose infection nor to guide or monitor treatment. Performed at [facility], [place] [address], [place], [place] [address]   Urine culture     Status: None   Collection Time: 12/12/18 11:39 AM  Result Value Ref Range Status   Specimen Description URINE, CLEAN CATCH  Final   Special Requests   Final    NONE Performed at [facility] [address] [place], [place] [address]    Culture NO GROWTH  Final   Report Status 12/13/2018 FINAL  Final    Disposition: Home  Discharge instruction: The patient was instructed to be ambulatory but told to refrain from heavy lifting, strenuous activity, or driving.   Discharge medications:  Allergies as of 12/18/2018      Reactions   Mold Extract [trichophyton Mentagrophyte] Shortness Of Breath, Rash   Penicillins Shortness  Of Breath, Rash, Other (See Comments)   Eyes puffy Has taken low dose pcn and no rx REACTION: rash, SOB Has patient had a PCN reaction causing immediate rash, facial/tongue/throat swelling, SOB or lightheadedness with hypotension: yes Has patient had a PCN reaction causing severe rash involving mucus membranes or skin necrosis: unk Has patient had a PCN reaction that required hospitalization: no Has  patient had a PCN reaction occurring within the last 10 years: unk If all of the above answers are "NO", then may proceed with Cephalospor   Wheat Shortness Of Breath, Other (See Comments)   Tightness in chest   Wheat Bran Anaphylaxis   Morphine Other (See Comments)   REACTION: tachycardia and anxiety   Peanut Oil Nausea And Vomiting   Peanut butter   Protonix [pantoprazole Sodium] Nausea And Vomiting   Citrus Other (See Comments)   Unknown   Peanut-containing Drug Products Other (See Comments)   Unknown   Tramadol Other (See Comments)   Makes crazy;confused   Valium [diazepam] Other (See Comments)   Confusion per family   Cetirizine Rash, Other (See Comments)   Around face   Codeine Other (See Comments)   REACTION: dizzy and "groggy in my head"   Eggs Or Egg-derived Products Nausea And Vomiting   Latex Itching   Pentazocine Lactate Palpitations      Medication List    TAKE these medications   acetaminophen 325 MG tablet Commonly known as:  TYLENOL Take 1-2 tablets (325-650 mg total) by mouth every 4 (four) hours as needed for mild pain. What changed:    how much to take  reasons to take this   albuterol 108 (90 Base) MCG/ACT inhaler Commonly known as:  PROAIR HFA INHALE 2 PUFFS INTO THE LUNGS EVERY 4 HOURS AS NEEDED FOR COUGH OR WHEEZE. MAY USE 2 PUFFS 10 TO 20 MINUTES PRIOR TO EXERCISE What changed:    how much to take  how to take this  when to take this  reasons to take this  additional instructions   AMBULATORY NON FORMULARY MEDICATION Place 1 application rectally 3 (three) times daily as needed (for fissures). Diltiazem 2% gel with Lidocaine 5% Apply a pea sized amount internally three times daily. Dispense 30 GM zero refill   aspirin-acetaminophen-caffeine 250-250-65 MG tablet Commonly known as:  EXCEDRIN MIGRAINE Take 1 tablet by mouth every 6 (six) hours as needed for headache.   CALCIUM 600+D 600-400 MG-UNIT tablet Generic drug:  Calcium  Carbonate-Vitamin D Take 1 tablet by mouth daily.   cholecalciferol 1000 units tablet Commonly known as:  VITAMIN D Take 1,000 Units by mouth daily.   clindamycin 300 MG capsule Commonly known as:  CLEOCIN Take 1 capsule (300 mg total) by mouth 3 (three) times daily for 5 days.   Co-Enzyme Q-10 100 MG Caps Take 100 mg by mouth daily.   docusate sodium 100 MG capsule Commonly known as:  COLACE Take 1 capsule (100 mg total) by mouth 2 (two) times daily. Is available over the counter. What changed:    when to take this  additional instructions   estradiol 0.1 MG/GM vaginal cream Commonly known as:  ESTRACE VAGINAL Apply a small dolyp of cream to index finger and smear around just inside vaginal opening.   Fish Oil 1000 MG Caps Take 1,000 mg by mouth at bedtime.   fluocinonide ointment 0.05 % Commonly known as:  LIDEX Apply 1 application topically See admin instructions. Apply topically twice daily for 5 days alternating with  Tacrolimus ointment   fluticasone 110 MCG/ACT inhaler Commonly known as:  FLOVENT HFA Inhale 2 puffs into the lungs 2 (two) times daily. What changed:  when to take this   fluticasone 50 MCG/ACT nasal spray Commonly known as:  FLONASE USE 1 SPRAY IN EACH NOSTRIL MID-DAY FOR CONGESTION What changed:  See the new instructions.   ipratropium 0.03 % nasal spray Commonly known as:  ATROVENT Place 1 spray into both nostrils every 6 (six) hours as needed for rhinitis.   ketotifen 0.025 % ophthalmic solution Commonly known as:  ZADITOR Place 1 drop into both eyes 2 (two) times daily as needed (for dry eyes).   loratadine 10 MG tablet Commonly known as:  CLARITIN Take 10 mg by mouth daily.   MALIC ACID PO Take 1 capsule by mouth at bedtime.   metoprolol tartrate 25 MG tablet Commonly known as:  LOPRESSOR Take 0.5 tablets (12.5 mg total) by mouth 2 (two) times daily.   naproxen sodium 220 MG tablet Commonly known as:  ALEVE Take 220 mg by mouth  at bedtime.   REFRESH OPTIVE 1-0.9 % Gel Generic drug:  Carboxymethylcellul-Glycerin Place 1 application into both eyes at bedtime.   sodium chloride 0.65 % Soln nasal spray Commonly known as:  OCEAN Place 1 spray into both nostrils as needed for congestion. What changed:  when to take this   tacrolimus 0.1 % ointment Commonly known as:  PROTOPIC Apply 1 application topically 2 (two) times daily as needed (PRN skin issues). What changed:    when to take this  additional instructions   thiamine 100 MG tablet Commonly known as:  VITAMIN B-1 Take 100 mg by mouth daily.   traMADol 50 MG tablet Commonly known as:  ULTRAM Take 1-2 tablets (50-100 mg total) by mouth every 6 (six) hours as needed for moderate pain.       Followup:  Follow-up Information    Ardis Hughs, MD On 01/01/2019.   Specialty:  Urology Why:  1:30 Contact information: [place] [address] [phone]

## 2018-12-18 NOTE — Discharge Instructions (Signed)
·   Activity:  You are encouraged to ambulate frequently (about every hour during waking hours) to help prevent blood clots from forming in your legs or lungs.  However, you should not engage in any heavy lifting (> 10-15 lbs), strenuous activity, or straining.   Diet: You should advance your diet as instructed by your physician.  It will be normal to have some bloating, nausea, and abdominal discomfort intermittently.   Prescriptions:  You will be provided a prescription for pain medication to take as needed.  If your pain is not severe enough to require the prescription pain medication, you may take extra strength Tylenol instead which will have less side effects.  You should also take a prescribed stool softener to avoid straining with bowel movements as the prescription pain medication may constipate you.   Incisions: You may remove your dressing bandages 48 hours after surgery if not removed in the hospital.  You will either have some small staples or special tissue glue at each of the incision sites. Once the bandages are removed (if present), the incisions may stay open to air.  You may start showering (but not soaking or bathing in water) the 2nd day after surgery and the incisions simply need to be patted dry after the shower.  No additional care is needed.   No soaking in a bath tube for 10 days.   Do not stick anything in your vagina for 1 month.   What to call us about: You should call the office [phone]) if you develop fever > 101 or develop persistent vomiting. Activity:  You are encouraged to ambulate frequently (about every hour during waking hours) to help prevent blood clots from forming in your legs or lungs.  However, you should not engage in any heavy lifting (> 10-15 lbs), strenuous activity, or straining.

## 2018-12-19 ENCOUNTER — Telehealth: Payer: Self-pay | Admitting: *Deleted

## 2018-12-19 NOTE — Telephone Encounter (Signed)
Pt was on TCM report admitted 12/17/28 for evaluation of her pelvic prolapse.  The patient has the symptomatic cystocele/enterocele. She has lost apical support of her vagina creating the symptoms. Fortunately she does not have any evidence of stress urinary incontinence or bladder dysfunction. Pt underwent a Robotic assisted laparoscopic sacrocolpopexy tolerated procedure well. Pt D/C 12/18/18, and will follow-up w/Herrick, Viona Gilmore, MD (Urology) on 01/01/2019.../lmb  1.

## 2018-12-23 ENCOUNTER — Encounter (HOSPITAL_COMMUNITY): Payer: Self-pay | Admitting: Urology

## 2018-12-23 NOTE — Telephone Encounter (Signed)
Copied from [place] [id number]. Topic: Quick Communication - See Telephone Encounter >> Dec 23, 2018 12:27 PM Vernona Rieger wrote: CRM for notification. See Telephone encounter for: 12/23/18.  Patient would like Dr Quay Burow to go ahead and call in her cholesterol medication. She uses [facility] [address]

## 2018-12-24 DIAGNOSIS — M810 Age-related osteoporosis without current pathological fracture: Secondary | ICD-10-CM | POA: Diagnosis not present

## 2018-12-24 MED ORDER — PRAVASTATIN SODIUM 10 MG PO TABS: 10.0000 mg | ORAL_TABLET | ORAL | 3 refills | Status: DC

## 2018-12-24 NOTE — Telephone Encounter (Signed)
Pt aware of response below.  

## 2018-12-24 NOTE — Telephone Encounter (Signed)
Medication sent - it is a low dose - take only three times a week to see how she does with it.  Lets repeat lipid panel and liver tests in 6-8 weeks (ordered)

## 2019-01-01 DIAGNOSIS — N8110 Cystocele, unspecified: Secondary | ICD-10-CM | POA: Diagnosis not present

## 2019-01-07 DIAGNOSIS — M5136 Other intervertebral disc degeneration, lumbar region: Secondary | ICD-10-CM | POA: Diagnosis not present

## 2019-01-16 DIAGNOSIS — S069X9A Unspecified intracranial injury with loss of consciousness of unspecified duration, initial encounter: Secondary | ICD-10-CM | POA: Diagnosis not present

## 2019-01-16 DIAGNOSIS — H532 Diplopia: Secondary | ICD-10-CM | POA: Diagnosis not present

## 2019-01-16 DIAGNOSIS — H01009 Unspecified blepharitis unspecified eye, unspecified eyelid: Secondary | ICD-10-CM | POA: Diagnosis not present

## 2019-01-16 DIAGNOSIS — H4912 Fourth [trochlear] nerve palsy, left eye: Secondary | ICD-10-CM | POA: Diagnosis not present

## 2019-01-23 DIAGNOSIS — R350 Frequency of micturition: Secondary | ICD-10-CM | POA: Diagnosis not present

## 2019-01-23 DIAGNOSIS — R3915 Urgency of urination: Secondary | ICD-10-CM | POA: Diagnosis not present
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
## 2019-01-27 ENCOUNTER — Ambulatory Visit: Payer: PPO | Attending: Neurosurgery | Admitting: Speech Pathology

## 2019-01-27 ENCOUNTER — Other Ambulatory Visit: Payer: Self-pay

## 2019-01-27 DIAGNOSIS — R4701 Aphasia: Secondary | ICD-10-CM

## 2019-01-27 NOTE — Patient Instructions (Addendum)
  Aphasia - difficulty finding words - can affect comprehension of language, reading comprehension and written expression  Activities and games that can help you: Family Feud; Outburst, Scattergories;   Look up word finding app games -   Energy conservation - if you have something at night, rest during the day to help preserve your word finding  Name items in categories: actors, Christmas items, flowers, etc   Try to explain that you are forgetting words, not forgetting life things
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
## 2019-02-04 ENCOUNTER — Telehealth: Payer: Self-pay | Admitting: Allergy & Immunology

## 2019-02-04 MED ORDER — FLUTICASONE PROPIONATE HFA 110 MCG/ACT IN AERO: 2.0000 | INHALATION_SPRAY | Freq: Two times a day (BID) | RESPIRATORY_TRACT | 1 refills | Status: DC

## 2019-02-04 MED ORDER — BENZONATATE 100 MG PO CAPS: 100.0000 mg | ORAL_CAPSULE | Freq: Three times a day (TID) | ORAL | 0 refills | Status: DC | PRN

## 2019-02-04 NOTE — Telephone Encounter (Signed)
Dr Nelva Bush please advise if we can refill Flovent and Tessalon perles she was last seen June 2019

## 2019-02-04 NOTE — Telephone Encounter (Signed)
Yes this is fine to refill these medications and will have her make an appt for summer visit (1 year).

## 2019-02-04 NOTE — Telephone Encounter (Signed)
Talk to pt and she needs to have Flovent, Benbezonatate pearls, called into [facility] [address]. [phone].

## 2019-02-05 ENCOUNTER — Ambulatory Visit: Payer: PPO | Admitting: Allergy & Immunology

## 2019-02-10 ENCOUNTER — Telehealth: Payer: Self-pay | Admitting: Speech Pathology

## 2019-02-10 ENCOUNTER — Ambulatory Visit: Payer: PPO | Admitting: Speech Pathology

## 2019-02-10 NOTE — Telephone Encounter (Signed)
  [facility] was contacted today regarding the temporary closing of [facility] due to Covid-19.  Therapist discussed:  Home activities she can do for speech/language. Plan to mail her some more homework  Patient is interested in further information for an e-visit, virtual check in, or telehealth visit, if those services become available.    [facility] will follow up with patients when we are able to resume care.  Georgiann Hahn, [place], [facility] [address] [place], [place]  [address] Phone:  [phone] Fax:  [phone] \

## 2019-02-12 ENCOUNTER — Ambulatory Visit: Payer: PPO | Admitting: Speech Pathology

## 2019-02-17 ENCOUNTER — Encounter: Payer: PPO | Admitting: Speech Pathology

## 2019-02-19 ENCOUNTER — Encounter: Payer: PPO | Admitting: Speech Pathology

## 2019-02-20 ENCOUNTER — Telehealth: Payer: Self-pay | Admitting: Physical Therapy

## 2019-02-20 DIAGNOSIS — S72442S Displaced fracture of lower epiphysis (separation) of left femur, sequela: Secondary | ICD-10-CM | POA: Diagnosis not present

## 2019-02-20 DIAGNOSIS — M6281 Muscle weakness (generalized): Secondary | ICD-10-CM | POA: Diagnosis not present

## 2019-02-20 DIAGNOSIS — R350 Frequency of micturition: Secondary | ICD-10-CM | POA: Diagnosis not present

## 2019-02-20 DIAGNOSIS — R3915 Urgency of urination: Secondary | ICD-10-CM | POA: Diagnosis not present

## 2019-02-20 NOTE — Telephone Encounter (Signed)
Kara Ramirez was contacted today regarding temporary reduction of [facility]patient [facility] Services due to concerns for community transmission of COVID-19.  Patient identity was verified.  Assessed if patient needed to be seen in person by clinician (recent fall or acute injury that requires hands on assessment and advice, change in diet order, post-surgical, special cases, etc.).    Patient did not have an acute/special need that requires in person visit. Proceeded with phone call.  Therapist advised the patient to continue to perform his/her HEP and assured he/she had no unanswered questions or concerns at this time.  The patient expressed interest in being contacted for an E-Visit, virtual check in, or Telehealth visit to continue their plan of care, when those services become available. Email to use is [email or web address].  [facility] will follow up with patient at that time.   Patient is aware we can be reached by telephone during limited business hours in the meantime.

## 2019-03-03 ENCOUNTER — Encounter: Payer: PPO | Admitting: Speech Pathology

## 2019-03-05 ENCOUNTER — Encounter: Payer: PPO | Admitting: Speech Pathology

## 2019-03-24 NOTE — Progress Notes (Signed)
PT PRESCREENED FOR APPT ON 5-6

## 2019-03-25 ENCOUNTER — Other Ambulatory Visit: Payer: Self-pay

## 2019-03-25 ENCOUNTER — Ambulatory Visit (INDEPENDENT_AMBULATORY_CARE_PROVIDER_SITE_OTHER): Payer: PPO | Admitting: Gastroenterology

## 2019-03-25 ENCOUNTER — Encounter: Payer: Self-pay | Admitting: Gastroenterology

## 2019-03-25 VITALS — Ht 66.0 in | Wt 165.0 lb

## 2019-03-25 DIAGNOSIS — K5900 Constipation, unspecified: Secondary | ICD-10-CM

## 2019-03-25 DIAGNOSIS — R194 Change in bowel habit: Secondary | ICD-10-CM | POA: Diagnosis not present

## 2019-03-25 MED ORDER — POLYETHYLENE GLYCOL 3350 17 G PO PACK: 17.0000 g | PACK | Freq: Every day | ORAL | 0 refills | Status: DC | PRN
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
## 2019-03-25 NOTE — Patient Instructions (Signed)
If you are age 69 or older, your body mass index should be between 23-30. Your Body mass index is 26.63 kg/m. If this is out of the aforementioned range listed, please consider follow up with your Primary Care Provider.  If you are age 20 or younger, your body mass index should be between 19-25. Your Body mass index is 26.63 kg/m. If this is out of the aformentioned range listed, please consider follow up with your Primary Care Provider.   Please increase Miralax as needed.  We have referred you for Pelvic Floor Physical Therapy.  They will contact you to schedule an appointment. Please let us know if you have not heard from them within a week or two. Thank you.  Thank you for entrusting me with your care and for choosing [facility], Dr. [place] Cellar

## 2019-03-26 ENCOUNTER — Ambulatory Visit: Payer: PPO | Admitting: Physical Therapy

## 2019-03-30 DIAGNOSIS — N3281 Overactive bladder: Secondary | ICD-10-CM | POA: Diagnosis not present

## 2019-03-31 ENCOUNTER — Ambulatory Visit (INDEPENDENT_AMBULATORY_CARE_PROVIDER_SITE_OTHER): Payer: PPO | Admitting: Allergy & Immunology

## 2019-03-31 ENCOUNTER — Encounter: Payer: Self-pay | Admitting: Allergy & Immunology

## 2019-03-31 ENCOUNTER — Ambulatory Visit: Payer: PPO | Admitting: Neurology

## 2019-03-31 ENCOUNTER — Other Ambulatory Visit: Payer: Self-pay

## 2019-03-31 VITALS — BP 120/84 | HR 94 | Resp 16

## 2019-03-31 DIAGNOSIS — J4530 Mild persistent asthma, uncomplicated: Secondary | ICD-10-CM | POA: Diagnosis not present

## 2019-03-31 DIAGNOSIS — J302 Other seasonal allergic rhinitis: Secondary | ICD-10-CM

## 2019-03-31 DIAGNOSIS — J3089 Other allergic rhinitis: Secondary | ICD-10-CM

## 2019-03-31 MED ORDER — BUDESONIDE-FORMOTEROL FUMARATE 80-4.5 MCG/ACT IN AERO: 2.0000 | INHALATION_SPRAY | Freq: Two times a day (BID) | RESPIRATORY_TRACT | 5 refills | Status: DC

## 2019-03-31 MED ORDER — ALBUTEROL SULFATE HFA 108 (90 BASE) MCG/ACT IN AERS: INHALATION_SPRAY | RESPIRATORY_TRACT | 0 refills | Status: DC

## 2019-03-31 MED ORDER — LEVOCETIRIZINE DIHYDROCHLORIDE 5 MG PO TABS: 5.0000 mg | ORAL_TABLET | Freq: Every evening | ORAL | 5 refills | Status: DC
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
## 2019-03-31 NOTE — Patient Instructions (Addendum)
1. Mild persistent asthma, uncomplicated - Stop the Flovent and go back to the Symbicort 80/4.14mcg two puffs twice daily. - Call us in two weeks with an update. - Spacer use reviewed. - Daily controller medication(s): Symbicort 80/4.53mcg two puffs twice daily with spacer - Prior to physical activity: albuterol 2 puffs 10-15 minutes before physical activity. - Rescue medications: albuterol 4 puffs every 4-6 hours as needed - Asthma control goals:  * Full participation in all desired activities (may need albuterol before activity) * Albuterol use two time or less a week on average (not counting use with activity) * Cough interfering with sleep two time or less a month * Oral steroids no more than once a year * No hospitalizations  2. Seasonal and perennial allergic rhinitis - Continue with the Flonase two sprays per nostril daily. - Continue with nasal ipratropium one spray per nostril every 8 hours.  - Stop the Claritin and start Xyzal.  - Add on Ayr nasal saline gel to use as needed for dry nose to help with the bleeding.   3. Return in about 3 months (around 07/01/2019). This can be an in-person, a virtual Webex or a telephone follow up visit.  I would recommend talking to your Cardiovascular Surgeon about the lung biopsy results.    Please inform us of any Emergency Department visits, hospitalizations, or changes in symptoms. Call us before going to the ED for breathing or allergy symptoms since we might be able to fit you in for a sick visit. Feel free to contact us anytime with any questions, problems, or concerns.  It was a pleasure to see you again today!  Websites that have reliable patient information: 1. American Academy of Asthma, Allergy, and Immunology: www.aaaai.org 2. Food Allergy Research and Education (FARE): foodallergy.org 3. Mothers of Asthmatics: http://www.asthmacommunitynetwork.org 4. American College of Allergy, Asthma, and Immunology: www.acaai.org  "Like" [place]  on Facebook and Instagram for our latest updates!      Make sure you are registered to vote! If you have moved or changed any of your contact information, you will need to get this updated before voting!    Voter ID laws are NOT going into effect for the General Election in November 2020! DO NOT let this stop you from exercising your right to vote!

## 2019-04-06 ENCOUNTER — Other Ambulatory Visit: Payer: Self-pay

## 2019-04-06 ENCOUNTER — Ambulatory Visit: Payer: PPO | Attending: Gastroenterology | Admitting: Physical Therapy

## 2019-04-06 ENCOUNTER — Encounter: Payer: Self-pay | Admitting: Physical Therapy

## 2019-04-06 DIAGNOSIS — M6281 Muscle weakness (generalized): Secondary | ICD-10-CM | POA: Insufficient documentation

## 2019-04-06 DIAGNOSIS — R293 Abnormal posture: Secondary | ICD-10-CM | POA: Insufficient documentation

## 2019-04-06 DIAGNOSIS — R279 Unspecified lack of coordination: Secondary | ICD-10-CM | POA: Insufficient documentation
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
## 2019-04-06 NOTE — Patient Instructions (Signed)
Access Code: [email or web address]  URL: [email or web address]  Date: 04/06/2019  Prepared by: Jari Favre   Exercises  Supine Pelvic Floor Stretch - 3 reps - 1 sets - 30 sec hold - 1x daily - 7x weekly  Supine Diaphragmatic Breathing with Pelvic Floor Lengthening - 10 reps - 1 sets - 3x daily - 7x weekly

## 2019-04-07 ENCOUNTER — Ambulatory Visit: Payer: PPO | Attending: Neurosurgery | Admitting: Speech Pathology

## 2019-04-07 ENCOUNTER — Other Ambulatory Visit: Payer: Self-pay

## 2019-04-07 ENCOUNTER — Encounter: Payer: Self-pay | Admitting: Speech Pathology

## 2019-04-07 DIAGNOSIS — R41841 Cognitive communication deficit: Secondary | ICD-10-CM | POA: Diagnosis not present

## 2019-04-07 DIAGNOSIS — L821 Other seborrheic keratosis: Secondary | ICD-10-CM | POA: Diagnosis not present

## 2019-04-07 DIAGNOSIS — R4701 Aphasia: Secondary | ICD-10-CM | POA: Diagnosis not present

## 2019-04-07 DIAGNOSIS — D2262 Melanocytic nevi of left upper limb, including shoulder: Secondary | ICD-10-CM | POA: Diagnosis not present

## 2019-04-07 DIAGNOSIS — L900 Lichen sclerosus et atrophicus: Secondary | ICD-10-CM | POA: Diagnosis not present

## 2019-04-07 DIAGNOSIS — D1801 Hemangioma of skin and subcutaneous tissue: Secondary | ICD-10-CM | POA: Diagnosis not present
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
## 2019-04-10 ENCOUNTER — Other Ambulatory Visit: Payer: Self-pay

## 2019-04-10 ENCOUNTER — Ambulatory Visit: Payer: PPO | Admitting: Physical Therapy

## 2019-04-10 DIAGNOSIS — R279 Unspecified lack of coordination: Secondary | ICD-10-CM | POA: Diagnosis not present

## 2019-04-10 DIAGNOSIS — M6281 Muscle weakness (generalized): Secondary | ICD-10-CM

## 2019-04-10 DIAGNOSIS — R293 Abnormal posture: Secondary | ICD-10-CM

## 2019-04-10 NOTE — Patient Instructions (Signed)
Toileting Techniques for Bowel Movements (Defecation) Using your belly (abdomen) and pelvic floor muscles to have a bowel movement is usually instinctive.  Sometimes people can have problems with these muscles and have to relearn proper defecation (emptying) techniques.  If you have weakness in your muscles, organs that are falling out, decreased sensation in your pelvis, or ignore your urge to go, you may find yourself straining to have a bowel movement.  You are straining if you are: . holding your breath or taking in a huge gulp of air and holding it  . keeping your lips and jaw tensed and closed tightly . turning red in the face because of excessive pushing or forcing . developing or worsening your  hemorrhoids . getting faint while pushing . not emptying completely and have to defecate many times a day  If you are straining, you are actually making it harder for yourself to have a bowel movement.  Many people find they are pulling up with the pelvic floor muscles and closing off instead of opening the anus. Due to lack pelvic floor relaxation and coordination the abdominal muscles, one has to work harder to push the feces out.  Many people have never been taught how to defecate efficiently and effectively.  Notice what happens to your body when you are having a bowel movement.  While you are sitting on the toilet pay attention to the following areas: . Jaw and mouth position . Angle of your hips   . Whether your feet touch the ground or not . Arm placement  . Spine position . Waist . Belly tension . Anus (opening of the anal canal)  An Evacuation/Defecation Plan   Here are the 4 basic points:  1. Lean forward enough for your elbows to rest on your knees 2. Support your feet on the floor or use a low stool if your feet don't touch the floor  3. Push out your belly as if you have swallowed a beach ball-you should feel a widening of your waist 4. Open and relax your pelvic floor muscles,  rather than tightening around the anus      The following conditions my require modifications to your toileting posture:  . If you have had surgery in the past that limits your back, hip, pelvic, knee or ankle flexibility . Constipation   Your healthcare practitioner may make the following additional suggestions and adjustments:  1) Sit on the toilet  a) Make sure your feet are supported. b) Notice your hip angle and spine position-most people find it effective to lean forward or raise their knees, which can help the muscles around the anus to relax  c) When you lean forward, place your forearms on your thighs for support  2) Relax suggestions a) Breath deeply in through your nose and out slowly through your mouth as if you are smelling the flowers and blowing out the candles. b) To become aware of how to relax your muscles, contracting and releasing muscles can be helpful.  Pull your pelvic floor muscles in tightly by using the image of holding back gas, or closing around the anus (visualize making a circle smaller) and lifting the anus up and in.  Then release the muscles and your anus should drop down and feel open. Repeat 5 times ending with the feeling of relaxation. c) Keep your pelvic floor muscles relaxed; let your belly bulge out. d) The digestive tract starts at the mouth and ends at the anal opening, so be   sure to relax both ends of the tube.  Place your tongue on the roof of your mouth with your teeth separated.  This helps relax your mouth and will help to relax the anus at the same time.  3) Empty (defecation) a) Keep your pelvic floor and sphincter relaxed, then bulge your anal muscles.  Make the anal opening wide.  b) Stick your belly out as if you have swallowed a beach ball. c) Make your belly wall hard using your belly muscles while continuing to breathe. Doing this makes it easier to open your anus. d) Breath out and give a grunt (or try using other sounds such as  ahhhh, shhhhh, ohhhh or grrrrrrr).  4) Finish a) As you finish your bowel movement, pull the pelvic floor muscles up and in.  This will leave your anus in the proper place rather than remaining pushed out and down. If you leave your anus pushed out and down, it will start to feel as though that is normal and give you incorrect signals about needing to have a bowel movement.   [facility] [address] [place], [place] [address]  
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
## 2019-04-14 ENCOUNTER — Other Ambulatory Visit: Payer: Self-pay

## 2019-04-14 ENCOUNTER — Ambulatory Visit: Payer: PPO | Admitting: Speech Pathology

## 2019-04-14 ENCOUNTER — Encounter: Payer: Self-pay | Admitting: Speech Pathology

## 2019-04-14 DIAGNOSIS — R4701 Aphasia: Secondary | ICD-10-CM | POA: Diagnosis not present

## 2019-04-14 DIAGNOSIS — R41841 Cognitive communication deficit: Secondary | ICD-10-CM
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
## 2019-04-15 ENCOUNTER — Ambulatory Visit: Payer: PPO | Admitting: Physical Therapy

## 2019-04-15 ENCOUNTER — Other Ambulatory Visit: Payer: Self-pay

## 2019-04-15 DIAGNOSIS — R279 Unspecified lack of coordination: Secondary | ICD-10-CM

## 2019-04-15 DIAGNOSIS — R293 Abnormal posture: Secondary | ICD-10-CM

## 2019-04-15 DIAGNOSIS — M6281 Muscle weakness (generalized): Secondary | ICD-10-CM

## 2019-04-15 NOTE — Patient Instructions (Signed)
Access Code: [email or web address]  URL: [email or web address]  Date: 04/15/2019  Prepared by: Jari Favre   Exercises  Supine Pelvic Floor Stretch - 3 reps - 1 sets - 30 sec hold - 1x daily - 7x weekly  Supine Diaphragmatic Breathing with Pelvic Floor Lengthening - 10 reps - 1 sets - 3x daily - 7x weekly  Seated Figure 4 Piriformis Stretch - 5 reps - 1 sets - 20 seconds hold - 1x daily - 7x weekly
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
## 2019-04-17 ENCOUNTER — Ambulatory Visit: Payer: PPO | Admitting: Physical Therapy

## 2019-04-17 ENCOUNTER — Other Ambulatory Visit: Payer: Self-pay

## 2019-04-17 DIAGNOSIS — M6281 Muscle weakness (generalized): Secondary | ICD-10-CM

## 2019-04-17 DIAGNOSIS — R279 Unspecified lack of coordination: Secondary | ICD-10-CM

## 2019-04-17 DIAGNOSIS — R293 Abnormal posture: Secondary | ICD-10-CM
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
## 2019-04-17 NOTE — Patient Instructions (Signed)
Access Code: [email or web address]  URL: [email or web address]  Date: 04/17/2019  Prepared by: Jari Favre   Exercises  Supine Pelvic Floor Stretch - 3 reps - 1 sets - 30 sec hold - 1x daily - 7x weekly  Supine Diaphragmatic Breathing with Pelvic Floor Lengthening - 10 reps - 1 sets - 3x daily - 7x weekly  Seated Figure 4 Piriformis Stretch - 5 reps - 1 sets - 20 seconds hold - 1x daily - 7x weekly  Seated Pelvic Floor Contraction - 10 reps - 3 sets - 1x daily - 7x weekly

## 2019-04-20 ENCOUNTER — Other Ambulatory Visit: Payer: Self-pay

## 2019-04-20 ENCOUNTER — Ambulatory Visit: Payer: PPO | Attending: Neurosurgery | Admitting: Speech Pathology

## 2019-04-20 ENCOUNTER — Encounter: Payer: Self-pay | Admitting: Speech Pathology

## 2019-04-20 DIAGNOSIS — R4701 Aphasia: Secondary | ICD-10-CM
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
## 2019-04-22 ENCOUNTER — Ambulatory Visit: Payer: PPO | Admitting: Speech Pathology

## 2019-04-22 ENCOUNTER — Ambulatory Visit: Payer: PPO | Attending: Gastroenterology | Admitting: Physical Therapy

## 2019-04-22 ENCOUNTER — Encounter: Payer: Self-pay | Admitting: Speech Pathology

## 2019-04-22 ENCOUNTER — Other Ambulatory Visit: Payer: Self-pay

## 2019-04-22 ENCOUNTER — Encounter: Payer: Self-pay | Admitting: Physical Therapy

## 2019-04-22 DIAGNOSIS — R4701 Aphasia: Secondary | ICD-10-CM | POA: Diagnosis not present

## 2019-04-22 DIAGNOSIS — R279 Unspecified lack of coordination: Secondary | ICD-10-CM | POA: Diagnosis not present

## 2019-04-22 DIAGNOSIS — R293 Abnormal posture: Secondary | ICD-10-CM | POA: Diagnosis not present

## 2019-04-22 DIAGNOSIS — M6281 Muscle weakness (generalized): Secondary | ICD-10-CM | POA: Insufficient documentation
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
## 2019-04-27 ENCOUNTER — Encounter: Payer: Self-pay | Admitting: Speech Pathology

## 2019-04-27 ENCOUNTER — Ambulatory Visit: Payer: PPO | Admitting: Physical Therapy

## 2019-04-27 ENCOUNTER — Ambulatory Visit: Payer: PPO | Admitting: Speech Pathology

## 2019-04-27 ENCOUNTER — Encounter: Payer: Self-pay | Admitting: Physical Therapy

## 2019-04-27 ENCOUNTER — Other Ambulatory Visit: Payer: Self-pay

## 2019-04-27 DIAGNOSIS — R4701 Aphasia: Secondary | ICD-10-CM | POA: Diagnosis not present

## 2019-04-27 DIAGNOSIS — R293 Abnormal posture: Secondary | ICD-10-CM

## 2019-04-27 DIAGNOSIS — R279 Unspecified lack of coordination: Secondary | ICD-10-CM

## 2019-04-27 DIAGNOSIS — M6281 Muscle weakness (generalized): Secondary | ICD-10-CM
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
## 2019-04-27 NOTE — Patient Instructions (Signed)
Access Code: [email or web address]  URL: [email or web address]  Date: 04/27/2019  Prepared by: Jari Favre   Exercises  Supine Pelvic Floor Stretch - 3 reps - 1 sets - 30 sec hold - 1x daily - 7x weekly  Supine Diaphragmatic Breathing with Pelvic Floor Lengthening - 10 reps - 1 sets - 3x daily - 7x weekly  Seated Figure 4 Piriformis Stretch - 5 reps - 1 sets - 20 seconds hold - 1x daily - 7x weekly  Seated Pelvic Floor Contraction - 10 reps - 3 sets - 1x daily - 7x weekly  Seated Thoracic Lumbar Extension - 10 reps - 1 sets - 5 sec hold - 1x daily - 7x weekly  Seated Thoracic Flexion and Rotation with Arms Crossed - 10 reps - 1 sets - 1x daily - 7x weekly  Seated Scapular Retraction - 10 reps - 1 sets - 5 sec hold - 3x daily - 7x weekly

## 2019-04-29 ENCOUNTER — Encounter: Payer: Self-pay | Admitting: Speech Pathology

## 2019-04-29 ENCOUNTER — Ambulatory Visit: Payer: PPO | Admitting: Physical Therapy

## 2019-04-29 ENCOUNTER — Encounter: Payer: Self-pay | Admitting: Physical Therapy

## 2019-04-29 ENCOUNTER — Other Ambulatory Visit: Payer: Self-pay

## 2019-04-29 ENCOUNTER — Ambulatory Visit: Payer: PPO | Admitting: Speech Pathology

## 2019-04-29 DIAGNOSIS — R279 Unspecified lack of coordination: Secondary | ICD-10-CM | POA: Diagnosis not present

## 2019-04-29 DIAGNOSIS — R4701 Aphasia: Secondary | ICD-10-CM

## 2019-04-29 DIAGNOSIS — R293 Abnormal posture: Secondary | ICD-10-CM

## 2019-04-29 DIAGNOSIS — M6281 Muscle weakness (generalized): Secondary | ICD-10-CM
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
## 2019-04-29 NOTE — Patient Instructions (Signed)
Access Code: [email or web address]  URL: [email or web address]  Date: 04/29/2019  Prepared by: Jari Favre   Exercises  Supine Pelvic Floor Stretch - 3 reps - 1 sets - 30 sec hold - 1x daily - 7x weekly  Supine Diaphragmatic Breathing with Pelvic Floor Lengthening - 10 reps - 1 sets - 3x daily - 7x weekly  Seated Figure 4 Piriformis Stretch - 5 reps - 1 sets - 20 seconds hold - 1x daily - 7x weekly  Seated Pelvic Floor Contraction - 10 reps - 3 sets - 1x daily - 7x weekly  Seated Thoracic Lumbar Extension - 10 reps - 1 sets - 5 sec hold - 1x daily - 7x weekly  Seated Thoracic Flexion and Rotation with Arms Crossed - 10 reps - 1 sets - 1x daily - 7x weekly  Seated Scapular Retraction - 10 reps - 1 sets - 5 sec hold - 3x daily - 7x weekly  Patient Education  Biomedical scientist
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
## 2019-05-04 ENCOUNTER — Encounter: Payer: PPO | Admitting: Speech Pathology

## 2019-05-04 ENCOUNTER — Ambulatory Visit: Payer: PPO | Admitting: Physical Therapy

## 2019-05-04 ENCOUNTER — Other Ambulatory Visit: Payer: Self-pay

## 2019-05-04 ENCOUNTER — Encounter: Payer: Self-pay | Admitting: Physical Therapy

## 2019-05-04 ENCOUNTER — Other Ambulatory Visit: Payer: Self-pay | Admitting: Cardiology

## 2019-05-04 DIAGNOSIS — R279 Unspecified lack of coordination: Secondary | ICD-10-CM | POA: Diagnosis not present

## 2019-05-04 DIAGNOSIS — R293 Abnormal posture: Secondary | ICD-10-CM

## 2019-05-04 DIAGNOSIS — M6281 Muscle weakness (generalized): Secondary | ICD-10-CM
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
## 2019-05-06 ENCOUNTER — Ambulatory Visit: Payer: PPO | Admitting: Physical Therapy

## 2019-05-06 ENCOUNTER — Other Ambulatory Visit: Payer: Self-pay

## 2019-05-06 ENCOUNTER — Encounter: Payer: Self-pay | Admitting: Physical Therapy

## 2019-05-06 ENCOUNTER — Encounter: Payer: PPO | Admitting: Speech Pathology

## 2019-05-06 DIAGNOSIS — R293 Abnormal posture: Secondary | ICD-10-CM

## 2019-05-06 DIAGNOSIS — R279 Unspecified lack of coordination: Secondary | ICD-10-CM | POA: Diagnosis not present

## 2019-05-06 DIAGNOSIS — M6281 Muscle weakness (generalized): Secondary | ICD-10-CM
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
## 2019-05-11 ENCOUNTER — Encounter: Payer: Self-pay | Admitting: Speech Pathology

## 2019-05-11 ENCOUNTER — Ambulatory Visit: Payer: PPO | Admitting: Speech Pathology

## 2019-05-11 ENCOUNTER — Encounter: Payer: Self-pay | Admitting: Physical Therapy

## 2019-05-11 ENCOUNTER — Other Ambulatory Visit: Payer: Self-pay

## 2019-05-11 ENCOUNTER — Ambulatory Visit: Payer: PPO | Admitting: Physical Therapy

## 2019-05-11 DIAGNOSIS — R279 Unspecified lack of coordination: Secondary | ICD-10-CM

## 2019-05-11 DIAGNOSIS — M6281 Muscle weakness (generalized): Secondary | ICD-10-CM

## 2019-05-11 DIAGNOSIS — R4701 Aphasia: Secondary | ICD-10-CM | POA: Diagnosis not present

## 2019-05-11 DIAGNOSIS — R293 Abnormal posture: Secondary | ICD-10-CM
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
## 2019-05-13 ENCOUNTER — Encounter: Payer: Self-pay | Admitting: Speech Pathology

## 2019-05-13 ENCOUNTER — Other Ambulatory Visit: Payer: Self-pay

## 2019-05-13 ENCOUNTER — Ambulatory Visit: Payer: PPO | Admitting: Speech Pathology

## 2019-05-13 DIAGNOSIS — R4701 Aphasia: Secondary | ICD-10-CM

## 2019-05-13 DIAGNOSIS — S069X0A Unspecified intracranial injury without loss of consciousness, initial encounter: Secondary | ICD-10-CM | POA: Diagnosis not present

## 2019-05-13 NOTE — Patient Instructions (Signed)
   Consider doing mid level crossword puzzles to work on your word finding, especially after you are done with ST
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
## 2019-05-14 ENCOUNTER — Ambulatory Visit: Payer: PPO | Admitting: Physical Therapy

## 2019-05-14 ENCOUNTER — Encounter: Payer: Self-pay | Admitting: Physical Therapy

## 2019-05-14 DIAGNOSIS — R293 Abnormal posture: Secondary | ICD-10-CM

## 2019-05-14 DIAGNOSIS — R279 Unspecified lack of coordination: Secondary | ICD-10-CM

## 2019-05-14 DIAGNOSIS — M6281 Muscle weakness (generalized): Secondary | ICD-10-CM
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
## 2019-05-18 ENCOUNTER — Other Ambulatory Visit: Payer: Self-pay

## 2019-05-18 ENCOUNTER — Encounter: Payer: Self-pay | Admitting: Speech Pathology

## 2019-05-18 ENCOUNTER — Ambulatory Visit: Payer: PPO | Admitting: Speech Pathology

## 2019-05-18 DIAGNOSIS — R4701 Aphasia: Secondary | ICD-10-CM
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
## 2019-05-18 NOTE — Patient Instructions (Addendum)
   Continue practicing with the alphabet and a category, writing something for each letter of the alphabet in the category  Medium level cross word puzzle books  Play Scatergories without the timer - may find a used one  at [employer] level Aphasia/word finding workbooks - on [employer] - not too easy

## 2019-05-19 ENCOUNTER — Encounter: Payer: Self-pay | Admitting: Physical Therapy

## 2019-05-19 ENCOUNTER — Ambulatory Visit: Payer: PPO | Admitting: Physical Therapy

## 2019-05-19 DIAGNOSIS — R279 Unspecified lack of coordination: Secondary | ICD-10-CM

## 2019-05-19 DIAGNOSIS — M6281 Muscle weakness (generalized): Secondary | ICD-10-CM

## 2019-05-19 DIAGNOSIS — R293 Abnormal posture: Secondary | ICD-10-CM
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
## 2019-05-20 ENCOUNTER — Ambulatory Visit: Payer: PPO | Admitting: Speech Pathology

## 2019-05-21 ENCOUNTER — Other Ambulatory Visit: Payer: Self-pay

## 2019-05-21 ENCOUNTER — Ambulatory Visit: Payer: PPO | Attending: Gastroenterology | Admitting: Physical Therapy

## 2019-05-21 DIAGNOSIS — R293 Abnormal posture: Secondary | ICD-10-CM

## 2019-05-21 DIAGNOSIS — M6281 Muscle weakness (generalized): Secondary | ICD-10-CM | POA: Insufficient documentation

## 2019-05-21 DIAGNOSIS — R279 Unspecified lack of coordination: Secondary | ICD-10-CM
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
## 2019-05-25 ENCOUNTER — Other Ambulatory Visit: Payer: Self-pay

## 2019-05-25 ENCOUNTER — Ambulatory Visit: Payer: PPO | Admitting: Physical Therapy

## 2019-05-25 ENCOUNTER — Encounter: Payer: Self-pay | Admitting: Physical Therapy

## 2019-05-25 DIAGNOSIS — M6281 Muscle weakness (generalized): Secondary | ICD-10-CM

## 2019-05-25 DIAGNOSIS — R293 Abnormal posture: Secondary | ICD-10-CM

## 2019-05-25 DIAGNOSIS — R279 Unspecified lack of coordination: Secondary | ICD-10-CM
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
## 2019-05-27 ENCOUNTER — Telehealth: Payer: Self-pay | Admitting: Physical Therapy

## 2019-05-27 ENCOUNTER — Ambulatory Visit: Payer: PPO | Admitting: Physical Therapy

## 2019-05-27 NOTE — Telephone Encounter (Signed)
Pt was called due to no show.  She reports she had told the front desk to cancel her appointment, she is out of town.  Does not want to be discharged yet until she sees how it goes on her trip and the long car ride.  Gustavus Bryant, PT 05/27/19 2:21 PM

## 2019-06-01 ENCOUNTER — Other Ambulatory Visit: Payer: PPO

## 2019-06-02 DIAGNOSIS — H4912 Fourth [trochlear] nerve palsy, left eye: Secondary | ICD-10-CM | POA: Diagnosis not present

## 2019-06-02 DIAGNOSIS — H532 Diplopia: Secondary | ICD-10-CM | POA: Diagnosis not present

## 2019-06-02 DIAGNOSIS — H2640 Unspecified secondary cataract: Secondary | ICD-10-CM | POA: Diagnosis not present

## 2019-06-02 DIAGNOSIS — S069X9A Unspecified intracranial injury with loss of consciousness of unspecified duration, initial encounter: Secondary | ICD-10-CM | POA: Diagnosis not present

## 2019-06-03 ENCOUNTER — Ambulatory Visit
Admission: RE | Admit: 2019-06-03 | Discharge: 2019-06-03 | Disposition: A | Discharge status: Home / Self Care | Payer: PPO | Source: Ambulatory Visit | Attending: Internal Medicine | Admitting: Internal Medicine

## 2019-06-03 DIAGNOSIS — E042 Nontoxic multinodular goiter: Secondary | ICD-10-CM | POA: Diagnosis not present
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
## 2019-06-03 NOTE — Patient Instructions (Addendum)
  Tests ordered today. Your results will be released to [place] (or called to you) after review.  If any changes need to be made, you will be notified at that same time.   Medications reviewed and updated.  Changes include :   none  A referral was ordered for ENT to consider a biopsy of your thyroid nodule.     Please followup in 6 months

## 2019-06-04 ENCOUNTER — Encounter: Payer: Self-pay | Admitting: Internal Medicine

## 2019-06-04 ENCOUNTER — Other Ambulatory Visit (INDEPENDENT_AMBULATORY_CARE_PROVIDER_SITE_OTHER): Payer: PPO

## 2019-06-04 ENCOUNTER — Ambulatory Visit (INDEPENDENT_AMBULATORY_CARE_PROVIDER_SITE_OTHER): Payer: PPO | Admitting: Internal Medicine

## 2019-06-04 ENCOUNTER — Other Ambulatory Visit: Payer: Self-pay

## 2019-06-04 VITALS — BP 130/78 | HR 80 | Temp 98.3°F | Resp 16 | Wt 170.0 lb

## 2019-06-04 DIAGNOSIS — E782 Mixed hyperlipidemia: Secondary | ICD-10-CM | POA: Diagnosis not present

## 2019-06-04 DIAGNOSIS — R7303 Prediabetes: Secondary | ICD-10-CM

## 2019-06-04 DIAGNOSIS — I480 Paroxysmal atrial fibrillation: Secondary | ICD-10-CM | POA: Diagnosis not present

## 2019-06-04 DIAGNOSIS — E042 Nontoxic multinodular goiter: Secondary | ICD-10-CM

## 2019-06-04 LAB — CBC WITH DIFFERENTIAL/PLATELET
Basophils Absolute: 0 10*3/uL (ref 0.0–0.1)
Basophils Relative: 0.7 % (ref 0.0–3.0)
Eosinophils Absolute: 0.1 10*3/uL (ref 0.0–0.7)
Eosinophils Relative: 2.4 % (ref 0.0–5.0)
HCT: 38.7 % (ref 36.0–46.0)
Hemoglobin: 12.8 g/dL (ref 12.0–15.0)
Lymphocytes Relative: 31.3 % (ref 12.0–46.0)
Lymphs Abs: 1.9 10*3/uL (ref 0.7–4.0)
MCHC: 33 g/dL (ref 30.0–36.0)
MCV: 85 fl (ref 78.0–100.0)
Monocytes Absolute: 0.3 10*3/uL (ref 0.1–1.0)
Monocytes Relative: 5.3 % (ref 3.0–12.0)
Neutro Abs: 3.6 10*3/uL (ref 1.4–7.7)
Neutrophils Relative %: 60.3 % (ref 43.0–77.0)
Platelets: 284 10*3/uL (ref 150.0–400.0)
RBC: 4.55 Mil/uL (ref 3.87–5.11)
RDW: 15 % (ref 11.5–15.5)
WBC: 5.9 10*3/uL (ref 4.0–10.5)

## 2019-06-04 LAB — COMPREHENSIVE METABOLIC PANEL
ALT: 18 U/L (ref 0–35)
AST: 19 U/L (ref 0–37)
Albumin: 4.5 g/dL (ref 3.5–5.2)
Alkaline Phosphatase: 48 U/L (ref 39–117)
BUN: 20 mg/dL (ref 6–23)
CO2: 27 mEq/L (ref 19–32)
Calcium: 9.4 mg/dL (ref 8.4–10.5)
Chloride: 106 mEq/L (ref 96–112)
Creatinine, Ser: 0.85 mg/dL (ref 0.40–1.20)
GFR: 66.29 mL/min (ref >60.00)
Glucose, Bld: 81 mg/dL (ref 70–99)
Potassium: 3.8 mEq/L (ref 3.5–5.1)
Sodium: 141 mEq/L (ref 135–145)
Total Bilirubin: 0.5 mg/dL (ref 0.2–1.2)
Total Protein: 7.3 g/dL (ref 6.0–8.3)

## 2019-06-04 LAB — LIPID PANEL
Cholesterol: 211 mg/dL — ABNORMAL HIGH (ref 0–200)
HDL: 61.6 mg/dL (ref >39.00)
LDL Cholesterol: 131 mg/dL — ABNORMAL HIGH (ref 0–99)
NonHDL: 149.33
Total CHOL/HDL Ratio: 3
Triglycerides: 90 mg/dL (ref 0.0–149.0)
VLDL: 18 mg/dL (ref 0.0–40.0)

## 2019-06-04 LAB — TSH: TSH: 0.54 u[IU]/mL (ref 0.35–4.50)

## 2019-06-04 LAB — HEMOGLOBIN A1C: Hgb A1c MFr Bld: 5.7 % (ref 4.6–6.5)

## 2019-06-04 NOTE — Assessment & Plan Note (Signed)
Check a1c Low sugar / carb diet Stressed regular exercise   

## 2019-06-04 NOTE — Assessment & Plan Note (Signed)
Ultrasound yesterday showed multinodular thyroid, slight thyromegaly, radiology recommended considering FNA for right mid thyroid nodule that is suspicious Check TSH Will refer to ENT
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
## 2019-06-04 NOTE — Assessment & Plan Note (Signed)
Check lipid panel  Continue daily statin Regular exercise and healthy diet encouraged  

## 2019-06-08 ENCOUNTER — Encounter: Payer: Self-pay | Admitting: Internal Medicine

## 2019-06-09 DIAGNOSIS — M1711 Unilateral primary osteoarthritis, right knee: Secondary | ICD-10-CM | POA: Diagnosis not present

## 2019-06-12 ENCOUNTER — Ambulatory Visit: Payer: PPO | Admitting: Internal Medicine

## 2019-06-23 ENCOUNTER — Ambulatory Visit (INDEPENDENT_AMBULATORY_CARE_PROVIDER_SITE_OTHER): Payer: PPO | Admitting: Cardiovascular Disease

## 2019-06-23 ENCOUNTER — Other Ambulatory Visit: Payer: Self-pay

## 2019-06-23 ENCOUNTER — Encounter: Payer: Self-pay | Admitting: Cardiovascular Disease

## 2019-06-23 VITALS — BP 134/94 | HR 87 | Temp 97.7°F | Wt 169.0 lb

## 2019-06-23 DIAGNOSIS — R0789 Other chest pain: Secondary | ICD-10-CM

## 2019-06-23 DIAGNOSIS — I480 Paroxysmal atrial fibrillation: Secondary | ICD-10-CM | POA: Diagnosis not present

## 2019-06-23 DIAGNOSIS — E782 Mixed hyperlipidemia: Secondary | ICD-10-CM

## 2019-06-23 DIAGNOSIS — Z008 Encounter for other general examination: Secondary | ICD-10-CM | POA: Diagnosis not present

## 2019-06-23 NOTE — Patient Instructions (Signed)
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
## 2019-06-23 NOTE — Assessment & Plan Note (Signed)
History of hyperlipidemia on Pravachol with lipid profile performed 06/04/2019 revealing total cholesterol 211, LDL 131 and HDL of 61.  Pravachol was recently uptitrated and followed by her PCP.

## 2019-06-23 NOTE — Progress Notes (Signed)
06/23/2019 [name]   01-20-1951  [id number]  Primary Physician Binnie Rail, MD Primary Cardiologist: Lorretta Harp MD Kara Ramirez, [place]  HPI:  Kara Ramirez is a 68 y.o.  mildly overweight married Caucasian female mother of 2, grandmother of 1 grandchild whose husband Kara Ramirez is also patient mild is accompanying her today. She is retired from working in [employer].  I last saw her in the office 11/25/2018.Her primary care physician is Dr. Billey Gosling. I last saw her in the office 05/27/15.She really has no risk factors other than mild hyperlipidemia on red yeast rice. Her sister did have myocardial infarctions. She's had chest pain off and on for a year which is somewhat atypical. It occurs typically last for minutes at a time with occasional associated shortness of breath.Performed 2-D echocardiography and Myoview stress testing July 2016 which were normal. She was recently seen at [facility] in September with chest pain and shortness of breath. A dobutamine echo was normal. She saw [facility] in [facility] 09/02/17 who ordered an event monitor that did show some brief runs of PAF.    She did have a VATS procedure by Dr. Roxan Hockey revealing a mass which was nonmalignant.  Coronary CTA performed 12/10/2017 revealing a coronary calcium score 125 with minimal CAD.  She gets occasional atypical chest pain.    Current Meds  Medication Sig  . acetaminophen (TYLENOL) 325 MG tablet Take 1-2 tablets (325-650 mg total) by mouth every 4 (four) hours as needed for mild pain. (Patient taking differently: Take 325 mg by mouth every 4 (four) hours as needed for mild pain or headache. )  . albuterol (PROAIR HFA) 108 (90 Base) MCG/ACT inhaler INHALE 2 PUFFS INTO THE LUNGS EVERY 4 HOURS AS NEEDED FOR COUGH OR WHEEZE. MAY USE 2 PUFFS 10 TO 20 MINUTES PRIOR TO EXERCISE  . aspirin-acetaminophen-caffeine (EXCEDRIN MIGRAINE) 250-250-65 MG tablet Take 1 tablet by mouth every 6 (six)  hours as needed for headache.  . benzonatate (TESSALON PERLES) 100 MG capsule Take 1 capsule (100 mg total) by mouth 3 (three) times daily as needed.  . budesonide-formoterol (SYMBICORT) 80-4.5 MCG/ACT inhaler Inhale 2 puffs into the lungs 2 (two) times daily.  . Calcium Carbonate-Vitamin D (CALCIUM 600+D) 600-400 MG-UNIT per tablet Take 1 tablet by mouth daily.   . Carboxymethylcellul-Glycerin (REFRESH OPTIVE) 1-0.9 % GEL Place 1 application into both eyes at bedtime.   . cholecalciferol (VITAMIN D) 1000 UNITS tablet Take 1,000 Units by mouth daily.    [name] Co-Enzyme Q-10 100 MG CAPS Take 100 mg by mouth daily.   . fluocinonide ointment (LIDEX) 7.78 % Apply 1 application topically See admin instructions. Apply topically twice daily for 5 days alternating with Tacrolimus ointment  . fluticasone (FLONASE) 50 MCG/ACT nasal spray USE 1 SPRAY IN EACH NOSTRIL MID-DAY FOR CONGESTION (Patient taking differently: Place 1 spray into both nostrils daily. )  . fluticasone (FLOVENT HFA) 110 MCG/ACT inhaler Inhale 2 puffs into the lungs 2 (two) times daily.  [name] ibandronate (BONIVA) 150 MG tablet Take 150 mg by mouth every 30 (thirty) days. Take in the morning with a full glass of water, on an empty stomach, and do not take anything else by mouth or lie down for the next 30 min.  [name] ipratropium (ATROVENT) 0.03 % nasal spray Place 1 spray into both nostrils every 6 (six) hours as needed for rhinitis.   [name] ketotifen (ZADITOR) 0.025 % ophthalmic solution Place 1 drop into  both eyes 2 (two) times daily as needed (for dry eyes).   [name] levocetirizine (XYZAL) 5 MG tablet Take 1 tablet (5 mg total) by mouth every evening.  [name] MALIC ACID PO Take 1 capsule by mouth at bedtime.  . metoprolol tartrate (LOPRESSOR) 25 MG tablet TAKE ONE-HALF TABLET BY MOUTH TWO TIMES A DAY  . mirabegron ER (MYRBETRIQ) 50 MG TB24 tablet Take 50 mg by mouth daily.  . naproxen sodium (ALEVE) 220 MG tablet Take 220 mg by mouth daily as needed.   .  Olopatadine HCl 0.2 % SOLN Apply to eye 2 (two) times daily as needed.  . Omega-3 Fatty Acids (FISH OIL) 1000 MG CAPS Take 1,000 mg by mouth at bedtime.   . polyethylene glycol (MIRALAX) 17 g packet Take 17 g by mouth daily as needed. Increase number of doses as needed  . pravastatin (PRAVACHOL) 10 MG tablet Take 1 tablet (10 mg total) by mouth 3 (three) times a week.  . senna (SENOKOT) 8.6 MG tablet Take 1 tablet by mouth daily.  . sodium chloride (OCEAN) 0.65 % SOLN nasal spray Place 1 spray into both nostrils as needed for congestion. (Patient taking differently: Place 1 spray into both nostrils 4 (four) times daily as needed for congestion. )  . tacrolimus (PROTOPIC) 0.1 % ointment Apply 1 application topically 2 (two) times daily as needed (PRN skin issues). (Patient taking differently: Apply 1 application topically See admin instructions. Apply topically twice daily for 5 days alternating with Fluocinonide ointment)  . Thiamine HCl (VITAMIN B-1) 100 MG tablet Take 100 mg by mouth daily.    . traMADol (ULTRAM) 50 MG tablet Take 1-2 tablets (50-100 mg total) by mouth every 6 (six) hours as needed for moderate pain.     Allergies  Allergen Reactions  . Mold Extract [Trichophyton Mentagrophyte] Shortness Of Breath and Rash  . Penicillins Shortness Of Breath, Rash and Other (See Comments)    Eyes puffy Has taken low dose pcn and no rx REACTION: rash, SOB Has patient had a PCN reaction causing immediate rash, facial/tongue/throat swelling, SOB or lightheadedness with hypotension: yes Has patient had a PCN reaction causing severe rash involving mucus membranes or skin necrosis: unk Has patient had a PCN reaction that required hospitalization: no Has patient had a PCN reaction occurring within the last 10 years: unk If all of the above answers are "NO", then may proceed with Cephalospor  . Morphine Other (See Comments)    REACTION: tachycardia and anxiety  . Peanut Oil Nausea And Vomiting     Peanut butter  . Protonix [Pantoprazole Sodium] Nausea And Vomiting  . Citrus Other (See Comments)    Unknown  . Peanut-Containing Drug Products Other (See Comments)    Unknown  . Tramadol Other (See Comments)    Makes crazy;confused  . Valium [Diazepam] Other (See Comments)    Confusion per family  . Cetirizine Rash and Other (See Comments)    Around face  . Codeine Other (See Comments)    REACTION: dizzy and "groggy in my head"  . Eggs Or Egg-Derived Products Nausea And Vomiting  . Latex Itching  . Pentazocine Lactate Palpitations    Social History   Socioeconomic History  . Marital status: Married    Spouse name: Not on file  . Number of children: 2  . Years of education: Not on file  . Highest education level: Not on file  Occupational History  . Occupation: retired    Fish farm manager: [employer]  Social Needs  . Financial resource strain: Not hard at all  . Food insecurity    Worry: Never true    Inability: Never true  . Transportation needs    Medical: No    Non-medical: No  Tobacco Use  . Smoking status: Never Smoker  . Smokeless tobacco: Never Used  Substance and Sexual Activity  . Alcohol use: Never    Alcohol/week: 0.0 standard drinks    Frequency: Never  . Drug use: Never  . Sexual activity: Not Currently  Lifestyle  . Physical activity    Days per week: 4 days    Minutes per session: 40 min  . Stress: To some extent  Relationships  . Social connections    Talks on phone: More than three times a week    Gets together: More than three times a week    Attends religious service: Not on file    Active member of club or organization: Not on file    Attends meetings of clubs or organizations: Not on file    Relationship status: Married  . Intimate partner violence    Fear of current or ex partner: No    Emotionally abused: No    Physically abused: No    Forced sexual activity: No  Other Topics Concern  . Not on file  Social History  Narrative  . Not on file     Review of Systems: General: negative for chills, fever, night sweats or weight changes.  Cardiovascular: negative for chest pain, dyspnea on exertion, edema, orthopnea, palpitations, paroxysmal nocturnal dyspnea or shortness of breath Dermatological: negative for rash Respiratory: negative for cough or wheezing Urologic: negative for hematuria Abdominal: negative for nausea, vomiting, diarrhea, bright red blood per rectum, melena, or hematemesis Neurologic: negative for visual changes, syncope, or dizziness All other systems reviewed and are otherwise negative except as noted above.    Blood pressure (!) 134/94, pulse 87, temperature 97.7 F (36.5 C), weight 169 lb (76.7 kg), SpO2 98 %.  General appearance: alert and no distress Neck: no adenopathy, no carotid bruit, no JVD, supple, symmetrical, trachea midline and thyroid not enlarged, symmetric, no tenderness/mass/nodules Lungs: clear to auscultation bilaterally Heart: regular rate and rhythm, S1, S2 normal, no murmur, click, rub or gallop Extremities: extremities normal, atraumatic, no cyanosis or edema Pulses: 2+ and symmetric Skin: Skin color, texture, turgor normal. No rashes or lesions Neurologic: Alert and oriented X 3, normal strength and tone. Normal symmetric reflexes. Normal coordination and gait  EKG sinus rhythm 81 with nonspecific ST and T wave changes.  I personally reviewed this EKG.  ASSESSMENT AND PLAN:   Hyperlipidemia History of hyperlipidemia on Pravachol with lipid profile performed 06/04/2019 revealing total cholesterol 211, LDL 131 and HDL of 61.  Pravachol was recently uptitrated and followed by her PCP.  Paroxysmal atrial fibrillation (HCC) History of PAF on beta-blockade currently maintaining sinus rhythm.  She was on amiodarone in the past.  She is not on oral anticoagulants because of prior closed head injury.  Chest tightness History of atypical chest pain with negative  Myoview in the past and a coronary CTA revealing a coronary calcium score which was 125 with minimal CAD.      Lorretta Harp MD FACP,FACC,FAHA, FSCAI 06/23/2019 5:00 PM

## 2019-06-23 NOTE — Assessment & Plan Note (Addendum)
History of atypical chest pain with negative Myoview in the past and a coronary CTA revealing a coronary calcium score which was 125 with minimal CAD.

## 2019-06-30 ENCOUNTER — Encounter: Payer: Self-pay | Admitting: Allergy & Immunology

## 2019-06-30 ENCOUNTER — Other Ambulatory Visit: Payer: Self-pay

## 2019-06-30 ENCOUNTER — Ambulatory Visit (INDEPENDENT_AMBULATORY_CARE_PROVIDER_SITE_OTHER): Payer: PPO | Admitting: Allergy & Immunology

## 2019-06-30 VITALS — BP 108/78 | HR 94 | Temp 98.0°F | Resp 16 | Ht 66.0 in

## 2019-06-30 DIAGNOSIS — J3089 Other allergic rhinitis: Secondary | ICD-10-CM | POA: Diagnosis not present

## 2019-06-30 DIAGNOSIS — E042 Nontoxic multinodular goiter: Secondary | ICD-10-CM | POA: Diagnosis not present

## 2019-06-30 DIAGNOSIS — B999 Unspecified infectious disease: Secondary | ICD-10-CM | POA: Diagnosis not present

## 2019-06-30 DIAGNOSIS — J8410 Pulmonary fibrosis, unspecified: Secondary | ICD-10-CM | POA: Diagnosis not present

## 2019-06-30 DIAGNOSIS — J302 Other seasonal allergic rhinitis: Secondary | ICD-10-CM

## 2019-06-30 DIAGNOSIS — J4530 Mild persistent asthma, uncomplicated: Secondary | ICD-10-CM | POA: Diagnosis not present

## 2019-06-30 MED ORDER — IPRATROPIUM BROMIDE 0.03 % NA SOLN: 1.0000 | Freq: Four times a day (QID) | NASAL | 2 refills | Status: DC | PRN

## 2019-06-30 MED ORDER — ALBUTEROL SULFATE HFA 108 (90 BASE) MCG/ACT IN AERS: 1.0000 | INHALATION_SPRAY | Freq: Four times a day (QID) | RESPIRATORY_TRACT | 1 refills | Status: DC | PRN

## 2019-06-30 MED ORDER — FLOVENT HFA 110 MCG/ACT IN AERO: 2.0000 | INHALATION_SPRAY | Freq: Two times a day (BID) | RESPIRATORY_TRACT | 1 refills | Status: DC

## 2019-06-30 NOTE — Patient Instructions (Addendum)
1. Mild persistent asthma, uncomplicated - Lung testing looked fairly decent today. - Since you felt that the Gleed worked better, we will go back to that to see how you do.  - Please call us with an update at the end of the week. - We can send in prednisone at that time if needed.   - I am going to talk to Dr. Quay Burow to see what the plan is about re-imaging your lungs.  - Spacer use reviewed. - Daily controller medication(s): Flovent 163mcg 2 puffs twice daily with spacer - Prior to physical activity: albuterol 2 puffs 10-15 minutes before physical activity. - Rescue medications: albuterol 4 puffs every 4-6 hours as needed - Asthma control goals:  * Full participation in all desired activities (may need albuterol before activity) * Albuterol use two time or less a week on average (not counting use with activity) * Cough interfering with sleep two time or less a month * Oral steroids no more than once a year * No hospitalizations  2. Seasonal and perennial allergic rhinitis - Continue with the Flonase two sprays per nostril daily. - Continue with nasal ipratropium one spray per nostril every 8 hours.  - Go back to Claritin to see if this works better.  - Samples of RyVent provided (use every 8 hours as needed).  3. Return in about 1 month (around 07/31/2019) for ALLERGY TESTING. This can be an in-person, a virtual Webex or a telephone follow up visit.  Please inform us of any Emergency Department visits, hospitalizations, or changes in symptoms. Call us before going to the ED for breathing or allergy symptoms since we might be able to fit you in for a sick visit. Feel free to contact us anytime with any questions, problems, or concerns.  It was a pleasure to see you again today!  Websites that have reliable patient information: 1. American Academy of Asthma, Allergy, and Immunology: www.aaaai.org 2. Food Allergy Research and Education (FARE): foodallergy.org 3. Mothers of Asthmatics:  http://www.asthmacommunitynetwork.org 4. American College of Allergy, Asthma, and Immunology: www.acaai.org  "Like" [place] on Facebook and Instagram for our latest updates!      Make sure you are registered to vote! If you have moved or changed any of your contact information, you will need to get this updated before voting!  In some cases, you MAY be able to register to vote online: [email or web address]    Voter ID laws are NOT going into effect for the General Election in November 2020! DO NOT let this stop you from exercising your right to vote!   Absentee voting is the SAFEST way to vote during the coronavirus pandemic!   Download and print an absentee ballot request form at [email or web address] or you can scan the QR code below with your smart phone:      More information on absentee ballots can be found here: [email or web address]
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
## 2019-07-02 ENCOUNTER — Other Ambulatory Visit: Payer: Self-pay | Admitting: Internal Medicine

## 2019-07-02 ENCOUNTER — Other Ambulatory Visit: Payer: Self-pay | Admitting: Otolaryngology

## 2019-07-02 DIAGNOSIS — E042 Nontoxic multinodular goiter: Secondary | ICD-10-CM

## 2019-07-04 IMAGING — DX DG CHEST 1V SAME DAY
1 series · 1 of 1 positions shown · non-contrast
Comparison: 08/13/2018

CLINICAL DATA: Status post left chest tube removal

EXAM:
CHEST - 1 VIEW SAME DAY

[chest]
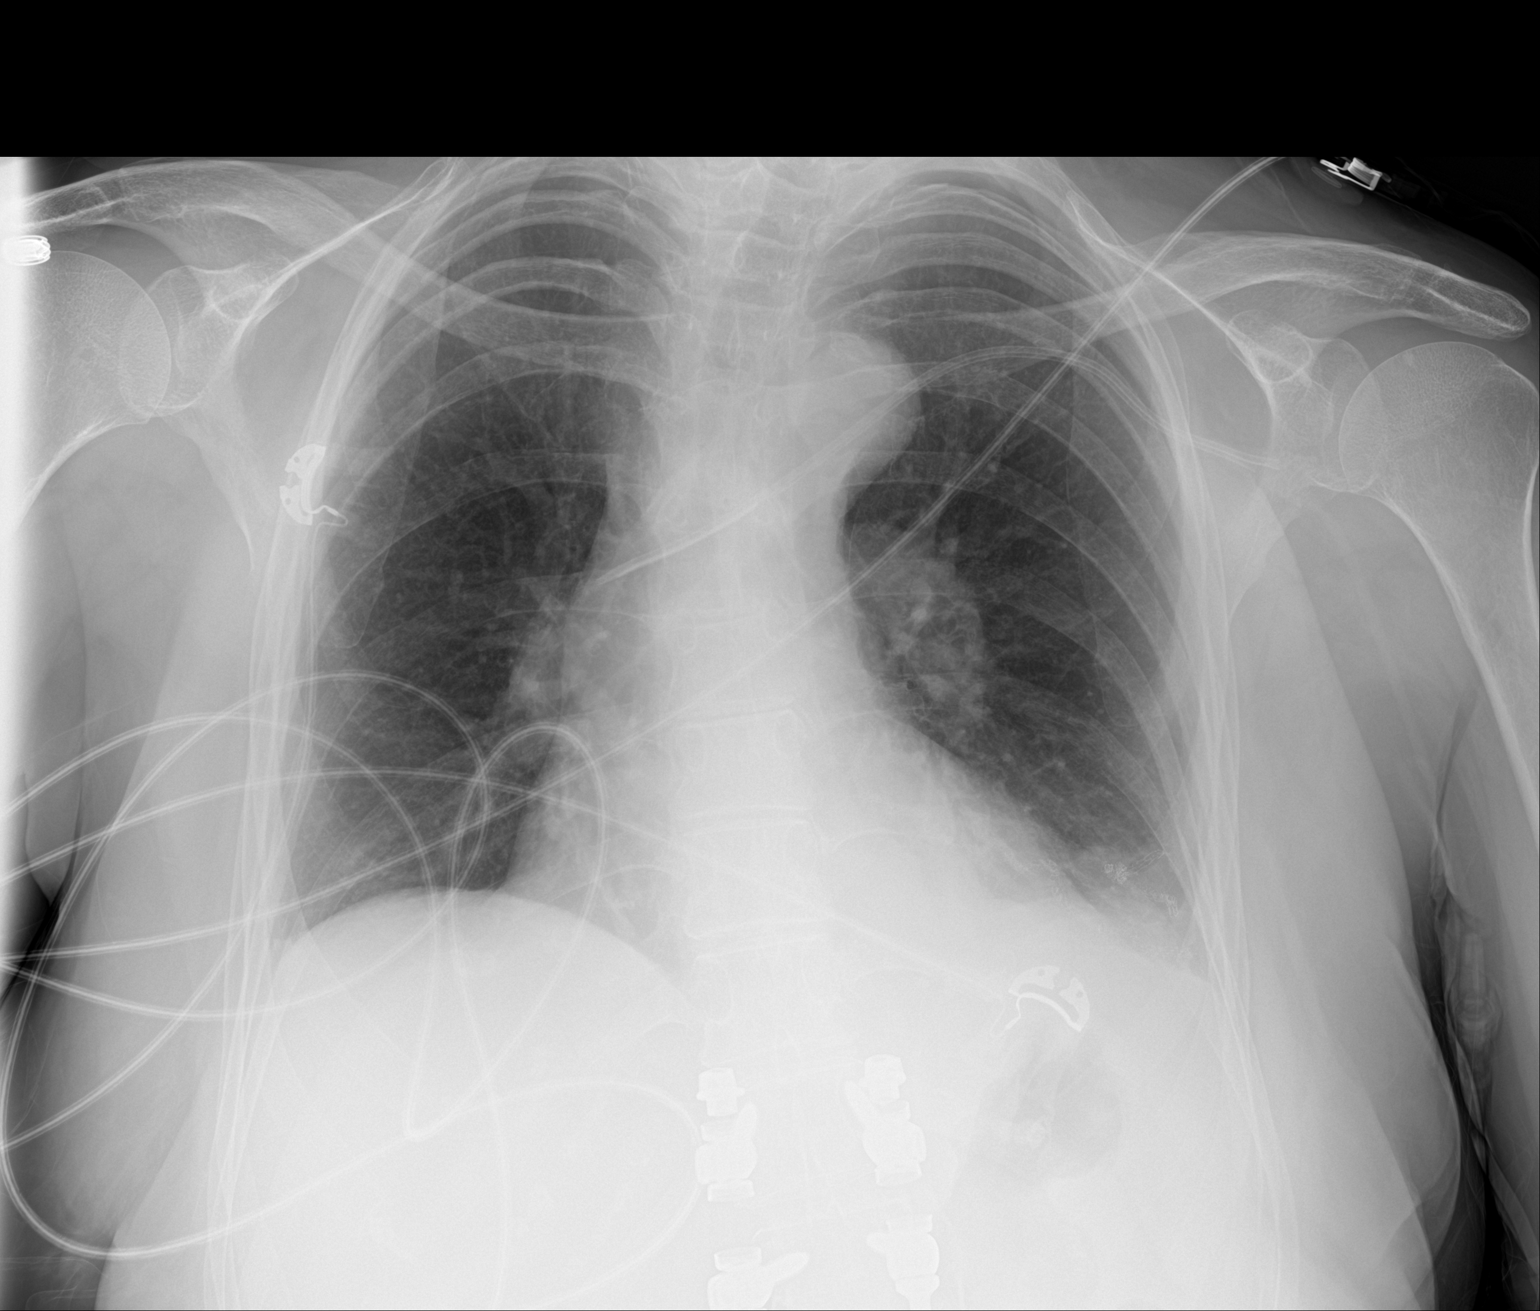

[1 of 1 positions shown; findings below may reference images not displayed]

FINDINGS: Postsurgical changes are again seen on the left. Left subclavian
central line is again seen. The left chest tube has been removed. A
minimal recurrent pneumothorax is noted in the left apex. Mild
atelectatic changes are noted in the left base as well. Right lung
is clear.
IMPRESSION: Minimal recurrent pneumothorax following chest tube removal.

These results will be called to the ordering clinician or
representative by the Radiologist Assistant, and communication
documented in the PACS or zVision Dashboard.

## 2019-07-04 IMAGING — DX DG CHEST 1V PORT
1 series · 1 of 1 positions shown · non-contrast
Comparison: 08/12/2018

CLINICAL DATA: Follow-up chest tube

EXAM:
PORTABLE CHEST 1 VIEW

[chest]
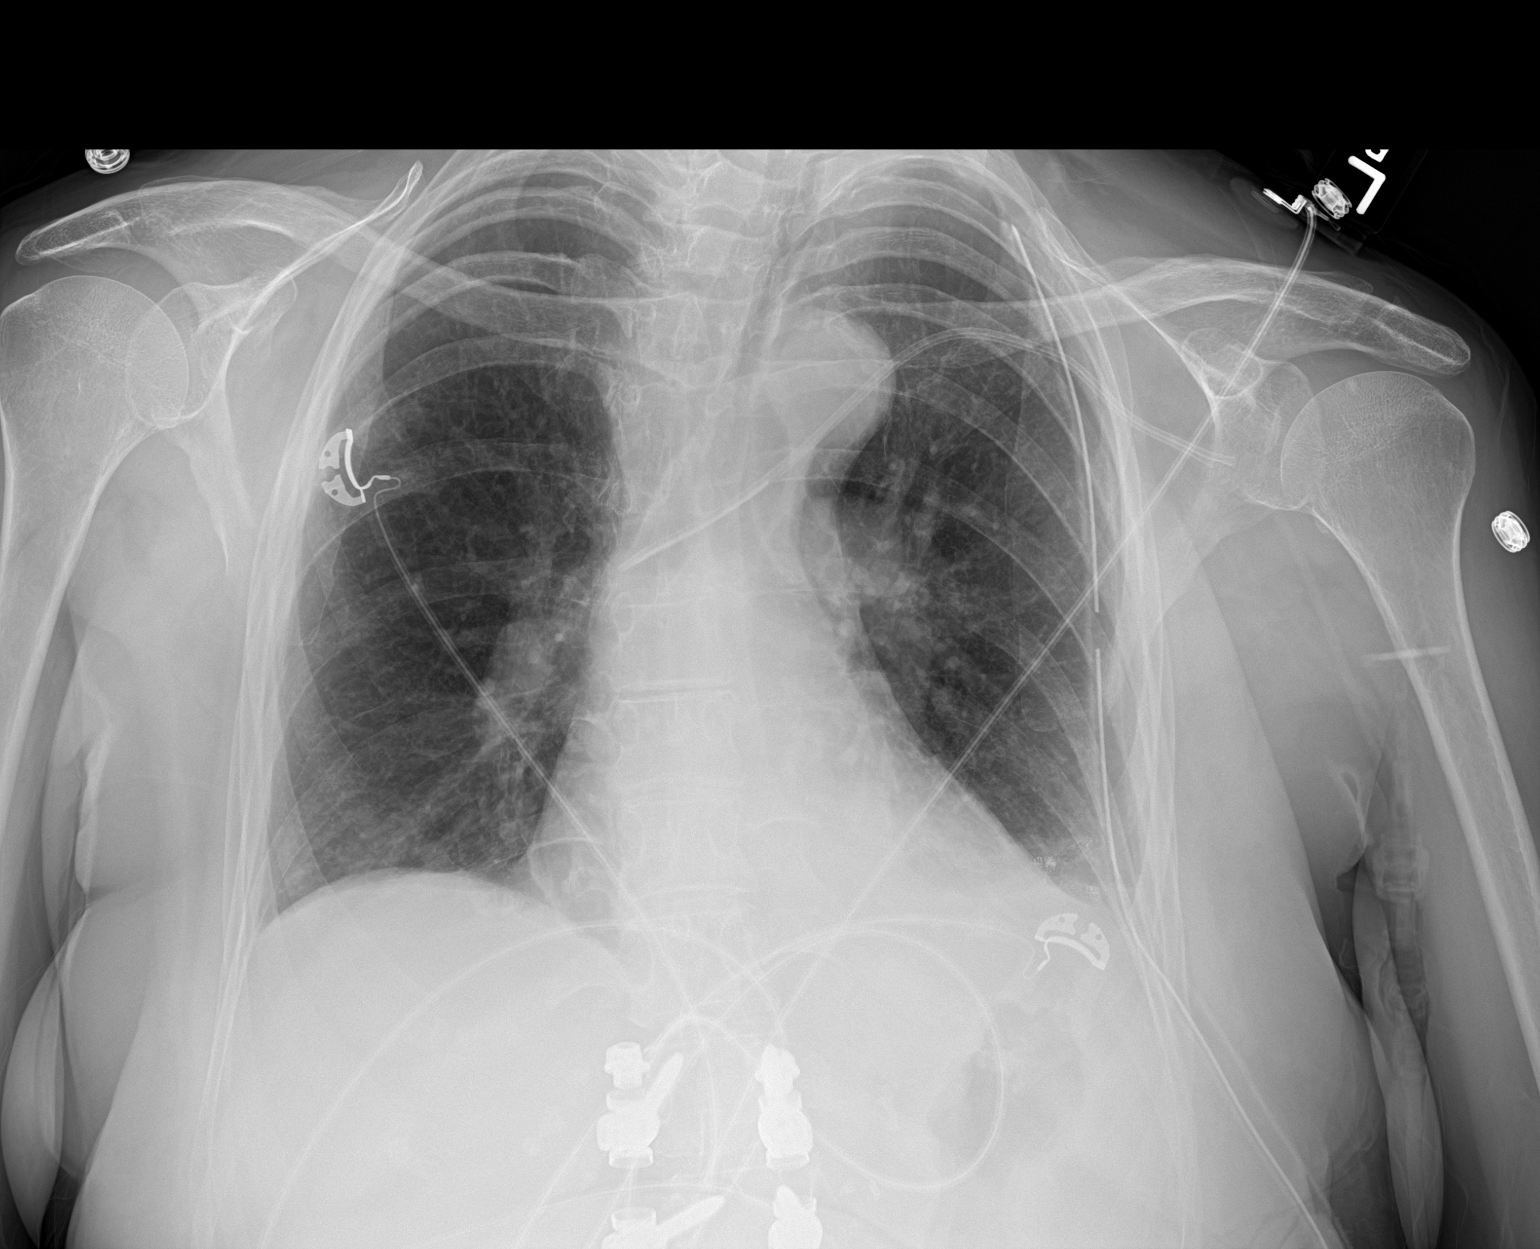

[1 of 1 positions shown; findings below may reference images not displayed]

FINDINGS: Cardiac shadow is stable. Left subclavian central line is again seen
and stable. Left chest tube is noted as well as postsurgical changes
in the left base. No pneumothorax is seen. The right lung remains
clear.
IMPRESSION: Postsurgical changes on the left.  No pneumothorax is noted.

## 2019-07-24 ENCOUNTER — Encounter: Payer: Self-pay | Admitting: Internal Medicine

## 2019-07-26 ENCOUNTER — Other Ambulatory Visit: Payer: Self-pay | Admitting: Cardiovascular Disease

## 2019-07-28 ENCOUNTER — Other Ambulatory Visit: Payer: Self-pay

## 2019-07-28 ENCOUNTER — Other Ambulatory Visit: Payer: Self-pay | Admitting: Cardiovascular Disease

## 2019-07-28 MED ORDER — PRAVASTATIN SODIUM 10 MG PO TABS: ORAL_TABLET | ORAL | 1 refills | Status: DC

## 2019-07-29 MED ORDER — METOPROLOL TARTRATE 25 MG PO TABS: ORAL_TABLET | ORAL | 2 refills | Status: DC

## 2019-07-30 ENCOUNTER — Other Ambulatory Visit: Payer: PPO

## 2019-07-30 ENCOUNTER — Ambulatory Visit: Payer: PPO | Admitting: Allergy & Immunology

## 2019-08-01 ENCOUNTER — Other Ambulatory Visit: Payer: Self-pay | Admitting: Cardiovascular Disease

## 2019-08-11 ENCOUNTER — Ambulatory Visit (INDEPENDENT_AMBULATORY_CARE_PROVIDER_SITE_OTHER): Payer: PPO | Admitting: Allergy & Immunology

## 2019-08-11 ENCOUNTER — Encounter: Payer: Self-pay | Admitting: Allergy & Immunology

## 2019-08-11 ENCOUNTER — Other Ambulatory Visit: Payer: Self-pay

## 2019-08-11 VITALS — BP 110/78 | HR 92 | Temp 97.7°F | Resp 16 | Ht 64.0 in | Wt 168.2 lb

## 2019-08-11 DIAGNOSIS — J3089 Other allergic rhinitis: Secondary | ICD-10-CM | POA: Diagnosis not present

## 2019-08-11 DIAGNOSIS — J302 Other seasonal allergic rhinitis: Secondary | ICD-10-CM

## 2019-08-11 DIAGNOSIS — J4530 Mild persistent asthma, uncomplicated: Secondary | ICD-10-CM | POA: Diagnosis not present

## 2019-08-11 DIAGNOSIS — B999 Unspecified infectious disease: Secondary | ICD-10-CM | POA: Diagnosis not present

## 2019-08-11 NOTE — Patient Instructions (Addendum)
1. Mild persistent asthma, uncomplicated - Lung testing looked excellent today. - Since you felt that the [place] worked better, we will go back to that to see how you do.  - Daily controller medication(s): Flovent 145mcg 2 puffs twice daily with spacer - Prior to physical activity: albuterol 2 puffs 10-15 minutes before physical activity. - Rescue medications: albuterol 4 puffs every 4-6 hours as needed - Asthma control goals:  * Full participation in all desired activities (may need albuterol before activity) * Albuterol use two time or less a week on average (not counting use with activity) * Cough interfering with sleep two time or less a month * Oral steroids no more than once a year * No hospitalizations  2. Seasonal and perennial allergic rhinitis - Testing today showed: grasses, indoor molds and outdoor molds - Copy of test results provided.  - Avoidance measures provided. - Continue with the Flonase two sprays per nostril daily. - Continue with nasal ipratropium one spray per nostril every 8 hours.  - Continue with Claritin to see if this works better.  - Information on allergen immunotherapy provided.  3. Recurrent infections - We will obtain some screening labs to evaluate your immune system.  - Labs to evaluate the quantitative [facility]) aspects of your immune system: IgG/IgA/IgM, CBC with differential - Labs to evaluate the qualitative ([place]) aspects of your immune system: CH50, Pneumococcal titers, Tetanus titers, Diphtheria titers - We may consider immunizations with Pneumovax and Tdap to challenge your immune system, and then obtain repeat titers in 4-6 weeks.   4. Return in about 6 months (around 02/08/2020). This can be an in-person, a virtual Webex or a telephone follow up visit.   Please inform us of any Emergency Department visits, hospitalizations, or changes in symptoms. Call us before going to the ED for breathing or allergy symptoms since we might  be able to fit you in for a sick visit. Feel free to contact us anytime with any questions, problems, or concerns.  It was a pleasure to see you again today!  Websites that have reliable patient information: 1. American Academy of Asthma, Allergy, and Immunology: www.aaaai.org 2. Food Allergy Research and Education (FARE): foodallergy.org 3. Mothers of Asthmatics: http://www.asthmacommunitynetwork.org 4. American College of Allergy, Asthma, and Immunology: www.acaai.org  "Like" [place] on Facebook and Instagram for our latest updates!      Make sure you are registered to vote! If you have moved or changed any of your contact information, you will need to get this updated before voting!  In some cases, you MAY be able to register to vote online: [email or web address]    Voter ID laws are NOT going into effect for the General Election in November 2020! DO NOT let this stop you from exercising your right to vote!   Absentee voting is the SAFEST way to vote during the coronavirus pandemic!   Download and print an absentee ballot request form at [email or web address] or you can scan the QR code below with your smart phone:      More information on absentee ballots can be found here: [email or web address]  [place] VOTING SITES have been established! You can register to vote and cast your vote on the same day at these locations. See this site for more information on what you need to register to vote: http://rodriguez.[email or web address]/        Reducing Pollen Exposure  The American Academy of Allergy, Asthma and Immunology suggests the following steps to reduce  your exposure to pollen during allergy seasons.    1. Do not hang sheets or clothing out to dry; pollen may collect on these items. 2. Do not mow lawns or spend time around freshly cut grass; mowing stirs up pollen. 3. Keep windows closed at night.  Keep car  windows closed while driving. 4. Minimize morning activities outdoors, a time when pollen counts are usually at their highest. 5. Stay indoors as much as possible when pollen counts or humidity is high and on windy days when pollen tends to remain in the air longer. 6. Use air conditioning when possible.  Many air conditioners have filters that trap the pollen spores. 7. Use a HEPA room air filter to remove pollen form the indoor air you breathe.  Control of Mold Allergen   Mold and fungi can grow on a variety of surfaces provided certain temperature and moisture conditions exist.  Outdoor molds grow on plants, decaying vegetation and soil.  The major outdoor mold, Alternaria and Cladosporium, are found in very high numbers during hot and dry conditions.  Generally, a late Summer - Fall peak is seen for common outdoor fungal spores.  Rain will temporarily lower outdoor mold spore count, but counts rise rapidly when the rainy period ends.  The most important indoor molds are Aspergillus and Penicillium.  Dark, humid and poorly ventilated basements are ideal sites for mold growth.  The next most common sites of mold growth are the bathroom and the kitchen.  Outdoor (Seasonal) Mold Control  Positive outdoor molds via skin testing: Bipolaris (Helminthsporium), Drechslera (Curvalaria) and Mucor  1. Use air conditioning and keep windows closed 2. Avoid exposure to decaying vegetation. 3. Avoid leaf raking. 4. Avoid grain handling. 5. Consider wearing a face mask if working in moldy areas.  6.   Indoor (Perennial) Mold Control   Positive indoor molds via skin testing: Aspergillus, Penicillium, Fusarium, Aureobasidium (Pullulara) and Rhizopus  1. Maintain humidity below 50%. 2. Clean washable surfaces with 5% bleach solution. 3. Remove sources e.g. contaminated carpets.   Allergy Shots   Allergies are the result of a chain reaction that starts in the immune system. Your immune system controls  how your body defends itself. For instance, if you have an allergy to pollen, your immune system identifies pollen as an invader or allergen. Your immune system overreacts by producing antibodies called Immunoglobulin E (IgE). These antibodies travel to cells that release chemicals, causing an allergic reaction.  The concept behind allergy immunotherapy, whether it is received in the form of shots or tablets, is that the immune system can be desensitized to specific allergens that trigger allergy symptoms. Although it requires time and patience, the payback can be long-term relief.  How Do Allergy Shots Work?  Allergy shots work much like a vaccine. Your body responds to injected amounts of a particular allergen given in increasing doses, eventually developing a resistance and tolerance to it. Allergy shots can lead to decreased, minimal or no allergy symptoms.  There generally are two phases: build-up and maintenance. Build-up often ranges from three to six months and involves receiving injections with increasing amounts of the allergens. The shots are typically given once or twice a week, though more rapid build-up schedules are sometimes used.  The maintenance phase begins when the most effective dose is reached. This dose is different for each person, depending on how allergic you are and your response to the build-up injections. Once the maintenance dose is reached, there are longer periods  between injections, typically two to four weeks.  Occasionally doctors give cortisone-type shots that can temporarily reduce allergy symptoms. These types of shots are different and should not be confused with allergy immunotherapy shots.  Who Can Be Treated with Allergy Shots?  Allergy shots may be a good treatment approach for people with allergic rhinitis (hay fever), allergic asthma, conjunctivitis (eye allergy) or stinging insect allergy.   Before deciding to begin allergy shots, you should consider:   . The length of allergy season and the severity of your symptoms . Whether medications and/or changes to your environment can control your symptoms . Your desire to avoid long-term medication use . Time: allergy immunotherapy requires a major time commitment . Cost: may vary depending on your insurance coverage  Allergy shots for children age 61 and older are effective and often well tolerated. They might prevent the onset of new allergen sensitivities or the progression to asthma.  Allergy shots are not started on patients who are pregnant but can be continued on patients who become pregnant while receiving them. In some patients with other medical conditions or who take certain common medications, allergy shots may be of risk. It is important to mention other medications you talk to your allergist.   When Will I Feel Better?  Some may experience decreased allergy symptoms during the build-up phase. For others, it may take as long as 12 months on the maintenance dose. If there is no improvement after a year of maintenance, your allergist will discuss other treatment options with you.  If you aren't responding to allergy shots, it may be because there is not enough dose of the allergen in your vaccine or there are missing allergens that were not identified during your allergy testing. Other reasons could be that there are high levels of the allergen in your environment or major exposure to non-allergic triggers like tobacco smoke.  What Is the Length of Treatment?  Once the maintenance dose is reached, allergy shots are generally continued for three to five years. The decision to stop should be discussed with your allergist at that time. Some people may experience a permanent reduction of allergy symptoms. Others may relapse and a longer course of allergy shots can be considered.  What Are the Possible Reactions?  The two types of adverse reactions that can occur with allergy shots are local  and systemic. Common local reactions include very mild redness and swelling at the injection site, which can happen immediately or several hours after. A systemic reaction, which is less common, affects the entire body or a particular body system. They are usually mild and typically respond quickly to medications. Signs include increased allergy symptoms such as sneezing, a stuffy nose or hives.  Rarely, a serious systemic reaction called anaphylaxis can develop. Symptoms include swelling in the throat, wheezing, a feeling of tightness in the chest, nausea or dizziness. Most serious systemic reactions develop within 30 minutes of allergy shots. This is why it is strongly recommended you wait in your doctor's office for 30 minutes after your injections. Your allergist is trained to watch for reactions, and his or her staff is trained and equipped with the proper medications to identify and treat them.  Who Should Administer Allergy Shots?  The preferred location for receiving shots is your prescribing allergist's office. Injections can sometimes be given at another facility where the physician and staff are trained to recognize and treat reactions, and have received instructions by your prescribing allergist.

## 2019-08-11 NOTE — Progress Notes (Signed)
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 Mild persistent asthma, uncomplicated - Lung testing looked excellent today. - Since you felt that the [place] worked better, we will go back to that to see how you do.  - Daily controller medication(s): Flovent 129mcg 2 puffs twice daily with spacer - Prior to physical activity: albuterol 2 puffs 10-15 minutes before physical activity. - Rescue medications: albuterol 4 puffs every 4-6 hours as needed - Asthma control goals:  * Full participation in all desired activities (may need albuterol before activity) * Albuterol use two time or less a week on average (not counting use with activity) * Cough interfering with sleep two time or less a month * Oral steroids no more than once a year * No hospitalizations  2. Seasonal and perennial allergic rhinitis - Testing today showed: grasses, indoor molds and outdoor molds - Copy of test results provided.  - Avoidance measures provided. - Continue with the Flonase two sprays per nostril daily. - Continue with nasal ipratropium one spray per nostril every 8 hours.  - Continue with Claritin to see if this works better.  - Information on allergen immunotherapy provided.  3. Recurrent infections - We will obtain some screening labs to evaluate your immune system.  - Labs to evaluate the quantitative [facility]) aspects of your immune system: IgG/IgA/IgM, CBC with differential - Labs to evaluate the qualitative ([place]) aspects of your immune system: CH50, Pneumococcal titers, Tetanus titers, Diphtheria titers - We may consider immunizations with Pneumovax and Tdap to challenge your immune  system, and then obtain repeat titers in 4-6 weeks.   4. Return in about 6 months (around 02/08/2020). This can be an in-person, a virtual Webex or a telephone follow up visit.   Subjective:   Kara Ramirez is a 68 y.o. female presenting today for follow up of  Chief Complaint  Patient presents with   Asthma    Kara Ramirez has a history of the following: Patient Active Problem List   Diagnosis Date Noted   Vaginal prolapse 12/17/2018   Jerking 09/22/2018   Closed head injury 09/11/2018   Mild intermittent asthma without complication [id number]   Multinodular thyroid, follow up US in 05/2019 06/10/2018   Fibromyalgia 05/26/2018   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 to 5 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 ([place]) 03/25/2018   Coccygeal pain 03/25/2018   Difficulty with speech 03/24/2018   Poor balance 03/24/2018   Hip pain 03/24/2018   SAH (subarachnoid hemorrhage) ([place]) 03/06/2018   Chest tightness 02/04/2018   Left lower lobe pulmonary nodule 12/10/2017   Paroxysmal atrial fibrillation ([place]) 10/30/2017   Shortness of breath 08/30/2017   Chronic sinusitis 03/14/2017   Severe scoliosis 12/11/2016   Prediabetes 06/06/2016   Cough 06/06/2016   Allergic rhinitis 02/08/2016   Osteoporosis 12/05/2015   Hyperlipidemia 05/27/2015   Internal hemorrhoids 08/23/2011   Constipation 08/23/2011    History obtained from: chart review and patient.  She was last seen in August 2020.  At that time, her lung testing looked great.  She did feel that Flovent worked better than Symbicort, so we continue with Flovent 2 puffs twice daily instead.  For her rhinitis, would continue with Flonase  and ipratropium.  Postnasal drip is always been an issue for her, and we gave her samples of RyVent to use as needed.  She also continue with Claritin.  She was interested in retesting to see if she has developed new sensitizations for possible immunotherapy, which is why she  presents today.  Kara Ramirez is a 68 year old female presenting for follow up and allergy testing. She has been doing okay since her last visit. For her allergies, she is currently taking Flonase, Nasal Ipratropium, Claritin, PRN saline from dryness and was given a trial of Ryvent to help with her postnasal, which she states did not seem to make much difference. She is here today for repeat allergy testing in preparation for allergy immunotherapy, which she had done in the past with improvement in her symptoms. She has not had her antihistamines for 3 days.  She is currently taking Flovent and as needed albuterol for her asthma. She continues to report cough and throat clearing presumably due to her ongoing postnasal drip. She states when she clears her mucus it is typically yellow-green.  She tends to switch her antihistamines around on a routine basis because she feels that she develops tolerance to them.  She has not needed antibiotics in quite some time.  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 She also has a history of recurrent infection. An immune screen was discussed at her last visit, but she opted to defer. She states she would like to wait until after her thyroid biopsy tomorrow and asks if her PCP could order these tests.  It should be noted that she has not needed antibiotics since January or February of this year.  Therefore, the frequency of her infection seems to be improving.  Otherwise, there have been no changes to her past medical history, surgical history, family history, or social history.    Review of Systems  Constitutional: Negative.  Negative for chills, fever, malaise/fatigue and weight loss.  HENT: Negative.  Negative for congestion, ear discharge, ear pain and sore throat.   Eyes: Negative for pain, discharge and redness.    Respiratory: Positive for cough and sputum production. Negative for shortness of breath and wheezing.   Cardiovascular: Negative.  Negative for chest pain and palpitations.  Gastrointestinal: Negative for abdominal pain, constipation, diarrhea, heartburn, nausea and vomiting.  Skin: Negative.  Negative for itching and rash.  Neurological: Negative for dizziness and headaches.  Endo/Heme/Allergies: Negative for environmental allergies. Does not bruise/bleed easily.       Objective:   Blood pressure 110/78, pulse 92, temperature 97.7 F (36.5 C), temperature source Temporal, resp. rate 16, height 5\' 4"  (1.626 m), weight 168 lb 3.2 oz (76.3 kg), SpO2 98 %. Body mass index is 28.87 kg/m.   Physical Exam:  Physical Exam  Constitutional: She appears well-developed.  Very talkative as usual.  HENT:  Head: Normocephalic and atraumatic.  Right Ear: Tympanic membrane, external ear and ear canal normal.  Left Ear: Tympanic membrane, external ear and ear canal normal.  Nose: Mucosal edema and rhinorrhea present. No nasal deformity or septal deviation. No epistaxis. Right sinus exhibits no maxillary sinus tenderness and no frontal sinus tenderness. Left sinus exhibits no maxillary sinus tenderness and no frontal sinus tenderness.  Mouth/Throat: Uvula is midline and oropharynx is clear and moist. Mucous membranes are not pale and not  dry.  Cobblestoning present in the posterior oropharynx.  Eyes: Pupils are equal, round, and reactive to light. Conjunctivae and EOM are normal. Right eye exhibits no chemosis and no discharge. Left eye exhibits no chemosis and no discharge. Right conjunctiva is not injected. Left conjunctiva is not injected.  Cardiovascular: Normal rate, regular rhythm and normal heart sounds.  Respiratory: Effort normal and breath sounds normal. No accessory muscle usage. No tachypnea. No respiratory distress. She has no wheezes. She has no rhonchi. She has no rales. She exhibits  no tenderness.  Moving air well in most lung fields.  Slightly decreased air movement at the bases, but no wheezes or crackles noted.  Lymphadenopathy:    She has no cervical adenopathy.  Neurological: She is alert.  Skin: No abrasion, no petechiae and no rash noted. Rash is not papular, not vesicular and not urticarial. No erythema. No pallor.  No eczematous or urticarial lesions noted.  Psychiatric: She has a normal mood and affect.     Diagnostic studies:    Spirometry: results normal (FEV1: 2.12/92%, FVC: 2.78/91%, FEV1/FVC: 76%).    Spirometry consistent with normal pattern.   Allergy Studies:    Airborne Adult Perc - 08/11/19 1000    Time Antigen Placed  1100    Allergen Manufacturer  Lavella Hammock    Location  Back    Number of Test  59    1. Control-Buffer 50% Glycerol  Negative    2. Control-Histamine 1 mg/ml  Negative    3. Albumin saline  Negative    4. [place]  Negative    5. [place]  Negative    6. [place]  Negative    7. [place]  --   +/-   8. Meadow Fescue  Negative    9. Perennial Rye  Negative    10. Sweet Vernal  Negative    11. Timothy  Negative    12. Cocklebur  Negative    13. Burweed [place]er  Negative    14. Ragweed, short  Negative    15. Ragweed, Giant  Negative    16. Plantain,  English  Negative    17. Lamb's Quarters  Negative    18. Sheep Sorrell  Negative    19. Rough Pigweed  Negative    20. Marsh Elder, Rough  Negative    21. Mugwort, Common  Negative    22. Ash mix  Negative    23. Birch mix  Negative    24. Beech American  Negative    25. Box, Elder  Negative    26. Cedar, red  Negative    27. Cottonwood, [place]  Negative    28. Elm mix  Negative    29. Hickory mix  Negative    30. Maple mix  Negative    31. Oak, [place] mix  Negative    32. Pecan Pollen  Negative    33. Pine mix  Negative    34. Sycamore Eastern  Negative    35. [place], Black Pollen  Negative    36. Alternaria alternata  Negative    37. Cladosporium  Herbarum  Negative    38. Aspergillus mix  Negative    39. Penicillium mix  Negative    40. Bipolaris sorokiniana (Helminthosporium)  Negative    41. Drechslera spicifera (Curvularia)  Negative    42. Mucor plumbeus  Negative    43. Fusarium moniliforme  Negative    44. Aureobasidium pullulans (pullulara)  Negative    45.  Rhizopus oryzae  Negative    46. Botrytis cinera  Negative    47. Epicoccum nigrum  Negative    48. Phoma betae  Negative    49. Candida Albicans  Negative    50. Trichophyton mentagrophytes  Negative    51. Mite, D Farinae  5,000 AU/ml  Negative    52. Mite, D Pteronyssinus  5,000 AU/ml  Negative    53. Cat Hair 10,000 BAU/ml  Negative    54.  Dog Epithelia  Negative    55. Mixed Feathers  Negative    56. Horse Epithelia  Negative    57. Cockroach, German  Negative    58. Mouse  Negative    59. Tobacco Leaf  Negative     Intradermal - 08/11/19 1154    Time Antigen Placed  1130    Allergen Manufacturer  Lavella Hammock    Location  Arm    Number of Test  14    Intradermal  Select    Control  Negative    [place]  3+    Johnson  3+    Ragweed mix  Negative    Weed mix  Negative    Tree mix  Negative    Mold 1  Negative    Mold 2  3+    Mold 3  1+    Mold 4  3+    Cat  Negative    Dog  Negative    Cockroach  Negative    Mite mix  Negative       Allergy testing results were read and interpreted by myself, documented by clinical staff.      Salvatore Marvel, MD  [facility]

## 2019-08-12 ENCOUNTER — Other Ambulatory Visit (HOSPITAL_COMMUNITY)
Admission: RE | Admit: 2019-08-12 | Discharge: 2019-08-12 | Disposition: A | Discharge status: Home / Self Care | Payer: PPO | Source: Ambulatory Visit | Attending: Radiology | Admitting: Radiology

## 2019-08-12 ENCOUNTER — Ambulatory Visit
Admission: RE | Admit: 2019-08-12 | Discharge: 2019-08-12 | Disposition: A | Discharge status: Home / Self Care | Payer: PPO | Source: Ambulatory Visit | Attending: Otolaryngology | Admitting: Otolaryngology

## 2019-08-12 DIAGNOSIS — E041 Nontoxic single thyroid nodule: Secondary | ICD-10-CM | POA: Insufficient documentation

## 2019-08-12 DIAGNOSIS — E042 Nontoxic multinodular goiter: Secondary | ICD-10-CM

## 2019-08-13 LAB — CYTOLOGY - NON PAP

## 2019-08-18 ENCOUNTER — Encounter: Payer: Self-pay | Admitting: Allergy & Immunology

## 2019-08-18 LAB — CBC WITH DIFFERENTIAL
Basophils Absolute: 0.1 10*3/uL (ref 0.0–0.2)
Basos: 1 %
EOS (ABSOLUTE): 0.1 10*3/uL (ref 0.0–0.4)
Eos: 1 %
Hematocrit: 41.1 % (ref 34.0–46.6)
Hemoglobin: 13.4 g/dL (ref 11.1–15.9)
Immature Grans (Abs): 0 10*3/uL (ref 0.0–0.1)
Immature Granulocytes: 0 %
Lymphocytes Absolute: 2 10*3/uL (ref 0.7–3.1)
Lymphs: 23 %
MCH: 27.9 pg (ref 26.6–33.0)
MCHC: 32.6 g/dL (ref 31.5–35.7)
MCV: 85 fL (ref 79–97)
Monocytes Absolute: 0.4 10*3/uL (ref 0.1–0.9)
Monocytes: 5 %
Neutrophils Absolute: 6 10*3/uL (ref 1.4–7.0)
Neutrophils: 70 %
RBC: 4.81 x10E6/uL (ref 3.77–5.28)
RDW: 13.6 % (ref 11.7–15.4)
WBC: 8.6 10*3/uL (ref 3.4–10.8)

## 2019-08-18 LAB — STREP PNEUMONIAE 23 SEROTYPES IGG
Pneumo Ab Type 1*: 9.6 ug/mL (ref >1.3)
Pneumo Ab Type 12 (12F)*: 2.2 ug/mL (ref >1.3)
Pneumo Ab Type 14*: 23.3 ug/mL (ref >1.3)
Pneumo Ab Type 17 (17F)*: 5.2 ug/mL (ref >1.3)
Pneumo Ab Type 19 (19F)*: 11.1 ug/mL (ref >1.3)
Pneumo Ab Type 2*: 1.5 ug/mL (ref >1.3)
Pneumo Ab Type 20*: 6.3 ug/mL (ref >1.3)
Pneumo Ab Type 22 (22F)*: 1.2 ug/mL — ABNORMAL LOW (ref >1.3)
Pneumo Ab Type 23 (23F)*: 18.2 ug/mL (ref >1.3)
Pneumo Ab Type 26 (6B)*: 8.4 ug/mL (ref >1.3)
Pneumo Ab Type 3*: 14 ug/mL (ref >1.3)
Pneumo Ab Type 34 (10A)*: 2.2 ug/mL (ref >1.3)
Pneumo Ab Type 4*: 2.5 ug/mL (ref >1.3)
Pneumo Ab Type 43 (11A)*: 11 ug/mL (ref >1.3)
Pneumo Ab Type 5*: 11.8 ug/mL (ref >1.3)
Pneumo Ab Type 51 (7F)*: 3.8 ug/mL (ref >1.3)
Pneumo Ab Type 54 (15B)*: 3.7 ug/mL (ref >1.3)
Pneumo Ab Type 56 (18C)*: 5 ug/mL (ref >1.3)
Pneumo Ab Type 57 (19A)*: 12.3 ug/mL (ref >1.3)
Pneumo Ab Type 68 (9V)*: 4.6 ug/mL (ref >1.3)
Pneumo Ab Type 70 (33F)*: 13.1 ug/mL (ref >1.3)
Pneumo Ab Type 8*: 1.6 ug/mL (ref >1.3)
Pneumo Ab Type 9 (9N)*: 6.4 ug/mL (ref >1.3)

## 2019-08-18 LAB — IGG, IGA, IGM
IgA/Immunoglobulin A, Serum: 145 mg/dL (ref 87–352)
IgG (Immunoglobin G), Serum: 1239 mg/dL (ref 586–1602)
IgM (Immunoglobulin M), Srm: 114 mg/dL (ref 26–217)

## 2019-08-18 LAB — DIPHTHERIA / TETANUS ANTIBODY PANEL
Diphtheria Ab: <0.1 IU/mL — ABNORMAL LOW (ref <0.10)
Tetanus Ab, IgG: 2.01 IU/mL (ref <0.10)

## 2019-08-18 LAB — COMPLEMENT, TOTAL: Compl, Total (CH50): >60 U/mL (ref >41)

## 2019-08-19 ENCOUNTER — Encounter: Payer: Self-pay | Admitting: Allergy & Immunology

## 2019-09-02 ENCOUNTER — Other Ambulatory Visit: Payer: PPO

## 2019-09-08 DIAGNOSIS — S069X9A Unspecified intracranial injury with loss of consciousness of unspecified duration, initial encounter: Secondary | ICD-10-CM | POA: Diagnosis not present

## 2019-09-08 DIAGNOSIS — H4912 Fourth [trochlear] nerve palsy, left eye: Secondary | ICD-10-CM | POA: Diagnosis not present

## 2019-09-08 DIAGNOSIS — H532 Diplopia: Secondary | ICD-10-CM | POA: Diagnosis not present

## 2019-09-08 DIAGNOSIS — H01009 Unspecified blepharitis unspecified eye, unspecified eyelid: Secondary | ICD-10-CM | POA: Diagnosis not present

## 2019-09-16 ENCOUNTER — Telehealth: Payer: Self-pay | Admitting: Allergy

## 2019-09-16 NOTE — Telephone Encounter (Signed)
Per Dr. Nelva Bush request she needs use Flovent 3 puff bid to help with her flare-ups and see if her sx improve. Patient will follow up on Friday with Dr. Nelva Bush on her visit.

## 2019-09-16 NOTE — Telephone Encounter (Signed)
Patient has seen both Ernst Bowler and [place]. She thinks she has a sinus infection. She says she has taken every medication she can and nothing is helping. I made an appt for Friday with Dr. Nelva Bush. She would like to know if there is anything she can take in the mean time, to help her breath.

## 2019-09-16 NOTE — Telephone Encounter (Signed)
Patient is a Dr. Ernst Bowler patient who is having a flare-up. LMOM for patient to call back.

## 2019-09-16 NOTE — Telephone Encounter (Signed)
Spoke with patient. Patient reports that she has had a headache and runny nose since Sunday when she visited [place], [place]. She reports that she had right ear pain as well that has subsided. She reports that she is coughing up green phlegm. She reports that she has been taking Claritin and Flonase daily with no relief. She is using Mucinex once daily with some relief. She uses Flovent daily as well. She is scheduled to see Dr. Nelva Bush on Friday. Patient advised to use her Mucinex as instructed on the bottle for continued relief and to increase her nasal saline rinses to twice daily until her appointment on Friday.

## 2019-09-16 NOTE — Telephone Encounter (Signed)
It looks like she should be on Flovent.  Is she taking her Flovent 2 puffs twice a day and using albuterol every 4-6 hours as needed for cough, wheeze, shortness of breath, chest tightness?    Agree with continued use of mucinex and saline rinses.   It appears she might need to have either prednisone and/or antibiotic after discussing symptoms at visit.

## 2019-09-18 ENCOUNTER — Encounter: Payer: Self-pay | Admitting: Allergy

## 2019-09-18 ENCOUNTER — Ambulatory Visit (INDEPENDENT_AMBULATORY_CARE_PROVIDER_SITE_OTHER): Payer: PPO | Admitting: Allergy

## 2019-09-18 ENCOUNTER — Other Ambulatory Visit: Payer: Self-pay

## 2019-09-18 VITALS — BP 108/78 | HR 80 | Temp 97.4°F | Resp 14

## 2019-09-18 DIAGNOSIS — J0181 Other acute recurrent sinusitis: Secondary | ICD-10-CM | POA: Diagnosis not present

## 2019-09-18 DIAGNOSIS — B999 Unspecified infectious disease: Secondary | ICD-10-CM | POA: Diagnosis not present

## 2019-09-18 DIAGNOSIS — J3089 Other allergic rhinitis: Secondary | ICD-10-CM | POA: Diagnosis not present

## 2019-09-18 DIAGNOSIS — J302 Other seasonal allergic rhinitis: Secondary | ICD-10-CM | POA: Diagnosis not present

## 2019-09-18 DIAGNOSIS — J4530 Mild persistent asthma, uncomplicated: Secondary | ICD-10-CM | POA: Diagnosis not present

## 2019-09-18 MED ORDER — DOXYCYCLINE HYCLATE 100 MG PO TBEC: DELAYED_RELEASE_TABLET | ORAL | 0 refills | Status: DC

## 2019-09-18 NOTE — Patient Instructions (Addendum)
1. Mild persistent asthma - Daily controller medication(s): Flovent 145mcg 2 puffs twice daily with spacer - Prior to physical activity: albuterol 2 puffs 10-15 minutes before physical activity. - Rescue medications: albuterol 4 puffs every 4-6 hours as needed - Asthma control goals:  * Full participation in all desired activities (may need albuterol before activity) * Albuterol use two time or less a week on average (not counting use with activity) * Cough interfering with sleep two time or less a month * Oral steroids no more than once a year * No hospitalizations  2. Seasonal and perennial allergic rhinitis - continue avoidance of  grasses, indoor molds and outdoor molds - Continue Flonase two sprays per nostril daily. - Continue ipratropium one spray per nostril every 8 hours if having nasal drainage.  - Continue with Claritin    3. Recurrent infections with current sinusitis - immunocompetence screen done at last visit in 07/2019 reassuring.  - for current sinusitis take Doxycyline 100mg  twice a day for 10 days - take prednisone 5 day course as directed - continue use of saline spray and Mucinex as needed to help mobilize/thin mucus - monitor for fevers  Follow-up 4-6 months or sooner if needed

## 2019-09-18 NOTE — Progress Notes (Signed)
Follow-up Note  RE: Kara Ramirez MRN: [id number] DOB: Apr 16, 1951 Date of Office Visit: 09/18/2019   History of present illness: Kara Ramirez is a 68 y.o. female presenting today for sick visit.  She was last seen in the office on August 11, 2019 by Dr. Ernst Bowler.  She has history of asthma, allergic rhinitis and recurrent infections.  She reports having nasal congestion and sinus pressure and frontal HA and pain of her upper teeth.  She has a yellow-green sputum with coughing.  Symptoms ongoing since last weekend.  She has tried Mucinex sparingly which has helped with the drainage.  She has saline spray that she is using.  She continues on to take Claritin and does have her nasal ipratropium spray that she has been using as well. She denies any fevers and has not had any COVID exposures.   She used her albuterol inhaler earlier in the week.  She is using Flovent twice a day with her spacer.  Review of systems: Review of Systems  Constitutional: Negative for chills, fever and malaise/fatigue.  HENT: Positive for congestion and sinus pain. Negative for ear discharge, ear pain, nosebleeds and sore throat.   Eyes: Negative for pain, discharge and redness.  Respiratory: Positive for cough and sputum production. Negative for shortness of breath and wheezing.   Cardiovascular: Negative.   Gastrointestinal: Negative.   Musculoskeletal: Negative.   Skin: Negative for itching and rash.  Neurological: Positive for headaches.    All other systems negative unless noted above in HPI  Past medical/social/surgical/family history have been reviewed and are unchanged unless specifically indicated below.  No changes  Medication List: Current Outpatient Medications  Medication Sig Dispense Refill  . acetaminophen (TYLENOL) 325 MG tablet Take 1-2 tablets (325-650 mg total) by mouth every 4 (four) hours as needed for mild pain. (Patient taking differently: Take 325 mg by mouth every 4  (four) hours as needed for mild pain or headache. )    . albuterol (PROAIR HFA) 108 (90 Base) MCG/ACT inhaler INHALE 2 PUFFS INTO THE LUNGS EVERY 4 HOURS AS NEEDED FOR COUGH OR WHEEZE. MAY USE 2 PUFFS 10 TO 20 MINUTES PRIOR TO EXERCISE 8.5 each 0  . aspirin-acetaminophen-caffeine (EXCEDRIN MIGRAINE) 250-250-65 MG tablet Take 1 tablet by mouth every 6 (six) hours as needed for headache.    . benzonatate (TESSALON PERLES) 100 MG capsule Take 1 capsule (100 mg total) by mouth 3 (three) times daily as needed. 45 capsule 0  . Calcium Carbonate-Vitamin D (CALCIUM 600+D) 600-400 MG-UNIT per tablet Take 1 tablet by mouth daily.     . Carboxymethylcellul-Glycerin (REFRESH OPTIVE) 1-0.9 % GEL Place 1 application into both eyes at bedtime.     . cholecalciferol (VITAMIN D) 1000 UNITS tablet Take 1,000 Units by mouth daily.      [name] Co-Enzyme Q-10 100 MG CAPS Take 100 mg by mouth daily.     . fluocinonide ointment (LIDEX) [id number] % Apply 1 application topically See admin instructions. Apply topically twice daily for 5 days alternating with Tacrolimus ointment    . fluticasone (FLONASE) 50 MCG/ACT nasal spray USE 1 SPRAY IN EACH NOSTRIL MID-DAY FOR CONGESTION (Patient taking differently: Place 1 spray into both nostrils daily. ) 16 g 4  . fluticasone (FLOVENT HFA) 110 MCG/ACT inhaler Inhale 2 puffs into the lungs 2 (two) times daily. 1 Inhaler 1  . ibandronate (BONIVA) 150 MG tablet Take 150 mg by mouth every 30 (thirty) days. Take in the morning with a  full glass of water, on an empty stomach, and do not take anything else by mouth or lie down for the next 30 min.    [name] ipratropium (ATROVENT) 0.03 % nasal spray Place 1 spray into both nostrils every 6 (six) hours as needed for rhinitis. 30 mL 2  . ketotifen (ZADITOR) 0.025 % ophthalmic solution Place 1 drop into both eyes 2 (two) times daily as needed (for dry eyes).     [name] loratadine (CLARITIN) 10 MG tablet Take 10 mg by mouth daily.    [name] MALIC ACID PO Take 1 capsule  by mouth at bedtime.    . metoprolol tartrate (LOPRESSOR) 25 MG tablet TAKE 1/2 TABLET BY MOUTH TWICE A DAY 90 tablet 0  . mirabegron ER (MYRBETRIQ) 50 MG TB24 tablet Take 50 mg by mouth daily.    . naproxen sodium (ALEVE) 220 MG tablet Take 220 mg by mouth daily as needed.     . Olopatadine HCl 0.2 % SOLN Apply to eye 2 (two) times daily as needed.    . Omega-3 Fatty Acids (FISH OIL) 1000 MG CAPS Take 1,000 mg by mouth at bedtime.     . polyethylene glycol (MIRALAX) 17 g packet Take 17 g by mouth daily as needed. Increase number of doses as needed 14 each 0  . pravastatin (PRAVACHOL) 10 MG tablet Take 1 tablet by mouth 4 days a week. 48 tablet 1  . senna (SENOKOT) 8.6 MG tablet Take 1 tablet by mouth daily.    . sodium chloride (OCEAN) 0.65 % SOLN nasal spray Place 1 spray into both nostrils as needed for congestion. (Patient taking differently: Place 1 spray into both nostrils 4 (four) times daily as needed for congestion. )  0  . tacrolimus (PROTOPIC) 0.1 % ointment Apply 1 application topically 2 (two) times daily as needed (PRN skin issues). (Patient taking differently: Apply 1 application topically See admin instructions. Apply topically twice daily for 5 days alternating with Fluocinonide ointment) 100 g 0  . Thiamine HCl (VITAMIN B-1) 100 MG tablet Take 100 mg by mouth daily.      [name] doxycycline (DORYX) 100 MG EC tablet Take 1 tablet by mouth twice daily for 10 days 20 tablet 0   No current facility-administered medications for this visit.      Known medication allergies: Allergies  Allergen Reactions  . Mold Extract [Trichophyton Mentagrophyte] Shortness Of Breath and Rash  . Penicillins Shortness Of Breath, Rash and Other (See Comments)    Eyes puffy Has taken low dose pcn and no rx REACTION: rash, SOB Has patient had a PCN reaction causing immediate rash, facial/tongue/throat swelling, SOB or lightheadedness with hypotension: yes Has patient had a PCN reaction causing severe rash  involving mucus membranes or skin necrosis: unk Has patient had a PCN reaction that required hospitalization: no Has patient had a PCN reaction occurring within the last 10 years: unk If all of the above answers are "NO", then may proceed with Cephalospor  . Morphine Other (See Comments)    REACTION: tachycardia and anxiety  . Peanut Oil Nausea And Vomiting    Peanut butter  . Protonix [Pantoprazole Sodium] Nausea And Vomiting  . Citrus Other (See Comments)    Unknown  . Peanut-Containing Drug Products Other (See Comments)    Unknown  . Tramadol Other (See Comments)    Makes crazy;confused  . Valium [Diazepam] Other (See Comments)    Confusion per family  . Cetirizine Rash and Other (See Comments)  Around face  . Codeine Other (See Comments)    REACTION: dizzy and "groggy in my head"  . Eggs Or Egg-Derived Products Nausea And Vomiting  . Latex Itching  . Pentazocine Lactate Palpitations     Physical examination: Blood pressure 108/78, pulse 80, temperature (!) 97.4 F (36.3 C), temperature source Temporal, resp. rate 14, SpO2 97 %.  General: Alert, interactive, in no acute distress. HEENT: PERRLA, TMs pearly gray, turbinates moderately edematous with clear discharge, post-pharynx non erythematous. Neck: Supple without lymphadenopathy. Lungs: Clear to auscultation without wheezing, rhonchi or rales. {no increased work of breathing. CV: Normal S1, S2 without murmurs. Abdomen: Nondistended, nontender. Skin: Warm and dry, without lesions or rashes. Extremities:  No clubbing, cyanosis or edema. Neuro:   Grossly intact.  Diagnositics/Labs: None today  Assessment and plan:   1. Mild persistent asthma - Daily controller medication(s): Flovent 119mcg 2 puffs twice daily with spacer - Prior to physical activity: albuterol 2 puffs 10-15 minutes before physical activity. - Rescue medications: albuterol 4 puffs every 4-6 hours as needed - Asthma control goals:  * Full  participation in all desired activities (may need albuterol before activity) * Albuterol use two time or less a week on average (not counting use with activity) * Cough interfering with sleep two time or less a month * Oral steroids no more than once a year * No hospitalizations  2. Seasonal and perennial allergic rhinitis - continue avoidance of  grasses, indoor molds and outdoor molds - Continue Flonase two sprays per nostril daily. - Continue ipratropium one spray per nostril every 8 hours if having nasal drainage.  - Continue with Claritin    3. Recurrent infections with current sinusitis - immunocompetence screen done at last visit in 07/2019 reassuring.  - for current sinusitis take Doxycyline 100mg  twice a day for 10 days - take prednisone 5 day course as directed - continue use of saline spray and Mucinex as needed to help mobilize/thin mucus - monitor for fevers  Follow-up 4-6 months or sooner if needed  I appreciate the opportunity to take part in Jocelin's care. Please do not hesitate to contact me with questions.  Sincerely,   Prudy Feeler, MD Allergy/Immunology [facility]

## 2019-10-01 DIAGNOSIS — M25512 Pain in left shoulder: Secondary | ICD-10-CM | POA: Diagnosis not present
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
## 2019-10-21 ENCOUNTER — Encounter: Payer: Self-pay | Admitting: Neurology

## 2019-10-21 ENCOUNTER — Ambulatory Visit (INDEPENDENT_AMBULATORY_CARE_PROVIDER_SITE_OTHER): Payer: PPO | Admitting: Neurology

## 2019-10-21 ENCOUNTER — Other Ambulatory Visit: Payer: Self-pay

## 2019-10-21 VITALS — BP 110/77 | HR 97 | Resp 18 | Ht 66.5 in | Wt 167.2 lb

## 2019-10-21 DIAGNOSIS — R259 Unspecified abnormal involuntary movements: Secondary | ICD-10-CM | POA: Diagnosis not present

## 2019-10-21 DIAGNOSIS — R251 Tremor, unspecified: Secondary | ICD-10-CM

## 2019-10-21 NOTE — Patient Instructions (Signed)
Please video tape the movements if available at next occurrence.

## 2019-10-28 ENCOUNTER — Other Ambulatory Visit: Payer: PPO

## 2019-10-29 ENCOUNTER — Ambulatory Visit (INDEPENDENT_AMBULATORY_CARE_PROVIDER_SITE_OTHER): Payer: PPO | Admitting: Neurology

## 2019-10-29 ENCOUNTER — Other Ambulatory Visit: Payer: Self-pay

## 2019-10-29 DIAGNOSIS — R251 Tremor, unspecified: Secondary | ICD-10-CM

## 2019-10-29 DIAGNOSIS — R9401 Abnormal electroencephalogram [EEG]: Secondary | ICD-10-CM | POA: Diagnosis not present

## 2019-10-29 NOTE — Procedures (Signed)
TECHNICAL SUMMARY:  A multichannel referential and bipolar montage EEG using the standard international 10-20 system was performed on the patient described as awake and drowsy.  The dominant background activity consists of 8-8.5 hertz activity seen most prominantly over the posterior head region.  The backgound activity is reactive to eye opening and closing procedures.  Occasional 7 Hz theta can be seen admixed, especially in the posterior head regions.  Low voltage fast (beta) activity is distributed symmetrically and maximally over the anterior head regions.  ACTIVATION:  Stepwise photic stimulation at 4-20 flashes per second was performed and did not elicit any abnormal waveforms but did produce a symmetric driving response.  Hyperventilation was not performed.  EPILEPTIFORM ACTIVITY:  There were no spikes, sharp waves or paroxysmal activity.  SLEEP: Physiologic drowsiness is noted.   IMPRESSION:  This is an abnormal EEG demonstrating a very mild diffuse slowing of electrocerebral activity.  This can be seen in a wide variety of encephalopathic state including those of a toxic, metabolic, or degenerative nature.  This can also be due to patient drowsiness.  There were no focal, hemispheric, or lateralizing features.  No epileptiform activity was recorded.

## 2019-11-09 ENCOUNTER — Ambulatory Visit (INDEPENDENT_AMBULATORY_CARE_PROVIDER_SITE_OTHER): Payer: PPO | Admitting: Neurology

## 2019-11-09 ENCOUNTER — Other Ambulatory Visit: Payer: Self-pay

## 2019-11-09 DIAGNOSIS — Z01419 Encounter for gynecological examination (general) (routine) without abnormal findings: Secondary | ICD-10-CM | POA: Diagnosis not present

## 2019-11-09 DIAGNOSIS — R251 Tremor, unspecified: Secondary | ICD-10-CM | POA: Diagnosis not present

## 2019-11-09 DIAGNOSIS — Z6827 Body mass index (BMI) 27.0-27.9, adult: Secondary | ICD-10-CM | POA: Diagnosis not present

## 2019-11-09 DIAGNOSIS — Z1231 Encounter for screening mammogram for malignant neoplasm of breast: Secondary | ICD-10-CM | POA: Diagnosis not present

## 2019-11-09 DIAGNOSIS — M810 Age-related osteoporosis without current pathological fracture: Secondary | ICD-10-CM | POA: Diagnosis not present

## 2019-11-09 LAB — HM MAMMOGRAPHY

## 2019-11-16 NOTE — Procedures (Signed)
ELECTROENCEPHALOGRAM REPORT  Dates of Recording: 11/09/2019 1:31PM to 11/10/2019 1:39PM  Patient's Name: Kara Ramirez MRN: [id number] Date of Birth: 1951-05-15  Referring Provider: Dr. Wells Guiles Tat  Procedure: 24-hour ambulatory video EEG  History: This is a 68 year old woman with abnormal movements of the left arm.  Medications:  TYLENOL 325 MG tablet PROAIR HFA 108 (90 Base) MCG/ACT inhaler EXCEDRIN MIGRAINE 250-250-65 MG tablet CALCIUM 600+D 600-400 MG-UNIT REFRESH OPTIVE 1-0.9 % GEL VITAMIN D 1000 UNITS tablet Co-Enzyme Q-10 100 MG CAPS LIDEX 0.05 % FLONASE 50 MCG/ACT nasal spray FLOVENT HFA 110 MCG/ACT inhaler fluocinonide ointment BONIVA 150 MG tablet ATROVENT 0.03 % nasal spray ZADITOR 0.025 % ophthalmic solution CLARITIN 10 MG tablet MALIC ACID PO LOPRESSOR 25 MG tablet MYRBETRIQ 50 MG TB24 tablet ALEVE 220 MG tablet Olopatadine HCl 0.2 % SOLN FISH OIL 1000 MG CAPS MIRALAX 17 g packet PRAVACHOL 10 MG tablet SENOKOT 8.6 MG tablet OCEAN 0.65 % SOLN nasal spray PROTOPIC 0.1 % ointment VITAMIN B-1 100 MG tablet TESSALON PERLES 100 MG capsule   Technical Summary: This is a 24-hour multichannel digital video EEG recording measured by the international 10-20 system with electrodes applied with paste and impedances below 5000 ohms performed as portable with EKG monitoring.  The digital EEG was referentially recorded, reformatted, and digitally filtered in a variety of bipolar and referential montages for optimal display.    DESCRIPTION OF RECORDING: During maximal wakefulness, the background activity consisted of a symmetric 8 Hz posterior dominant rhythm which was reactive to eye opening.  There were no epileptiform discharges or focal slowing seen in wakefulness.  During the recording, the patient progresses through wakefulness, drowsiness, and Stage 2 sleep. During drowsiness and sleep, there is an increase in theta and delta slowing of the background, with  shifting asymmetry seen over the bilateral temporal regions.  Again, there were no epileptiform discharges seen.  Events: On 12/2 at 1128 hours, she pushes the button for arm-shoulder jerking. On the video, there is irregular left arm jerking movement seen. Electrographically, there were no EEG or EKG changes seen.  There were no electrographic seizures seen.  EKG lead was unremarkable.  IMPRESSION: This 24-hour ambulatory video EEG study is normal. Episode of irregular left arm jerking did not show any epileptiform correlate.   CLINICAL CORRELATION: A normal EEG does not exclude a clinical diagnosis of epilepsy.  If further clinical questions remain, inpatient video EEG monitoring may be helpful.   Ellouise Newer, M.D.

## 2019-11-23 ENCOUNTER — Ambulatory Visit: Payer: PPO | Admitting: Neurology

## 2019-11-23 ENCOUNTER — Telehealth: Payer: Self-pay | Admitting: Neurology

## 2019-11-23 NOTE — Telephone Encounter (Signed)
Let pt know that I reviewed the EEG video.  If anything the movements looked myoclonic.  There weren't a lot of them for me to look at.  I would like to do MRI cervical spine to make sure everything there looks okay.  If so, we may just try some medication.  Please order MRI cervical spine without gad (unless she had prior surgery on neck , and then order with gad).  Dx:  Propriospinal myoclonus

## 2019-11-25 ENCOUNTER — Telehealth: Payer: Self-pay | Admitting: Neurology

## 2019-11-25 DIAGNOSIS — S069X1D Unspecified intracranial injury with loss of consciousness of 30 minutes or less, subsequent encounter: Secondary | ICD-10-CM | POA: Diagnosis not present

## 2019-11-25 DIAGNOSIS — G253 Myoclonus: Secondary | ICD-10-CM

## 2019-11-25 DIAGNOSIS — Z6825 Body mass index (BMI) 25.0-25.9, adult: Secondary | ICD-10-CM | POA: Diagnosis not present

## 2019-11-25 NOTE — Telephone Encounter (Signed)
Per patient non neck surgery. She is unsure that she wants to have the MRI. She had a MRI of the Brain that took a "long time" and she is not sure that she wants to go through anymore scans

## 2019-11-25 NOTE — Telephone Encounter (Signed)
Patient called and said she's decided to have the MRI after all. Please see signed telephone note from 11/23/2019 for reference. She'd like a call back from a nurse.

## 2019-11-25 NOTE — Telephone Encounter (Signed)
Okay.  She can let me know if she changes her mind.  We can still try some medication if she wants or wait to think about the MRI

## 2019-11-25 NOTE — Telephone Encounter (Signed)
Will wait on the MRI decision

## 2019-11-26 NOTE — Telephone Encounter (Signed)
Ordered and patient is aware. She has no other concerns at this time she is calling [facility] to discuss the open/closed of the machine

## 2019-12-01 ENCOUNTER — Ambulatory Visit
Admission: RE | Admit: 2019-12-01 | Discharge: 2019-12-01 | Disposition: A | Discharge status: Home / Self Care | Payer: PPO | Source: Ambulatory Visit | Attending: Neurology | Admitting: Neurology

## 2019-12-01 DIAGNOSIS — G253 Myoclonus: Secondary | ICD-10-CM

## 2019-12-01 DIAGNOSIS — M4802 Spinal stenosis, cervical region: Secondary | ICD-10-CM | POA: Diagnosis not present

## 2019-12-02 ENCOUNTER — Encounter: Payer: Self-pay | Admitting: Allergy

## 2019-12-02 ENCOUNTER — Other Ambulatory Visit: Payer: Self-pay

## 2019-12-02 ENCOUNTER — Telehealth: Payer: Self-pay | Admitting: Neurology

## 2019-12-02 ENCOUNTER — Ambulatory Visit (INDEPENDENT_AMBULATORY_CARE_PROVIDER_SITE_OTHER): Payer: PPO | Admitting: Allergy

## 2019-12-02 DIAGNOSIS — J302 Other seasonal allergic rhinitis: Secondary | ICD-10-CM

## 2019-12-02 DIAGNOSIS — J3089 Other allergic rhinitis: Secondary | ICD-10-CM

## 2019-12-02 DIAGNOSIS — J0181 Other acute recurrent sinusitis: Secondary | ICD-10-CM | POA: Diagnosis not present

## 2019-12-02 DIAGNOSIS — J4530 Mild persistent asthma, uncomplicated: Secondary | ICD-10-CM

## 2019-12-02 MED ORDER — PREDNISONE 20 MG PO TABS: 20.0000 mg | ORAL_TABLET | Freq: Every day | ORAL | 0 refills | Status: AC

## 2019-12-02 MED ORDER — DOXYCYCLINE HYCLATE 100 MG PO CAPS: 100.0000 mg | ORAL_CAPSULE | Freq: Two times a day (BID) | ORAL | 0 refills | Status: AC

## 2019-12-02 MED ORDER — DOXYCYCLINE HYCLATE 100 MG PO TBEC: 100.0000 mg | DELAYED_RELEASE_TABLET | Freq: Two times a day (BID) | ORAL | 0 refills | Status: DC

## 2019-12-02 NOTE — Progress Notes (Signed)
RE: Kara Ramirez MRN: [id number] DOB: December 27, 1950 Date of Telemedicine Visit: 12/02/2019  Referring provider: Binnie Rail, MD Primary care provider: Binnie Rail, MD  Chief Complaint: Asthma, Cough (all the time), and Allergic Rhinitis  (post nasal drip 2+ weeks)   Telemedicine Follow Up Visit via Telephone: I connected with Kara Ramirez for a follow up on 12/02/19 by telephone and verified that I am speaking with the correct person using two identifiers.   I discussed the limitations, risks, security and privacy concerns of performing an evaluation and management service by telephone and the availability of in person appointments. I also discussed with the patient that there may be a patient responsible charge related to this service. The patient expressed understanding and agreed to proceed.  Patient is at home.  Provider is at the office.  Visit start time: [id number] Visit end time: [place] consent/check in by: [place] consent and medical assistant/nurse: Sheliah Plane  History of Present Illness: She is a 69 y.o. female, who is being followed for asthma, allergic rhinitis and recurrent sinusitis.  Visit today is a sick visit.   Her previous allergy office visit was on 09/18/2019 with Dr. Nelva Bush.   She states she has been having symptoms since Christmas and symptom are worening. Reports symptoms including HA with sinus pressure and pain, nasal congestion and drainage that is green and very thick, cough.  Symptoms are similar to previous sinus infections she has had.  She has been checking temp and has been afebrile.  She states she mostly is home unless going to grocery store about once a week.  She did have an MRI of spine performed yesterday due to jerky movements of her arms.  She has been using mucinex but feels it dries her up too much.  She continues on her routine medications including medium dose Flovent, as needed albuterol, flonase and ipratopium nasal  sprays and Claritin.    Assessment and Plan: Kara Ramirez is a 69 y.o. female with:   1. Mild persistent asthma - Daily controller medication(s): Flovent 150mcg 2 puffs twice daily with spacer - Prior to physical activity: albuterol 2 puffs 10-15 minutes before physical activity. - Rescue medications: albuterol 4 puffs every 4-6 hours as needed - Asthma control goals:  * Full participation in all desired activities (may need albuterol before activity) * Albuterol use two time or less a week on average (not counting use with activity) * Cough interfering with sleep two time or less a month * Oral steroids no more than once a year * No hospitalizations  2. Seasonal and perennial allergic rhinitis - continue avoidance of  grasses, indoor molds and outdoor molds - Continue Flonase two sprays per nostril daily. - Continue ipratropium one spray per nostril every 8 hours if having nasal drainage.  - Continue with Claritin    3. Recurrent infections with current sinusitis - immunocompetence screen done in 07/2019 was reassuring.  - for current sinusitis take Doxycyline 100mg  twice a day for 10 days - take prednisone 5 day course as directed - continue use of saline spray and Mucinex as needed to help mobilize/thin mucus - monitor for fevers - at this time symptoms appear most consisent with acute sinusitis and given history.  Do not believe she has COVID given duration and description of symptoms.  However would recommend while she is sick to remain at home until symptoms resolve.  Follow-up 4-6 months or sooner if needed  Diagnostics: None.  Medication List:  Current Outpatient Medications  Medication Sig Dispense Refill  . acetaminophen (TYLENOL) 325 MG tablet Take 1-2 tablets (325-650 mg total) by mouth every 4 (four) hours as needed for mild pain. (Patient taking differently: Take 325 mg by mouth every 4 (four) hours as needed for mild pain or headache. )    . albuterol (PROAIR HFA) 108  (90 Base) MCG/ACT inhaler INHALE 2 PUFFS INTO THE LUNGS EVERY 4 HOURS AS NEEDED FOR COUGH OR WHEEZE. MAY USE 2 PUFFS 10 TO 20 MINUTES PRIOR TO EXERCISE 8.5 each 0  . aspirin-acetaminophen-caffeine (EXCEDRIN MIGRAINE) 250-250-65 MG tablet Take 1 tablet by mouth every 6 (six) hours as needed for headache.    . Calcium Carbonate-Vitamin D (CALCIUM 600+D) 600-400 MG-UNIT per tablet Take 1 tablet by mouth daily.     . Carboxymethylcellul-Glycerin (REFRESH OPTIVE) 1-0.9 % GEL Place 1 application into both eyes at bedtime.     . cholecalciferol (VITAMIN D) 1000 UNITS tablet Take 1,000 Units by mouth daily.      [name] Co-Enzyme Q-10 100 MG CAPS Take 100 mg by mouth daily.     . fluocinonide ointment (LIDEX) AB-123456789 % Apply 1 application topically See admin instructions. Apply topically twice daily for 5 days alternating with Tacrolimus ointment    . fluticasone (FLONASE) 50 MCG/ACT nasal spray USE 1 SPRAY IN EACH NOSTRIL MID-DAY FOR CONGESTION (Patient taking differently: Place 1 spray into both nostrils daily. ) 16 g 4  . fluticasone (FLOVENT HFA) 110 MCG/ACT inhaler Inhale 2 puffs into the lungs 2 (two) times daily. 1 Inhaler 1  . ibandronate (BONIVA) 150 MG tablet Take 150 mg by mouth every 30 (thirty) days. Take in the morning with a full glass of water, on an empty stomach, and do not take anything else by mouth or lie down for the next 30 min.    [name] ipratropium (ATROVENT) 0.03 % nasal spray Place 1 spray into both nostrils every 6 (six) hours as needed for rhinitis. 30 mL 2  . ketotifen (ZADITOR) 0.025 % ophthalmic solution Place 1 drop into both eyes 2 (two) times daily as needed (for dry eyes).     [name] loratadine (CLARITIN) 10 MG tablet Take 10 mg by mouth daily.    [name] MALIC ACID PO Take 1 capsule by mouth at bedtime.    . metoprolol tartrate (LOPRESSOR) 25 MG tablet TAKE 1/2 TABLET BY MOUTH TWICE A DAY 90 tablet 0  . mirabegron ER (MYRBETRIQ) 50 MG TB24 tablet Take 50 mg by mouth daily.    . naproxen sodium  (ALEVE) 220 MG tablet Take 220 mg by mouth daily as needed.     . Olopatadine HCl 0.2 % SOLN Apply to eye 2 (two) times daily as needed.    . Omega-3 Fatty Acids (FISH OIL) 1000 MG CAPS Take 1,000 mg by mouth at bedtime.     . polyethylene glycol (MIRALAX) 17 g packet Take 17 g by mouth daily as needed. Increase number of doses as needed 14 each 0  . pravastatin (PRAVACHOL) 10 MG tablet Take 1 tablet by mouth 4 days a week. 48 tablet 1  . senna (SENOKOT) 8.6 MG tablet Take 1 tablet by mouth daily.    . sodium chloride (OCEAN) 0.65 % SOLN nasal spray Place 1 spray into both nostrils as needed for congestion. (Patient taking differently: Place 1 spray into both nostrils 4 (four) times daily as needed for congestion. )  0  . tacrolimus (PROTOPIC) 0.1 % ointment  Apply 1 application topically 2 (two) times daily as needed (PRN skin issues). (Patient taking differently: Apply 1 application topically See admin instructions. Apply topically twice daily for 5 days alternating with Fluocinonide ointment) 100 g 0  . Thiamine HCl (VITAMIN B-1) 100 MG tablet Take 100 mg by mouth daily.      . benzonatate (TESSALON PERLES) 100 MG capsule Take 1 capsule (100 mg total) by mouth 3 (three) times daily as needed. (Patient not taking: Reported on 10/21/2019) 45 capsule 0  . doxycycline (VIBRAMYCIN) 100 MG capsule Take 1 capsule (100 mg total) by mouth 2 (two) times daily for 10 days. 20 capsule 0  . predniSONE (DELTASONE) 20 MG tablet Take 1 tablet (20 mg total) by mouth daily with breakfast for 5 days. 5 tablet 0   No current facility-administered medications for this visit.   Allergies: Allergies  Allergen Reactions  . Mold Extract [Trichophyton Mentagrophyte] Shortness Of Breath and Rash  . Penicillins Shortness Of Breath, Rash and Other (See Comments)    Eyes puffy Has taken low dose pcn and no rx REACTION: rash, SOB Has patient had a PCN reaction causing immediate rash, facial/tongue/throat swelling, SOB or  lightheadedness with hypotension: yes Has patient had a PCN reaction causing severe rash involving mucus membranes or skin necrosis: unk Has patient had a PCN reaction that required hospitalization: no Has patient had a PCN reaction occurring within the last 10 years: unk If all of the above answers are "NO", then may proceed with Cephalospor  . Morphine Other (See Comments)    REACTION: tachycardia and anxiety  . Peanut Oil Nausea And Vomiting    Peanut butter  . Protonix [Pantoprazole Sodium] Nausea And Vomiting  . Citrus Other (See Comments)    Unknown  . Peanut-Containing Drug Products Other (See Comments)    Unknown  . Tramadol Other (See Comments)    Makes crazy;confused  . Valium [Diazepam] Other (See Comments)    Confusion per family  . Cetirizine Rash and Other (See Comments)    Around face  . Codeine Other (See Comments)    REACTION: dizzy and "groggy in my head"  . Eggs Or Egg-Derived Products Nausea And Vomiting  . Latex Itching  . Pentazocine Lactate Palpitations   I reviewed her past medical history, social history, family history, and environmental history and no significant changes have been reported from previous visit on 09/18/2019.  Review of Systems  Constitutional: Negative for chills, diaphoresis and fever.  HENT: Positive for congestion, postnasal drip, rhinorrhea, sinus pressure and sinus pain.   Eyes: Negative.   Respiratory: Positive for cough.   Cardiovascular: Negative.   Gastrointestinal: Negative.   Skin: Negative.   Neurological: Positive for headaches.  Otherwise ROS negative unless noted above or in HPI  Objective: Physical Exam Not obtained as encounter was done via telephone.   Previous notes and tests were reviewed.  I discussed the assessment and treatment plan with the patient. The patient was provided an opportunity to ask questions and all were answered. The patient agreed with the plan and demonstrated an understanding of the  instructions.   The patient was advised to call back or seek an in-person evaluation if the symptoms worsen or if the condition fails to improve as anticipated.  I provided 20 minutes of non-face-to-face time during this encounter.  It was my pleasure to participate in [facility] care today. Please feel free to contact me with any questions or concerns.   Sincerely,  Roxie Gueye  Charmian Muff, MD

## 2019-12-02 NOTE — Telephone Encounter (Signed)
Please put her on 1/28 to discuss.

## 2019-12-02 NOTE — Telephone Encounter (Signed)
Reviewed MRI c-spine.  Agree with report.  Please call patient and let her know MRI c-spine generally looked good and didn't show anything that would cause movements.  At this point, does she wish to try med to see if it would help movement?

## 2019-12-02 NOTE — Telephone Encounter (Signed)
Spoke with pt informed her that MRI looked good, asked if she wanted to try some medication for her movements? Pt asked what medication? Asked side effects? Would like to look up information on any medications you would like to start. Pt also stated that she has tried Lyrica and that it made her want to kill herself and made her think of wanting to kill other people and that she had also tried Gabapentin and it didn't work it made her jerk even more,

## 2019-12-02 NOTE — Patient Instructions (Signed)
1. Mild persistent asthma - Daily controller medication(s): Flovent 189mcg 2 puffs twice daily with spacer - Prior to physical activity: albuterol 2 puffs 10-15 minutes before physical activity. - Rescue medications: albuterol 4 puffs every 4-6 hours as needed - Asthma control goals:  * Full participation in all desired activities (may need albuterol before activity) * Albuterol use two time or less a week on average (not counting use with activity) * Cough interfering with sleep two time or less a month * Oral steroids no more than once a year * No hospitalizations  2. Seasonal and perennial allergic rhinitis - continue avoidance of  grasses, indoor molds and outdoor molds - Continue Flonase two sprays per nostril daily. - Continue ipratropium one spray per nostril every 8 hours if having nasal drainage.  - Continue with Claritin    3. Recurrent infections with current sinusitis - immunocompetence screen done in 07/2019 was reassuring.  - for current sinusitis take Doxycyline 100mg  twice a day for 10 days - take prednisone 5 day course as directed - continue use of saline spray and Mucinex as needed to help mobilize/thin mucus - monitor for fevers - at this time symptoms appear most consisent with acute sinusitis and given history.  Do not believe she has COVID given duration and description of symptoms.  However would recommend while she is sick to remain at home until symptoms resolve.  Follow-up 4-6 months or sooner if needed

## 2019-12-02 NOTE — Telephone Encounter (Signed)
Patient is schedule to see Dr Tat on 12-17-2019 by a virtual appt
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
## 2019-12-14 ENCOUNTER — Other Ambulatory Visit: Payer: Self-pay | Admitting: Allergy

## 2019-12-14 ENCOUNTER — Other Ambulatory Visit: Payer: Self-pay

## 2019-12-14 ENCOUNTER — Encounter: Payer: Self-pay | Admitting: Internal Medicine

## 2019-12-14 ENCOUNTER — Ambulatory Visit (INDEPENDENT_AMBULATORY_CARE_PROVIDER_SITE_OTHER): Payer: PPO | Admitting: Internal Medicine

## 2019-12-14 VITALS — BP 124/82 | HR 71 | Temp 98.5°F | Resp 16 | Ht 66.5 in | Wt 166.0 lb

## 2019-12-14 DIAGNOSIS — Z23 Encounter for immunization: Secondary | ICD-10-CM

## 2019-12-14 DIAGNOSIS — M810 Age-related osteoporosis without current pathological fracture: Secondary | ICD-10-CM | POA: Diagnosis not present

## 2019-12-14 DIAGNOSIS — R7303 Prediabetes: Secondary | ICD-10-CM

## 2019-12-14 DIAGNOSIS — I480 Paroxysmal atrial fibrillation: Secondary | ICD-10-CM

## 2019-12-14 DIAGNOSIS — E782 Mixed hyperlipidemia: Secondary | ICD-10-CM | POA: Diagnosis not present

## 2019-12-14 LAB — COMPREHENSIVE METABOLIC PANEL
ALT: 29 U/L (ref 0–35)
AST: 23 U/L (ref 0–37)
Albumin: 4.3 g/dL (ref 3.5–5.2)
Alkaline Phosphatase: 48 U/L (ref 39–117)
BUN: 18 mg/dL (ref 6–23)
CO2: 29 mEq/L (ref 19–32)
Calcium: 9.4 mg/dL (ref 8.4–10.5)
Chloride: 105 mEq/L (ref 96–112)
Creatinine, Ser: 0.8 mg/dL (ref 0.40–1.20)
GFR: 70.99 mL/min (ref >60.00)
Glucose, Bld: 81 mg/dL (ref 70–99)
Potassium: 3.9 mEq/L (ref 3.5–5.1)
Sodium: 140 mEq/L (ref 135–145)
Total Bilirubin: 0.5 mg/dL (ref 0.2–1.2)
Total Protein: 7.1 g/dL (ref 6.0–8.3)

## 2019-12-14 LAB — LIPID PANEL
Cholesterol: 228 mg/dL — ABNORMAL HIGH (ref 0–200)
HDL: 58 mg/dL (ref >39.00)
LDL Cholesterol: 133 mg/dL — ABNORMAL HIGH (ref 0–99)
NonHDL: 170.24
Total CHOL/HDL Ratio: 4
Triglycerides: 185 mg/dL — ABNORMAL HIGH (ref 0.0–149.0)
VLDL: 37 mg/dL (ref 0.0–40.0)

## 2019-12-14 LAB — HEMOGLOBIN A1C: Hgb A1c MFr Bld: 5.8 % (ref 4.6–6.5)

## 2019-12-14 NOTE — Assessment & Plan Note (Signed)
Chronic Check a1c Low sugar / carb diet Stressed regular exercise  

## 2019-12-14 NOTE — Progress Notes (Signed)
Virtual Visit via Video Note The purpose of this virtual visit is to provide medical care while limiting exposure to the novel coronavirus.    Consent was obtained for video visit:  Yes.   Answered questions that patient had about telehealth interaction:  Yes.   I discussed the limitations, risks, security and privacy concerns of performing an evaluation and management service by telemedicine. I also discussed with the patient that there may be a patient responsible charge related to this service. The patient expressed understanding and agreed to proceed.  Pt location: Home Physician Location: home Name of referring provider:  Binnie Rail, MD I connected with [name] at patients initiation/request on 12/17/2019 at 10:15 AM EST by video enabled telemedicine application and verified that I am speaking with the correct person using two identifiers. 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  I have personally reviewed her EEG.  I talked with Dr. Delice Lesch about her ambulatory EEG.  I also reviewed the video on the ambulatory EEG.  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  She subsequently had an MRI of the cervical spine that was fairly unremarkable.  There was cervical spondylosis.  The patient follows up today because she wanted to talk about medications.  She tried gabapentin and she thought it made her jerk even more.  She tried Lyrica and she had suicidal/homicidal ideation.  She mostly notes the jerking of the L hand but occasionally she has jerking of R hand.  She asks me (from her daughter) if she has CTE. Has chronic headache.  Mood is not great.  Very nervous    Current Outpatient Medications on File Prior to Visit  Medication Sig Dispense Refill  . acetaminophen (TYLENOL) 325 MG tablet Take 1-2 tablets (325-650 mg total) by mouth every 4 (four) hours as needed  for mild pain. (Patient taking differently: Take 325 mg by mouth every 4 (four) hours as needed for mild pain or headache. )    . albuterol (PROAIR HFA) 108 (90 Base) MCG/ACT inhaler INHALE 2 PUFFS INTO THE LUNGS EVERY 4 HOURS AS NEEDED FOR COUGH OR WHEEZE. MAY USE 2 PUFFS 10 TO 20 MINUTES PRIOR TO EXERCISE 8.5 each 0  . aspirin-acetaminophen-caffeine (EXCEDRIN MIGRAINE) 250-250-65 MG tablet Take 1 tablet by mouth every 6 (six) hours as needed for headache.    . benzonatate (TESSALON PERLES) 100 MG capsule Take 1 capsule (100 mg total) by mouth 3 (three) times daily as needed. 45 capsule 0  . Calcium Carbonate-Vitamin D (CALCIUM 600+D) 600-400 MG-UNIT per tablet Take 1 tablet by mouth daily.     . Carboxymethylcellul-Glycerin (REFRESH OPTIVE) 1-0.9 % GEL Place 1 application into both eyes at bedtime.     . cholecalciferol (VITAMIN D) 1000 UNITS tablet Take 1,000 Units by mouth daily.      [name] Co-Enzyme Q-10 100 MG CAPS Take 100 mg by mouth daily.     . fluocinonide ointment (LIDEX) AB-123456789 % Apply 1 application topically See admin instructions. Apply topically twice daily for 5 days alternating with Tacrolimus ointment    . fluticasone (FLONASE) 50 MCG/ACT nasal spray USE 1 SPRAY IN EACH NOSTRIL MID-DAY FOR CONGESTION (Patient taking differently: Place 1 spray into both nostrils daily. ) 16 g 4  . fluticasone (FLOVENT HFA) 110 MCG/ACT inhaler Inhale 2 puffs into the lungs 2 (two) times daily.  1 Inhaler 1  . ibandronate (BONIVA) 150 MG tablet Take 150 mg by mouth every 30 (thirty) days. Take in the morning with a full glass of water, on an empty stomach, and do not take anything else by mouth or lie down for the next 30 min.    [name] ipratropium (ATROVENT) 0.03 % nasal spray Place 1 spray into both nostrils every 6 (six) hours as needed for rhinitis. 30 mL 2  . ketotifen (ZADITOR) 0.025 % ophthalmic solution Place 1 drop into both eyes 2 (two) times daily as needed (for dry eyes).     [name] loratadine (CLARITIN) 10  MG tablet Take 10 mg by mouth daily.    [name] MALIC ACID PO Take 1 capsule by mouth at bedtime.    . metoprolol tartrate (LOPRESSOR) 25 MG tablet TAKE 1/2 TABLET BY MOUTH TWICE A DAY 90 tablet 0  . mirabegron ER (MYRBETRIQ) 50 MG TB24 tablet Take 50 mg by mouth daily.    . naproxen sodium (ALEVE) 220 MG tablet Take 220 mg by mouth daily as needed.     . Olopatadine HCl 0.2 % SOLN Apply to eye 2 (two) times daily as needed.    . Omega-3 Fatty Acids (FISH OIL) 1000 MG CAPS Take 1,000 mg by mouth at bedtime.     . polyethylene glycol (MIRALAX) 17 g packet Take 17 g by mouth daily as needed. Increase number of doses as needed 14 each 0  . pravastatin (PRAVACHOL) 10 MG tablet Take 1 tablet by mouth 4 days a week. 48 tablet 1  . senna (SENOKOT) 8.6 MG tablet Take 1 tablet by mouth daily.    . sodium chloride (OCEAN) 0.65 % SOLN nasal spray Place 1 spray into both nostrils as needed for congestion. (Patient taking differently: Place 1 spray into both nostrils 4 (four) times daily as needed for congestion. )  0  . tacrolimus (PROTOPIC) 0.1 % ointment Apply 1 application topically 2 (two) times daily as needed (PRN skin issues). (Patient taking differently: Apply 1 application topically See admin instructions. Apply topically twice daily for 5 days alternating with Fluocinonide ointment) 100 g 0  . Thiamine HCl (VITAMIN B-1) 100 MG tablet Take 100 mg by mouth daily.       No current facility-administered medications on file prior to visit.     Observations/Objective:   Vitals:   12/16/19 1313  Weight: 162 lb (73.5 kg)  Height: 5\' 6"  (1.676 m)   GEN:  The patient appears stated age and is in NAD.  Neurological examination:  Orientation: The patient is alert and oriented x3. Cranial nerves: There is good facial symmetry. There is no facial hypomimia.  The speech is fluent and clear. Soft palate rises symmetrically and there is no tongue deviation. Hearing is intact to conversational tone. Motor:  Strength is at least antigravity x 4.   Shoulder shrug is equal and symmetric.  There is no pronator drift.  Movement examination: Tone: unable Abnormal movements: none seen today     Assessment and Plan:   1.  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    Discussed various medicines.  Clonazepam can help this.  It is possible that Depakote and keppra could help as well.  She would like to try the depakote as she has chronic headache.  I told her that she needs to give it at least 4-6 weeks and we may need to go up on the medication (  she is already somewhat convinced that nothing will help).  It also helps with mood, which she reports is not good.  R/B/SE were discussed.  The opportunity to ask questions was given and they were answered to the best of my ability.  The patient expressed understanding and willingness to follow the outlined treatment protocols.  -discussed that we will watch for neurodegen processes like cbgd but don't see that right now.  It is a good prognostic feature that she is reporting some occasional myoclonus on the right (mostly on the L) as cbgd is usually unilateral  Follow Up Instructions:  5 months  -I discussed the assessment and treatment plan with the patient. The patient was provided an opportunity to ask questions and all were answered. The patient agreed with the plan and demonstrated an understanding of the instructions.   The patient was advised to call back or seek an in-person evaluation if the symptoms worsen or if the condition fails to improve as anticipated.    Total time spent on today's visit was 30 minutes, including both face-to-face time and nonface-to-face time.  Time included that spent on review of records (prior notes available to me/labs/imaging if pertinent), discussing treatment and goals, answering patient's questions and coordinating care.   Alonza Bogus, DO

## 2019-12-14 NOTE — Addendum Note (Signed)
Addended by: Delice Bison E on: 12/14/2019 10:55 AM   Modules accepted: Orders

## 2019-12-14 NOTE — Patient Instructions (Addendum)
  Blood work was ordered.   ° ° °Medications reviewed and updated.  Changes include :   none ° ° ° °Please followup in 6 months ° ° °

## 2019-12-14 NOTE — Assessment & Plan Note (Signed)
Following cardiology H/o PAF  On metoprolol In sinus rhythm Not on A/C due to prior head injury/bleed

## 2019-12-14 NOTE — Assessment & Plan Note (Signed)
Chronic boniva prescribed by gyn dexa up to date Taking calcium and vitamin d

## 2019-12-14 NOTE — Assessment & Plan Note (Addendum)
Chronic Check lipid panel, cmp Continue daily statin Regular exercise and healthy diet encouraged  

## 2019-12-16 ENCOUNTER — Encounter: Payer: Self-pay | Admitting: Neurology

## 2019-12-17 ENCOUNTER — Telehealth: Payer: Self-pay | Admitting: Allergy

## 2019-12-17 ENCOUNTER — Telehealth (INDEPENDENT_AMBULATORY_CARE_PROVIDER_SITE_OTHER): Payer: PPO | Admitting: Neurology

## 2019-12-17 ENCOUNTER — Other Ambulatory Visit: Payer: Self-pay

## 2019-12-17 ENCOUNTER — Other Ambulatory Visit: Payer: Self-pay | Admitting: *Deleted

## 2019-12-17 VITALS — Ht 66.0 in | Wt 162.0 lb

## 2019-12-17 DIAGNOSIS — S069X9S Unspecified intracranial injury with loss of consciousness of unspecified duration, sequela: Secondary | ICD-10-CM

## 2019-12-17 DIAGNOSIS — G253 Myoclonus: Secondary | ICD-10-CM | POA: Diagnosis not present

## 2019-12-17 MED ORDER — IPRATROPIUM BROMIDE 0.03 % NA SOLN: 2.0000 | NASAL | 5 refills | Status: DC | PRN

## 2019-12-17 MED ORDER — DIVALPROEX SODIUM 250 MG PO DR TAB: 250.0000 mg | DELAYED_RELEASE_TABLET | Freq: Two times a day (BID) | ORAL | 3 refills | Status: DC

## 2019-12-17 MED ORDER — BENZONATATE 100 MG PO CAPS: 100.0000 mg | ORAL_CAPSULE | Freq: Three times a day (TID) | ORAL | 0 refills | Status: DC | PRN

## 2019-12-17 NOTE — Telephone Encounter (Signed)
Called patient and advised. Prescriptions have been sent to pharmacy. Patient verbalized understanding.

## 2019-12-17 NOTE — Telephone Encounter (Signed)
Pt called and needs to have Tessalon Perles called into [facility] [place].[phone]

## 2019-12-17 NOTE — Telephone Encounter (Signed)
Patient states she is still having nasal drainage and is currently taking her regimen medications as directed. She had finish the course of antibiotics and prednisone. Patient states she is having to clear her throat a lot and has a continuous drainage for several years. Patient has a cough but not sure if it is related to her Asthma or drainage. She had a televisit recently 12/02/2019 and advise to finish antibiotics. Please advise on refilling Tessalon Pearls.

## 2019-12-17 NOTE — Telephone Encounter (Signed)
That is fine to send in a prescription for Tessalon Perles 1 Perle 3 times daily as needed cough dispense #30 perles. If she is only using her nasal ipratropium 1 spray each nostril she can increase this to 2 sprays each nostril and use it every 4-6 hours as needed to control her nasal drainage.  If she is able to perform nasal saline rinses we will also recommend this to daily.

## 2019-12-18 ENCOUNTER — Other Ambulatory Visit: Payer: PPO

## 2020-01-12 ENCOUNTER — Ambulatory Visit: Payer: PPO | Admitting: Allergy & Immunology

## 2020-01-13 ENCOUNTER — Ambulatory Visit (INDEPENDENT_AMBULATORY_CARE_PROVIDER_SITE_OTHER): Payer: PPO | Admitting: Allergy

## 2020-01-13 ENCOUNTER — Encounter: Payer: Self-pay | Admitting: Allergy

## 2020-01-13 ENCOUNTER — Other Ambulatory Visit: Payer: Self-pay

## 2020-01-13 VITALS — BP 104/70 | HR 74 | Temp 97.6°F | Resp 16 | Ht 66.0 in | Wt 166.8 lb

## 2020-01-13 DIAGNOSIS — J302 Other seasonal allergic rhinitis: Secondary | ICD-10-CM | POA: Diagnosis not present

## 2020-01-13 DIAGNOSIS — J4530 Mild persistent asthma, uncomplicated: Secondary | ICD-10-CM

## 2020-01-13 DIAGNOSIS — J3089 Other allergic rhinitis: Secondary | ICD-10-CM

## 2020-01-13 DIAGNOSIS — H1032 Unspecified acute conjunctivitis, left eye: Secondary | ICD-10-CM | POA: Diagnosis not present

## 2020-01-13 MED ORDER — POLYMYXIN B-TRIMETHOPRIM 10000-0.1 UNIT/ML-% OP SOLN: 1.0000 [drp] | OPHTHALMIC | 0 refills | Status: AC

## 2020-01-13 NOTE — Progress Notes (Signed)
Follow-up Note  RE: PEG SALMINEN MRN: [id number] DOB: 11/13/1951 Date of Office Visit: 01/13/2020   History of present illness: Kara Ramirez is a 69 y.o. female presenting today for follow-up of asthma, allergic rhinitis and recurrent sinusitis.  She had a televisit on 12/02/2019 at which time she was treated for another sinus infection with doxycycline and prednisone.  She states this did help to resolve her symptoms at the time.  We also provide her with Ladona Ridgel that she was also having a lot of coughing which she states does not really help with the cough suspicion. She is having no issues however she states that she has been having a lot of shaking and she does follow with neurology and she was started on Depakote on 12/17/2019 and she does not feel that it has helped with the shaking and she also feels like it is making her having more difficulty in talking and she has been having more dizziness.  She is not sure if the dizziness is related to medications or to some other issue.  She will be talking with her neurologist regarding the issues with the Depakote. With her asthma she states she has been doing well without any need for her rescue inhaler since last month.  She is doing her Flovent 110 mcg 2 puffs twice a day at this time. With her allergy symptoms she states she has not had any significant nasal congestion or drainage she is doing her Flonase and daily and will use the ipratropium spray throughout the day as needed however she is tries not to use it if she is not having any drainage.  She is more so doing saline spray more consistently.  She does take her Claritin daily. She also states she has been having issues for the last couple of days with her left eye being red, draining and crusted.  She has been doing warm compresses.  Review of systems: Review of Systems  Constitutional: Negative.   HENT: Negative.   Eyes: Positive for discharge and redness.  Respiratory:  Negative.   Cardiovascular: Negative.   Gastrointestinal: Negative.   Musculoskeletal: Negative.   Skin: Negative.   Neurological: Positive for dizziness and tremors.    All other systems negative unless noted above in HPI  Past medical/social/surgical/family history have been reviewed and are unchanged unless specifically indicated below.  No changes  Medication List: Current Outpatient Medications  Medication Sig Dispense Refill  . acetaminophen (TYLENOL) 325 MG tablet Take 1-2 tablets (325-650 mg total) by mouth every 4 (four) hours as needed for mild pain. (Patient taking differently: Take 325 mg by mouth every 4 (four) hours as needed for mild pain or headache. )    . albuterol (PROAIR HFA) 108 (90 Base) MCG/ACT inhaler INHALE 2 PUFFS INTO THE LUNGS EVERY 4 HOURS AS NEEDED FOR COUGH OR WHEEZE. MAY USE 2 PUFFS 10 TO 20 MINUTES PRIOR TO EXERCISE 8.5 each 0  . aspirin-acetaminophen-caffeine (EXCEDRIN MIGRAINE) 250-250-65 MG tablet Take 1 tablet by mouth every 6 (six) hours as needed for headache.    . benzonatate (TESSALON PERLES) 100 MG capsule Take 1 capsule (100 mg total) by mouth 3 (three) times daily as needed for cough. 30 capsule 0  . Calcium Carbonate-Vitamin D (CALCIUM 600+D) 600-400 MG-UNIT per tablet Take 1 tablet by mouth daily.     . Carboxymethylcellul-Glycerin (REFRESH OPTIVE) 1-0.9 % GEL Place 1 application into both eyes at bedtime.     . cholecalciferol (VITAMIN  D) 1000 UNITS tablet Take 1,000 Units by mouth daily.      [name] Co-Enzyme Q-10 100 MG CAPS Take 100 mg by mouth daily.     . divalproex (DEPAKOTE) 250 MG DR tablet Take 1 tablet (250 mg total) by mouth 2 (two) times daily. 60 tablet 3  . fluocinonide ointment (LIDEX) AB-123456789 % Apply 1 application topically See admin instructions. Apply topically twice daily for 5 days alternating with Tacrolimus ointment    . fluticasone (FLONASE) 50 MCG/ACT nasal spray USE 1 SPRAY IN EACH NOSTRIL MID-DAY FOR CONGESTION (Patient  taking differently: Place 1 spray into both nostrils daily. ) 16 g 4  . fluticasone (FLOVENT HFA) 110 MCG/ACT inhaler Inhale 2 puffs into the lungs 2 (two) times daily. 1 Inhaler 1  . ibandronate (BONIVA) 150 MG tablet Take 150 mg by mouth every 30 (thirty) days. Take in the morning with a full glass of water, on an empty stomach, and do not take anything else by mouth or lie down for the next 30 min.    [name] ipratropium (ATROVENT) 0.03 % nasal spray Place 2 sprays into both nostrils every 4 (four) hours as needed for rhinitis. 30 mL 5  . ketotifen (ZADITOR) 0.025 % ophthalmic solution Place 1 drop into both eyes 2 (two) times daily as needed (for dry eyes).     [name] loratadine (CLARITIN) 10 MG tablet Take 10 mg by mouth daily.    [name] MALIC ACID PO Take 1 capsule by mouth at bedtime.    . metoprolol tartrate (LOPRESSOR) 25 MG tablet TAKE 1/2 TABLET BY MOUTH TWICE A DAY 90 tablet 0  . mirabegron ER (MYRBETRIQ) 50 MG TB24 tablet Take 50 mg by mouth daily.    . naproxen sodium (ALEVE) 220 MG tablet Take 220 mg by mouth daily as needed.     . Olopatadine HCl 0.2 % SOLN Apply to eye 2 (two) times daily as needed.    . Omega-3 Fatty Acids (FISH OIL) 1000 MG CAPS Take 1,000 mg by mouth at bedtime.     . polyethylene glycol (MIRALAX) 17 g packet Take 17 g by mouth daily as needed. Increase number of doses as needed 14 each 0  . pravastatin (PRAVACHOL) 10 MG tablet Take 1 tablet by mouth 4 days a week. 48 tablet 1  . senna (SENOKOT) 8.6 MG tablet Take 1 tablet by mouth daily.    . sodium chloride (OCEAN) 0.65 % SOLN nasal spray Place 1 spray into both nostrils as needed for congestion. (Patient taking differently: Place 1 spray into both nostrils 4 (four) times daily as needed for congestion. )  0  . tacrolimus (PROTOPIC) 0.1 % ointment Apply 1 application topically 2 (two) times daily as needed (PRN skin issues). (Patient taking differently: Apply 1 application topically See admin instructions. Apply topically twice  daily for 5 days alternating with Fluocinonide ointment) 100 g 0  . Thiamine HCl (VITAMIN B-1) 100 MG tablet Take 100 mg by mouth daily.      [name] trimethoprim-polymyxin b (POLYTRIM) ophthalmic solution Place 1 drop into the left eye every 4 (four) hours for 7 days. 10 mL 0   No current facility-administered medications for this visit.     Known medication allergies: Allergies  Allergen Reactions  . Mold Extract [Trichophyton Mentagrophyte] Shortness Of Breath and Rash  . Penicillins Shortness Of Breath, Rash and Other (See Comments)    Eyes puffy Has taken low dose pcn and no rx REACTION: rash, SOB Has  patient had a PCN reaction causing immediate rash, facial/tongue/throat swelling, SOB or lightheadedness with hypotension: yes Has patient had a PCN reaction causing severe rash involving mucus membranes or skin necrosis: unk Has patient had a PCN reaction that required hospitalization: no Has patient had a PCN reaction occurring within the last 10 years: unk If all of the above answers are "NO", then may proceed with Cephalospor  . Morphine Other (See Comments)    REACTION: tachycardia and anxiety  . Peanut Oil Nausea And Vomiting    Peanut butter  . Protonix [Pantoprazole Sodium] Nausea And Vomiting  . Citrus Other (See Comments)    Unknown  . Peanut-Containing Drug Products Other (See Comments)    Unknown  . Tramadol Other (See Comments)    Makes crazy;confused  . Valium [Diazepam] Other (See Comments)    Confusion per family  . Cetirizine Rash and Other (See Comments)    Around face  . Codeine Other (See Comments)    REACTION: dizzy and "groggy in my head"  . Eggs Or Egg-Derived Products Nausea And Vomiting  . Latex Itching  . Pentazocine Lactate Palpitations     Physical examination: Blood pressure 104/70, pulse 74, temperature 97.6 F (36.4 C), temperature source Temporal, resp. rate 16, height 5\' 6"  (1.676 m), weight 166 lb 12.8 oz (75.7 kg), SpO2 96 %.  General:  Alert, interactive, in no acute distress. HEENT: PERRLA, left eye with scleral injection as well as crusting of the lashes, right eye is normal, TMs pearly gray, turbinates minimally edematous without discharge, post-pharynx non erythematous. Neck: Supple without lymphadenopathy. Lungs: Clear to auscultation without wheezing, rhonchi or rales. {no increased work of breathing. CV: Normal S1, S2 without murmurs. Abdomen: Nondistended, nontender. Skin: Warm and dry, without lesions or rashes. Extremities:  No clubbing, cyanosis or edema. Neuro:   Grossly intact.  Diagnositics/Labs: None today  Assessment and plan:   1. Mild persistent asthma - Daily controller medication(s): decrease to Flovent 117mcg 1 puffs twice daily with spacer.   If having increased respiratory symptoms or albuterol use increase back to Flovent 2 puffs twice a day - Prior to physical activity: albuterol 2 puffs 10-15 minutes before physical activity. - Rescue medications: albuterol 4 puffs every 4-6 hours as needed - Asthma control goals:  * Full participation in all desired activities (may need albuterol before activity) * Albuterol use two time or less a week on average (not counting use with activity) * Cough interfering with sleep two time or less a month * Oral steroids no more than once a year * No hospitalizations  2. Seasonal and perennial allergic rhinitis - continue avoidance of  grasses, indoor molds and outdoor molds - Continue Flonase two sprays per nostril and use 3 days a week (M/W/F). - Continue ipratropium one spray per nostril every 8 hours as needed if having nasal drainage.  - Continue with Claritin daily  3. Conjunctivitis  - left eye with crusting, goopiness, redness  - use polymyxinB 1 drop to left eye every 3-4 hours for next week  - continue use of warm compresses to left eye  Follow-up 4-6 months or sooner if needed I appreciate the opportunity to take part in Lydiah's care. Please  do not hesitate to contact me with questions.  Sincerely,   Prudy Feeler, MD Allergy/Immunology [facility]

## 2020-01-13 NOTE — Patient Instructions (Addendum)
1. Mild persistent asthma - Daily controller medication(s): decrease to Flovent 118mcg 1 puffs twice daily with spacer.   If having increased respiratory symptoms or albuterol use increase back to Flovent 2 puffs twice a day - Prior to physical activity: albuterol 2 puffs 10-15 minutes before physical activity. - Rescue medications: albuterol 4 puffs every 4-6 hours as needed - Asthma control goals:  * Full participation in all desired activities (may need albuterol before activity) * Albuterol use two time or less a week on average (not counting use with activity) * Cough interfering with sleep two time or less a month * Oral steroids no more than once a year * No hospitalizations  2. Seasonal and perennial allergic rhinitis - continue avoidance of  grasses, indoor molds and outdoor molds - Continue Flonase two sprays per nostril and use 3 days a week (M/W/F). - Continue ipratropium one spray per nostril every 8 hours as needed if having nasal drainage.  - Continue with Claritin daily  3. Conjunctivitis  - left eye with crusting, goopiness, redness  - use polymyxinB 1 drop to left eye every 3-4 hours for next week  - continue use of warm compresses to left eye  Follow-up 4-6 months or sooner if needed

## 2020-01-24 ENCOUNTER — Ambulatory Visit: Payer: PPO | Attending: Internal Medicine

## 2020-01-24 ENCOUNTER — Other Ambulatory Visit: Payer: Self-pay

## 2020-01-24 DIAGNOSIS — Z23 Encounter for immunization: Secondary | ICD-10-CM

## 2020-01-24 NOTE — Progress Notes (Signed)
   Covid-19 Vaccination Clinic  Name:  Kara Ramirez    MRN: [id number] DOB: 03-Mar-1951  01/24/2020  [name] was observed post Covid-19 immunization for 30 minutes based on pre-vaccination screening without incident. She was provided with Vaccine Information Sheet and instruction to access the V-Safe system.   [name] was instructed to call 911 with any severe reactions post vaccine: [name] Difficulty breathing  . Swelling of face and throat  . A fast heartbeat  . A bad rash all over body  . Dizziness and weakness   Immunizations Administered    Name Date Dose VIS Date Route   Pfizer COVID-19 Vaccine 01/24/2020 12:17 PM 0.3 mL 10/30/2019 Intramuscular   Manufacturer: [place]   Lot: [id number]   [place]: [id number]

## 2020-01-27 DIAGNOSIS — Z6826 Body mass index (BMI) 26.0-26.9, adult: Secondary | ICD-10-CM | POA: Diagnosis not present

## 2020-01-27 DIAGNOSIS — S069X0A Unspecified intracranial injury without loss of consciousness, initial encounter: Secondary | ICD-10-CM | POA: Diagnosis not present

## 2020-01-27 DIAGNOSIS — S39012A Strain of muscle, fascia and tendon of lower back, initial encounter: Secondary | ICD-10-CM | POA: Diagnosis not present

## 2020-01-27 DIAGNOSIS — M4120 Other idiopathic scoliosis, site unspecified: Secondary | ICD-10-CM | POA: Diagnosis not present

## 2020-02-09 ENCOUNTER — Other Ambulatory Visit: Payer: Self-pay

## 2020-02-09 ENCOUNTER — Ambulatory Visit (INDEPENDENT_AMBULATORY_CARE_PROVIDER_SITE_OTHER): Payer: PPO | Admitting: Allergy & Immunology

## 2020-02-09 ENCOUNTER — Encounter: Payer: Self-pay | Admitting: Allergy & Immunology

## 2020-02-09 VITALS — BP 102/70 | HR 81 | Temp 97.2°F | Resp 20

## 2020-02-09 DIAGNOSIS — J0181 Other acute recurrent sinusitis: Secondary | ICD-10-CM

## 2020-02-09 DIAGNOSIS — J3089 Other allergic rhinitis: Secondary | ICD-10-CM | POA: Diagnosis not present

## 2020-02-09 DIAGNOSIS — J302 Other seasonal allergic rhinitis: Secondary | ICD-10-CM

## 2020-02-09 DIAGNOSIS — J4530 Mild persistent asthma, uncomplicated: Secondary | ICD-10-CM | POA: Diagnosis not present

## 2020-02-09 MED ORDER — DOXYCYCLINE HYCLATE 100 MG PO TABS: 100.0000 mg | ORAL_TABLET | Freq: Two times a day (BID) | ORAL | 0 refills | Status: AC

## 2020-02-09 NOTE — Patient Instructions (Addendum)
1. Mild persistent asthma, uncomplicated - Lung testing looked excellent today. - We are not going to make any changes to your regimen that Dr. Nelva Bush laid out.  - If the shortness of breath returns, I would talk to Cardiology.  - Daily controller medication(s): Flovent 147mcg 1 puff twice daily with spacer - Prior to physical activity: albuterol 2 puffs 10-15 minutes before physical activity. - Rescue medications: albuterol 4 puffs every 4-6 hours as needed - Asthma control goals:  * Full participation in all desired activities (may need albuterol before activity) * Albuterol use two time or less a week on average (not counting use with activity) * Cough interfering with sleep two time or less a month * Oral steroids no more than once a year * No hospitalizations  2. Seasonal and perennial allergic rhinitis (grasses, indoor molds and outdoor molds) - Continue with the Flonase two sprays per nostril daily on M/W/F - Continue with nasal ipratropium one spray per nostril every 8 hours   3. Chronic sinusitis - We are going to do another round of prednisone and doxycycline. - If this does not work, we are going to send you for a sinus CT to evaluate for any anatomical abnormalities.   4. Return in about 3 months (around 05/11/2020). This can be an in-person, a virtual Webex or a telephone follow up visit.   Please inform us of any Emergency Department visits, hospitalizations, or changes in symptoms. Call us before going to the ED for breathing or allergy symptoms since we might be able to fit you in for a sick visit. Feel free to contact us anytime with any questions, problems, or concerns.  It was a pleasure to see you again today!  Websites that have reliable patient information: 1. American Academy of Asthma, Allergy, and Immunology: www.aaaai.org 2. Food Allergy Research and Education (FARE): foodallergy.org 3. Mothers of Asthmatics: http://www.asthmacommunitynetwork.org 4. American  College of Allergy, Asthma, and Immunology: www.acaai.org   COVID-19 Vaccine Information can be found at: ShippingScam.co.uk For questions related to vaccine distribution or appointments, please email vaccine@[email or web address] or call [phone].     "Like" [place] on Facebook and Instagram for our latest updates!       HAPPY SPRING!  Make sure you are registered to vote! If you have moved or changed any of your contact information, you will need to get this updated before voting!  In some cases, you MAY be able to register to vote online: [email or web address]

## 2020-02-09 NOTE — Progress Notes (Signed)
FOLLOW UP  Date of Service/Encounter:  02/09/20   Assessment:   Mild persistent asthma, uncomplicated  Seasonal and perennial allergic rhinitis(grasses, indoor molds, outdoor molds)  Recurrent infections  Complicated past medical history including atrial fibrillation has a granuloma isolated from her lung exam   Plan/Recommendations:   1. Mild persistent asthma, uncomplicated - Lung testing looked excellent today. - We are not going to make any changes to your regimen that Dr. Nelva Bush laid out.  - If the shortness of breath returns, I would talk to Cardiology.  - Daily controller medication(s): Flovent 138mcg 1 puff twice daily with spacer - Prior to physical activity: albuterol 2 puffs 10-15 minutes before physical activity. - Rescue medications: albuterol 4 puffs every 4-6 hours as needed - Asthma control goals:  * Full participation in all desired activities (may need albuterol before activity) * Albuterol use two time or less a week on average (not counting use with activity) * Cough interfering with sleep two time or less a month * Oral steroids no more than once a year * No hospitalizations  2. Seasonal and perennial allergic rhinitis (grasses, indoor molds and outdoor molds) - Continue with the Flonase two sprays per nostril daily on M/W/F - Continue with nasal ipratropium one spray per nostril every 8 hours   3. Chronic sinusitis - We are going to do another round of prednisone and doxycycline. - If this does not work, we are going to send you for a sinus CT to evaluate for any anatomical abnormalities.   4. Return in about 3 months (around 05/11/2020). This can be an in-person, a virtual Webex or a telephone follow up visit.  Subjective:   Kara Ramirez is a 69 y.o. female presenting today for follow up of  Chief Complaint  Patient presents with  . Asthma    Kara Ramirez has a history of the following: Patient Active Problem List   Diagnosis  Date Noted  . Vaginal prolapse 12/17/2018  . Jerking 09/22/2018  . Closed head injury 09/11/2018  . Mild intermittent asthma without complication [id number]  . Multinodular thyroid, follow up US in 05/2019 06/10/2018  . Fibromyalgia 05/26/2018  . 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 to 5 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 ([place]) 03/25/2018  . Coccygeal pain 03/25/2018  . Difficulty with speech 03/24/2018  . Poor balance 03/24/2018  . Hip pain 03/24/2018  . SAH (subarachnoid hemorrhage) ([place]) 03/06/2018  . Chest tightness 02/04/2018  . Left lower lobe pulmonary nodule 12/10/2017  . Paroxysmal atrial fibrillation ([place]) 10/30/2017  . Shortness of breath 08/30/2017  . Chronic sinusitis 03/14/2017  . Severe scoliosis 12/11/2016  . Prediabetes 06/06/2016  . Cough 06/06/2016  . Allergic rhinitis 02/08/2016  . Osteoporosis 12/05/2015  . Hyperlipidemia 05/27/2015  . Internal hemorrhoids 08/23/2011  . Constipation 08/23/2011    History obtained from: chart review and patient.  Kara Ramirez is a 69 y.o. female presenting for a sick visit.  She was last seen in February 2021 by Dr. Nelva Bush.  At that time, her Flovent was decreased to 1 puff twice daily with a spacer.  She did recommend increasing back to 2 puffs twice daily with any flares.  She was encouraged to use albuterol as needed.  For her rhinitis, she was recommended to continue with Flonase and nasal Atrovent.  Her Flonase was decreased to 3 times a week.  She has a history of sensitizations to grasses, indoor molds, and outdoor molds.  She was also  continued on Claritin daily.  She was noted to have conjunctivitis on the left eye with crusting. Polymyxin drops were started every 3-4 hours for 1 week.  Since last visit, she has done well. She reports that she has drainage and difficulty breathing with coughing and coughing. She has "nasty greenish brown" call of mucous. She then develops some shortness of breath with these episodes. She  does have incentive spirometry. This has been ongoing daily for around 10 days.   She has bene using her fluticasone nasal spray three times weekly. She is having postnasal drip with getting stuck.  She describes in detail the mucus that she produces.  She also reports bubbling in her throat.  She feels that the whole constellation of symptoms start a sinus disease and then go down into her chest with the postnasal drip.   She does have a history of heart issues and is wondering whether she needs to go to the cardiologist.  She sees Dr. Quay Burow.   She does not think that she is ever seen an otolaryngologist, but review of her chart shows that she did see Dr. Redmond Baseman in September 2020 for a multinodular goiter.  An FNA was benign.  She has been vaccinated with one injection. Her next injection is early April. She did fine without any problems at all.   Otherwise, there have been no changes to her past medical history, surgical history, family history, or social history.    Review of Systems  Constitutional: Negative.  Negative for fever, malaise/fatigue and weight loss.  HENT: Positive for congestion, ear pain, sinus pain and tinnitus. Negative for ear discharge and nosebleeds.   Eyes: Negative for pain, discharge and redness.  Respiratory: Negative for cough, sputum production, shortness of breath and wheezing.   Cardiovascular: Negative.  Negative for chest pain and palpitations.  Gastrointestinal: Positive for nausea. Negative for abdominal pain, constipation, diarrhea, heartburn and vomiting.  Skin: Negative.  Negative for itching and rash.  Neurological: Negative for dizziness and headaches.  Endo/Heme/Allergies: Negative for environmental allergies. Does not bruise/bleed easily.       Objective:   Blood pressure 102/70, pulse 81, temperature (!) 97.2 F (36.2 C), temperature source Temporal, resp. rate 20, SpO2 97 %. There is no height or weight on file to calculate  BMI.   Physical Exam:  Physical Exam  Constitutional: She appears well-developed.  Using a cane.  HENT:  Head: Normocephalic and atraumatic.  Right Ear: Tympanic membrane, external ear and ear canal normal.  Left Ear: Tympanic membrane, external ear and ear canal normal.  Nose: Mucosal edema and rhinorrhea present. No nasal deformity or septal deviation. No epistaxis. Right sinus exhibits no maxillary sinus tenderness and no frontal sinus tenderness. Left sinus exhibits no maxillary sinus tenderness and no frontal sinus tenderness.  Mouth/Throat: Uvula is midline and oropharynx is clear and moist. Mucous membranes are not pale and not dry.  She does have some scant mucus in the right naris, but the left nare seems clear.  She does have maxillary sinus tenderness bilaterally.  There is no mucus draining down the back of her throat.  Tonsils are unremarkable.  Eyes: Pupils are equal, round, and reactive to light. Conjunctivae and EOM are normal. Right eye exhibits no chemosis and no discharge. Left eye exhibits no chemosis and no discharge. Right conjunctiva is not injected. Left conjunctiva is not injected.  Cardiovascular: Normal rate, regular rhythm and normal heart sounds.  Respiratory: Effort normal and breath sounds normal.  No accessory muscle usage. No tachypnea. No respiratory distress. She has no wheezes. She has no rhonchi. She has no rales. She exhibits no tenderness.  Moving air well in all lung fields.  No increased work of breathing.  Lymphadenopathy:    She has no cervical adenopathy.  Neurological: She is alert.  Skin: No abrasion, no petechiae and no rash noted. Rash is not papular, not vesicular and not urticarial. No erythema. No pallor.  No eczematous or urticarial lesions noted.  Psychiatric: She has a normal mood and affect.     Diagnostic studies:    Spirometry: results normal (FEV1: 2.12/92%, FVC: 2.79/91%, FEV1/FVC: 76%).    Spirometry consistent with normal  pattern.   Allergy Studies: none      Salvatore Marvel, MD  [facility]

## 2020-02-23 ENCOUNTER — Ambulatory Visit: Payer: PPO | Attending: Internal Medicine

## 2020-02-23 DIAGNOSIS — Z23 Encounter for immunization: Secondary | ICD-10-CM

## 2020-02-23 NOTE — Progress Notes (Signed)
   Covid-19 Vaccination Clinic  Name:  Kara Ramirez    MRN: [id number] DOB: [birth year]  02/23/2020  [name] was observed post Covid-19 immunization for 30 minutes based on pre-vaccination screening without incident. She was provided with Vaccine Information Sheet and instruction to access the V-Safe system.   [name] was instructed to call 911 with any severe reactions post vaccine: [name] Difficulty breathing  . Swelling of face and throat  . A fast heartbeat  . A bad rash all over body  . Dizziness and weakness   Immunizations Administered    Name Date Dose VIS Date Route   Pfizer COVID-19 Vaccine 02/23/2020 11:58 AM 0.3 mL 10/30/2019 Intramuscular   Manufacturer: [place]   Lot: [id number]   [place]: [id number]

## 2020-03-02 ENCOUNTER — Telehealth: Payer: Self-pay | Admitting: Neurology

## 2020-03-02 NOTE — Telephone Encounter (Signed)
Patient called and left a message stating, "I need a change in my medication. The one I am taking is making me upset and irritable."  I returned the call and she said she thinks the Depakote is causing her problems. She said, "Little things just get to me and really upset me, even something like a car horn."  Kara Ramirez on [place]

## 2020-03-03 NOTE — Telephone Encounter (Signed)
Pt called no answer voice mail left for pt to call back 

## 2020-03-03 NOTE — Telephone Encounter (Signed)
Patient returned call to Heather. 

## 2020-03-03 NOTE — Telephone Encounter (Signed)
I have no objection to her stopping it BUT that medication is also used for the treatment of mood disorders, including bipolar disorder (not saying she has that), so it would be very unusual for that medication to cause irritability.  I'm of course using it to treat her movements and she also was hoping it would help her headache.  Did it help any of those things?

## 2020-03-03 NOTE — Telephone Encounter (Signed)
Pt stated that it helped with her arm but not the head aches. She stated she was going to go off of it for a few weeks to see if she fells better off of the medication.

## 2020-03-08 DIAGNOSIS — H532 Diplopia: Secondary | ICD-10-CM | POA: Diagnosis not present

## 2020-03-08 DIAGNOSIS — S069X9A Unspecified intracranial injury with loss of consciousness of unspecified duration, initial encounter: Secondary | ICD-10-CM | POA: Diagnosis not present

## 2020-03-08 DIAGNOSIS — H01009 Unspecified blepharitis unspecified eye, unspecified eyelid: Secondary | ICD-10-CM | POA: Diagnosis not present

## 2020-03-08 DIAGNOSIS — H4912 Fourth [trochlear] nerve palsy, left eye: Secondary | ICD-10-CM | POA: Diagnosis not present

## 2020-03-08 DIAGNOSIS — H538 Other visual disturbances: Secondary | ICD-10-CM | POA: Diagnosis not present

## 2020-03-14 ENCOUNTER — Other Ambulatory Visit: Payer: Self-pay | Admitting: Internal Medicine

## 2020-03-15 ENCOUNTER — Telehealth: Payer: Self-pay | Admitting: Allergy

## 2020-03-15 NOTE — Telephone Encounter (Signed)
Yes given pt having fever, fatigue, HA would prefer televisit.

## 2020-03-15 NOTE — Telephone Encounter (Signed)
Appointment switched to televisit and patient notified.

## 2020-03-15 NOTE — Telephone Encounter (Signed)
Patient called and states she does not feel good at all. Patient is tired and has a fever and has had a headache for a few days. Patient states that she has not been around anyone with COVID and has been vaccinated. Patient states she has been taking her regular allergy medication. Scheduled appointment 4/28 at 10:40am. Would you prefer to have a televisit with patient?

## 2020-03-16 ENCOUNTER — Ambulatory Visit (INDEPENDENT_AMBULATORY_CARE_PROVIDER_SITE_OTHER): Payer: PPO | Admitting: Allergy

## 2020-03-16 ENCOUNTER — Other Ambulatory Visit: Payer: Self-pay

## 2020-03-16 ENCOUNTER — Encounter: Payer: Self-pay | Admitting: Allergy

## 2020-03-16 DIAGNOSIS — J0181 Other acute recurrent sinusitis: Secondary | ICD-10-CM

## 2020-03-16 DIAGNOSIS — J302 Other seasonal allergic rhinitis: Secondary | ICD-10-CM

## 2020-03-16 DIAGNOSIS — J3089 Other allergic rhinitis: Secondary | ICD-10-CM

## 2020-03-16 DIAGNOSIS — J4531 Mild persistent asthma with (acute) exacerbation: Secondary | ICD-10-CM

## 2020-03-16 NOTE — Progress Notes (Signed)
RE: Kara Ramirez MRN: [id number] DOB: [birth year] Date of Telemedicine Visit: 03/16/2020  Referring provider: Binnie Rail, MD Primary care provider: Binnie Rail, MD  Chief Complaint: Sick Visit (Since last Saturday she has been having fatigue, fever(patient says that her normal is 97 and her temp reached 98.6). never over 100 though. having worsening headaches, chills for one day, no change/loss of taste or smell, has had loss of appetite, had diarrhea, no vomiting. she is coughing up lots of greenish phlegm. )   Telemedicine Follow Up Visit via Telephone: I connected with Kara Ramirez for a follow up on 03/16/20 by telephone and verified that I am speaking with the correct person using two identifiers.   I discussed the limitations, risks, security and privacy concerns of performing an evaluation and management service by telephone and the availability of in person appointments. I also discussed with the patient that there may be a patient responsible charge related to this service. The patient expressed understanding and agreed to proceed.  Patient is at home.  Provider is at the office.  Visit start time: [id number] Visit end time: 1114 Insurance consent/check in by: Kirke Corin Medical consent and medical assistant/nurse: Kayla B  History of Present Illness: She is a 69 y.o. female, who is being followed for asthma, allergic rhinitis with chronic rhinosinusitis. Her previous allergy office visit was on 02/09/2020 with Dr. Ernst Ramirez.   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  She states she has tried something to take for dry mouth as well as taking all her routine allergy medications including Flonase Monday Wednesday Friday, Claritin daily, ipratropium as needed, Flovent twice a day and albuterol.  She states she has needed to use her albuterol  three times a day since her symptoms started.  She states that her temperature has been highest of 98.6.  She states she has not been around any Covid exposures.  She pretty much stays at home for the most part.  She denies any loss of taste or smell.  She states that around this time of the year with the tree pollen exposure and changes in the seasons and weather that she typically will have a flareup of her allergy symptoms that typically would lead into a sinusitis which she has a history of.  Assessment and Plan: Kara Ramirez is a 69 y.o. female with:   Mild persistent asthma with exacerbation - For current exacerbation with increase in albuterol needs advise she take a prednisone pack as described for 5 days  - Daily controller medication(s): Flovent 182mcg 1 puff twice daily with spacer - Prior to physical activity: albuterol 2 puffs 10-15 minutes before physical activity. - Rescue medications: albuterol 4 puffs every 4-6 hours as needed - Asthma control goals:  * Full participation in all desired activities (may need albuterol before activity) * Albuterol use two time or less a week on average (not counting use with activity) * Cough interfering with sleep two time or less a month * Oral steroids no more than once a year * No hospitalizations  Seasonal and perennial allergic rhinitis (grasses, indoor molds and outdoor molds) - For worsening nasal congestion that is not improved with regular Flonase we will have her try XHANCE nasal spray device.  This nasal spray device contains fluticasone at a higher dosage than what is found in Flonase.  It also allows for deeper deposition of the medicated spray deeper into her sinuses for better effect.  She can take this 2 sprays each nostril twice a day at this time. - Can continue with nasal ipratropium one spray per nostril every 8 hours as needed - Continue Claritin daily  Chronic sinusitis - We are going to do another round of prednisone.   - Do  not believe at this time that she warrants use of an antibiotic given duration of symptoms.  - consider sinus CT to evaluate for any anatomical abnormalities if continues to have infections  Return in about 3 months. This can be an in-person, a virtual Webex or a telephone follow up visit.  Diagnostics: None.  Medication List:  Current Outpatient Medications  Medication Sig Dispense Refill  . acetaminophen (TYLENOL) 325 MG tablet Take 1-2 tablets (325-650 mg total) by mouth every 4 (four) hours as needed for mild pain. (Patient taking differently: Take 325 mg by mouth every 4 (four) hours as needed for mild pain or headache. )    . albuterol (PROAIR HFA) 108 (90 Base) MCG/ACT inhaler INHALE 2 PUFFS INTO THE LUNGS EVERY 4 HOURS AS NEEDED FOR COUGH OR WHEEZE. MAY USE 2 PUFFS 10 TO 20 MINUTES PRIOR TO EXERCISE 8.5 each 0  . aspirin-acetaminophen-caffeine (EXCEDRIN MIGRAINE) 250-250-65 MG tablet Take 1 tablet by mouth every 6 (six) hours as needed for headache.    . benzonatate (TESSALON PERLES) 100 MG capsule Take 1 capsule (100 mg total) by mouth 3 (three) times daily as needed for cough. 30 capsule 0  . Calcium Carbonate-Vitamin D (CALCIUM 600+D) 600-400 MG-UNIT per tablet Take 1 tablet by mouth daily.     . Carboxymethylcellul-Glycerin (REFRESH OPTIVE) 1-0.9 % GEL Place 1 application into both eyes at bedtime.     . cholecalciferol (VITAMIN D) 1000 UNITS tablet Take 1,000 Units by mouth daily.      [name] Co-Enzyme Q-10 100 MG CAPS Take 100 mg by mouth daily.     . divalproex (DEPAKOTE) 250 MG DR tablet Take 1 tablet (250 mg total) by mouth 2 (two) times daily. 60 tablet 3  . fluocinonide ointment (LIDEX) [id number] % Apply 1 application topically See admin instructions. Apply topically twice daily for 5 days alternating with Tacrolimus ointment    . fluticasone (FLONASE) 50 MCG/ACT nasal spray USE 1 SPRAY IN EACH NOSTRIL MID-DAY FOR CONGESTION (Patient taking differently: Place 1 spray into both nostrils  daily. ) 16 g 4  . fluticasone (FLOVENT HFA) 110 MCG/ACT inhaler Inhale 2 puffs into the lungs 2 (two) times daily. 1 Inhaler 1  . ibandronate (BONIVA) 150 MG tablet Take 150 mg by mouth every 30 (thirty) days. Take in the morning with a full glass of water, on an empty stomach, and do not take anything else by mouth or lie down for the next 30 min.    [name] ipratropium (ATROVENT) 0.03 % nasal spray Place 2 sprays into both nostrils every 4 (four) hours as needed for rhinitis. 30 mL 5  . loratadine (CLARITIN) 10 MG tablet Take 10 mg by mouth daily.    [name] MALIC ACID PO Take 1 capsule by mouth at bedtime.    . metoprolol tartrate (LOPRESSOR) 25 MG tablet TAKE 1/2 TABLET BY MOUTH TWICE A DAY 90 tablet 0  . mirabegron ER (MYRBETRIQ) 50 MG TB24 tablet Take 50 mg by mouth daily as needed.     . naproxen sodium (ALEVE) 220 MG tablet Take 220 mg by  mouth daily as needed.     . Olopatadine HCl 0.2 % SOLN Apply to eye 2 (two) times daily as needed.    . Omega-3 Fatty Acids (FISH OIL) 1000 MG CAPS Take 1,000 mg by mouth at bedtime.     . polyethylene glycol (MIRALAX) 17 g packet Take 17 g by mouth daily as needed. Increase number of doses as needed 14 each 0  . pravastatin (PRAVACHOL) 10 MG tablet TAKE 1 TABLET BY MOUTH FOUR DAYS PER WEEK 48 tablet 0  . senna (SENOKOT) 8.6 MG tablet Take 1 tablet by mouth daily.    . sodium chloride (OCEAN) 0.65 % SOLN nasal spray Place 1 spray into both nostrils as needed for congestion. (Patient taking differently: Place 1 spray into both nostrils 4 (four) times daily as needed for congestion. )  0  . tacrolimus (PROTOPIC) 0.1 % ointment Apply 1 application topically 2 (two) times daily as needed (PRN skin issues). (Patient taking differently: Apply 1 application topically See admin instructions. Apply topically twice daily for 5 days alternating with Fluocinonide ointment) 100 g 0  . Thiamine HCl (VITAMIN B-1) 100 MG tablet Take 100 mg by mouth daily.       No current  facility-administered medications for this visit.   Allergies: Allergies  Allergen Reactions  . Mold Extract [Trichophyton Mentagrophyte] Shortness Of Breath and Rash  . Penicillins Shortness Of Breath, Rash and Other (See Comments)    Eyes puffy Has taken low dose pcn and no rx REACTION: rash, SOB Has patient had a PCN reaction causing immediate rash, facial/tongue/throat swelling, SOB or lightheadedness with hypotension: yes Has patient had a PCN reaction causing severe rash involving mucus membranes or skin necrosis: unk Has patient had a PCN reaction that required hospitalization: no Has patient had a PCN reaction occurring within the last 10 years: unk If all of the above answers are "NO", then may proceed with Cephalospor  . Morphine Other (See Comments)    REACTION: tachycardia and anxiety  . Peanut Oil Nausea And Vomiting    Peanut butter  . Protonix [Pantoprazole Sodium] Nausea And Vomiting  . Citrus Other (See Comments)    Unknown  . Peanut-Containing Drug Products Other (See Comments)    Unknown  . Tramadol Other (See Comments)    Makes crazy;confused  . Valium [Diazepam] Other (See Comments)    Confusion per family  . Cetirizine Rash and Other (See Comments)    Around face  . Codeine Other (See Comments)    REACTION: dizzy and "groggy in my head"  . Eggs Or Egg-Derived Products Nausea And Vomiting  . Latex Itching  . Pentazocine Lactate Palpitations   I reviewed her past medical history, social history, family history, and environmental history and no significant changes have been reported from previous visit on 02/09/20.  Review of Systems  Constitutional: Negative.   HENT:       See HPI  Eyes: Negative.   Respiratory:       See HPI  Cardiovascular: Negative.   Gastrointestinal:       See HPI  Skin: Negative.   Neurological: Negative.    Objective: Physical Exam Not obtained as encounter was done via telephone.   Previous notes and tests were  reviewed.  I discussed the assessment and treatment plan with the patient. The patient was provided an opportunity to ask questions and all were answered. The patient agreed with the plan and demonstrated an understanding of the instructions.   The  patient was advised to call back or seek an in-person evaluation if the symptoms worsen or if the condition fails to improve as anticipated.  I provided 19 minutes of non-face-to-face time during this encounter.  It was my pleasure to participate in [facility] care today. Please feel free to contact me with any questions or concerns.   Sincerely,  Adalay Azucena Charmian Muff, MD

## 2020-03-16 NOTE — Patient Instructions (Addendum)
Mild persistent asthma with exacerbation - For current exacerbation with increase in albuterol needs advise she take a prednisone pack as described for 5 days  - Daily controller medication(s): Flovent 128mcg 1 puff twice daily with spacer - Prior to physical activity: albuterol 2 puffs 10-15 minutes before physical activity. - Rescue medications: albuterol 4 puffs every 4-6 hours as needed - Asthma control goals:  * Full participation in all desired activities (may need albuterol before activity) * Albuterol use two time or less a week on average (not counting use with activity) * Cough interfering with sleep two time or less a month * Oral steroids no more than once a year * No hospitalizations  Seasonal and perennial allergic rhinitis (grasses, indoor molds and outdoor molds) - For worsening nasal congestion that is not improved with regular Flonase we will have her try XHANCE nasal spray device.  This nasal spray device contains fluticasone at a higher dosage than what is found in Flonase.  It also allows for deeper deposition of the medicated spray deeper into her sinuses for better effect.  She can take this 2 sprays each nostril twice a day at this time. - Can continue with nasal ipratropium one spray per nostril every 8 hours as needed - Continue Claritin daily  Chronic sinusitis - We are going to do another round of prednisone.   - Do not believe at this time that she warrants use of an antibiotic given duration of symptoms.  - consider sinus CT to evaluate for any anatomical abnormalities if continues to have infections  Return in about 3 months. This can be an in-person, a virtual Webex or a telephone follow up visit.

## 2020-04-06 DIAGNOSIS — L84 Corns and callosities: Secondary | ICD-10-CM | POA: Diagnosis not present

## 2020-04-06 DIAGNOSIS — L900 Lichen sclerosus et atrophicus: Secondary | ICD-10-CM | POA: Diagnosis not present

## 2020-04-06 DIAGNOSIS — L905 Scar conditions and fibrosis of skin: Secondary | ICD-10-CM | POA: Diagnosis not present

## 2020-04-06 DIAGNOSIS — D1801 Hemangioma of skin and subcutaneous tissue: Secondary | ICD-10-CM | POA: Diagnosis not present

## 2020-04-06 DIAGNOSIS — B351 Tinea unguium: Secondary | ICD-10-CM | POA: Diagnosis not present

## 2020-04-06 DIAGNOSIS — L814 Other melanin hyperpigmentation: Secondary | ICD-10-CM | POA: Diagnosis not present

## 2020-04-07 DIAGNOSIS — N3000 Acute cystitis without hematuria: Secondary | ICD-10-CM | POA: Diagnosis not present

## 2020-04-07 DIAGNOSIS — N3281 Overactive bladder: Secondary | ICD-10-CM | POA: Diagnosis not present

## 2020-04-15 DIAGNOSIS — N819 Female genital prolapse, unspecified: Secondary | ICD-10-CM | POA: Diagnosis not present

## 2020-04-15 DIAGNOSIS — N3281 Overactive bladder: Secondary | ICD-10-CM | POA: Diagnosis not present

## 2020-04-25 ENCOUNTER — Other Ambulatory Visit: Payer: Self-pay

## 2020-04-25 ENCOUNTER — Emergency Department (HOSPITAL_COMMUNITY)
Admission: EM | Admit: 2020-04-25 | Discharge: 2020-04-25 | Disposition: A | Discharge status: Home / Self Care | Payer: PPO | Attending: Emergency Medicine | Admitting: Emergency Medicine

## 2020-04-25 ENCOUNTER — Encounter (HOSPITAL_COMMUNITY): Payer: Self-pay

## 2020-04-25 DIAGNOSIS — Z5321 Procedure and treatment not carried out due to patient leaving prior to being seen by health care provider: Secondary | ICD-10-CM | POA: Insufficient documentation

## 2020-04-25 DIAGNOSIS — R531 Weakness: Secondary | ICD-10-CM | POA: Diagnosis not present

## 2020-04-25 LAB — COMPREHENSIVE METABOLIC PANEL
ALT: 33 U/L (ref 0–44)
AST: 29 U/L (ref 15–41)
Albumin: 4.3 g/dL (ref 3.5–5.0)
Alkaline Phosphatase: 48 U/L (ref 38–126)
Anion gap: 12 (ref 5–15)
BUN: 14 mg/dL (ref 8–23)
CO2: 24 mmol/L (ref 22–32)
Calcium: 10 mg/dL (ref 8.9–10.3)
Chloride: 103 mmol/L (ref 98–111)
Creatinine, Ser: 0.95 mg/dL (ref 0.44–1.00)
GFR calc Af Amer: >60 mL/min (ref >60)
GFR calc non Af Amer: >60 mL/min (ref >60)
Glucose, Bld: 101 mg/dL — ABNORMAL HIGH (ref 70–99)
Potassium: 3.4 mmol/L — ABNORMAL LOW (ref 3.5–5.1)
Sodium: 139 mmol/L (ref 135–145)
Total Bilirubin: 0.7 mg/dL (ref 0.3–1.2)
Total Protein: 7.3 g/dL (ref 6.5–8.1)

## 2020-04-25 LAB — CBC
HCT: 43.1 % (ref 36.0–46.0)
Hemoglobin: 13.9 g/dL (ref 12.0–15.0)
MCH: 29.1 pg (ref 26.0–34.0)
MCHC: 32.3 g/dL (ref 30.0–36.0)
MCV: 90.2 fL (ref 80.0–100.0)
Platelets: 248 10*3/uL (ref 150–400)
RBC: 4.78 MIL/uL (ref 3.87–5.11)
RDW: 12.6 % (ref 11.5–15.5)
WBC: 8.6 10*3/uL (ref 4.0–10.5)
nRBC: 0 % (ref 0.0–0.2)

## 2020-04-25 LAB — LIPASE, BLOOD: Lipase: 29 U/L (ref 11–51)

## 2020-04-25 LAB — TROPONIN I (HIGH SENSITIVITY): Troponin I (High Sensitivity): <2 ng/L (ref <18)

## 2020-04-25 LAB — LACTIC ACID, PLASMA: Lactic Acid, Venous: 1.4 mmol/L (ref 0.5–1.9)

## 2020-04-25 NOTE — ED Notes (Signed)
Pt notified staff that she is leaving due to wait time.

## 2020-04-25 NOTE — ED Triage Notes (Signed)
Pt arrives POV for eval of generalized weakness, abd pain, chest pain and malaise. Pt reports that she was diagnosed w/ a UTI 1.5 weeks ago and completed a course of abx for that, but she reports no relief of UTI sx. Pt reports increased fatigue, loss of bowel control, frequency/urgency, rash to bottom which was treated w/ fungal cream but still present.

## 2020-04-26 ENCOUNTER — Ambulatory Visit (INDEPENDENT_AMBULATORY_CARE_PROVIDER_SITE_OTHER): Payer: PPO | Admitting: Internal Medicine

## 2020-04-26 ENCOUNTER — Encounter: Payer: Self-pay | Admitting: Internal Medicine

## 2020-04-26 DIAGNOSIS — R059 Cough, unspecified: Secondary | ICD-10-CM

## 2020-04-26 DIAGNOSIS — R42 Dizziness and giddiness: Secondary | ICD-10-CM

## 2020-04-26 DIAGNOSIS — G4489 Other headache syndrome: Secondary | ICD-10-CM | POA: Diagnosis not present

## 2020-04-26 DIAGNOSIS — R05 Cough: Secondary | ICD-10-CM

## 2020-04-26 DIAGNOSIS — R5383 Other fatigue: Secondary | ICD-10-CM | POA: Diagnosis not present

## 2020-04-26 DIAGNOSIS — R519 Headache, unspecified: Secondary | ICD-10-CM | POA: Insufficient documentation

## 2020-04-26 NOTE — Assessment & Plan Note (Signed)
Chronic, intermittent ? Worse recently This is chronic in nature  Advised f/u with neurology

## 2020-04-26 NOTE — Assessment & Plan Note (Addendum)
Chronic, intermittent ? Worse recently This is chronic in nature  Advised f/u with neurology Discussed low BP may be contributing  -- advised discussing with cardio - metoprolol if for Afib prevention
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
## 2020-04-26 NOTE — Assessment & Plan Note (Signed)
She states fatigue, not feeling well and several other symptoms Cbc, cmp, ekg from ED yesterday WNL Most of her symptoms are chronic in nature - ? Possibly slightly worse I am not able to say why she is not feeling well, but her blood work and ekg are reassuring  I do not think anything acute is going on - possibly worsening of some of her chronic symptoms Did advise f/u with cardio - BP low and metoprolol may be contributing F/u with neuro

## 2020-04-26 NOTE — Patient Instructions (Addendum)
Follow up with cardiology to discuss the metoprolol.  Follow up with neurology.    Your blood work and EKG looked good from yesterday.

## 2020-04-26 NOTE — Assessment & Plan Note (Signed)
Chronic - maybe slightly worse Likely related to PND, allergies

## 2020-04-28 DIAGNOSIS — M4120 Other idiopathic scoliosis, site unspecified: Secondary | ICD-10-CM | POA: Diagnosis not present

## 2020-04-28 DIAGNOSIS — S069X0A Unspecified intracranial injury without loss of consciousness, initial encounter: Secondary | ICD-10-CM | POA: Diagnosis not present

## 2020-05-04 ENCOUNTER — Ambulatory Visit: Payer: PPO | Admitting: Gastroenterology

## 2020-05-04 ENCOUNTER — Encounter: Payer: Self-pay | Admitting: Gastroenterology

## 2020-05-04 VITALS — BP 112/78 | HR 76 | Ht 66.0 in | Wt 165.0 lb

## 2020-05-04 DIAGNOSIS — K219 Gastro-esophageal reflux disease without esophagitis: Secondary | ICD-10-CM

## 2020-05-04 DIAGNOSIS — Z800 Family history of malignant neoplasm of digestive organs: Secondary | ICD-10-CM | POA: Diagnosis not present

## 2020-05-04 DIAGNOSIS — R194 Change in bowel habit: Secondary | ICD-10-CM | POA: Diagnosis not present

## 2020-05-04 MED ORDER — SUTAB 1479-225-188 MG PO TABS: 1.0000 | ORAL_TABLET | Freq: Once | ORAL | 0 refills | Status: AC

## 2020-05-04 MED ORDER — CITRUCEL PO POWD
1.0000 | Freq: Every day | ORAL | Status: DC
Start: 2020-05-04 — End: 2022-04-06

## 2020-05-04 MED ORDER — FAMOTIDINE 20 MG PO TABS
20.0000 mg | ORAL_TABLET | Freq: Every day | ORAL | 3 refills | Status: DC
Start: 2020-05-04 — End: 2021-06-02

## 2020-05-04 NOTE — Patient Instructions (Addendum)
If you are age 69 or older, your body mass index should be between 23-30. Your Body mass index is 26.63 kg/m. If this is out of the aforementioned range listed, please consider follow up with your Primary Care Provider.  If you are age 71 or younger, your body mass index should be between 19-25. Your Body mass index is 26.63 kg/m. If this is out of the aformentioned range listed, please consider follow up with your Primary Care Provider.   You have been scheduled for a colonoscopy. Please follow written instructions given to you at your visit today.  Please pick up your prep supplies at the pharmacy within the next 1-3 days. If you use inhalers (even only as needed), please bring them with you on the day of your procedure.  Please purchase the following medications over the counter and take as directed: Citrucel - take once daily  We have sent the following medications to your pharmacy for you to pick up at your convenience: Pepcid (famotidine) 20 mg: Take once daily  Thank you for entrusting me with your care and for choosing [facility], Dr. [place]
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
## 2020-05-12 ENCOUNTER — Other Ambulatory Visit: Payer: Self-pay

## 2020-05-12 ENCOUNTER — Ambulatory Visit: Payer: PPO | Admitting: Allergy

## 2020-05-12 ENCOUNTER — Encounter: Payer: Self-pay | Admitting: Allergy

## 2020-05-12 VITALS — BP 100/70 | HR 81 | Temp 97.6°F | Resp 16

## 2020-05-12 DIAGNOSIS — J302 Other seasonal allergic rhinitis: Secondary | ICD-10-CM

## 2020-05-12 DIAGNOSIS — J0181 Other acute recurrent sinusitis: Secondary | ICD-10-CM

## 2020-05-12 DIAGNOSIS — L309 Dermatitis, unspecified: Secondary | ICD-10-CM

## 2020-05-12 DIAGNOSIS — J3089 Other allergic rhinitis: Secondary | ICD-10-CM

## 2020-05-12 DIAGNOSIS — J4530 Mild persistent asthma, uncomplicated: Secondary | ICD-10-CM | POA: Diagnosis not present

## 2020-05-12 MED ORDER — BUDESONIDE-FORMOTEROL FUMARATE 160-4.5 MCG/ACT IN AERO
2.0000 | INHALATION_SPRAY | Freq: Two times a day (BID) | RESPIRATORY_TRACT | 5 refills | Status: DC
Start: 2020-05-12 — End: 2020-12-22

## 2020-05-12 NOTE — Patient Instructions (Addendum)
Mild persistent asthma - Daily controller medication(s): stop Flovent. Symbicort 175mcg 2 puffs twice a day. Use with spacer. - Prior to physical activity: albuterol 2 puffs 10-15 minutes before physical activity. - Rescue medications: albuterol 4 puffs every 4-6 hours as needed - Asthma control goals:  * Full participation in all desired activities (may need albuterol before activity) * Albuterol use two time or less a week on average (not counting use with activity) * Cough interfering with sleep two time or less a month * Oral steroids no more than once a year * No hospitalizations  Seasonal and perennial allergic rhinitis (grasses, indoor molds and outdoor molds) - For worsening nasal congestion use XHANCE nasal spray device.  This nasal spray device contains fluticasone at a higher dosage than what is found in Flonase.  It also allows for deeper deposition of the medicated spray deeper into her sinuses for better effect.  She can take this 2 sprays each nostril twice a day at this time. - Can continue with nasal ipratropium one spray per nostril every 8 hours as needed for nasal drainage - Continue Claritin daily. Can take take additional dose if needed  Chronic sinusitis - consider sinus CT to evaluate for any anatomical abnormalities if continues to have infections  Rash - can use Eucrisa sample on spot on let that his dry, itchy  Return in about 3-69months. This can be an in-person, a virtual Webex or a telephone follow up visit.

## 2020-05-12 NOTE — Progress Notes (Signed)
Follow-up Note  RE: Kara Ramirez MRN: [id number] DOB: 02/14/1951 Date of Office Visit: 05/12/2020   History of present illness: Kara Ramirez is a 69 y.o. female presenting today for follow-up of asthma, allergic rhinitis and history of rhinosinusitis.  Her last visit was a telemedicine visit on 03/16/2020 by myself.  She states around the beginning of the month she noticed a rash on her bottom that then led into a bladder infection and she reports using an antifungal combination cream on the rash that did clear up.  After this she felt weak and fatigued.  She also reports chest tightness that she did use her albuterol for.  With all of her constellation of symptoms she called her PCP who triaged her and advised that she should go to the ED for an EKG.  She did do this on 04/25/2020 and she had labs done that were negative troponins, normal lactic acid, CBC and CMP were largely unremarkable and lipase was normal.  She states she asked how much longer she needed to wait before the EKG will be performed and she states they told her it was going to be a 6 to 8-hour wait.  She states she could not do this that she left.  She did see her PCP eventually for this and had a normal EKG.  She states she was advised that her symptoms were likely related to her allergies and asthma.  She is still taking Flovent 110 mcg 1 puff twice a day with a spacer.  In regards to her allergy symptoms she does use the [facility] nasal spray device which she states is helpful for congestion control.  She also has access to ipratropium for nasal drainage control.  She does do Claritin once a day.  She has not required any antibiotics for a sinusitis since her last visit. She also mentions that she has this red itchy spot on her leg that is not going away.  Review of systems: Review of Systems  Constitutional: Positive for malaise/fatigue.  HENT: Negative.   Eyes: Negative.   Respiratory:       See HPI  Cardiovascular:  Negative.   Gastrointestinal: Negative.   Musculoskeletal: Negative.   Skin: Positive for itching and rash.  Neurological: Negative.     All other systems negative unless noted above in HPI  Past medical/social/surgical/family history have been reviewed and are unchanged unless specifically indicated below.  No changes  Medication List: Current Outpatient Medications  Medication Sig Dispense Refill  . acetaminophen (TYLENOL) 325 MG tablet Take 1-2 tablets (325-650 mg total) by mouth every 4 (four) hours as needed for mild pain. (Patient taking differently: Take 325 mg by mouth every 4 (four) hours as needed for mild pain or headache. )    . albuterol (PROAIR HFA) 108 (90 Base) MCG/ACT inhaler INHALE 2 PUFFS INTO THE LUNGS EVERY 4 HOURS AS NEEDED FOR COUGH OR WHEEZE. MAY USE 2 PUFFS 10 TO 20 MINUTES PRIOR TO EXERCISE 8.5 each 0  . aspirin-acetaminophen-caffeine (EXCEDRIN MIGRAINE) 250-250-65 MG tablet Take 1 tablet by mouth every 6 (six) hours as needed for headache.    . Calcium Carbonate-Vitamin D (CALCIUM 600+D) 600-400 MG-UNIT per tablet Take 1 tablet by mouth daily.     . Carboxymethylcellul-Glycerin (REFRESH OPTIVE) 1-0.9 % GEL Place 1 application into both eyes at bedtime.     . cholecalciferol (VITAMIN D) 1000 UNITS tablet Take 1,000 Units by mouth daily.      [name] Co-Enzyme Q-10  100 MG CAPS Take 100 mg by mouth daily.     . famotidine (PEPCID) 20 MG tablet Take 1 tablet (20 mg total) by mouth daily. 90 tablet 3  . fluocinonide ointment (LIDEX) 5.68 % Apply 1 application topically See admin instructions. Apply topically twice daily for 5 days alternating with Tacrolimus ointment    . fluticasone (FLONASE) 50 MCG/ACT nasal spray USE 1 SPRAY IN EACH NOSTRIL MID-DAY FOR CONGESTION (Patient taking differently: Place 1 spray into both nostrils daily. ) 16 g 4  . fluticasone (FLOVENT HFA) 110 MCG/ACT inhaler Inhale 2 puffs into the lungs 2 (two) times daily. 1 Inhaler 1  . ibandronate  (BONIVA) 150 MG tablet Take 150 mg by mouth every 30 (thirty) days. Take in the morning with a full glass of water, on an empty stomach, and do not take anything else by mouth or lie down for the next 30 min.    [name] ipratropium (ATROVENT) 0.03 % nasal spray Place 2 sprays into both nostrils every 4 (four) hours as needed for rhinitis. 30 mL 5  . loratadine (CLARITIN) 10 MG tablet Take 10 mg by mouth daily.    [name] MALIC ACID PO Take 1 capsule by mouth at bedtime.    . methylcellulose (CITRUCEL) oral powder Take 1 packet by mouth daily.    . metoprolol tartrate (LOPRESSOR) 25 MG tablet TAKE 1/2 TABLET BY MOUTH TWICE A DAY 90 tablet 0  . mirabegron ER (MYRBETRIQ) 50 MG TB24 tablet Take 50 mg by mouth daily as needed.     . naproxen sodium (ALEVE) 220 MG tablet Take 220 mg by mouth daily as needed.     . Olopatadine HCl 0.2 % SOLN Apply to eye 2 (two) times daily as needed.    . Omega-3 Fatty Acids (FISH OIL) 1000 MG CAPS Take 1,000 mg by mouth at bedtime.     . polyethylene glycol (MIRALAX) 17 g packet Take 17 g by mouth daily as needed. Increase number of doses as needed 14 each 0  . pravastatin (PRAVACHOL) 10 MG tablet TAKE 1 TABLET BY MOUTH FOUR DAYS PER WEEK 48 tablet 0  . senna (SENOKOT) 8.6 MG tablet Take 1 tablet by mouth daily.    . sodium chloride (OCEAN) 0.65 % SOLN nasal spray Place 1 spray into both nostrils as needed for congestion. (Patient taking differently: Place 1 spray into both nostrils 4 (four) times daily as needed for congestion. )  0  . tacrolimus (PROTOPIC) 0.1 % ointment Apply 1 application topically 2 (two) times daily as needed (PRN skin issues). (Patient taking differently: Apply 1 application topically See admin instructions. Apply topically twice daily for 5 days alternating with Fluocinonide ointment) 100 g 0  . Thiamine HCl (VITAMIN B-1) 100 MG tablet Take 100 mg by mouth daily.      . benzonatate (TESSALON PERLES) 100 MG capsule Take 1 capsule (100 mg total) by mouth 3  (three) times daily as needed for cough. 30 capsule 0  . budesonide-formoterol (SYMBICORT) 160-4.5 MCG/ACT inhaler Inhale 2 puffs into the lungs 2 (two) times daily. Use with spacer 1 Inhaler 5   No current facility-administered medications for this visit.     Known medication allergies: Allergies  Allergen Reactions  . Mold Extract [Trichophyton Mentagrophyte] Shortness Of Breath and Rash  . Penicillins Shortness Of Breath, Rash and Other (See Comments)    Eyes puffy Has taken low dose pcn and no rx REACTION: rash, SOB Has patient had a PCN reaction  causing immediate rash, facial/tongue/throat swelling, SOB or lightheadedness with hypotension: yes Has patient had a PCN reaction causing severe rash involving mucus membranes or skin necrosis: unk Has patient had a PCN reaction that required hospitalization: no Has patient had a PCN reaction occurring within the last 10 years: unk If all of the above answers are "NO", then may proceed with Cephalospor  . Morphine Other (See Comments)    REACTION: tachycardia and anxiety  . Peanut Oil Nausea And Vomiting    Peanut butter  . Protonix [Pantoprazole Sodium] Nausea And Vomiting  . Citrus Other (See Comments)    Unknown  . Peanut-Containing Drug Products Other (See Comments)    Unknown  . Tramadol Other (See Comments)    Makes crazy;confused  . Valium [Diazepam] Other (See Comments)    Confusion per family  . Cetirizine Rash and Other (See Comments)    Around face  . Codeine Other (See Comments)    REACTION: dizzy and "groggy in my head"  . Eggs Or Egg-Derived Products Nausea And Vomiting  . Latex Itching  . Pentazocine Lactate Palpitations     Physical examination: Blood pressure 100/70, pulse 81, temperature 97.6 F (36.4 C), temperature source Temporal, resp. rate 16, SpO2 98 %.  General: Alert, interactive, in no acute distress. HEENT: PERRLA, TMs pearly gray, turbinates mildly edematous without discharge, post-pharynx non  erythematous. Neck: Supple without lymphadenopathy. Lungs: Clear to auscultation without wheezing, rhonchi or rales. {no increased work of breathing. CV: Normal S1, S2 without murmurs. Abdomen: Nondistended, nontender. Skin: Warm and dry, without lesions or rashes. Extremities:  No clubbing, cyanosis or edema. Neuro:   Grossly intact.  Diagnositics/Labs:  Spirometry: FEV1: 2.2L 93%, FVC: 2.87L 92%, ratio consistent with nonobstructive pattern  Assessment and plan:   Mild persistent asthma - Daily controller medication(s): stop Flovent. Symbicort 147mcg 2 puffs twice a day. Use with spacer. - Prior to physical activity: albuterol 2 puffs 10-15 minutes before physical activity. - Rescue medications: albuterol 4 puffs every 4-6 hours as needed - Asthma control goals:  * Full participation in all desired activities (may need albuterol before activity) * Albuterol use two time or less a week on average (not counting use with activity) * Cough interfering with sleep two time or less a month * Oral steroids no more than once a year * No hospitalizations  Seasonal and perennial allergic rhinitis (grasses, indoor molds and outdoor molds) - For worsening nasal congestion use XHANCE nasal spray device.  This nasal spray device contains fluticasone at a higher dosage than what is found in Flonase.  It also allows for deeper deposition of the medicated spray deeper into her sinuses for better effect.  She can take this 2 sprays each nostril twice a day at this time. - Can continue with nasal ipratropium one spray per nostril every 8 hours as needed for nasal drainage - Continue Claritin daily. Can take take additional dose if needed  Chronic sinusitis - consider sinus CT to evaluate for any anatomical abnormalities if continues to have infections  Rash - can use Eucrisa sample on spot on let that his dry, itchy  Return in about 3-55months. This can be an in-person, a virtual Webex or a  telephone follow up visit.  I appreciate the opportunity to take part in Aarian's care. Please do not hesitate to contact me with questions.  Sincerely,   Prudy Feeler, MD Allergy/Immunology [facility] [place]

## 2020-05-12 NOTE — Progress Notes (Addendum)
Assessment/Plan:   1.  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  No movements seen today.  -Discussed patient that we could certainly try clonazepam on Keppra.  Pt would like to try something.  We decided on keppra.  She can let us know if she has any mood issues.  R/B/SE were discussed.  The opportunity to ask questions was given and they were answered to the best of my ability.  The patient expressed understanding and willingness to follow the outlined treatment protocols.  2.  Gait ataxia  -use walker at all times  -recommended repeating PT.  3.  F/u 6 months.   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  My previous records as well as any outside records available were reviewed prior to todays visit.  We started her on Depakote last visit and the patient found it helpful for the myoclonus.  She, however, interestingly found that it made her irritable and upset.  She stated that it also caused shakiness but "I still feel somewhat shaky."  States that sometimes the shoulder will jerk and sometimes it will not.     PREVIOUS MEDICATIONS: VPA (helped myoclonus but pt thought caused mood issues)  CURRENT MEDICATIONS:  Outpatient Encounter Medications as of 05/17/2020  Medication Sig  . acetaminophen (TYLENOL) 325 MG tablet Take 1-2 tablets (325-650 mg total) by mouth every 4 (four) hours as needed for mild pain. (Patient taking differently: Take 325 mg by mouth every 4 (four) hours as needed for mild pain or headache. )  . albuterol (PROAIR HFA) 108 (90 Base) MCG/ACT inhaler INHALE 2 PUFFS INTO THE LUNGS EVERY 4 HOURS AS NEEDED FOR COUGH OR WHEEZE. MAY USE 2 PUFFS 10 TO 20 MINUTES PRIOR TO EXERCISE  . aspirin-acetaminophen-caffeine (EXCEDRIN MIGRAINE) 250-250-65 MG  tablet Take 1 tablet by mouth every 6 (six) hours as needed for headache.  . benzonatate (TESSALON PERLES) 100 MG capsule Take 1 capsule (100 mg total) by mouth 3 (three) times daily as needed for cough.  . budesonide-formoterol (SYMBICORT) 160-4.5 MCG/ACT inhaler Inhale 2 puffs into the lungs 2 (two) times daily. Use with spacer  . Calcium Carbonate-Vitamin D (CALCIUM 600+D) 600-400 MG-UNIT per tablet Take 1 tablet by mouth daily.   . Carboxymethylcellul-Glycerin (REFRESH OPTIVE) 1-0.9 % GEL Place 1 application into both eyes at bedtime.   . cholecalciferol (VITAMIN D) 1000 UNITS tablet Take 1,000 Units by mouth daily.    [name] Co-Enzyme Q-10 100 MG CAPS Take 100 mg by mouth daily.   . famotidine (PEPCID) 20 MG tablet Take 1 tablet (20 mg total) by mouth daily.  . fluocinonide ointment (LIDEX) 2.68 % Apply 1 application topically See admin instructions. Apply topically twice daily for 5 days alternating with Tacrolimus ointment  . fluticasone (FLONASE) 50 MCG/ACT nasal spray USE 1 SPRAY IN EACH NOSTRIL MID-DAY FOR CONGESTION (Patient taking differently: Place 1 spray into both nostrils daily. )  . fluticasone (FLOVENT HFA) 110 MCG/ACT inhaler Inhale 2 puffs into the lungs 2 (two) times daily.  [name] ibandronate (BONIVA) 150 MG tablet Take 150 mg by mouth every 30 (thirty) days. Take in the morning with a full glass of water, on an empty stomach, and do not take anything else by mouth or lie down for the next 30  min.  . ipratropium (ATROVENT) 0.03 % nasal spray Place 2 sprays into both nostrils every 4 (four) hours as needed for rhinitis.  [name] loratadine (CLARITIN) 10 MG tablet Take 10 mg by mouth daily.  [name] MALIC ACID PO Take 1 capsule by mouth at bedtime.  . methylcellulose (CITRUCEL) oral powder Take 1 packet by mouth daily.  . metoprolol tartrate (LOPRESSOR) 25 MG tablet TAKE 1/2 TABLET BY MOUTH TWICE A DAY  . mirabegron ER (MYRBETRIQ) 50 MG TB24 tablet Take 50 mg by mouth daily as needed.   . naproxen  sodium (ALEVE) 220 MG tablet Take 220 mg by mouth daily as needed.   . Olopatadine HCl 0.2 % SOLN Apply to eye 2 (two) times daily as needed.  . Omega-3 Fatty Acids (FISH OIL) 1000 MG CAPS Take 1,000 mg by mouth at bedtime.   . pravastatin (PRAVACHOL) 10 MG tablet TAKE 1 TABLET BY MOUTH FOUR DAYS PER WEEK  . senna (SENOKOT) 8.6 MG tablet Take 1 tablet by mouth daily.  . sodium chloride (OCEAN) 0.65 % SOLN nasal spray Place 1 spray into both nostrils as needed for congestion. (Patient taking differently: Place 1 spray into both nostrils 4 (four) times daily as needed for congestion. )  . tacrolimus (PROTOPIC) 0.1 % ointment Apply 1 application topically 2 (two) times daily as needed (PRN skin issues). (Patient taking differently: Apply 1 application topically See admin instructions. Apply topically twice daily for 5 days alternating with Fluocinonide ointment)  . Thiamine HCl (VITAMIN B-1) 100 MG tablet Take 100 mg by mouth daily.    . [DISCONTINUED] polyethylene glycol (MIRALAX) 17 g packet Take 17 g by mouth daily as needed. Increase number of doses as needed (Patient not taking: Reported on 05/17/2020)   No facility-administered encounter medications on file as of 05/17/2020.     Objective:   PHYSICAL EXAMINATION:    VITALS:   Vitals:   05/17/20 1052  BP: 112/84  Pulse: 75  SpO2: 97%  Weight: 166 lb (75.3 kg)  Height: 5\' 6"  (1.676 m)    GEN:  The patient appears stated age and is in NAD. HEENT:  Normocephalic, atraumatic.  The mucous membranes are moist. The superficial temporal arteries are without ropiness or tenderness. CV:  RRR Lungs:  CTAB Neck/HEME:  There are no carotid bruits bilaterally.  Neurological examination:  Orientation: The patient is alert and oriented x3. Cranial nerves: There is good facial symmetry.The speech is fluent and clear. Soft palate rises symmetrically and there is no tongue deviation. Hearing is intact to conversational tone. Sensation: Sensation  is intact to light touch throughout Motor: Strength is at least antigravity x4.  Movement examination: Tone: There is normal tone in the UE/LE Abnormal movements:  no tremor.  No myoclonus.  No asterixis.   Coordination:  There is slow with RAMs but no real decremation.  She talks with her hands but then is very slow when doing RAMs Gait and Station: The patient is slow and tenuous with ambulation    Cc:  Burns, Claudina Lick, MD

## 2020-05-17 ENCOUNTER — Ambulatory Visit: Payer: PPO | Admitting: Neurology

## 2020-05-17 ENCOUNTER — Other Ambulatory Visit: Payer: Self-pay

## 2020-05-17 ENCOUNTER — Encounter: Payer: Self-pay | Admitting: Neurology

## 2020-05-17 VITALS — BP 112/84 | HR 75 | Ht 66.0 in | Wt 166.0 lb

## 2020-05-17 DIAGNOSIS — G253 Myoclonus: Secondary | ICD-10-CM | POA: Diagnosis not present

## 2020-05-17 DIAGNOSIS — S069X9S Unspecified intracranial injury with loss of consciousness of unspecified duration, sequela: Secondary | ICD-10-CM | POA: Diagnosis not present

## 2020-05-17 MED ORDER — LEVETIRACETAM 250 MG PO TABS: 250.0000 mg | ORAL_TABLET | Freq: Two times a day (BID) | ORAL | 5 refills | Status: DC

## 2020-05-17 NOTE — Patient Instructions (Signed)
1.  Start keppra 250 mg - 1 tablet twice per day.  Let us know if you have any issues  The physicians and staff at [facility] are committed to providing excellent care. You may receive a survey requesting feedback about your experience at our office. We strive to receive "very good" responses to the survey questions. If you feel that your experience would prevent you from giving the office a "very good " response, please contact our office to try to remedy the situation. We may be reached at [phone]. Thank you for taking the time out of your busy day to complete the survey.

## 2020-05-24 ENCOUNTER — Encounter: Payer: Self-pay | Admitting: Gastroenterology

## 2020-06-07 ENCOUNTER — Other Ambulatory Visit: Payer: Self-pay

## 2020-06-07 ENCOUNTER — Encounter: Payer: Self-pay | Admitting: Gastroenterology

## 2020-06-07 ENCOUNTER — Ambulatory Visit (AMBULATORY_SURGERY_CENTER): Payer: PPO | Admitting: Gastroenterology

## 2020-06-07 VITALS — BP 127/73 | HR 66 | Temp 98.4°F | Resp 18 | Ht 66.0 in | Wt 165.0 lb

## 2020-06-07 DIAGNOSIS — K5730 Diverticulosis of large intestine without perforation or abscess without bleeding: Secondary | ICD-10-CM | POA: Diagnosis not present

## 2020-06-07 DIAGNOSIS — K52832 Lymphocytic colitis: Secondary | ICD-10-CM

## 2020-06-07 DIAGNOSIS — R194 Change in bowel habit: Secondary | ICD-10-CM | POA: Diagnosis not present

## 2020-06-07 DIAGNOSIS — Z800 Family history of malignant neoplasm of digestive organs: Secondary | ICD-10-CM

## 2020-06-07 DIAGNOSIS — D128 Benign neoplasm of rectum: Secondary | ICD-10-CM

## 2020-06-07 DIAGNOSIS — K621 Rectal polyp: Secondary | ICD-10-CM | POA: Diagnosis not present

## 2020-06-07 DIAGNOSIS — D123 Benign neoplasm of transverse colon: Secondary | ICD-10-CM

## 2020-06-07 DIAGNOSIS — K635 Polyp of colon: Secondary | ICD-10-CM

## 2020-06-07 MED ORDER — SODIUM CHLORIDE 0.9 % IV SOLN: 500.0000 mL | Freq: Once | INTRAVENOUS | Status: DC

## 2020-06-07 NOTE — Patient Instructions (Signed)
Handout on polyps and diverticulosis given to you today  Await pathology on polyps removed   Await pathology on biopsies done today  Continue trial of daily fiber if that has helped   YOU HAD AN ENDOSCOPIC PROCEDURE TODAY AT [facility]:   Refer to the procedure report that was given to you for any specific questions about what was found during the examination.  If the procedure report does not answer your questions, please call your gastroenterologist to clarify.  If you requested that your care partner not be given the details of your procedure findings, then the procedure report has been included in a sealed envelope for you to review at your convenience later.  YOU SHOULD EXPECT: Some feelings of bloating in the abdomen. Passage of more gas than usual.  Walking can help get rid of the air that was put into your GI tract during the procedure and reduce the bloating. If you had a lower endoscopy (such as a colonoscopy or flexible sigmoidoscopy) you may notice spotting of blood in your stool or on the toilet paper. If you underwent a bowel prep for your procedure, you may not have a normal bowel movement for a few days.  Please Note:  You might notice some irritation and congestion in your nose or some drainage.  This is from the oxygen used during your procedure.  There is no need for concern and it should clear up in a day or so.  SYMPTOMS TO REPORT IMMEDIATELY:   Following lower endoscopy (colonoscopy or flexible sigmoidoscopy):  Excessive amounts of blood in the stool  Significant tenderness or worsening of abdominal pains  Swelling of the abdomen that is new, acute  Fever of 100F or higher    For urgent or emergent issues, a gastroenterologist can be reached at any hour by calling [phone]. Do not use MyChart messaging for urgent concerns.    DIET:  We do recommend a small meal at first, but then you may proceed to your regular diet.  Drink plenty of  fluids but you should avoid alcoholic beverages for 24 hours.  ACTIVITY:  You should plan to take it easy for the rest of today and you should NOT DRIVE or use heavy machinery until tomorrow (because of the sedation medicines used during the test).    FOLLOW UP: Our staff will call the number listed on your records 48-72 hours following your procedure to check on you and address any questions or concerns that you may have regarding the information given to you following your procedure. If we do not reach you, we will leave a message.  We will attempt to reach you two times.  During this call, we will ask if you have developed any symptoms of COVID 19. If you develop any symptoms (ie: fever, flu-like symptoms, shortness of breath, cough etc.) before then, please call [phone].  If you test positive for Covid 19 in the 2 weeks post procedure, please call and report this information to [place].    If any biopsies were taken you will be contacted by phone or by letter within the next 1-3 weeks.  Please call us at [phone] if you have not heard about the biopsies in 3 weeks.    SIGNATURES/CONFIDENTIALITY: You and/or your care partner have signed paperwork which will be entered into your electronic medical record.  These signatures attest to the fact that that the information above on your After Visit Summary has been reviewed  and is understood.  Full responsibility of the confidentiality of this discharge information lies with you and/or your care-partner.

## 2020-06-07 NOTE — Op Note (Signed)
[place] Patient Name: Kara Ramirez Procedure Date: 06/07/2020 4:06 PM MRN: [id number] Endoscopist: Remo Lipps P. Havery Moros , MD Age: 69 Referring MD:  Date of Birth: 1951-10-01 Gender: Female Account #: [id number] Procedure:                Colonoscopy Indications:              Family history of colon cancer in a first-degree                            relative before age 43 years (mother age 27s),                            recent alternating bowel habits from constipation                            to loose stools (previously chronic constipation) Medicines:                Monitored Anesthesia Care Procedure:                Pre-Anesthesia Assessment:                           - Prior to the procedure, a History and Physical                            was performed, and patient medications and                            allergies were reviewed. The patient's tolerance of                            previous anesthesia was also reviewed. The risks                            and benefits of the procedure and the sedation                            options and risks were discussed with the patient.                            All questions were answered, and informed consent                            was obtained. Prior Anticoagulants: The patient has                            taken no previous anticoagulant or antiplatelet                            agents. ASA Grade Assessment: III - A patient with                            severe systemic disease. After reviewing the risks  and benefits, the patient was deemed in                            satisfactory condition to undergo the procedure.                           After obtaining informed consent, the colonoscope                            was passed under direct vision. Throughout the                            procedure, the patient's blood pressure, pulse, and                            oxygen  saturations were monitored continuously. The                            Colonoscope was introduced through the anus and                            advanced to the the terminal ileum, with                            identification of the appendiceal orifice and IC                            valve. The colonoscopy was performed without                            difficulty. The patient tolerated the procedure                            well. The quality of the bowel preparation was                            good. The terminal ileum, ileocecal valve,                            appendiceal orifice, and rectum were photographed. Scope In: 4:17:04 PM Scope Out: 4:42:30 PM Scope Withdrawal Time: 0 hours 21 minutes 41 seconds  Total Procedure Duration: 0 hours 25 minutes 26 seconds  Findings:                 The perianal and digital rectal examinations were                            normal.                           The terminal ileum appeared normal.                           A 7 mm polyp was found in the hepatic flexure. The  polyp was flat. The polyp was removed with a cold                            snare. Resection and retrieval were complete.                           A 3 mm polyp was found in the rectum. The polyp was                            sessile. The polyp was removed with a cold snare.                            Resection and retrieval were complete.                           Multiple small-mouthed diverticula were found in                            the sigmoid colon.                           Anal papilla(e) were hypertrophied.                           The exam was otherwise without abnormality.                           Biopsies for histology were taken with a cold                            forceps from the right colon, left colon and                            transverse colon for evaluation of microscopic                             colitis. Complications:            No immediate complications. Estimated blood loss:                            Minimal. Estimated Blood Loss:     Estimated blood loss was minimal. Impression:               - The examined portion of the ileum was normal.                           - One 7 mm polyp at the hepatic flexure, removed                            with a cold snare. Resected and retrieved.                           - One 3 mm polyp in the rectum, removed with a cold  snare. Resected and retrieved.                           - Diverticulosis in the sigmoid colon.                           - Anal papilla(e) were hypertrophied.                           - The examination was otherwise normal.                           - Biopsies were taken with a cold forceps from the                            right colon, left colon and transverse colon for                            evaluation of microscopic colitis. Recommendation:           - Patient has a contact number available for                            emergencies. The signs and symptoms of potential                            delayed complications were discussed with the                            patient. Return to normal activities tomorrow.                            Written discharge instructions were provided to the                            patient.                           - Resume previous diet.                           - Continue present medications.                           - Continue with trial of daily fiber                            supplementation if that has helped symptoms                           - Await pathology results. Remo Lipps P. Havery Moros, MD 06/07/2020 4:48:23 PM This report has been signed electronically.

## 2020-06-07 NOTE — Progress Notes (Signed)
pt tolerated well. VSS. awake and to recovery. Report given to RN.  

## 2020-06-07 NOTE — Progress Notes (Signed)
Called to room to assist during endoscopic procedure.  Patient ID and intended procedure confirmed with present staff. Received instructions for my participation in the procedure from the performing physician.  

## 2020-06-07 NOTE — Progress Notes (Signed)
V/s-cw  Check-in-MG

## 2020-06-09 ENCOUNTER — Telehealth: Payer: Self-pay

## 2020-06-09 NOTE — Telephone Encounter (Signed)
°  Follow up Call-  Call back number 06/07/2020  Post procedure Call Back phone  # [phone]  Permission to leave phone message Yes  Some recent data might be hidden     Patient questions:  Do you have a fever, pain , or abdominal swelling? No. Pain Score  0 *  Have you tolerated food without any problems? Yes.    Have you been able to return to your normal activities? Yes.    Do you have any questions about your discharge instructions: Diet   No. Medications  No. Follow up visit  No.  Do you have questions or concerns about your Care? No.  Actions: * If pain score is 4 or above: No action needed, pain <4.  1. Have you developed a fever since your procedure? no  2.   Have you had an respiratory symptoms (SOB or cough) since your procedure? no  3.   Have you tested positive for COVID 19 since your procedure no  4.   Have you had any family members/close contacts diagnosed with the COVID 19 since your procedure?  no   If yes to any of these questions please route to Joylene John, RN and Erenest Rasher, RN
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
## 2020-06-12 NOTE — Patient Instructions (Addendum)
  Medications reviewed and updated.  Changes include :   Triamcinolone cream to rash on leg twice daily until gone   Your prescription(s) have been submitted to your pharmacy. Please take as directed and contact our office if you believe you are having problem(s) with the medication(s).     Please followup in 1 year, sooner if needed

## 2020-06-13 ENCOUNTER — Encounter: Payer: Self-pay | Admitting: Internal Medicine

## 2020-06-13 ENCOUNTER — Ambulatory Visit (INDEPENDENT_AMBULATORY_CARE_PROVIDER_SITE_OTHER): Payer: PPO | Admitting: Internal Medicine

## 2020-06-13 ENCOUNTER — Other Ambulatory Visit: Payer: Self-pay

## 2020-06-13 DIAGNOSIS — L300 Nummular dermatitis: Secondary | ICD-10-CM | POA: Diagnosis not present

## 2020-06-13 DIAGNOSIS — R7303 Prediabetes: Secondary | ICD-10-CM | POA: Diagnosis not present

## 2020-06-13 DIAGNOSIS — E782 Mixed hyperlipidemia: Secondary | ICD-10-CM | POA: Diagnosis not present

## 2020-06-13 MED ORDER — TRIAMCINOLONE ACETONIDE 0.5 % EX OINT: 1.0000 "application " | TOPICAL_OINTMENT | Freq: Two times a day (BID) | CUTANEOUS | 0 refills | Status: AC

## 2020-06-13 NOTE — Assessment & Plan Note (Signed)
Chronic Sugars have been controlled - will chekc a1c at her next visit Low sugar / carb diet Stressed regular exercise

## 2020-06-13 NOTE — Assessment & Plan Note (Signed)
Chronic Cholesterol above goal but limited by side effects from statin  Continue daily statin Regular exercise as tolerated and healthy diet encouraged

## 2020-06-13 NOTE — Assessment & Plan Note (Signed)
Acute  Left lower lateral leg Has been putting on eucrisa - will some improvement, but still there Will try triamcinolone cream BID

## 2020-06-14 ENCOUNTER — Other Ambulatory Visit: Payer: Self-pay

## 2020-06-14 NOTE — Progress Notes (Signed)
Budesonide

## 2020-06-28 ENCOUNTER — Ambulatory Visit: Payer: PPO | Admitting: Cardiovascular Disease

## 2020-06-28 ENCOUNTER — Other Ambulatory Visit: Payer: Self-pay

## 2020-06-28 ENCOUNTER — Encounter: Payer: Self-pay | Admitting: Cardiovascular Disease

## 2020-06-28 VITALS — BP 120/74 | HR 81 | Ht 66.0 in | Wt 162.6 lb

## 2020-06-28 DIAGNOSIS — E782 Mixed hyperlipidemia: Secondary | ICD-10-CM | POA: Diagnosis not present

## 2020-06-28 DIAGNOSIS — I480 Paroxysmal atrial fibrillation: Secondary | ICD-10-CM

## 2020-06-28 MED ORDER — PRAVASTATIN SODIUM 40 MG PO TABS: 40.0000 mg | ORAL_TABLET | Freq: Every day | ORAL | 3 refills | Status: DC

## 2020-06-28 NOTE — Patient Instructions (Addendum)
Medication Instructions:  INCREASE pravastatin to 40 mg daily  *If you need a refill on your cardiac medications before your next appointment, please call your pharmacy*   Lab Work: RETURN IN 3 MONTHS FOR FASTING LABWORK (Lipid/Hepatic)  If you have labs (blood work) drawn today and your tests are completely normal, you will receive your results only by: [name] MyChart Message (if you have MyChart) OR . A paper copy in the mail If you have any lab test that is abnormal or we need to change your treatment, we will call you to review the results.  Follow-Up: At [facility], you and your health needs are our priority.  As part of our continuing mission to provide you with exceptional heart care, we have created designated Provider Care Teams.  These Care Teams include your primary Cardiologist (physician) and Advanced Practice Providers (APPs -  Physician Assistants and Nurse Practitioners) who all work together to provide you with the care you need, when you need it.  We recommend signing up for the patient portal called "MyChart".  Sign up information is provided on this After Visit Summary.  MyChart is used to connect with patients for Virtual Visits (Telemedicine).  Patients are able to view lab/test results, encounter notes, upcoming appointments, etc.  Non-urgent messages can be sent to your provider as well.   To learn more about what you can do with MyChart, go to [email or web address].    Your next appointment:   12 month(s)  The format for your next appointment:   In Person  Provider:   Quay Burow, MD      Heart-Healthy Eating Plan Many factors influence your heart (coronary) health, including eating and exercise habits. Coronary risk increases with abnormal blood fat (lipid) levels. Heart-healthy meal planning includes limiting unhealthy fats, increasing healthy fats, and making other diet and lifestyle changes.  What are tips for following this plan? Cooking Cook  foods using methods other than frying. Baking, boiling, grilling, and broiling are all good options. Other ways to reduce fat include:  Removing the skin from poultry.  Removing all visible fats from meats.  Steaming vegetables in water or broth. Meal planning   At meals, imagine dividing your plate into fourths: ? Fill one-half of your plate with vegetables and green salads. ? Fill one-fourth of your plate with whole grains. ? Fill one-fourth of your plate with lean protein foods.  Eat 4-5 servings of vegetables per day. One serving equals 1 cup raw or cooked vegetable, or 2 cups raw leafy greens.  Eat 4-5 servings of fruit per day. One serving equals 1 medium whole fruit,  cup dried fruit,  cup fresh, frozen, or canned fruit, or  cup 100% fruit juice.  Eat more foods that contain soluble fiber. Examples include apples, broccoli, carrots, beans, peas, and barley. Aim to get 25-30 g of fiber per day.  Increase your consumption of legumes, nuts, and seeds to 4-5 servings per week. One serving of dried beans or legumes equals  cup cooked, 1 serving of nuts is  cup, and 1 serving of seeds equals 1 tablespoon. Fats  Choose healthy fats more often. Choose monounsaturated and polyunsaturated fats, such as olive and canola oils, flaxseeds, walnuts, almonds, and seeds.  Eat more omega-3 fats. Choose salmon, mackerel, sardines, tuna, flaxseed oil, and ground flaxseeds. Aim to eat fish at least 2 times each week.  Check food labels carefully to identify foods with trans fats or high amounts of saturated fat.  Limit  saturated fats. These are found in animal products, such as meats, butter, and cream. Plant sources of saturated fats include palm oil, palm kernel oil, and coconut oil.  Avoid foods with partially hydrogenated oils in them. These contain trans fats. Examples are stick margarine, some tub margarines, cookies, crackers, and other baked goods.  Avoid fried foods. General  information  Eat more home-cooked food and less restaurant, buffet, and fast food.  Limit or avoid alcohol.  Limit foods that are high in starch and sugar.  Lose weight if you are overweight. Losing just 5-10% of your body weight can help your overall health and prevent diseases such as diabetes and heart disease.  Monitor your salt (sodium) intake, especially if you have high blood pressure. Talk with your health care provider about your sodium intake.  Try to incorporate more vegetarian meals weekly. What foods can I eat? Fruits All fresh, canned (in natural juice), or frozen fruits. Vegetables Fresh or frozen vegetables (raw, steamed, roasted, or grilled). Green salads. Grains Most grains. Choose whole wheat and whole grains most of the time. Rice and pasta, including brown rice and pastas made with whole wheat. Meats and other proteins Lean, well-trimmed beef, veal, pork, and lamb. Chicken and [place] without skin. All fish and shellfish. Wild duck, rabbit, pheasant, and venison. Egg whites or low-cholesterol egg substitutes. Dried beans, peas, lentils, and tofu. Seeds and most nuts. Dairy Low-fat or nonfat cheeses, including ricotta and mozzarella. Skim or 1% milk (liquid, powdered, or evaporated). Buttermilk made with low-fat milk. Nonfat or low-fat yogurt. Fats and oils Non-hydrogenated (trans-free) margarines. Vegetable oils, including soybean, sesame, sunflower, olive, peanut, safflower, corn, canola, and cottonseed. Salad dressings or mayonnaise made with a vegetable oil. Beverages Water (mineral or sparkling). Coffee and tea. Diet carbonated beverages. Sweets and desserts Sherbet, gelatin, and fruit ice. Small amounts of dark chocolate. Limit all sweets and desserts. Seasonings and condiments All seasonings and condiments. The items listed above may not be a complete list of foods and beverages you can eat. Contact a dietitian for more options. What foods are not  recommended? Fruits Canned fruit in heavy syrup. Fruit in cream or butter sauce. Fried fruit. Limit coconut. Vegetables Vegetables cooked in cheese, cream, or butter sauce. Fried vegetables. Grains Breads made with saturated or trans fats, oils, or whole milk. Croissants. Sweet rolls. Donuts. High-fat crackers, such as cheese crackers. Meats and other proteins Fatty meats, such as hot dogs, ribs, sausage, bacon, rib-eye roast or steak. High-fat deli meats, such as salami and bologna. Caviar. Domestic duck and goose. Organ meats, such as liver. Dairy Cream, sour cream, cream cheese, and creamed cottage cheese. Whole milk cheeses. Whole or 2% milk (liquid, evaporated, or condensed). Whole buttermilk. Cream sauce or high-fat cheese sauce. Whole-milk yogurt. Fats and oils Meat fat, or shortening. Cocoa butter, hydrogenated oils, palm oil, coconut oil, palm kernel oil. Solid fats and shortenings, including bacon fat, salt pork, lard, and butter. Nondairy cream substitutes. Salad dressings with cheese or sour cream. Beverages Regular sodas and any drinks with added sugar. Sweets and desserts Frosting. Pudding. Cookies. Cakes. Pies. Milk chocolate or white chocolate. Buttered syrups. Full-fat ice cream or ice cream drinks. The items listed above may not be a complete list of foods and beverages to avoid. Contact a dietitian for more information. Summary  Heart-healthy meal planning includes limiting unhealthy fats, increasing healthy fats, and making other diet and lifestyle changes.  Lose weight if you are overweight. Losing just 5-10% of your body  weight can help your overall health and prevent diseases such as diabetes and heart disease.  Focus on eating a balance of foods, including fruits and vegetables, low-fat or nonfat dairy, lean protein, nuts and legumes, whole grains, and heart-healthy oils and fats. This information is not intended to replace advice given to you by your health care  provider. Make sure you discuss any questions you have with your health care provider. Document Revised: 12/13/2017 Document Reviewed: 12/13/2017 Elsevier Patient Education  2020 [employer].

## 2020-06-28 NOTE — Assessment & Plan Note (Signed)
History of occasional atypical chest pain with coronary CTA performed 12/10/2017 revealing a coronary calcium score of 125 with minimal CAD.

## 2020-06-28 NOTE — Assessment & Plan Note (Signed)
History of hyperlipidemia on Pravachol 20 mg a day 4 days a week with a recent lipid profile performed 12/14/2019 revealing 10 cholesterol 228, LDL of 133 and HDL of 58 not at goal for secondary prevention.  She does have mild CAD by coronary CTA.  I am going to increase her Pravachol to 40 mg daily and we will recheck a lipid liver profile in 3 months

## 2020-06-28 NOTE — Progress Notes (Signed)
06/28/2020 DENIM START   [birth year]  [id number]  Primary Physician Binnie Rail, MD Primary Cardiologist: Lorretta Harp MD [name], [place]  HPI:  Kara Ramirez is a 69 y.o.  mildly overweight married Caucasian female mother of 2, grandmother of 1 grandchild whose husbandDavidis also patient mild isaccompanyingher today. She is retired from working in Press [employer]. I last saw her in the office  06/23/2019.Her primary care physician is Dr. Billey Gosling. I last saw her in the office 05/27/15.She really has no risk factors other than mild hyperlipidemia on red yeast rice. Her sister did have myocardial infarctions. She's had chest pain off and on for a year which is somewhat atypical. It occurs typically last for minutes at a time with occasional associated shortness of breath.Performed 2-D echocardiography and Myoview stress testing July 2016 which were normal. She was recently seen at [facility] in September with chest pain and shortness of breath. A dobutamine echo was normal. She saw [facility] in [facility] 09/02/17 who ordered an event monitor that did show some brief runs of PAF.  She did have a VATS procedure by Dr. Roxan Hockey revealing a mass which was nonmalignant.  Coronary CTA performed 12/10/2017 revealing a coronary calcium score 125 with minimal CAD.    Since I saw her a year ago she is done well.  She gets occasional atypical chest pain.  Her most recent lipid profile performed 12/14/2019 revealed total cholesterol of 228, LDL of 133 and HDL of 58.    Current Meds  Medication Sig  . acetaminophen (TYLENOL) 325 MG tablet Take 1-2 tablets (325-650 mg total) by mouth every 4 (four) hours as needed for mild pain. (Patient taking differently: Take 325 mg by mouth every 4 (four) hours as needed for mild pain or headache. )  . albuterol (PROAIR HFA) 108 (90 Base) MCG/ACT inhaler INHALE 2 PUFFS INTO THE LUNGS EVERY 4 HOURS AS NEEDED FOR COUGH OR WHEEZE. MAY  USE 2 PUFFS 10 TO 20 MINUTES PRIOR TO EXERCISE  . aspirin-acetaminophen-caffeine (EXCEDRIN MIGRAINE) 250-250-65 MG tablet Take 1 tablet by mouth every 6 (six) hours as needed for headache.  . benzonatate (TESSALON PERLES) 100 MG capsule Take 1 capsule (100 mg total) by mouth 3 (three) times daily as needed for cough.  . budesonide-formoterol (SYMBICORT) 160-4.5 MCG/ACT inhaler Inhale 2 puffs into the lungs 2 (two) times daily. Use with spacer  . Calcium Carbonate-Vitamin D (CALCIUM 600+D) 600-400 MG-UNIT per tablet Take 1 tablet by mouth daily.   . Carboxymethylcellul-Glycerin (REFRESH OPTIVE) 1-0.9 % GEL Place 1 application into both eyes at bedtime.   . cholecalciferol (VITAMIN D) 1000 UNITS tablet Take 1,000 Units by mouth daily.    [name] Co-Enzyme Q-10 100 MG CAPS Take 100 mg by mouth daily.   . famotidine (PEPCID) 20 MG tablet Take 1 tablet (20 mg total) by mouth daily.  . fluocinonide ointment (LIDEX) 6.96 % Apply 1 application topically See admin instructions. Apply topically twice daily for 5 days alternating with Tacrolimus ointment  . fluticasone (FLONASE) 50 MCG/ACT nasal spray USE 1 SPRAY IN EACH NOSTRIL MID-DAY FOR CONGESTION (Patient taking differently: Place 1 spray into both nostrils daily. )  . fluticasone (FLOVENT HFA) 110 MCG/ACT inhaler Inhale 2 puffs into the lungs 2 (two) times daily.  [name] ibandronate (BONIVA) 150 MG tablet Take 150 mg by mouth every 30 (thirty) days. Take in the morning with a full glass of water, on an empty stomach, and do  not take anything else by mouth or lie down for the next 30 min.  [name] ipratropium (ATROVENT) 0.03 % nasal spray Place 2 sprays into both nostrils every 4 (four) hours as needed for rhinitis.  [name] levETIRAcetam (KEPPRA) 250 MG tablet Take 1 tablet (250 mg total) by mouth 2 (two) times daily.  [name] loratadine (CLARITIN) 10 MG tablet Take 10 mg by mouth daily.  [name] MALIC ACID PO Take 1 capsule by mouth at bedtime.  . methylcellulose (CITRUCEL) oral powder  Take 1 packet by mouth daily.  . metoprolol tartrate (LOPRESSOR) 25 MG tablet TAKE 1/2 TABLET BY MOUTH TWICE A DAY  . mirabegron ER (MYRBETRIQ) 50 MG TB24 tablet Take 50 mg by mouth daily as needed.   . naproxen sodium (ALEVE) 220 MG tablet Take 220 mg by mouth daily as needed.   . Olopatadine HCl 0.2 % SOLN Apply to eye 2 (two) times daily as needed.  . Omega-3 Fatty Acids (FISH OIL) 1000 MG CAPS Take 1,000 mg by mouth at bedtime.   . pravastatin (PRAVACHOL) 40 MG tablet Take 1 tablet (40 mg total) by mouth daily.  [name] senna (SENOKOT) 8.6 MG tablet Take 1 tablet by mouth daily.  . sodium chloride (OCEAN) 0.65 % SOLN nasal spray Place 1 spray into both nostrils as needed for congestion. (Patient taking differently: Place 1 spray into both nostrils 4 (four) times daily as needed for congestion. )  . tacrolimus (PROTOPIC) 0.1 % ointment Apply 1 application topically 2 (two) times daily as needed (PRN skin issues). (Patient taking differently: Apply 1 application topically See admin instructions. Apply topically twice daily for 5 days alternating with Fluocinonide ointment)  . Thiamine HCl (VITAMIN B-1) 100 MG tablet Take 100 mg by mouth daily.    [name] triamcinolone ointment (KENALOG) 0.5 % Apply 1 application topically 2 (two) times daily. For rash on left lower leg  . [DISCONTINUED] pravastatin (PRAVACHOL) 10 MG tablet TAKE 1 TABLET BY MOUTH FOUR DAYS PER WEEK     Allergies  Allergen Reactions  . Mold Extract [Trichophyton Mentagrophyte] Shortness Of Breath and Rash  . Penicillins Shortness Of Breath, Rash and Other (See Comments)    Eyes puffy Has taken low dose pcn and no rx REACTION: rash, SOB Has patient had a PCN reaction causing immediate rash, facial/tongue/throat swelling, SOB or lightheadedness with hypotension: yes Has patient had a PCN reaction causing severe rash involving mucus membranes or skin necrosis: unk Has patient had a PCN reaction that required hospitalization: no Has patient  had a PCN reaction occurring within the last 10 years: unk If all of the above answers are "NO", then may proceed with Cephalospor  . Morphine Other (See Comments)    REACTION: tachycardia and anxiety  . Peanut Oil Nausea And Vomiting    Peanut butter  . Protonix [Pantoprazole Sodium] Nausea And Vomiting  . Citrus Other (See Comments)    Unknown  . Peanut-Containing Drug Products Other (See Comments)    Unknown  . Tramadol Other (See Comments)    Makes crazy;confused  . Valium [Diazepam] Other (See Comments)    Confusion per family  . Cetirizine Rash and Other (See Comments)    Around face  . Codeine Other (See Comments)    REACTION: dizzy and "groggy in my head"  . Eggs Or Egg-Derived Products Nausea And Vomiting  . Latex Itching  . Pentazocine Lactate Palpitations    Social History   Socioeconomic History  . Marital status: Married  Spouse name: Not on file  . Number of children: 2  . Years of education: Not on file  . Highest education level: Not on file  Occupational History  . Occupation: retired    Fish farm manager: [employer]  Tobacco Use  . Smoking status: Never Smoker  . Smokeless tobacco: Never Used  Vaping Use  . Vaping Use: Never used  Substance and Sexual Activity  . Alcohol use: Never    Alcohol/week: 0.0 standard drinks  . Drug use: Never  . Sexual activity: Not Currently  Other Topics Concern  . Not on file  Social History Narrative   Right handed   One story home   Slight caffeine   Social Determinants of Health   Financial Resource Strain:   . Difficulty of Paying Living Expenses:   Food Insecurity:   . Worried About Charity fundraiser in the Last Year:   . Arboriculturist in the Last Year:   Transportation Needs:   . Film/video editor (Medical):   [name] Lack of Transportation (Non-Medical):   Physical Activity:   . Days of Exercise per Week:   . Minutes of Exercise per Session:   Stress:   . Feeling of Stress :   Social  Connections:   . Frequency of Communication with Friends and Family:   . Frequency of Social Gatherings with Friends and Family:   . Attends Religious Services:   . Active Member of Clubs or Organizations:   . Attends Archivist Meetings:   [name] Marital Status:   Intimate Partner Violence:   . Fear of Current or Ex-Partner:   . Emotionally Abused:   [name] Physically Abused:   . Sexually Abused:      Review of Systems: General: negative for chills, fever, night sweats or weight changes.  Cardiovascular: negative for chest pain, dyspnea on exertion, edema, orthopnea, palpitations, paroxysmal nocturnal dyspnea or shortness of breath Dermatological: negative for rash Respiratory: negative for cough or wheezing Urologic: negative for hematuria Abdominal: negative for nausea, vomiting, diarrhea, bright red blood per rectum, melena, or hematemesis Neurologic: negative for visual changes, syncope, or dizziness All other systems reviewed and are otherwise negative except as noted above.    Blood pressure 120/74, pulse 81, height 5\' 6"  (1.676 m), weight 162 lb 9.6 oz (73.8 kg), SpO2 98 %.  General appearance: alert and no distress Neck: no adenopathy, no carotid bruit, no JVD, supple, symmetrical, trachea midline and thyroid not enlarged, symmetric, no tenderness/mass/nodules Lungs: clear to auscultation bilaterally Heart: regular rate and rhythm, S1, S2 normal, no murmur, click, rub or gallop Extremities: extremities normal, atraumatic, no cyanosis or edema Pulses: 2+ and symmetric Skin: Skin color, texture, turgor normal. No rashes or lesions Neurologic: Alert and oriented X 3, normal strength and tone. Normal symmetric reflexes. Normal coordination and gait  EKG sinus rhythm at 81 with nonspecific ST and T wave changes.  I personally reviewed this EKG.  ASSESSMENT AND PLAN:   Hyperlipidemia History of hyperlipidemia on Pravachol 20 mg a day 4 days a week with a recent lipid  profile performed 12/14/2019 revealing 10 cholesterol 228, LDL of 133 and HDL of 58 not at goal for secondary prevention.  She does have mild CAD by coronary CTA.  I am going to increase her Pravachol to 40 mg daily and we will recheck a lipid liver profile in 3 months  Chest tightness History of occasional atypical chest pain with coronary CTA performed 12/10/2017 revealing a  coronary calcium score of 125 with minimal CAD.      Lorretta Harp MD FACP,FACC,FAHA, [facility] 06/28/2020 5:00 PM

## 2020-06-29 DIAGNOSIS — R8271 Bacteriuria: Secondary | ICD-10-CM | POA: Diagnosis not present

## 2020-06-29 DIAGNOSIS — R350 Frequency of micturition: Secondary | ICD-10-CM | POA: Diagnosis not present

## 2020-06-29 DIAGNOSIS — N3281 Overactive bladder: Secondary | ICD-10-CM | POA: Diagnosis not present

## 2020-07-15 ENCOUNTER — Telehealth: Payer: Self-pay | Admitting: Gastroenterology

## 2020-07-15 ENCOUNTER — Telehealth: Payer: Self-pay | Admitting: Internal Medicine

## 2020-07-15 NOTE — Telephone Encounter (Signed)
Left message for patient to call back  

## 2020-07-15 NOTE — Progress Notes (Signed)
°  Chronic Care Management   Note  07/15/2020 Name: LENNON RICHINS MRN: [id number] DOB: [birth year]  [name] is a 69 y.o. year old female who is a primary care patient of Burns, Claudina Lick, MD. I reached out to [name] by phone today in response to a referral sent by Ms. Phylliss Bob Mcerlean's PCP, Binnie Rail, MD.   Ms. Lindenberger was given information about Chronic Care Management services today including:  1. CCM service includes personalized support from designated clinical staff supervised by her physician, including individualized plan of care and coordination with other care providers 2. 24/7 contact phone numbers for assistance for urgent and routine care needs. 3. Service will only be billed when office clinical staff spend 20 minutes or more in a month to coordinate care. 4. Only one practitioner may furnish and bill the service in a calendar month. 5. The patient may stop CCM services at any time (effective at the end of the month) by phone call to the office staff.   Patient agreed to services and verbal consent obtained.   Follow up plan:   Earney Hamburg [facility] Scheduler

## 2020-07-19 NOTE — Telephone Encounter (Signed)
No answer and no return call from patient.  I will await a return call

## 2020-07-20 DIAGNOSIS — R3915 Urgency of urination: Secondary | ICD-10-CM | POA: Diagnosis not present

## 2020-07-20 DIAGNOSIS — N3000 Acute cystitis without hematuria: Secondary | ICD-10-CM | POA: Diagnosis not present

## 2020-07-20 DIAGNOSIS — R350 Frequency of micturition: Secondary | ICD-10-CM | POA: Diagnosis not present

## 2020-08-10 ENCOUNTER — Encounter: Payer: Self-pay | Admitting: Internal Medicine

## 2020-08-10 NOTE — Progress Notes (Signed)
Outside notes received. Information abstracted. Notes sent to scan.  

## 2020-08-15 ENCOUNTER — Other Ambulatory Visit: Payer: Self-pay | Admitting: Cardiovascular Disease

## 2020-08-15 ENCOUNTER — Telehealth: Payer: Self-pay | Admitting: Gastroenterology

## 2020-08-15 NOTE — Telephone Encounter (Signed)
Spoke with patient she states that she thinks she has an anal fissure.  Pt states that she has been taking Citrucel and Senokot, pt states that she has been using Citrucel since the summer, started Senokot Saturday with no relief. Pt advised to try Miralax, 1 dose a day and Recticare OTC until upcoming appt on Thursday. Pt is aware that if she does have a fissure it may take some time to heal just due to the area.  Pt verbalized understanding.

## 2020-08-15 NOTE — Telephone Encounter (Signed)
Patient called states she is having acute rectal pain and is inquiring about a cream medication not sure which one she does not remember. Please advise

## 2020-08-18 ENCOUNTER — Ambulatory Visit (INDEPENDENT_AMBULATORY_CARE_PROVIDER_SITE_OTHER): Payer: PPO | Admitting: Gastroenterology

## 2020-08-18 ENCOUNTER — Encounter: Payer: Self-pay | Admitting: Gastroenterology

## 2020-08-18 ENCOUNTER — Other Ambulatory Visit: Payer: Self-pay | Admitting: Gastroenterology

## 2020-08-18 VITALS — BP 110/70 | HR 89 | Ht 65.0 in | Wt 162.0 lb

## 2020-08-18 DIAGNOSIS — Z86010 Personal history of colonic polyps: Secondary | ICD-10-CM

## 2020-08-18 DIAGNOSIS — K5900 Constipation, unspecified: Secondary | ICD-10-CM | POA: Diagnosis not present

## 2020-08-18 DIAGNOSIS — K602 Anal fissure, unspecified: Secondary | ICD-10-CM | POA: Diagnosis not present

## 2020-08-18 DIAGNOSIS — K52832 Lymphocytic colitis: Secondary | ICD-10-CM | POA: Diagnosis not present

## 2020-08-18 MED ORDER — AMBULATORY NON FORMULARY MEDICATION: 1 refills | Status: DC

## 2020-08-18 NOTE — Patient Instructions (Signed)
If you are age 70 or older, your body mass index should be between 23-30. Your Body mass index is 26.96 kg/m. If this is out of the aforementioned range listed, please consider follow up with your Primary Care Provider.  If you are age 21 or younger, your body mass index should be between 19-25. Your Body mass index is 26.96 kg/m. If this is out of the aformentioned range listed, please consider follow up with your Primary Care Provider.   ____________________________________________________________________________  We have sent a prescription for nitroglycerin 0.125% gel to [facility]. You should apply a pea size amount to your rectum three times daily for 4 to 6 weeks  [facility] information is below: Address: [address] [place], [place] [address]  [phone]  *Please DO NOT go directly from our office to pick up this medication! Give the pharmacy 1 day to process the prescription as this is compounded and takes time to make.  ______________________________________________________________________________  Take a double dose of Miralax today and then another double dose in the morning.  If you need to, you can take more in the afternoon.  Once your bowels are moving, take Miralax once to twice daily as needed.   Discontinue taking stool softeners.   Thank you for entrusting me with your care and for choosing [facility], Dr. [place] Cellar
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
## 2020-08-22 ENCOUNTER — Telehealth: Payer: Self-pay | Admitting: Gastroenterology

## 2020-08-22 NOTE — Telephone Encounter (Signed)
Spoke with patient, she states that today around 11:30 am her bowels have finally moved. Pt states that she started the Nitroglycerin ointment on 08/18/20. Pt advised that she should continue Miralax as directed, increase fluids, pt advised to continue Nitroglycerin ointment as directed. Patient is aware that it can take anywhere from 4-6 weeks for the fissure to heal. Pt advised to use wipes instead of toilet paper, advised to pat the area for vigorously rubbing it. Pt verbalized understanding of all instructions and will give [place] a call if she has any other cocerns

## 2020-08-22 NOTE — Telephone Encounter (Signed)
Patient called states she finally had a BM after 3 weeks wanted to follow up with her status after the medications recommended.

## 2020-08-23 DIAGNOSIS — N3281 Overactive bladder: Secondary | ICD-10-CM | POA: Diagnosis not present

## 2020-08-23 DIAGNOSIS — N3000 Acute cystitis without hematuria: Secondary | ICD-10-CM | POA: Diagnosis not present

## 2020-08-31 ENCOUNTER — Other Ambulatory Visit: Payer: Self-pay

## 2020-08-31 DIAGNOSIS — E782 Mixed hyperlipidemia: Secondary | ICD-10-CM

## 2020-08-31 LAB — LIPID PANEL
Chol/HDL Ratio: 3.3 ratio (ref 0.0–4.4)
Cholesterol, Total: 202 mg/dL — ABNORMAL HIGH (ref 100–199)
HDL: 61 mg/dL (ref >39)
LDL Chol Calc (NIH): 119 mg/dL — ABNORMAL HIGH (ref 0–99)
Triglycerides: 127 mg/dL (ref 0–149)
VLDL Cholesterol Cal: 22 mg/dL (ref 5–40)

## 2020-08-31 LAB — HEPATIC FUNCTION PANEL
ALT: 24 IU/L (ref 0–32)
AST: 25 IU/L (ref 0–40)
Albumin: 4.7 g/dL (ref 3.8–4.8)
Alkaline Phosphatase: 57 IU/L (ref 44–121)
Bilirubin Total: 0.4 mg/dL (ref 0.0–1.2)
Bilirubin, Direct: 0.12 mg/dL (ref 0.00–0.40)
Total Protein: 7 g/dL (ref 6.0–8.5)

## 2020-09-01 ENCOUNTER — Ambulatory Visit: Payer: PPO | Admitting: Pharmacist

## 2020-09-01 ENCOUNTER — Other Ambulatory Visit: Payer: Self-pay

## 2020-09-01 DIAGNOSIS — E042 Nontoxic multinodular goiter: Secondary | ICD-10-CM

## 2020-09-01 DIAGNOSIS — I480 Paroxysmal atrial fibrillation: Secondary | ICD-10-CM

## 2020-09-01 DIAGNOSIS — R253 Fasciculation: Secondary | ICD-10-CM

## 2020-09-01 DIAGNOSIS — E782 Mixed hyperlipidemia: Secondary | ICD-10-CM
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
## 2020-09-01 NOTE — Patient Instructions (Addendum)
Visit Information  Phone number for Pharmacist: [phone]  Thank you for meeting with me to discuss your medications! I look forward to working with you to achieve your health care goals. Below is a summary of what we talked about during the visit:  Goals Addressed            This Visit's Progress   . Pharmacy Care Plan       CARE PLAN ENTRY (see longitudinal plan of care for additional care plan information)  Current Barriers:  . Chronic Disease Management support, education, and care coordination needs related to Hyperlipidemia, Atrial Fibrillation, and Asthma, Myoclonus   Atrial fibrillation Pulse Readings from Last 3 Encounters:  08/18/20 89  06/28/20 81  06/13/20 75   BP Readings from Last 3 Encounters:  08/18/20 110/70  06/28/20 120/74  06/13/20 108/74 .  Pharmacist Clinical Goal(s): o Over the next 30 days, patient will work with PharmD and providers to maintain normal sinus rhythm  . Current regimen:  o Metoprolol tartrate 25 mg - 1/2 tab BID . Interventions: o Discussed benefits of checking BP and HR at home to monitor for low blood pressure and heart rate . Patient self care activities - Over the next 30 days, patient will: o Check BP weekly, document, and provide at future appointments o Ensure daily salt intake < 2300 mg/day  Hyperlipidemia Lab Results  Component Value Date/Time   LDLCALC 119 (H) 08/31/2020 10:13 AM .  Pharmacist Clinical Goal(s): o Over the next 30 days, patient will work with PharmD and providers to achieve LDL goal < 70 . Current regimen:  o Pravastatin 40 mg daily . Interventions: o Discussed cholesterol goals and benefits of medications for prevention of heart attack / stroke o Recommend switch to rosuvastatin 10 mg or adding ezetimibe to achieve goal - pt to follow up with cardiologist regarding most recent lipid panel . Patient self care activities - Over the next 30 days, patient will: o Follow up with cardiologist as  needed  Asthma . Pharmacist Clinical Goal(s) o Over the next 30 days, patient will work with PharmD and providers to optimize therapy . Current regimen:  o Albuterol HFA prn o Symbicort 160-4.5 mcg/act 2 puffs twice a day using spacer . Interventions: o Discussed role of Symbicort in asthma and goal to reduce need for rescue inhaler o Advised to follow up with allergist . Patient self care activities - Over the next 30 days, patient will: o Follow up with allergist regarding inhaler therapy  Myoclonus . Pharmacist Clinical Goal(s) o Over the next 30 days, patient will work with PharmD and providers to optimize therapy . Current regimen:  o No medications . Interventions: o Discussed other options for myoclonus including clonazepam; advised to follow up with neurologist regarding next steps . Patient self care activities - Over the next 30 days, patient will: o Follow up with neurologist to discuss next treatment  Medication management . Pharmacist Clinical Goal(s): o Over the next 30 days, patient will work with PharmD and providers to maintain optimal medication adherence . [facility] . Interventions o Comprehensive medication review performed. o Continue current medication management strategy . Patient self care activities - Over the next 30 days, patient will: o Focus on medication adherence by fill date o Take medications as prescribed o Report any questions or concerns to PharmD and/or provider(s)  Initial goal documentation      Kara Ramirez was given information about Chronic Care Management services today  including:  1. CCM service includes personalized support from designated clinical staff supervised by her physician, including individualized plan of care and coordination with other care providers 2. 24/7 contact phone numbers for assistance for urgent and routine care needs. 3. Standard insurance, coinsurance, copays and deductibles apply for  chronic care management only during months in which we provide at least 20 minutes of these services. Most insurances cover these services at 100%, however patients may be responsible for any copay, coinsurance and/or deductible if applicable. This service may help you avoid the need for more expensive face-to-face services. 4. Only one practitioner may furnish and bill the service in a calendar month. 5. The patient may stop CCM services at any time (effective at the end of the month) by phone call to the office staff.  Patient agreed to services and verbal consent obtained.   Patient verbalizes understanding of instructions provided today.  Telephone follow up appointment with pharmacy team member scheduled for: 1 month  Charlene Brooke, PharmD, BCACP Clinical Pharmacist [facility] [phone]  Cholesterol Content in Foods Cholesterol is a waxy, fat-like substance that helps to carry fat in the blood. The body needs cholesterol in small amounts, but too much cholesterol can cause damage to the arteries and heart. Most people should eat less than 200 milligrams (mg) of cholesterol a day. Foods with cholesterol  Cholesterol is found in animal-based foods, such as meat, seafood, and dairy. Generally, low-fat dairy and lean meats have less cholesterol than full-fat dairy and fatty meats. The milligrams of cholesterol per serving (mg per serving) of common cholesterol-containing foods are listed below. Meat and other proteins  Egg -- one large whole egg has 186 mg.  Veal shank -- 4 oz has 141 mg.  Lean ground [place] (93% lean) -- 4 oz has 118 mg.  Fat-trimmed lamb loin -- 4 oz has 106 mg.  Lean ground beef (90% lean) -- 4 oz has 100 mg.  Lobster -- 3.5 oz has 90 mg.  Pork loin chops -- 4 oz has 86 mg.  Canned salmon -- 3.5 oz has 83 mg.  Fat-trimmed beef top loin -- 4 oz has 78 mg.  Frankfurter -- 1 frank (3.5 oz) has 77 mg.  Crab -- 3.5 oz has 71  mg.  Roasted chicken without skin, white meat -- 4 oz has 66 mg.  Light bologna -- 2 oz has 45 mg.  Deli-cut [place] -- 2 oz has 31 mg.  Canned tuna -- 3.5 oz has 31 mg.  Berniece Salines -- 1 oz has 29 mg.  Oysters and mussels (raw) -- 3.5 oz has 25 mg.  Mackerel -- 1 oz has 22 mg.  Trout -- 1 oz has 20 mg.  Pork sausage -- 1 link (1 oz) has 17 mg.  Salmon -- 1 oz has 16 mg.  Tilapia -- 1 oz has 14 mg. Dairy  Soft-serve ice cream --  cup (4 oz) has 103 mg.  Whole-milk yogurt -- 1 cup (8 oz) has 29 mg.  Cheddar cheese -- 1 oz has 28 mg.  American cheese -- 1 oz has 28 mg.  Whole milk -- 1 cup (8 oz) has 23 mg.  2% milk -- 1 cup (8 oz) has 18 mg.  Cream cheese -- 1 tablespoon (Tbsp) has 15 mg.  Cottage cheese --  cup (4 oz) has 14 mg.  Low-fat (1%) milk -- 1 cup (8 oz) has 10 mg.  Sour cream -- 1 Tbsp  has 8.5 mg.  Low-fat yogurt -- 1 cup (8 oz) has 8 mg.  Nonfat Greek yogurt -- 1 cup (8 oz) has 7 mg.  Half-and-half cream -- 1 Tbsp has 5 mg. Fats and oils  Cod liver oil -- 1 tablespoon (Tbsp) has 82 mg.  Butter -- 1 Tbsp has 15 mg.  Lard -- 1 Tbsp has 14 mg.  Bacon grease -- 1 Tbsp has 14 mg.  Mayonnaise -- 1 Tbsp has 5-10 mg.  Margarine -- 1 Tbsp has 3-10 mg. Exact amounts of cholesterol in these foods may vary depending on specific ingredients and brands. Foods without cholesterol Most plant-based foods do not have cholesterol unless you combine them with a food that has cholesterol. Foods without cholesterol include:  Grains and cereals.  Vegetables.  Fruits.  Vegetable oils, such as olive, canola, and sunflower oil.  Legumes, such as peas, beans, and lentils.  Nuts and seeds.  Egg whites. Summary  The body needs cholesterol in small amounts, but too much cholesterol can cause damage to the arteries and heart.  Most people should eat less than 200 milligrams (mg) of cholesterol a day. This information is not intended to replace advice given  to you by your health care provider. Make sure you discuss any questions you have with your health care provider. Document Revised: 10/18/2017 Document Reviewed: 07/02/2017 Elsevier Patient Education  [place].

## 2020-09-02 ENCOUNTER — Ambulatory Visit (INDEPENDENT_AMBULATORY_CARE_PROVIDER_SITE_OTHER): Payer: PPO

## 2020-09-02 DIAGNOSIS — Z0000 Encounter for general adult medical examination without abnormal findings: Secondary | ICD-10-CM
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
## 2020-09-02 NOTE — Patient Instructions (Signed)
Kara Ramirez , Thank you for taking time to come for your Medicare Wellness Visit. I appreciate your ongoing commitment to your health goals. Please review the following plan we discussed and let me know if I can assist you in the future.   Screening recommendations/referrals: Colonoscopy: 06/07/2020; due every 5 years Mammogram: 11/16/2019 Bone Density: 11/06/2018 Recommended yearly ophthalmology/optometry visit for glaucoma screening and checkup Recommended yearly dental visit for hygiene and checkup  Vaccinations: Influenza vaccine: 12/14/2019 Pneumococcal vaccine: up to date Tdap vaccine: up to date Shingles vaccine: up to date   Covid-19: up to date  Advanced directives: Please bring a copy of your health care power of attorney and living will to the office at your convenience.  Conditions/risks identified: Yes; Reviewed health maintenance screenings with patient today and relevant education, vaccines, and/or referrals were provided. Please continue to do your personal lifestyle choices by: daily care of teeth and gums, regular physical activity (goal should be 5 days a week for 30 minutes), eat a healthy diet, avoid tobacco and drug use, limiting any alcohol intake, taking a low-dose aspirin (if not allergic or have been advised by your provider otherwise) and taking vitamins and minerals as recommended by your provider. Continue doing brain stimulating activities (puzzles, reading, adult coloring books, staying active) to keep memory sharp. Continue to eat heart healthy diet (full of fruits, vegetables, whole grains, lean protein, water--limit salt, fat, and sugar intake) and increase physical activity as tolerated.  Next appointment: Please schedule your next Medicare Wellness Visit with your Nurse Health Advisor in 1 year by calling [phone].   Preventive Care 70 Years and Older, Female Preventive care refers to lifestyle choices and visits with your health care provider that can  promote health and wellness. What does preventive care include?  A yearly physical exam. This is also called an annual well check.  Dental exams once or twice a year.  Routine eye exams. Ask your health care provider how often you should have your eyes checked.  Personal lifestyle choices, including:  Daily care of your teeth and gums.  Regular physical activity.  Eating a healthy diet.  Avoiding tobacco and drug use.  Limiting alcohol use.  Practicing safe sex.  Taking low-dose aspirin every day.  Taking vitamin and mineral supplements as recommended by your health care provider. What happens during an annual well check? The services and screenings done by your health care provider during your annual well check will depend on your age, overall health, lifestyle risk factors, and family history of disease. Counseling  Your health care provider may ask you questions about your:  Alcohol use.  Tobacco use.  Drug use.  Emotional well-being.  Home and relationship well-being.  Sexual activity.  Eating habits.  History of falls.  Memory and ability to understand (cognition).  Work and work Statistician.  Reproductive health. Screening  You may have the following tests or measurements:  Height, weight, and BMI.  Blood pressure.  Lipid and cholesterol levels. These may be checked every 5 years, or more frequently if you are over 26 years old.  Skin check.  Lung cancer screening. You may have this screening every year starting at age 8 if you have a 30-pack-year history of smoking and currently smoke or have quit within the past 15 years.  Fecal occult blood test (FOBT) of the stool. You may have this test every year starting at age 50.  Flexible sigmoidoscopy or colonoscopy. You may have a sigmoidoscopy every 5 years  or a colonoscopy every 10 years starting at age 38.  Hepatitis C blood test.  Hepatitis B blood test.  Sexually transmitted disease  (STD) testing.  Diabetes screening. This is done by checking your blood sugar (glucose) after you have not eaten for a while (fasting). You may have this done every 1-3 years.  Bone density scan. This is done to screen for osteoporosis. You may have this done starting at age 74.  Mammogram. This may be done every 1-2 years. Talk to your health care provider about how often you should have regular mammograms. Talk with your health care provider about your test results, treatment options, and if necessary, the need for more tests. Vaccines  Your health care provider may recommend certain vaccines, such as:  Influenza vaccine. This is recommended every year.  Tetanus, diphtheria, and acellular pertussis (Tdap, Td) vaccine. You may need a Td booster every 10 years.  Zoster vaccine. You may need this after age 14.  Pneumococcal 13-valent conjugate (PCV13) vaccine. One dose is recommended after age 24.  Pneumococcal polysaccharide (PPSV23) vaccine. One dose is recommended after age 54. Talk to your health care provider about which screenings and vaccines you need and how often you need them. This information is not intended to replace advice given to you by your health care provider. Make sure you discuss any questions you have with your health care provider. Document Released: 12/02/2015 Document Revised: 07/25/2016 Document Reviewed: 09/06/2015 Elsevier Interactive Patient Education  2017 [place] Prevention in the Home Falls can cause injuries. They can happen to people of all ages. There are many things you can do to make your home safe and to help prevent falls. What can I do on the outside of my home?  Regularly fix the edges of walkways and driveways and fix any cracks.  Remove anything that might make you trip as you walk through a door, such as a raised step or threshold.  Trim any bushes or trees on the path to your home.  Use bright outdoor lighting.  Clear any  walking paths of anything that might make someone trip, such as rocks or tools.  Regularly check to see if handrails are loose or broken. Make sure that both sides of any steps have handrails.  Any raised decks and porches should have guardrails on the edges.  Have any leaves, snow, or ice cleared regularly.  Use sand or salt on walking paths during winter.  Clean up any spills in your garage right away. This includes oil or grease spills. What can I do in the bathroom?  Use night lights.  Install grab bars by the toilet and in the tub and shower. Do not use towel bars as grab bars.  Use non-skid mats or decals in the tub or shower.  If you need to sit down in the shower, use a plastic, non-slip stool.  Keep the floor dry. Clean up any water that spills on the floor as soon as it happens.  Remove soap buildup in the tub or shower regularly.  Attach bath mats securely with double-sided non-slip rug tape.  Do not have throw rugs and other things on the floor that can make you trip. What can I do in the bedroom?  Use night lights.  Make sure that you have a light by your bed that is easy to reach.  Do not use any sheets or blankets that are too big for your bed. They should not hang down  onto the floor.  Have a firm chair that has side arms. You can use this for support while you get dressed.  Do not have throw rugs and other things on the floor that can make you trip. What can I do in the kitchen?  Clean up any spills right away.  Avoid walking on wet floors.  Keep items that you use a lot in easy-to-reach places.  If you need to reach something above you, use a strong step stool that has a grab bar.  Keep electrical cords out of the way.  Do not use floor polish or wax that makes floors slippery. If you must use wax, use non-skid floor wax.  Do not have throw rugs and other things on the floor that can make you trip. What can I do with my stairs?  Do not leave  any items on the stairs.  Make sure that there are handrails on both sides of the stairs and use them. Fix handrails that are broken or loose. Make sure that handrails are as long as the stairways.  Check any carpeting to make sure that it is firmly attached to the stairs. Fix any carpet that is loose or worn.  Avoid having throw rugs at the top or bottom of the stairs. If you do have throw rugs, attach them to the floor with carpet tape.  Make sure that you have a light switch at the top of the stairs and the bottom of the stairs. If you do not have them, ask someone to add them for you. What else can I do to help prevent falls?  Wear shoes that:  Do not have high heels.  Have rubber bottoms.  Are comfortable and fit you well.  Are closed at the toe. Do not wear sandals.  If you use a stepladder:  Make sure that it is fully opened. Do not climb a closed stepladder.  Make sure that both sides of the stepladder are locked into place.  Ask someone to hold it for you, if possible.  Clearly mark and make sure that you can see:  Any grab bars or handrails.  First and last steps.  Where the edge of each step is.  Use tools that help you move around (mobility aids) if they are needed. These include:  Canes.  Walkers.  Scooters.  Crutches.  Turn on the lights when you go into a dark area. Replace any light bulbs as soon as they burn out.  Set up your furniture so you have a clear path. Avoid moving your furniture around.  If any of your floors are uneven, fix them.  If there are any pets around you, be aware of where they are.  Review your medicines with your doctor. Some medicines can make you feel dizzy. This can increase your chance of falling. Ask your doctor what other things that you can do to help prevent falls. This information is not intended to replace advice given to you by your health care provider. Make sure you discuss any questions you have with your  health care provider. Document Released: 09/01/2009 Document Revised: 04/12/2016 Document Reviewed: 12/10/2014 Elsevier Interactive Patient Education  2017 [employer].

## 2020-09-03 ENCOUNTER — Ambulatory Visit: Payer: PPO | Attending: Internal Medicine

## 2020-09-03 DIAGNOSIS — Z23 Encounter for immunization: Secondary | ICD-10-CM

## 2020-09-03 NOTE — Progress Notes (Signed)
   Covid-19 Vaccination Clinic  Name:  Kara Ramirez    MRN: [id number] DOB: 17-Sep-1951  09/03/2020  Ms. Poss was observed post Covid-19 immunization for 15 minutes without incident. She was provided with Vaccine Information Sheet and instruction to access the V-Safe system.   [name] was instructed to call 911 with any severe reactions post vaccine: [name] Difficulty breathing  . Swelling of face and throat  . A fast heartbeat  . A bad rash all over body  . Dizziness and weakness

## 2020-09-07 ENCOUNTER — Other Ambulatory Visit: Payer: Self-pay

## 2020-09-07 MED ORDER — ROSUVASTATIN CALCIUM 10 MG PO TABS: 10.0000 mg | ORAL_TABLET | Freq: Every day | ORAL | 3 refills | Status: DC

## 2020-09-08 DIAGNOSIS — H532 Diplopia: Secondary | ICD-10-CM | POA: Diagnosis not present

## 2020-09-08 DIAGNOSIS — H01009 Unspecified blepharitis unspecified eye, unspecified eyelid: Secondary | ICD-10-CM | POA: Diagnosis not present

## 2020-09-08 DIAGNOSIS — S069X9A Unspecified intracranial injury with loss of consciousness of unspecified duration, initial encounter: Secondary | ICD-10-CM | POA: Diagnosis not present

## 2020-09-08 DIAGNOSIS — H4912 Fourth [trochlear] nerve palsy, left eye: Secondary | ICD-10-CM | POA: Diagnosis not present

## 2020-09-16 ENCOUNTER — Telehealth: Payer: Self-pay | Admitting: Cardiovascular Disease

## 2020-09-16 NOTE — Telephone Encounter (Signed)
Recommend discontinuing pravastatin since patient can not tolerate.  LDL is elevated however and she should need something for prevention.  Next step would be Repatha or Praluent.  If patient would like to start, would be happy to see at lipid clinic at [facility] or [place]

## 2020-09-16 NOTE — Telephone Encounter (Signed)
Left message to call back  

## 2020-09-16 NOTE — Telephone Encounter (Signed)
Patient is returning call.  °

## 2020-09-16 NOTE — Telephone Encounter (Signed)
Patient was not at goal LDL on pravastatin. Cannot atorvastatin or rosuvastatin.   Scheduled for lipid clinic visit with [facility] PharmD on 09/20/20 @ 1030am

## 2020-09-16 NOTE — Telephone Encounter (Signed)
Pt c/o medication issue:  1. Name of Medication: rosuvastatin (CRESTOR) 10 MG tablet  2. How are you currently taking this medication (dosage and times per day)? 10 mg daily   3. Are you having a reaction (difficulty breathing--STAT)?   4. What is your medication issue?Shaky, irritable and has some trouble walking. The pt was at [facility] yesterday and one of the employees was nervous about her and walked her to her ca. The patient has only been taking the medication for a week.

## 2020-09-20 ENCOUNTER — Other Ambulatory Visit: Payer: Self-pay

## 2020-09-20 ENCOUNTER — Ambulatory Visit: Payer: PPO | Admitting: Pharmacist Clinician (PhC)/ Clinical Pharmacy Specialist

## 2020-09-20 DIAGNOSIS — E782 Mixed hyperlipidemia: Secondary | ICD-10-CM | POA: Diagnosis not present

## 2020-09-20 MED ORDER — EZETIMIBE 10 MG PO TABS: 10.0000 mg | ORAL_TABLET | Freq: Every day | ORAL | 3 refills | Status: DC

## 2020-09-20 NOTE — Assessment & Plan Note (Signed)
Patient with elevated cholesterol, LDL goal of < 100.  Currently on pravastatin 40 mg and LDL at 119.  We will have her switch the pravastatin to bedtime to see if we can get further reduction.  Will also add ezetimibe 10 mg daily.  She will need to repeat labs in 3 months, we will send a reminder to her at the beginning of 2022.  Because she has no ASCVD or LDL > 190, she will not qualify for PCSK-9i or bempedoic acid at this time, however I feel that she will probably get to goal with just the pravastatin and ezetimibe.

## 2020-09-20 NOTE — Patient Instructions (Addendum)
Your Results:             Your most recent labs Goal  Total Cholesterol 202 < 200  Triglycerides 127 < 150  HDL (happy/good cholesterol) 61 > 40  LDL (lousy/bad cholesterol 119 < 100   Medication changes:  Start ezetimibe 10 mg once daily with a meal (morning or night)  Switch pravastatin 40 mg to bedtime  Try to take your magnesium around dinner time.    Lab orders:  We will repeat cholesterol labs in 3 months.  We will send a lab order to you in the mail in early January   Thank you for choosing [facility]   Cholesterol Content in Foods Cholesterol is a waxy, fat-like substance that helps to carry fat in the blood. The body needs cholesterol in small amounts, but too much cholesterol can cause damage to the arteries and heart. Most people should eat less than 200 milligrams (mg) of cholesterol a day. Foods with cholesterol  Cholesterol is found in animal-based foods, such as meat, seafood, and dairy. Generally, low-fat dairy and lean meats have less cholesterol than full-fat dairy and fatty meats. The milligrams of cholesterol per serving (mg per serving) of common cholesterol-containing foods are listed below. Meat and other proteins  Egg -- one large whole egg has 186 mg.  Veal shank -- 4 oz has 141 mg.  Lean ground [place] (93% lean) -- 4 oz has 118 mg.  Fat-trimmed lamb loin -- 4 oz has 106 mg.  Lean ground beef (90% lean) -- 4 oz has 100 mg.  Lobster -- 3.5 oz has 90 mg.  Pork loin chops -- 4 oz has 86 mg.  Canned salmon -- 3.5 oz has 83 mg.  Fat-trimmed beef top loin -- 4 oz has 78 mg.  Frankfurter -- 1 frank (3.5 oz) has 77 mg.  Crab -- 3.5 oz has 71 mg.  Roasted chicken without skin, white meat -- 4 oz has 66 mg.  Light bologna -- 2 oz has 45 mg.  Deli-cut [place] -- 2 oz has 31 mg.  Canned tuna -- 3.5 oz has 31 mg.  Berniece Salines -- 1 oz has 29 mg.  Oysters and mussels (raw) -- 3.5 oz has 25 mg.  Mackerel -- 1 oz has 22 mg.  Trout -- 1 oz has 20  mg.  Pork sausage -- 1 link (1 oz) has 17 mg.  Salmon -- 1 oz has 16 mg.  Tilapia -- 1 oz has 14 mg. Dairy  Soft-serve ice cream --  cup (4 oz) has 103 mg.  Whole-milk yogurt -- 1 cup (8 oz) has 29 mg.  Cheddar cheese -- 1 oz has 28 mg.  American cheese -- 1 oz has 28 mg.  Whole milk -- 1 cup (8 oz) has 23 mg.  2% milk -- 1 cup (8 oz) has 18 mg.  Cream cheese -- 1 tablespoon (Tbsp) has 15 mg.  Cottage cheese --  cup (4 oz) has 14 mg.  Low-fat (1%) milk -- 1 cup (8 oz) has 10 mg.  Sour cream -- 1 Tbsp has 8.5 mg.  Low-fat yogurt -- 1 cup (8 oz) has 8 mg.  Nonfat Greek yogurt -- 1 cup (8 oz) has 7 mg.  Half-and-half cream -- 1 Tbsp has 5 mg. Fats and oils  Cod liver oil -- 1 tablespoon (Tbsp) has 82 mg.  Butter -- 1 Tbsp has 15 mg.  Lard -- 1 Tbsp has 14 mg.  Bacon grease --  1 Tbsp has 14 mg.  Mayonnaise -- 1 Tbsp has 5-10 mg.  Margarine -- 1 Tbsp has 3-10 mg. Exact amounts of cholesterol in these foods may vary depending on specific ingredients and brands. Foods without cholesterol Most plant-based foods do not have cholesterol unless you combine them with a food that has cholesterol. Foods without cholesterol include:  Grains and cereals.  Vegetables.  Fruits.  Vegetable oils, such as olive, canola, and sunflower oil.  Legumes, such as peas, beans, and lentils.  Nuts and seeds.  Egg whites. Summary  The body needs cholesterol in small amounts, but too much cholesterol can cause damage to the arteries and heart.  Most people should eat less than 200 milligrams (mg) of cholesterol a day. This information is not intended to replace advice given to you by your health care provider. Make sure you discuss any questions you have with your health care provider. Document Revised: 10/18/2017 Document Reviewed: 07/02/2017 Elsevier Patient Education  [place].
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
## 2020-09-29 ENCOUNTER — Other Ambulatory Visit: Payer: Self-pay | Admitting: Internal Medicine

## 2020-09-29 ENCOUNTER — Other Ambulatory Visit: Payer: Self-pay

## 2020-09-29 MED ORDER — PRAVASTATIN SODIUM 40 MG PO TABS
40.0000 mg | ORAL_TABLET | Freq: Every day | ORAL | 3 refills | Status: DC
Start: 2020-09-29 — End: 2021-09-25

## 2020-09-30 ENCOUNTER — Telehealth: Payer: Self-pay

## 2020-09-30 ENCOUNTER — Other Ambulatory Visit: Payer: PPO

## 2020-09-30 DIAGNOSIS — Z20822 Contact with and (suspected) exposure to covid-19: Secondary | ICD-10-CM | POA: Diagnosis not present

## 2020-09-30 NOTE — Telephone Encounter (Signed)
Error

## 2020-10-01 LAB — SARS-COV-2, NAA 2 DAY TAT

## 2020-10-01 LAB — NOVEL CORONAVIRUS, NAA: SARS-CoV-2, NAA: NOT DETECTED

## 2020-10-03 ENCOUNTER — Ambulatory Visit (INDEPENDENT_AMBULATORY_CARE_PROVIDER_SITE_OTHER): Payer: PPO | Admitting: Allergy

## 2020-10-03 ENCOUNTER — Encounter: Payer: Self-pay | Admitting: Allergy

## 2020-10-03 ENCOUNTER — Other Ambulatory Visit: Payer: Self-pay

## 2020-10-03 DIAGNOSIS — J4531 Mild persistent asthma with (acute) exacerbation: Secondary | ICD-10-CM | POA: Diagnosis not present

## 2020-10-03 DIAGNOSIS — J3089 Other allergic rhinitis: Secondary | ICD-10-CM | POA: Diagnosis not present

## 2020-10-03 DIAGNOSIS — J019 Acute sinusitis, unspecified: Secondary | ICD-10-CM | POA: Insufficient documentation

## 2020-10-03 DIAGNOSIS — J302 Other seasonal allergic rhinitis: Secondary | ICD-10-CM | POA: Diagnosis not present

## 2020-10-03 MED ORDER — DOXYCYCLINE HYCLATE 100 MG PO CAPS: 100.0000 mg | ORAL_CAPSULE | Freq: Two times a day (BID) | ORAL | 0 refills | Status: DC

## 2020-10-03 MED ORDER — PREDNISONE 10 MG PO TABS: ORAL_TABLET | ORAL | 0 refills | Status: DC

## 2020-10-03 NOTE — Assessment & Plan Note (Addendum)
Past history - 2020 skin testing positive to mold and grass.   Continue Xhance 2 sprays per nostril twice a day.  May use nasal ipratropium one spray per nostril every 8 hours as needed for nasal drainage  Continue Claritin 10mg  daily and may take it twice a day if needed.   Continue environmental control measures.

## 2020-10-03 NOTE — Patient Instructions (Addendum)
Upper respiratory infection.  Take prednisone 40mg  daily x 2 days, 30mg  daily x 2 days, 20mg  daily x 2 days and 10mg  daily x 2 days.  Take Doxycycline 100mg  1 tablet twice a day for 14 days.  See below for upper respiratory infection symptoms management.  Asthma: . Daily controller medication(s): Symbicort 156mcg 2 puffs twice a day with spacer and rinse mouth afterwards. . May use albuterol rescue inhaler 2 puffs every 4 to 6 hours as needed for shortness of breath, chest tightness, coughing, and wheezing. May use albuterol rescue inhaler 2 puffs 5 to 15 minutes prior to strenuous physical activities. Monitor frequency of use.  . Asthma control goals:  o Full participation in all desired activities (may need albuterol before activity) o Albuterol use two times or less a week on average (not counting use with activity) o Cough interfering with sleep two times or less a month o Oral steroids no more than once a year o No hospitalizations  Allergic rhinitis:  Continue Xhance 2 sprays per nostril twice a day.  May use nasal ipratropium one spray per nostril every 8 hours as needed for nasal drainage  Continue Claritin 10mg  daily and may take it twice a day if needed.   Continue environmental control measures.  Follow up with Dr. Nelva Bush in 1 month.    Drink plenty of fluids.  Water, juice, clear broth or warm lemon water are good choices. Avoid caffeine and alcohol, which can dehydrate you.  Eat chicken soup.  Chicken soup and other warm fluids can be soothing and loosen congestion.  Rest.  Adjust your room's temperature and humidity.  Keep your room warm but not overheated. If the air is dry, a cool-mist humidifier or vaporizer can moisten the air and help ease congestion and coughing. Keep the humidifier clean to prevent the growth of bacteria and molds.  Soothe your throat.  Perform a saltwater gargle. Dissolve one-quarter to a half teaspoon of salt in a 4- to 8-ounce  glass of warm water. This can relieve a sore or scratchy throat temporarily.  Use saline nasal drops.  To help relieve nasal congestion, try saline nasal drops. You can buy these drops over the counter, and they can help relieve symptoms ? even in children.  Take over-the-counter cold and cough medications.  For adults and children older than 5, over-the-counter decongestants, antihistamines and pain relievers might offer some symptom relief. However, they won't prevent a cold or shorten its duration.  [name]

## 2020-10-03 NOTE — Telephone Encounter (Signed)
See televisit note from 10/03/20.

## 2020-10-03 NOTE — Assessment & Plan Note (Signed)
Congestion, coughing with discolored phlegm, rhinorrhea and fatigue for the last 1.5 weeks.  Negative Covid testing.  Most likely has acute sinusitis which may be also flaring her asthma.  Take prednisone 40mg  daily x 2 days, 30mg  daily x 2 days, 20mg  daily x 2 days and 10mg  daily x 2 days.  Take Doxycycline 100mg  1 tablet twice a day for 14 days.  See below for upper respiratory infection symptoms management.

## 2020-10-03 NOTE — Progress Notes (Signed)
RE: [name] MRN: [id number] DOB: Feb 15, 1951 Date of Telemedicine Visit: 10/03/2020  Referring provider: Binnie Rail, MD Primary care provider: Binnie Rail, MD  Chief Complaint: Sinus Problem (Stopped up, tired, congestion, coughing, runny nose. Covid Tested but no results yet from drug store. [facility] covid test said she was negative.  Still stuffed up, short of breath)   Telemedicine Follow Up Visit via Telephone: I connected with Kara Ramirez for a follow up on 10/03/20 by telephone and verified that I am speaking with the correct person using two identifiers.   I discussed the limitations, risks, security and privacy concerns of performing an evaluation and management service by telephone and the availability of in person appointments. I also discussed with the patient that there may be a patient responsible charge related to this service. The patient expressed understanding and agreed to proceed.  Patient is at home/work. Provider is at the office.  Visit start time: 10:45AM Visit end time: 11:15AM Insurance consent/check in by: front desk Medical consent and medical assistant/nurse: Lucretia K.  History of Present Illness: She is a 69 y.o. female, who is being followed for asthma, allergic rhinitis, rash. Her previous allergy office visit was on 05/12/2020 with Dr. Nelva Bush. Today is a new complaint of not feeling well.  She noticed some issues with nasal congestion, coughing, rhinorrhea, fatigued which has been worsening the past 1.5 weeks.  Denies any fevers or chills. Patient had COVID-19 testing done on 09/30/2020 which was negative.   Patient is having clear rhinorrhea and coughing with greenish/yellow phlegm.  Currently taking Claritin daily and has not taken it BID. Taking Xhance 2 sprays at night, and ipratropium once a day as needed with some benefit.   Mild persistent asthma Currently on Symbicort 117mcg 2 puffs BID and using albuterol 2-3 times per day for  about 1 week with good benefit. Now having coughing, shortness of breath, chest tightness.  No recent prednisone use.   Assessment and Plan: Kara Ramirez is a 69 y.o. female with: Acute sinusitis Congestion, coughing with discolored phlegm, rhinorrhea and fatigue for the last 1.5 weeks.  Negative Covid testing.  Most likely has acute sinusitis which may be also flaring her asthma.  Take prednisone 40mg  daily x 2 days, 30mg  daily x 2 days, 20mg  daily x 2 days and 10mg  daily x 2 days.  Take Doxycycline 100mg  1 tablet twice a day for 14 days.  See below for upper respiratory infection symptoms management.  Mild persistent asthma with acute exacerbation Flare with current URI.  Start prednisone taper as above.  Daily controller medication(s): Symbicort 117mcg 2 puffs twice a day with spacer and rinse mouth afterwards.  May use albuterol rescue inhaler 2 puffs every 4 to 6 hours as needed for shortness of breath, chest tightness, coughing, and wheezing. May use albuterol rescue inhaler 2 puffs 5 to 15 minutes prior to strenuous physical activities. Monitor frequency of use.   Seasonal and perennial allergic rhinitis Past history - 2020 skin testing positive to mold and grass.   Continue Xhance 2 sprays per nostril twice a day.  May use nasal ipratropium one spray per nostril every 8 hours as needed for nasal drainage  Continue Claritin 10mg  daily and may take it twice a day if needed.   Continue environmental control measures.  Return in about 4 weeks (around 10/31/2020).  Meds ordered this encounter  Medications   predniSONE (DELTASONE) 10 MG tablet    Sig: Take prednisone 40mg  daily x  2 days, 30mg  daily x 2 days, 20mg  daily x 2 days and 10mg  daily x 2 days.    Dispense:  20 tablet    Refill:  0   doxycycline (VIBRAMYCIN) 100 MG capsule    Sig: Take 1 capsule (100 mg total) by mouth 2 (two) times daily.    Dispense:  28 capsule    Refill:  0    Diagnostics: None.  Medication List:  Current Outpatient Medications  Medication Sig Dispense Refill   acetaminophen (TYLENOL) 325 MG tablet Take 1-2 tablets (325-650 mg total) by mouth every 4 (four) hours as needed for mild pain. (Patient taking differently: Take 325 mg by mouth every 4 (four) hours as needed for mild pain or headache. )     albuterol (PROAIR HFA) 108 (90 Base) MCG/ACT inhaler INHALE 2 PUFFS INTO THE LUNGS EVERY 4 HOURS AS NEEDED FOR COUGH OR WHEEZE. MAY USE 2 PUFFS 10 TO 20 MINUTES PRIOR TO EXERCISE 8.5 each 0   aspirin-acetaminophen-caffeine (EXCEDRIN MIGRAINE) 250-250-65 MG tablet Take 1 tablet by mouth every 6 (six) hours as needed for headache.     benzonatate (TESSALON PERLES) 100 MG capsule Take 1 capsule (100 mg total) by mouth 3 (three) times daily as needed for cough. 30 capsule 0   budesonide-formoterol (SYMBICORT) 160-4.5 MCG/ACT inhaler Inhale 2 puffs into the lungs 2 (two) times daily. Use with spacer 1 Inhaler 5   Calcium Carbonate-Vitamin D (CALCIUM 600+D) 600-400 MG-UNIT per tablet Take 1 tablet by mouth daily.      Carboxymethylcellul-Glycerin (REFRESH OPTIVE) 1-0.9 % GEL Place 1 application into both eyes at bedtime.      cholecalciferol (VITAMIN D) 1000 UNITS tablet Take 1,000 Units by mouth daily.       Co-Enzyme Q-10 100 MG CAPS Take 100 mg by mouth daily.      ezetimibe (ZETIA) 10 MG tablet Take 1 tablet (10 mg total) by mouth daily. 90 tablet 3   famotidine (PEPCID) 20 MG tablet Take 1 tablet (20 mg total) by mouth daily. 90 tablet 3   fluocinonide ointment (LIDEX) 8.50 % Apply 1 application topically See admin instructions. Apply topically twice daily for 5 days alternating with Tacrolimus ointment     fluticasone (FLONASE) 50 MCG/ACT nasal spray USE 1 SPRAY IN EACH NOSTRIL MID-DAY FOR CONGESTION (Patient taking differently: Place 1 spray into both nostrils daily. ) 16 g 4   fluticasone (FLOVENT HFA) 110 MCG/ACT inhaler Inhale 2 puffs  into the lungs 2 (two) times daily. 1 Inhaler 1   ipratropium (ATROVENT) 0.03 % nasal spray Place 2 sprays into both nostrils every 4 (four) hours as needed for rhinitis. 30 mL 5   loratadine (CLARITIN) 10 MG tablet Take 10 mg by mouth daily.     magnesium oxide (MAG-OX) 400 MG tablet Take 400 mg by mouth at bedtime.     MALIC ACID PO Take 1 capsule by mouth at bedtime.     metoprolol tartrate (LOPRESSOR) 25 MG tablet TAKE 1/2 TABLET BY MOUTH TWICE A DAY 90 tablet 3   mirabegron ER (MYRBETRIQ) 50 MG TB24 tablet Take 50 mg by mouth daily as needed.      naproxen sodium (ALEVE) 220 MG tablet Take 220 mg by mouth daily as needed.      Olopatadine HCl 0.2 % SOLN Apply to eye 2 (two) times daily as needed.     Omega-3 Fatty Acids (FISH OIL) 1000 MG CAPS Take 1,000 mg by mouth at bedtime.  Phenylephrine-Witch Hazel (HEMORRHOIDAL COOLING) 0.25-50 % GEL Apply topically.     polyethylene glycol (MIRALAX / GLYCOLAX) 17 g packet Take 17 g by mouth daily.     pravastatin (PRAVACHOL) 40 MG tablet Take 1 tablet (40 mg total) by mouth daily. 90 tablet 3   senna (SENOKOT) 8.6 MG tablet Take 1 tablet by mouth daily.      sodium chloride (OCEAN) 0.65 % SOLN nasal spray Place 1 spray into both nostrils as needed for congestion. (Patient taking differently: Place 1 spray into both nostrils 4 (four) times daily as needed for congestion. )  0   tacrolimus (PROTOPIC) 0.1 % ointment Apply 1 application topically 2 (two) times daily as needed (PRN skin issues). (Patient taking differently: Apply 1 application topically See admin instructions. Apply topically twice daily for 5 days alternating with Fluocinonide ointment) 100 g 0   Thiamine HCl (VITAMIN B-1) 100 MG tablet Take 100 mg by mouth daily.       triamcinolone ointment (KENALOG) 0.5 % Apply 1 application topically 2 (two) times daily. For rash on left lower leg 30 g 0   AMBULATORY NON FORMULARY MEDICATION Medication Name: Nitroglycerine  ointment 0.125 % with 5% Lidocaine Apply a pea sized amount internally three times daily. Dispense 30 GM zero refill (Patient not taking: Reported on 10/03/2020) 30 g 1   doxycycline (VIBRAMYCIN) 100 MG capsule Take 1 capsule (100 mg total) by mouth 2 (two) times daily. 28 capsule 0   ibandronate (BONIVA) 150 MG tablet Take 150 mg by mouth every 30 (thirty) days. Take in the morning with a full glass of water, on an empty stomach, and do not take anything else by mouth or lie down for the next 30 min. (Patient not taking: Reported on 10/03/2020)     levETIRAcetam (KEPPRA) 250 MG tablet Take 1 tablet (250 mg total) by mouth 2 (two) times daily. (Patient not taking: Reported on 09/01/2020) 60 tablet 5   methylcellulose (CITRUCEL) oral powder Take 1 packet by mouth daily. (Patient not taking: Reported on 09/01/2020)     predniSONE (DELTASONE) 10 MG tablet Take prednisone 40mg  daily x 2 days, 30mg  daily x 2 days, 20mg  daily x 2 days and 10mg  daily x 2 days. 20 tablet 0   No current facility-administered medications for this visit.   Allergies: Allergies  Allergen Reactions   Mold Extract [Trichophyton Mentagrophyte] Shortness Of Breath and Rash   Penicillins Shortness Of Breath, Rash and Other (See Comments)    Eyes puffy Has taken low dose pcn and no rx REACTION: rash, SOB Has patient had a PCN reaction causing immediate rash, facial/tongue/throat swelling, SOB or lightheadedness with hypotension: yes Has patient had a PCN reaction causing severe rash involving mucus membranes or skin necrosis: unk Has patient had a PCN reaction that required hospitalization: no Has patient had a PCN reaction occurring within the last 10 years: unk If all of the above answers are "NO", then may proceed with Cephalospor   Morphine Other (See Comments)    REACTION: tachycardia and anxiety   Peanut Oil Nausea And Vomiting    Peanut butter   Protonix [Pantoprazole Sodium] Nausea And Vomiting    Atorvastatin     MYAGLIAS   Citrus Other (See Comments)    Unknown   Peanut-Containing Drug Products Other (See Comments)    Unknown   Rosuvastatin     MYALGIAS   Tramadol Other (See Comments)    Makes crazy;confused   Valium [Diazepam] Other (See Comments)  Confusion per family   Cetirizine Rash and Other (See Comments)    Around face   Codeine Other (See Comments)    REACTION: dizzy and "groggy in my head"   Eggs Or Egg-Derived Products Nausea And Vomiting   Latex Itching   Pentazocine Lactate Palpitations   I reviewed her past medical history, social history, family history, and environmental history and no significant changes have been reported from her previous visit.  Review of Systems  Constitutional: Positive for fatigue. Negative for appetite change, chills, fever and unexpected weight change.  HENT: Positive for congestion and rhinorrhea.   Eyes: Negative for itching.  Respiratory: Positive for cough, chest tightness and shortness of breath. Negative for wheezing.   Gastrointestinal: Negative for abdominal pain.  Skin: Negative for rash.  Allergic/Immunologic: Positive for environmental allergies.  Neurological: Positive for headaches.   Objective: Physical Exam Vitals and nursing note reviewed.  Constitutional:      Appearance: Normal appearance. She is well-developed.  HENT:     Head: Normocephalic and atraumatic.     Right Ear: External ear normal.     Left Ear: External ear normal.     Nose: Nose normal.     Mouth/Throat:     Mouth: Mucous membranes are moist.     Pharynx: Oropharynx is clear.  Eyes:     Conjunctiva/sclera: Conjunctivae normal.  Cardiovascular:     Rate and Rhythm: Normal rate and regular rhythm.     Heart sounds: Normal heart sounds. No murmur heard.   Pulmonary:     Effort: Pulmonary effort is normal.     Breath sounds: Normal breath sounds. No wheezing, rhonchi or rales.  Musculoskeletal:     Cervical back: Neck  supple.  Skin:    General: Skin is warm.     Findings: No rash.  Neurological:     Mental Status: She is alert and oriented to person, place, and time.  Psychiatric:        Behavior: Behavior normal.    Not obtained as encounter was done via telephone.   Previous notes and tests were reviewed.  I discussed the assessment and treatment plan with the patient. The patient was provided an opportunity to ask questions and all were answered. The patient agreed with the plan and demonstrated an understanding of the instructions. After visit summary/patient instructions available via mychart.   The patient was advised to call back or seek an in-person evaluation if the symptoms worsen or if the condition fails to improve as anticipated.  I provided 30 minutes of non-face-to-face time during this encounter.  It was my pleasure to participate in [facility] care today. Please feel free to contact me with any questions or concerns.   Sincerely,  Rexene Alberts, DO Allergy & Immunology  [facility] office: [place] office: [phone]

## 2020-10-03 NOTE — Assessment & Plan Note (Signed)
Flare with current URI.  Start prednisone taper as above. . Daily controller medication(s): Symbicort 158mcg 2 puffs twice a day with spacer and rinse mouth afterwards. . May use albuterol rescue inhaler 2 puffs every 4 to 6 hours as needed for shortness of breath, chest tightness, coughing, and wheezing. May use albuterol rescue inhaler 2 puffs 5 to 15 minutes prior to strenuous physical activities. Monitor frequency of use.

## 2020-10-05 ENCOUNTER — Ambulatory Visit: Payer: PPO | Admitting: Pharmacist

## 2020-10-05 ENCOUNTER — Other Ambulatory Visit: Payer: Self-pay

## 2020-10-05 DIAGNOSIS — I480 Paroxysmal atrial fibrillation: Secondary | ICD-10-CM

## 2020-10-05 DIAGNOSIS — J4531 Mild persistent asthma with (acute) exacerbation: Secondary | ICD-10-CM

## 2020-10-05 DIAGNOSIS — E782 Mixed hyperlipidemia: Secondary | ICD-10-CM

## 2020-10-05 NOTE — Patient Instructions (Signed)
Visit Information  Phone number for Pharmacist: [phone]  Goals Addressed            This [place]   On track    [place] (see longitudinal plan of care for additional care plan information)  Current Barriers:   Chronic Disease Management support, education, and care coordination needs related to Hyperlipidemia, Atrial Fibrillation, and Asthma   Atrial fibrillation Pulse Readings from Last 3 Encounters:  08/18/20 89  06/28/20 81  06/13/20 75   BP Readings from Last 3 Encounters:  08/18/20 110/70  06/28/20 120/74  06/13/20 108/74   Pharmacist Clinical Goal(s): o Over the next 60 days, patient will work with PharmD and providers to maintain normal sinus rhythm   Current regimen:  o Metoprolol tartrate 25 mg - 1/2 tab BID  Interventions: o Discussed benefits of checking BP and HR at home to monitor for low blood pressure and heart rate  Patient self care activities - Over the next 60 days, patient will: o Check BP weekly, document, and provide at future appointments o Ensure daily salt intake < 2300 mg/day  Hyperlipidemia Lab Results  Component Value Date/Time   LDLCALC 119 (H) 08/31/2020 10:13 AM   Pharmacist Clinical Goal(s): o Over the next 60 days, patient will work with PharmD and providers to achieve LDL goal < 70  Current regimen:  o Pravastatin 40 mg daily o Ezetimibe 10 mg daily  Interventions: o Discussed cholesterol goals and benefits of medications for prevention of heart attack / stroke  Patient self care activities - Over the next 60 days, patient will: o Follow up lipid panel in January 2022  Asthma  Pharmacist Clinical Goal(s) o Over the next 60 days, patient will work with PharmD and providers to optimize therapy  Current regimen:  o Albuterol HFA prn o Symbicort 160-4.5 mcg/act 2 puffs twice a day using spacer  Interventions: o Discussed role of Symbicort in asthma and goal to reduce need  for rescue inhaler o Advised to follow up with allergist  Patient self care activities - Over the next 60 days, patient will: o Follow up with allergist regarding inhaler therapy  Myoclonus  Pharmacist Clinical Goal(s) o Over the next 60 days, patient will work with PharmD and providers to optimize therapy  Current regimen:  o No medications  Interventions: o Discussed other options for myoclonus including clonazepam; advised to follow up with neurologist regarding next steps  Patient self care activities - Over the next 60 days, patient will: o Follow up with neurologist to discuss next treatment  Medication management  Pharmacist Clinical Goal(s): o Over the next 60 days, patient will work with PharmD and providers to maintain optimal medication adherence  [facility]  Interventions o Comprehensive medication review performed. o Continue current medication management strategy  Patient self care activities - Over the next 60 days, patient will: o Focus on medication adherence by fill date o Take medications as prescribed o Report any questions or concerns to PharmD and/or provider(s)  Please see past updates related to this goal by clicking on the "Past Updates" button in the selected goal       The patient verbalized understanding of instructions, educational materials, and care plan provided today and declined offer to receive copy of patient instructions, educational materials, and care plan.  Telephone follow up appointment with pharmacy team member scheduled for: 2 months  Charlene Brooke, PharmD, [facility] Clinical Pharmacist [facility] [place]
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
## 2020-10-18 ENCOUNTER — Telehealth: Payer: Self-pay | Admitting: Pharmacist

## 2020-10-18 NOTE — Progress Notes (Signed)
Chronic Care Management Pharmacy Assistant   Name: Kara Ramirez  MRN: [id number] DOB: November 11, 1951  Reason for Encounter: Chart Review   PCP : Binnie Rail, MD  Allergies:   Allergies  Allergen Reactions  . Mold Extract [Trichophyton Mentagrophyte] Shortness Of Breath and Rash  . Penicillins Shortness Of Breath, Rash and Other (See Comments)    Eyes puffy Has taken low dose pcn and no rx REACTION: rash, SOB Has patient had a PCN reaction causing immediate rash, facial/tongue/throat swelling, SOB or lightheadedness with hypotension: yes Has patient had a PCN reaction causing severe rash involving mucus membranes or skin necrosis: unk Has patient had a PCN reaction that required hospitalization: no Has patient had a PCN reaction occurring within the last 10 years: unk If all of the above answers are "NO", then may proceed with Cephalospor  . Morphine Other (See Comments)    REACTION: tachycardia and anxiety  . Peanut Oil Nausea And Vomiting    Peanut butter  . Protonix [Pantoprazole Sodium] Nausea And Vomiting  . Atorvastatin     MYAGLIAS  . Citrus Other (See Comments)    Unknown  . Peanut-Containing Drug Products Other (See Comments)    Unknown  . Rosuvastatin     MYALGIAS  . Tramadol Other (See Comments)    Makes crazy;confused  . Valium [Diazepam] Other (See Comments)    Confusion per family  . Cetirizine Rash and Other (See Comments)    Around face  . Codeine Other (See Comments)    REACTION: dizzy and "groggy in my head"  . Eggs Or Egg-Derived Products Nausea And Vomiting  . Latex Itching  . Pentazocine Lactate Palpitations    Medications: Outpatient Encounter Medications as of 10/18/2020  Medication Sig Note  . acetaminophen (TYLENOL) 325 MG tablet Take 1-2 tablets (325-650 mg total) by mouth every 4 (four) hours as needed for mild pain. (Patient taking differently: Take 325 mg by mouth every 4 (four) hours as needed for mild pain or headache. )   .  albuterol (PROAIR HFA) 108 (90 Base) MCG/ACT inhaler INHALE 2 PUFFS INTO THE LUNGS EVERY 4 HOURS AS NEEDED FOR COUGH OR WHEEZE. MAY USE 2 PUFFS 10 TO 20 MINUTES PRIOR TO EXERCISE   . AMBULATORY NON FORMULARY MEDICATION Medication Name: Nitroglycerine ointment 0.125 % with 5% Lidocaine Apply a pea sized amount internally three times daily. Dispense 30 GM zero refill   . aspirin-acetaminophen-caffeine (EXCEDRIN MIGRAINE) 250-250-65 MG tablet Take 1 tablet by mouth every 6 (six) hours as needed for headache.   . benzonatate (TESSALON PERLES) 100 MG capsule Take 1 capsule (100 mg total) by mouth 3 (three) times daily as needed for cough.   . budesonide-formoterol (SYMBICORT) 160-4.5 MCG/ACT inhaler Inhale 2 puffs into the lungs 2 (two) times daily. Use with spacer   . Calcium Carbonate-Vitamin D (CALCIUM 600+D) 600-400 MG-UNIT per tablet Take 1 tablet by mouth daily.    . Carboxymethylcellul-Glycerin (REFRESH OPTIVE) 1-0.9 % GEL Place 1 application into both eyes at bedtime.    . cholecalciferol (VITAMIN D) 1000 UNITS tablet Take 1,000 Units by mouth daily.     [name] Co-Enzyme Q-10 100 MG CAPS Take 100 mg by mouth daily.    [name] doxycycline (VIBRAMYCIN) 100 MG capsule Take 1 capsule (100 mg total) by mouth 2 (two) times daily.   [name] ezetimibe (ZETIA) 10 MG tablet Take 1 tablet (10 mg total) by mouth daily.   . famotidine (PEPCID) 20 MG tablet Take 1 tablet (  20 mg total) by mouth daily.   . fluocinonide ointment (LIDEX) 3.08 % Apply 1 application topically See admin instructions. Apply topically twice daily for 5 days alternating with Tacrolimus ointment   . fluticasone (FLONASE) 50 MCG/ACT nasal spray USE 1 SPRAY IN EACH NOSTRIL MID-DAY FOR CONGESTION (Patient taking differently: Place 1 spray into both nostrils daily. )   . fluticasone (FLOVENT HFA) 110 MCG/ACT inhaler Inhale 2 puffs into the lungs 2 (two) times daily. 08/11/2019: Only using in the morning  . ibandronate (BONIVA) 150 MG tablet Take 150 mg by  mouth every 30 (thirty) days. Take in the morning with a full glass of water, on an empty stomach, and do not take anything else by mouth or lie down for the next 30 min.    [name] ipratropium (ATROVENT) 0.03 % nasal spray Place 2 sprays into both nostrils every 4 (four) hours as needed for rhinitis.   [name] levETIRAcetam (KEPPRA) 250 MG tablet Take 1 tablet (250 mg total) by mouth 2 (two) times daily.   [name] loratadine (CLARITIN) 10 MG tablet Take 10 mg by mouth daily.   . magnesium oxide (MAG-OX) 400 MG tablet Take 400 mg by mouth at bedtime.   [name] MALIC ACID PO Take 1 capsule by mouth at bedtime.   . methylcellulose (CITRUCEL) oral powder Take 1 packet by mouth daily.   . metoprolol tartrate (LOPRESSOR) 25 MG tablet TAKE 1/2 TABLET BY MOUTH TWICE A DAY   . mirabegron ER (MYRBETRIQ) 50 MG TB24 tablet Take 50 mg by mouth daily as needed.    . naproxen sodium (ALEVE) 220 MG tablet Take 220 mg by mouth daily as needed.    . Olopatadine HCl 0.2 % SOLN Apply to eye 2 (two) times daily as needed.   . Omega-3 Fatty Acids (FISH OIL) 1000 MG CAPS Take 1,000 mg by mouth at bedtime.    [name] Phenylephrine-Witch Hazel (HEMORRHOIDAL COOLING) 0.25-50 % GEL Apply topically.   . polyethylene glycol (MIRALAX / GLYCOLAX) 17 g packet Take 17 g by mouth daily.   . pravastatin (PRAVACHOL) 40 MG tablet Take 1 tablet (40 mg total) by mouth daily.   . predniSONE (DELTASONE) 10 MG tablet Take prednisone 40mg  daily x 2 days, 30mg  daily x 2 days, 20mg  daily x 2 days and 10mg  daily x 2 days.   [name] senna (SENOKOT) 8.6 MG tablet Take 1 tablet by mouth daily.    . sodium chloride (OCEAN) 0.65 % SOLN nasal spray Place 1 spray into both nostrils as needed for congestion. (Patient taking differently: Place 1 spray into both nostrils 4 (four) times daily as needed for congestion. )   . tacrolimus (PROTOPIC) 0.1 % ointment Apply 1 application topically 2 (two) times daily as needed (PRN skin issues). (Patient taking differently: Apply 1 application  topically See admin instructions. Apply topically twice daily for 5 days alternating with Fluocinonide ointment)   . Thiamine HCl (VITAMIN B-1) 100 MG tablet Take 100 mg by mouth daily.     [name] triamcinolone ointment (KENALOG) 0.5 % Apply 1 application topically 2 (two) times daily. For rash on left lower leg    No facility-administered encounter medications on file as of 10/18/2020.    Current Diagnosis: Patient Active Problem List   Diagnosis Date Noted  . Acute sinusitis 10/03/2020  . Mild persistent asthma with acute exacerbation 10/03/2020  . Seasonal and perennial allergic rhinitis 10/03/2020  . Nummular eczema 06/13/2020  . Dizziness 04/26/2020  . Headache 04/26/2020  .  Fatigue 04/26/2020  . Vaginal prolapse 12/17/2018  . Jerking 09/22/2018  . Closed head injury 09/11/2018  . Mild intermittent asthma without complication 22/57/5051  . Multinodular thyroid, follow up US in 05/2019 06/10/2018  . Fibromyalgia 05/26/2018  . 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 to 5 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 ([place]) 03/25/2018  . Coccygeal pain 03/25/2018  . Difficulty with speech 03/24/2018  . Poor balance 03/24/2018  . Hip pain 03/24/2018  . SAH (subarachnoid hemorrhage) ([place]) 03/06/2018  . Chest tightness 02/04/2018  . Left lower lobe pulmonary nodule 12/10/2017  . Paroxysmal atrial fibrillation ([place]) 10/30/2017  . Chronic sinusitis 03/14/2017  . Severe scoliosis 12/11/2016  . Prediabetes 06/06/2016  . Cough 06/06/2016  . Allergic rhinitis 02/08/2016  . Osteoporosis 12/05/2015  . Hyperlipidemia 05/27/2015  . Constipation 08/23/2011    Goals Addressed   None     Follow-Up:  Pharmacist Review     Wendy Poet, [place]

## 2020-10-27 DIAGNOSIS — Z6825 Body mass index (BMI) 25.0-25.9, adult: Secondary | ICD-10-CM | POA: Diagnosis not present

## 2020-10-27 DIAGNOSIS — S069X0A Unspecified intracranial injury without loss of consciousness, initial encounter: Secondary | ICD-10-CM | POA: Diagnosis not present

## 2020-10-27 DIAGNOSIS — M4120 Other idiopathic scoliosis, site unspecified: Secondary | ICD-10-CM | POA: Diagnosis not present

## 2020-11-09 DIAGNOSIS — Z1231 Encounter for screening mammogram for malignant neoplasm of breast: Secondary | ICD-10-CM | POA: Diagnosis not present

## 2020-11-09 DIAGNOSIS — F419 Anxiety disorder, unspecified: Secondary | ICD-10-CM | POA: Diagnosis not present

## 2020-11-09 DIAGNOSIS — Z6827 Body mass index (BMI) 27.0-27.9, adult: Secondary | ICD-10-CM | POA: Diagnosis not present

## 2020-11-09 DIAGNOSIS — N958 Other specified menopausal and perimenopausal disorders: Secondary | ICD-10-CM | POA: Diagnosis not present

## 2020-11-09 DIAGNOSIS — Z01419 Encounter for gynecological examination (general) (routine) without abnormal findings: Secondary | ICD-10-CM | POA: Diagnosis not present

## 2020-11-17 ENCOUNTER — Ambulatory Visit: Payer: PPO | Admitting: Allergy

## 2020-11-17 ENCOUNTER — Encounter: Payer: Self-pay | Admitting: Allergy

## 2020-11-17 ENCOUNTER — Other Ambulatory Visit: Payer: Self-pay

## 2020-11-17 VITALS — BP 100/76 | HR 72 | Temp 97.9°F | Resp 14 | Ht 65.0 in | Wt 164.0 lb

## 2020-11-17 DIAGNOSIS — J3089 Other allergic rhinitis: Secondary | ICD-10-CM | POA: Diagnosis not present

## 2020-11-17 DIAGNOSIS — J329 Chronic sinusitis, unspecified: Secondary | ICD-10-CM | POA: Diagnosis not present

## 2020-11-17 DIAGNOSIS — J310 Chronic rhinitis: Secondary | ICD-10-CM

## 2020-11-17 DIAGNOSIS — J302 Other seasonal allergic rhinitis: Secondary | ICD-10-CM

## 2020-11-17 DIAGNOSIS — J4530 Mild persistent asthma, uncomplicated: Secondary | ICD-10-CM | POA: Diagnosis not present

## 2020-11-17 NOTE — Progress Notes (Signed)
Assessment/Plan:   1.  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  Pt had same experience with Keppra.  -restart VPA but 1/2 the dose.  VPA 125 mg bid.   2.  Gait ataxia  -use walker at all times.  She and I discussed this previously as well.    -recommended repeating PT.  -will do DaT scan given tremor  -if neg will do SCA evaluation given diplopia (has prisms in the glasses) to make sure nothing else going on  3.  F/u depending on results of above but within next 6 month.   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  My previous records as well as any outside records available were reviewed prior to todays visit.  We started her on Keppra last visit.  Reports today that she is not taking the medication.  States that she didn't like the way it made her feel - c/o irritability.   She isn't getting the shoulder movements as much but the L hand seems to shake, especially with cooking.     PREVIOUS MEDICATIONS: VPA (helped myoclonus but pt thought caused mood issues); keppra (also with mood issues)  CURRENT MEDICATIONS:  Outpatient Encounter Medications as of 11/22/2020  Medication Sig  . acetaminophen (TYLENOL) 325 MG tablet Take 1-2 tablets (325-650 mg total) by mouth every 4 (four) hours as needed for mild pain. (Patient taking differently: Take 325 mg by mouth every 4 (four) hours as needed for mild pain or headache.)  . albuterol (PROAIR HFA) 108 (90 Base) MCG/ACT inhaler INHALE 2 PUFFS INTO THE LUNGS EVERY 4 HOURS AS NEEDED FOR COUGH OR WHEEZE. MAY USE 2 PUFFS 10 TO 20 MINUTES PRIOR TO EXERCISE  . ALPRAZolam (XANAX) 0.25 MG tablet Take 0.25 mg by mouth 2 (two) times daily as needed.  . AMBULATORY NON FORMULARY MEDICATION Medication Name:  Nitroglycerine ointment 0.125 % with 5% Lidocaine Apply a pea sized amount internally three times daily. Dispense 30 GM zero refill  . aspirin-acetaminophen-caffeine (EXCEDRIN MIGRAINE) 250-250-65 MG tablet Take 1 tablet by mouth every 6 (six) hours as needed for headache.  . budesonide-formoterol (SYMBICORT) 160-4.5 MCG/ACT inhaler Inhale 2 puffs into the lungs 2 (two) times daily. Use with spacer  . Calcium Carbonate-Vitamin D 600-400 MG-UNIT tablet Take 1 tablet by mouth daily.  . Carboxymethylcellul-Glycerin 1-0.9 % GEL Place 1 application into both eyes at bedtime.  . cholecalciferol (VITAMIN D) 1000 UNITS tablet Take 1,000 Units by mouth daily.  [name] Co-Enzyme Q-10 100 MG CAPS Take 100 mg by mouth daily.   [name] ezetimibe (ZETIA) 10 MG tablet Take 1 tablet (10 mg total) by mouth daily.  . famotidine (PEPCID) 20 MG tablet Take 1 tablet (20 mg total) by mouth daily.  . fluocinonide ointment (LIDEX) 0.05 % Apply 1 application topically See admin instructions. Apply topically twice daily for 5 days alternating with Tacrolimus ointment  . fluticasone (FLONASE) 50 MCG/ACT nasal spray USE 1 SPRAY IN EACH NOSTRIL MID-DAY FOR CONGESTION (Patient taking differently: Place 1 spray into both nostrils daily.)  . fluticasone (FLOVENT HFA) 110 MCG/ACT inhaler Inhale 2 puffs into the lungs 2 (two) times daily.  [name] ibandronate (BONIVA) 150 MG tablet Take 150 mg by mouth every 30 (thirty) days. Take in the morning  with a full glass of water, on an empty stomach, and do not take anything else by mouth or lie down for the next 30 min.   [name] ipratropium (ATROVENT) 0.03 % nasal spray Place 2 sprays into both nostrils every 4 (four) hours as needed for rhinitis.  [name] loratadine (CLARITIN) 10 MG tablet Take 10 mg by mouth daily.  . magnesium oxide (MAG-OX) 400 MG tablet Take 400 mg by mouth at bedtime.  [name] MALIC ACID PO Take 1 capsule by mouth at bedtime.  . methylcellulose (CITRUCEL) oral powder Take 1 packet by mouth daily.   . metoprolol tartrate (LOPRESSOR) 25 MG tablet TAKE 1/2 TABLET BY MOUTH TWICE A DAY  . mirabegron ER (MYRBETRIQ) 50 MG TB24 tablet Take 50 mg by mouth daily as needed.   . naproxen sodium (ALEVE) 220 MG tablet Take 220 mg by mouth daily as needed.   . Olopatadine HCl 0.2 % SOLN Apply to eye 2 (two) times daily as needed.  . Omega-3 Fatty Acids (FISH OIL) 1000 MG CAPS Take 1,000 mg by mouth at bedtime.   [name] Phenylephrine-Witch Hazel (HEMORRHOIDAL COOLING) 0.25-50 % GEL Apply topically.  . polyethylene glycol (MIRALAX / GLYCOLAX) 17 g packet Take 17 g by mouth daily.  . pravastatin (PRAVACHOL) 40 MG tablet Take 1 tablet (40 mg total) by mouth daily.  [name] senna (SENOKOT) 8.6 MG tablet Take 1 tablet by mouth daily.   . sodium chloride (OCEAN) 0.65 % SOLN nasal spray Place 1 spray into both nostrils as needed for congestion. (Patient taking differently: Place 1 spray into both nostrils 4 (four) times daily as needed for congestion.)  . tacrolimus (PROTOPIC) 0.1 % ointment Apply 1 application topically 2 (two) times daily as needed (PRN skin issues). (Patient taking differently: Apply 1 application topically See admin instructions. Apply topically twice daily for 5 days alternating with Fluocinonide ointment)  . Thiamine HCl (VITAMIN B-1) 100 MG tablet Take 100 mg by mouth daily.  [name] triamcinolone ointment (KENALOG) 0.5 % Apply 1 application topically 2 (two) times daily. For rash on left lower leg  . benzonatate (TESSALON PERLES) 100 MG capsule Take 1 capsule (100 mg total) by mouth 3 (three) times daily as needed for cough. (Patient not taking: No sig reported)  . doxycycline (VIBRAMYCIN) 100 MG capsule Take 1 capsule (100 mg total) by mouth 2 (two) times daily. (Patient not taking: Reported on 11/22/2020)  . levETIRAcetam (KEPPRA) 250 MG tablet Take 1 tablet (250 mg total) by mouth 2 (two) times daily. (Patient not taking: Reported on 11/22/2020)  . predniSONE (DELTASONE) 10 MG tablet Take prednisone 40mg   daily x 2 days, 30mg  daily x 2 days, 20mg  daily x 2 days and 10mg  daily x 2 days. (Patient not taking: Reported on 11/22/2020)   No facility-administered encounter medications on file as of 11/22/2020.     Objective:   PHYSICAL EXAMINATION:    VITALS:   Vitals:   11/22/20 1126  BP: 122/78  Pulse: 80  SpO2: 96%  Weight: 166 lb (75.3 kg)  Height: 5\' 5"  (1.651 m)    GEN:  The patient appears stated age and is in NAD. HEENT:  Normocephalic, atraumatic.  The mucous membranes are moist. The superficial temporal arteries are without ropiness or tenderness. CV:  RRR Lungs:  CTAB Neck/HEME:  There are no carotid bruits bilaterally.  Neurological examination:  Orientation: The patient is alert and oriented x3. Cranial nerves: There is good facial symmetry.The speech is fluent and clear. Soft palate  rises symmetrically and there is no tongue deviation. Hearing is intact to conversational tone. Sensation: Sensation is intact to light touch throughout Motor: Strength is at least antigravity x4.  Movement examination: Tone: There is normal tone in the UE/LE Abnormal movements:  Irregular tremulous motion in the L hand with activation.  No rest tremor Coordination:  There is slow with RAMs on the L but no real decremation.  She talks with her hands but then is very slow when doing RAMs Gait and Station: The patient is slow and tenuous with ambulation.  She is wide based.  Ambulates with cane.    Total time spent on today's visit was 30 minutes, including both face-to-face time and nonface-to-face time.  Time included that spent on review of records (prior notes available to me/labs/imaging if pertinent), discussing treatment and goals, answering patient's questions and coordinating care.  Cc:  Binnie Rail, MD

## 2020-11-17 NOTE — Progress Notes (Signed)
Follow-up Note  RE: Kara Ramirez MRN: [id number] DOB: [birth year] Date of Office Visit: 11/17/2020   History of present illness: Kara Ramirez is a 69 y.o. female presenting today for follow-up of asthma and allergic rhinitis.  She was last seen in the office a month ago by Dr. Maudie Mercury for upper respiratory infection.  At that time she was advised to do doxycycline course as well as a prednisone course which she completed. She states currently she is having a lot of throat clearing all day.  She is coughing a lot and states can cough up productive mucus and sometimes a little hard balls of mucus.  She has been using albuterol about 1-2 times a day due to the symptoms.  She is doing the Symbicort 2 puffs twice a day as well.  She otherwise denies any further antibiotic or steroid needs since her last visit. She states she is using the ipratropium nasal spray in the morning and may be another time in the day.  She also states she is using either the Flonase or the [place] she has both at home.  She is doing that twice a day as well.  She is also using nasal saline spray.  She is taking Claritin daily as well.  States after she eats she has been she really notices more nasal congestion and drainage especially if she eats something cold.  Review of systems: Review of Systems  Constitutional: Negative.   HENT:       See HPI  Eyes: Negative.   Respiratory:       See HPI  Cardiovascular: Negative.   Gastrointestinal: Negative.   Musculoskeletal: Negative.   Skin: Negative.     All other systems negative unless noted above in HPI  Past medical/social/surgical/family history have been reviewed and are unchanged unless specifically indicated below.  No changes  Medication List: Current Outpatient Medications  Medication Sig Dispense Refill  . acetaminophen (TYLENOL) 325 MG tablet Take 1-2 tablets (325-650 mg total) by mouth every 4 (four) hours as needed for mild pain. (Patient taking  differently: Take 325 mg by mouth every 4 (four) hours as needed for mild pain or headache.)    . albuterol (PROAIR HFA) 108 (90 Base) MCG/ACT inhaler INHALE 2 PUFFS INTO THE LUNGS EVERY 4 HOURS AS NEEDED FOR COUGH OR WHEEZE. MAY USE 2 PUFFS 10 TO 20 MINUTES PRIOR TO EXERCISE 8.5 each 0  . AMBULATORY NON FORMULARY MEDICATION Medication Name: Nitroglycerine ointment 0.125 % with 5% Lidocaine Apply a pea sized amount internally three times daily. Dispense 30 GM zero refill 30 g 1  . aspirin-acetaminophen-caffeine (EXCEDRIN MIGRAINE) 250-250-65 MG tablet Take 1 tablet by mouth every 6 (six) hours as needed for headache.    . budesonide-formoterol (SYMBICORT) 160-4.5 MCG/ACT inhaler Inhale 2 puffs into the lungs 2 (two) times daily. Use with spacer 1 Inhaler 5  . Calcium Carbonate-Vitamin D 600-400 MG-UNIT tablet Take 1 tablet by mouth daily.    . Carboxymethylcellul-Glycerin 1-0.9 % GEL Place 1 application into both eyes at bedtime.    . cholecalciferol (VITAMIN D) 1000 UNITS tablet Take 1,000 Units by mouth daily.    [name] Co-Enzyme Q-10 100 MG CAPS Take 100 mg by mouth daily.     [name] doxycycline (VIBRAMYCIN) 100 MG capsule Take 1 capsule (100 mg total) by mouth 2 (two) times daily. 28 capsule 0  . ezetimibe (ZETIA) 10 MG tablet Take 1 tablet (10 mg total) by mouth daily. 90 tablet 3  .  famotidine (PEPCID) 20 MG tablet Take 1 tablet (20 mg total) by mouth daily. 90 tablet 3  . fluocinonide ointment (LIDEX) 0.05 % Apply 1 application topically See admin instructions. Apply topically twice daily for 5 days alternating with Tacrolimus ointment    . fluticasone (FLONASE) 50 MCG/ACT nasal spray USE 1 SPRAY IN EACH NOSTRIL MID-DAY FOR CONGESTION (Patient taking differently: Place 1 spray into both nostrils daily.) 16 g 4  . fluticasone (FLOVENT HFA) 110 MCG/ACT inhaler Inhale 2 puffs into the lungs 2 (two) times daily. 1 Inhaler 1  . ibandronate (BONIVA) 150 MG tablet Take 150 mg by mouth every 30 (thirty) days.  Take in the morning with a full glass of water, on an empty stomach, and do not take anything else by mouth or lie down for the next 30 min.     [name] ipratropium (ATROVENT) 0.03 % nasal spray Place 2 sprays into both nostrils every 4 (four) hours as needed for rhinitis. 30 mL 5  . levETIRAcetam (KEPPRA) 250 MG tablet Take 1 tablet (250 mg total) by mouth 2 (two) times daily. 60 tablet 5  . loratadine (CLARITIN) 10 MG tablet Take 10 mg by mouth daily.    . magnesium oxide (MAG-OX) 400 MG tablet Take 400 mg by mouth at bedtime.    [name] MALIC ACID PO Take 1 capsule by mouth at bedtime.    . methylcellulose (CITRUCEL) oral powder Take 1 packet by mouth daily.    . metoprolol tartrate (LOPRESSOR) 25 MG tablet TAKE 1/2 TABLET BY MOUTH TWICE A DAY 90 tablet 3  . mirabegron ER (MYRBETRIQ) 50 MG TB24 tablet Take 50 mg by mouth daily as needed.     . naproxen sodium (ALEVE) 220 MG tablet Take 220 mg by mouth daily as needed.     . Olopatadine HCl 0.2 % SOLN Apply to eye 2 (two) times daily as needed.    . Omega-3 Fatty Acids (FISH OIL) 1000 MG CAPS Take 1,000 mg by mouth at bedtime.     [name] Phenylephrine-Witch Hazel (HEMORRHOIDAL COOLING) 0.25-50 % GEL Apply topically.    . polyethylene glycol (MIRALAX / GLYCOLAX) 17 g packet Take 17 g by mouth daily.    . pravastatin (PRAVACHOL) 40 MG tablet Take 1 tablet (40 mg total) by mouth daily. 90 tablet 3  . predniSONE (DELTASONE) 10 MG tablet Take prednisone 40mg  daily x 2 days, 30mg  daily x 2 days, 20mg  daily x 2 days and 10mg  daily x 2 days. 20 tablet 0  . senna (SENOKOT) 8.6 MG tablet Take 1 tablet by mouth daily.     . sodium chloride (OCEAN) 0.65 % SOLN nasal spray Place 1 spray into both nostrils as needed for congestion. (Patient taking differently: Place 1 spray into both nostrils 4 (four) times daily as needed for congestion.)  0  . tacrolimus (PROTOPIC) 0.1 % ointment Apply 1 application topically 2 (two) times daily as needed (PRN skin issues). (Patient taking  differently: Apply 1 application topically See admin instructions. Apply topically twice daily for 5 days alternating with Fluocinonide ointment) 100 g 0  . Thiamine HCl (VITAMIN B-1) 100 MG tablet Take 100 mg by mouth daily.    triamcinolone ointment (KENALOG) 0.5 % Apply 1 application topically 2 (two) times daily. For rash on left lower leg 30 g 0  . benzonatate (TESSALON PERLES) 100 MG capsule Take 1 capsule (100 mg total) by mouth 3 (three) times daily as needed for cough. (Patient not taking: Reported  on 11/17/2020) 30 capsule 0   No current facility-administered medications for this visit.     Known medication allergies: Allergies  Allergen Reactions  . Mold Extract [Trichophyton Mentagrophyte] Shortness Of Breath and Rash  . Penicillins Shortness Of Breath, Rash and Other (See Comments)    Eyes puffy Has taken low dose pcn and no rx REACTION: rash, SOB Has patient had a PCN reaction causing immediate rash, facial/tongue/throat swelling, SOB or lightheadedness with hypotension: yes Has patient had a PCN reaction causing severe rash involving mucus membranes or skin necrosis: unk Has patient had a PCN reaction that required hospitalization: no Has patient had a PCN reaction occurring within the last 10 years: unk If all of the above answers are "NO", then may proceed with Cephalospor  . Morphine Other (See Comments)    REACTION: tachycardia and anxiety  . Peanut Oil Nausea And Vomiting    Peanut butter  . Protonix [Pantoprazole Sodium] Nausea And Vomiting  . Atorvastatin     MYAGLIAS  . Citrus Other (See Comments)    Unknown  . Peanut-Containing Drug Products Other (See Comments)    Unknown  . Rosuvastatin     MYALGIAS  . Tramadol Other (See Comments)    Makes crazy;confused  . Valium [Diazepam] Other (See Comments)    Confusion per family  . Cetirizine Rash and Other (See Comments)    Around face  . Codeine Other (See Comments)    REACTION: dizzy and "groggy in my  head"  . Eggs Or Egg-Derived Products Nausea And Vomiting  . Latex Itching  . Pentazocine Lactate Palpitations     Physical examination: Blood pressure 100/76, pulse 72, temperature 97.9 F (36.6 C), resp. rate 14, height 5\' 5"  (1.651 m), weight 164 lb (74.4 kg), SpO2 96 %.  General: Alert, interactive, in no acute distress. HEENT: PERRLA, TMs pearly gray, turbinates mildly edematous with clear discharge, post-pharynx non erythematous. Neck: Supple without lymphadenopathy. Lungs: Clear to auscultation without wheezing, rhonchi or rales. {no increased work of breathing. CV: Normal S1, S2 without murmurs. Abdomen: Nondistended, nontender. Skin: Warm and dry, without lesions or rashes. Extremities:  No clubbing, cyanosis or edema. Neuro:   Grossly intact.  Diagnositics/Labs: None today  Assessment and plan:    Asthma: . Daily controller medication(s): stop Symbicort.  Start Breztri 2 puffs twice a day with spacer and rinse mouth afterwards.  is a triple therapy inhaler that is a step-up from Symbicort for better symptom control . May use albuterol rescue inhaler 2 puffs every 4 to 6 hours as needed for shortness of breath, chest tightness, coughing, and wheezing. May use albuterol rescue inhaler 2 puffs 5 to 15 minutes prior to strenuous physical activities. Monitor frequency of use.  . Asthma control goals:  o Full participation in all desired activities (may need albuterol before activity) o Albuterol use two times or less a week on average (not counting use with activity) o Cough interfering with sleep two times or less a month o Oral steroids no more than once a year o No hospitalizations  Allergic rhinitis Chronic rhinosinusitis:  Continue Xhance 2 sprays per nostril twice a day.  Use nasal ipratropium one spray per nostril twice a day and prior to eating.  Use know more than 4 times a day.   May use nasal saline spray to help keep the nose from getting too  dry  Continue Claritin 10mg  daily and may take it twice a day if needed.   Continue  environmental control measures.  Follow up in 3-4 months or sooner if needed  . I appreciate the opportunity to take part in Nereyda's care. Please do not hesitate to contact me with questions.  Sincerely,   Margo Aye, MD Allergy/Immunology [facility]

## 2020-11-17 NOTE — Patient Instructions (Addendum)
°  Asthma:  Daily controller medication(s): stop Symbicort.  Start Breztri 2 puffs twice a day with spacer and rinse mouth afterwards.  Kara Ramirez is a triple therapy inhaler that is a step-up from Symbicort for better symptom control  May use albuterol rescue inhaler 2 puffs every 4 to 6 hours as needed for shortness of breath, chest tightness, coughing, and wheezing. May use albuterol rescue inhaler 2 puffs 5 to 15 minutes prior to strenuous physical activities. Monitor frequency of use.   Asthma control goals:  o Full participation in all desired activities (may need albuterol before activity) o Albuterol use two times or less a week on average (not counting use with activity) o Cough interfering with sleep two times or less a month o Oral steroids no more than once a year o No hospitalizations  Allergic rhinitis:  Continue Xhance 2 sprays per nostril twice a day.  Use nasal ipratropium one spray per nostril twice a day and prior to eating.  Use know more than 4 times a day.   May use nasal saline spray to help keep the nose from getting too dry  Continue Claritin 10mg  daily and may take it twice a day if needed.   Continue environmental control measures.  Follow up in 3-4 months or sooner if needed  .

## 2020-11-22 ENCOUNTER — Other Ambulatory Visit: Payer: Self-pay

## 2020-11-22 ENCOUNTER — Ambulatory Visit: Payer: PPO | Admitting: Neurology

## 2020-11-22 ENCOUNTER — Encounter: Payer: Self-pay | Admitting: Neurology

## 2020-11-22 VITALS — BP 122/78 | HR 80 | Ht 65.0 in | Wt 166.0 lb

## 2020-11-22 DIAGNOSIS — G253 Myoclonus: Secondary | ICD-10-CM

## 2020-11-22 DIAGNOSIS — R270 Ataxia, unspecified: Secondary | ICD-10-CM | POA: Diagnosis not present

## 2020-11-22 DIAGNOSIS — R251 Tremor, unspecified: Secondary | ICD-10-CM | POA: Diagnosis not present

## 2020-11-22 MED ORDER — DIVALPROEX SODIUM 125 MG PO DR TAB: 125.0000 mg | DELAYED_RELEASE_TABLET | Freq: Two times a day (BID) | ORAL | 1 refills | Status: DC

## 2020-11-22 NOTE — Patient Instructions (Addendum)
Start depakote (valpoic acid) 250 mg, 1/2 tablet twice per day.    We will do a DaT scan. Once the test is approved through your insurance someone from the hospital will contact you to schedule your appointment.   If the DaT scan is normal, we will do some blood work to look at causes for ataxias (balance change and tremor).    The physicians and staff at [facility] are committed to providing excellent care. You may receive a survey requesting feedback about your experience at our office. We strive to receive "very good" responses to the survey questions. If you feel that your experience would prevent you from giving the office a "very good " response, please contact our office to try to remedy the situation. We may be reached at [phone]. Thank you for taking the time out of your busy day to complete the survey.

## 2020-11-30 ENCOUNTER — Encounter (HOSPITAL_COMMUNITY)
Admission: RE | Admit: 2020-11-30 | Discharge: 2020-11-30 | Disposition: A | Discharge status: Home / Self Care | Payer: PPO | Source: Ambulatory Visit | Attending: Neurology | Admitting: Neurology

## 2020-11-30 ENCOUNTER — Other Ambulatory Visit: Payer: Self-pay

## 2020-11-30 ENCOUNTER — Telehealth: Payer: PPO

## 2020-11-30 DIAGNOSIS — R251 Tremor, unspecified: Secondary | ICD-10-CM | POA: Diagnosis not present

## 2020-11-30 DIAGNOSIS — G20 Parkinson's disease: Secondary | ICD-10-CM | POA: Diagnosis not present

## 2020-11-30 MED ORDER — POTASSIUM IODIDE (ANTIDOTE) 130 MG PO TABS
ORAL_TABLET | ORAL | Status: AC
  Administered 2020-11-30: 130 mg via ORAL
  Filled 2020-11-30: qty 1

## 2020-11-30 MED ORDER — POTASSIUM IODIDE (ANTIDOTE) 130 MG PO TABS: 130.0000 mg | ORAL_TABLET | Freq: Once | ORAL | Status: AC

## 2020-11-30 MED ORDER — IOFLUPANE I 123 185 MBQ/2.5ML IV SOLN
4.7000 | Freq: Once | INTRAVENOUS | Status: AC | PRN
  Administered 2020-11-30: 4.7 via INTRAVENOUS
  Filled 2020-11-30: qty 5
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
## 2020-12-13 ENCOUNTER — Telehealth: Payer: Self-pay | Admitting: Allergy

## 2020-12-13 NOTE — Telephone Encounter (Signed)
Patient states she has another sinus infection and is having a hard time breathing. Patient states she gets like this every time it snows. Patient would like something called into her pharmacy, [facility] [address].   Please advise.

## 2020-12-14 ENCOUNTER — Other Ambulatory Visit: Payer: Self-pay

## 2020-12-14 MED ORDER — DOXYCYCLINE MONOHYDRATE 100 MG PO TABS: 100.0000 mg | ORAL_TABLET | Freq: Two times a day (BID) | ORAL | 0 refills | Status: DC

## 2020-12-14 NOTE — Telephone Encounter (Signed)
She tends to respond well to Doxycyline 100mg  twice a day x 10 days.  No refills.   Not sure why her urine is discolored.  She needs to make sure she is staying well hydrated and if continues should discuss with her PCP.

## 2020-12-14 NOTE — Telephone Encounter (Signed)
Pt understands and will follow up with pcp about discolored urine

## 2020-12-14 NOTE — Telephone Encounter (Signed)
Pt has a stuffy nose, and a headache. Clearing throat pt states it gold color phlegm, her pee is also discolored. She is taking Claritin, Flonase, iprotri na, nasal saline, pt has previously tried mucinex, pt does albuterol, breztri, face feels sore and swollen

## 2020-12-22 ENCOUNTER — Other Ambulatory Visit: Payer: Self-pay

## 2020-12-22 ENCOUNTER — Ambulatory Visit (INDEPENDENT_AMBULATORY_CARE_PROVIDER_SITE_OTHER): Payer: PPO | Admitting: Pharmacist

## 2020-12-22 DIAGNOSIS — E782 Mixed hyperlipidemia: Secondary | ICD-10-CM | POA: Diagnosis not present

## 2020-12-22 DIAGNOSIS — J4531 Mild persistent asthma with (acute) exacerbation: Secondary | ICD-10-CM

## 2020-12-22 DIAGNOSIS — I480 Paroxysmal atrial fibrillation: Secondary | ICD-10-CM
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
## 2020-12-22 NOTE — Progress Notes (Deleted)
12/14/20 Dr Nelva Bush (telephone): rx'd doxycycline for sinus infection.  11/22/20 Dr Tat (neurology): start divalproex 125 mg BID for tremor (1/2 previous dose)  11/17/20 Dr Nelva Bush (allergy/asthma): stop Symbicort, start Breztri.

## 2020-12-23 NOTE — Patient Instructions (Signed)
Visit Information  Phone number for Pharmacist: [phone]  Goals Addressed            This Visit's Progress   . Improve My Heart Health-Coronary Artery Disease       Timeframe:  Long-Range Goal Priority:  High Start Date:      12/23/20                       Expected End Date:     03/22/21                  Follow Up Date 03/22/21    - be open to making changes - I can manage, know and watch for signs of a heart attack - if I have chest pain, call for help  -follow up with cardiology to make sure cholesterol medicines are working   Why is this important?    Lifestyle changes are key to improving the blood flow to your heart. Think about the things you can change and set a goal to live healthy.   Remember, when the blood vessels to your heart start to get clogged you may not have any symptoms.   Over time, they can get worse.   Don't ignore the signs, like chest pain, and get help right away.         Patient Care Plan: CCM Pharmacy Care Plan    Problem Identified: Atrial Fibrillation, CAD, HLD and Asthma/Allergies   Priority: High    Goal: Patient-Specific Goal   Start Date: 12/23/2020  Expected End Date: 06/22/2021  This Visit's Progress: On track  Priority: High  Note:    AFIB   Dx 08/2017 from event monitor, brief runs of PAF. Patient is currently rhythm controlled. BP goal is:  <130/80  Patient checks BP at home infrequently Patient home BP readings are ranging: n/a  Patient has failed these meds in past: Eliquis, amiodarone Patient is currently controlled on the following medications:  [name] Metoprolol tartrate 25 mg - 1/2 tab BID  We discussed:  Role of beta blocker in Afib; effect on BP and HR; encouraged pt to monitor at home for hypotension and bradycardia  Plan  Continue current medications  Hyperlipidemia / CAD   LDL goal < 100 Coronary CTA 11/2017: Ca score 125, mild nonobstructive CAD  Patient has failed these meds in past: atorvastatin,  rosuvastatin  Patient is currently uncontrolled on the following medications:  . Pravastatin 40 mg daily HS . Ezetimibe 10 mg daily . Coenzyme Q10 100 mg daily  (fibromyalgia) . OTC fish oil 1000 mg daily  We discussed:  Pt saw lipid clinic in November and ezetimibe was added; she was told to get repeat lipid panel around January - she has not heard anything from cardiology office yet; per chart there is not a lipid panel ordered; will coordinate with cardiology office for repeat lipid panel   Plan  Continue current medications and control with diet and exercise  Coordinate repeat lipid panel with cardiology office  Asthma / Allergies   Last spirometry: 05/13/20 - normal function Patient has failed these meds in past: n/a Patient is currently controlled on the following medications:  . Albuterol HFA prn . Breztri - 2 puffs BID . Ipratropium 0.3% nasal spray PRN . Fluticasone nasal spray PRN (XHANCE) . Loratadine 10 mg daily . NaCL 0.65% nasal spray PRN . Olopatadine 0.2% eye drops BID prn  Using maintenance inhaler regularly? Yes Frequency of rescue inhaler use:  daily  We discussed:  proper inhaler technique; pt currently struggling with allergy symptoms due to weather  Plan  Continue current medications     The patient verbalized understanding of instructions, educational materials, and care plan provided today and declined offer to receive copy of patient instructions, educational materials, and care plan.  Telephone follow up appointment with pharmacy team member scheduled for: 3 months  Charlene Brooke, PharmD, [place] Clinical Pharmacist [facility] at [facility] [phone]

## 2020-12-26 ENCOUNTER — Other Ambulatory Visit: Payer: Self-pay | Admitting: Pharmacist Clinician (PhC)/ Clinical Pharmacy Specialist

## 2020-12-26 DIAGNOSIS — E782 Mixed hyperlipidemia: Secondary | ICD-10-CM

## 2020-12-29 ENCOUNTER — Other Ambulatory Visit: Payer: Self-pay | Admitting: Allergy & Immunology

## 2020-12-29 NOTE — Telephone Encounter (Signed)
I called the patient and she said that her urine discoloration cleared up with the use of the doxycycline padgett sent in on 12/14/20. She has been having shortness of breath, drainage and a cough. She is almost out of her albuterol so I'm going to sent that in. Just wanted to see if you would advise her to come in earlier than 03/22/21.

## 2021-01-02 DIAGNOSIS — E782 Mixed hyperlipidemia: Secondary | ICD-10-CM | POA: Diagnosis not present

## 2021-01-03 LAB — HEPATIC FUNCTION PANEL
ALT: 42 IU/L — ABNORMAL HIGH (ref 0–32)
AST: 37 IU/L (ref 0–40)
Albumin: 4.8 g/dL (ref 3.8–4.8)
Alkaline Phosphatase: 63 IU/L (ref 44–121)
Bilirubin Total: 0.5 mg/dL (ref 0.0–1.2)
Bilirubin, Direct: 0.13 mg/dL (ref 0.00–0.40)
Total Protein: 7 g/dL (ref 6.0–8.5)

## 2021-01-03 LAB — LIPID PANEL
Chol/HDL Ratio: 2.6 ratio (ref 0.0–4.4)
Cholesterol, Total: 166 mg/dL (ref 100–199)
HDL: 65 mg/dL (ref >39)
LDL Chol Calc (NIH): 81 mg/dL (ref 0–99)
Triglycerides: 116 mg/dL (ref 0–149)
VLDL Cholesterol Cal: 20 mg/dL (ref 5–40)

## 2021-01-20 ENCOUNTER — Encounter: Payer: Self-pay | Admitting: Nurse Practitioner

## 2021-01-20 ENCOUNTER — Other Ambulatory Visit: Payer: Self-pay

## 2021-01-20 ENCOUNTER — Ambulatory Visit: Payer: PPO | Admitting: Nurse Practitioner

## 2021-01-20 VITALS — BP 110/62 | HR 84 | Ht 65.0 in | Wt 162.0 lb

## 2021-01-20 DIAGNOSIS — K5900 Constipation, unspecified: Secondary | ICD-10-CM
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
## 2021-01-20 NOTE — Patient Instructions (Signed)
If you are age 71 or older, your body mass index should be between 23-30. Your Body mass index is 26.96 kg/m. If this is out of the aforementioned range listed, please consider follow up with your Primary Care Provider.  RECOMMENDATIONS:  OVER THE COUNTER MEDICATION Please purchase the following medications over the counter and take as directed:  Glycerin suppositories 1-2 times a day. If no results after a week, then change to Dulcolax enema every morning.  It was great seeing you today! Thank you for entrusting me with your care and choosing [facility].  Tye Savoy, NP

## 2021-01-22 NOTE — Progress Notes (Signed)
Agree with assessment and plan as outlined. She has an up to date colonoscopy, has been to seen pelvic floor PT, and is on a good bowel regimen. If symptoms persist despite regimen as outlined can consider further evaluation with defacography study.

## 2021-01-27 ENCOUNTER — Other Ambulatory Visit: Payer: Self-pay
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
## 2021-01-31 ENCOUNTER — Other Ambulatory Visit: Payer: Self-pay

## 2021-01-31 ENCOUNTER — Encounter: Payer: Self-pay | Admitting: Internal Medicine

## 2021-01-31 ENCOUNTER — Ambulatory Visit (INDEPENDENT_AMBULATORY_CARE_PROVIDER_SITE_OTHER): Payer: PPO | Admitting: Internal Medicine

## 2021-01-31 VITALS — BP 112/82 | HR 102 | Temp 98.0°F | Ht 65.0 in | Wt 161.0 lb

## 2021-01-31 DIAGNOSIS — R2689 Other abnormalities of gait and mobility: Secondary | ICD-10-CM

## 2021-01-31 DIAGNOSIS — R42 Dizziness and giddiness: Secondary | ICD-10-CM

## 2021-01-31 NOTE — Patient Instructions (Addendum)
Follow up with Dr Gwenlyn Found for your chest pain and dizziness.   Call Dr Tat to discuss if the medications she is prescribing could be causing your dizziness.  Ask her if the dizziness is neurological.   Follow up with your eye doctor about your dizziness.      Consider physical therapy to help improve your strength and balance.

## 2021-01-31 NOTE — Assessment & Plan Note (Signed)
Chronic Her balance continues to be poor-she walks with a cane She is deconditioned We discussed physical therapy and she agrees it would be a good idea, but would like to think about it-she will call when and if she wants a referral
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
## 2021-02-02 ENCOUNTER — Telehealth: Payer: Self-pay | Admitting: Neurology

## 2021-02-02 NOTE — Telephone Encounter (Signed)
That wouldn't be from the depakote.

## 2021-02-02 NOTE — Telephone Encounter (Signed)
Patient is really dizzy and was told by PCP that it could be coming from the medication that Tat has her on. She is on Depakote. Please call patient she has appt with Tat in June

## 2021-02-02 NOTE — Telephone Encounter (Signed)
Pt called no answer left a voice mail for her to call the office back

## 2021-02-03 ENCOUNTER — Ambulatory Visit: Payer: PPO | Admitting: General Practice

## 2021-02-03 ENCOUNTER — Other Ambulatory Visit: Payer: Self-pay | Admitting: Neurology

## 2021-02-03 ENCOUNTER — Other Ambulatory Visit: Payer: Self-pay

## 2021-02-03 ENCOUNTER — Encounter: Payer: Self-pay | Admitting: General Practice

## 2021-02-03 VITALS — BP 110/70 | Ht 66.0 in | Wt 162.8 lb

## 2021-02-03 DIAGNOSIS — I480 Paroxysmal atrial fibrillation: Secondary | ICD-10-CM

## 2021-02-03 DIAGNOSIS — R42 Dizziness and giddiness: Secondary | ICD-10-CM | POA: Diagnosis not present

## 2021-02-03 DIAGNOSIS — E782 Mixed hyperlipidemia: Secondary | ICD-10-CM
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
## 2021-02-03 NOTE — Telephone Encounter (Signed)
Patient called in returning Heather's call 

## 2021-02-03 NOTE — Patient Instructions (Signed)
Medication Instructions:  The current medical regimen is effective;  continue present plan and medications as directed. Please refer to the Current Medication list given to you today.  *If you need a refill on your cardiac medications before your next appointment, please call your pharmacy*  Lab Work:   Testing/Procedures:  NONE    NONE  Follow-Up: Your next appointment:  KEEP SCHEDULED APPOINTMENT  In Person with Quay Burow, MD   At [facility], you and your health needs are our priority.  As part of our continuing mission to provide you with exceptional heart care, we have created designated Provider Care Teams.  These Care Teams include your primary Cardiologist (physician) and Advanced Practice Providers (APPs -  Physician Assistants and Nurse Practitioners) who all work together to provide you with the care you need, when you need it.

## 2021-02-03 NOTE — Telephone Encounter (Signed)
Spoke with patient and gave her Dr Doristine Devoid recommendations. She voiced understanding.

## 2021-02-06 DIAGNOSIS — S069X9A Unspecified intracranial injury with loss of consciousness of unspecified duration, initial encounter: Secondary | ICD-10-CM | POA: Diagnosis not present

## 2021-02-06 DIAGNOSIS — H532 Diplopia: Secondary | ICD-10-CM | POA: Diagnosis not present

## 2021-02-06 DIAGNOSIS — H4912 Fourth [trochlear] nerve palsy, left eye: Secondary | ICD-10-CM | POA: Diagnosis not present

## 2021-03-02 ENCOUNTER — Other Ambulatory Visit: Payer: Self-pay
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
## 2021-03-06 ENCOUNTER — Encounter: Payer: Self-pay | Admitting: Internal Medicine

## 2021-03-06 ENCOUNTER — Ambulatory Visit (INDEPENDENT_AMBULATORY_CARE_PROVIDER_SITE_OTHER): Payer: PPO | Admitting: Internal Medicine

## 2021-03-06 ENCOUNTER — Other Ambulatory Visit: Payer: Self-pay

## 2021-03-06 VITALS — Temp 98.7°F | Ht 66.0 in | Wt 160.0 lb

## 2021-03-06 DIAGNOSIS — R42 Dizziness and giddiness: Secondary | ICD-10-CM | POA: Diagnosis not present

## 2021-03-06 DIAGNOSIS — R270 Ataxia, unspecified: Secondary | ICD-10-CM | POA: Diagnosis not present

## 2021-03-06 DIAGNOSIS — R2689 Other abnormalities of gait and mobility: Secondary | ICD-10-CM

## 2021-03-06 NOTE — Assessment & Plan Note (Signed)
Chronic, intermittent She is a poor historian, but it sounds like this is a combination of some lightheadedness and dizziness Possibly related to TBI/ataxia Has some orthostatic components to it, but BPs measured today and do not show any orthostasis Referred for physical therapy Will discuss metoprolol with cardiology Continue increased fluids

## 2021-03-06 NOTE — Patient Instructions (Addendum)
    A referral was ordered for physical therapy at [facility].     Someone from their office will call you to schedule an appointment.     Please followup in July as scheduled

## 2021-03-06 NOTE — Assessment & Plan Note (Signed)
Neck Related to ataxia Following with Dr. Carles Collet Referred for outpatient PT Encouraged her to use her walker

## 2021-03-06 NOTE — Assessment & Plan Note (Signed)
Chronic She complains of chronic intermittent dizziness/lightheadedness Some of the lightheadedness seems to be orthostatic in nature after standing Blood pressures here do not reveal orthostasis Continue to push water intake She does use caution when she stands Has follow-up with cardiology

## 2021-03-06 NOTE — Assessment & Plan Note (Signed)
Chronic Following with Dr Carles Collet Will refer to outpatient PT

## 2021-03-09 DIAGNOSIS — S069X1D Unspecified intracranial injury with loss of consciousness of 30 minutes or less, subsequent encounter: Secondary | ICD-10-CM | POA: Diagnosis not present

## 2021-03-10 ENCOUNTER — Other Ambulatory Visit: Payer: Self-pay | Admitting: Student

## 2021-03-10 DIAGNOSIS — S069X1D Unspecified intracranial injury with loss of consciousness of 30 minutes or less, subsequent encounter: Secondary | ICD-10-CM

## 2021-03-13 ENCOUNTER — Ambulatory Visit
Admission: RE | Admit: 2021-03-13 | Discharge: 2021-03-13 | Disposition: A | Discharge status: Home / Self Care | Payer: PPO | Source: Ambulatory Visit | Attending: Student | Admitting: Student

## 2021-03-13 DIAGNOSIS — S069X1D Unspecified intracranial injury with loss of consciousness of 30 minutes or less, subsequent encounter: Secondary | ICD-10-CM

## 2021-03-13 DIAGNOSIS — R42 Dizziness and giddiness: Secondary | ICD-10-CM | POA: Diagnosis not present

## 2021-03-13 DIAGNOSIS — G9389 Other specified disorders of brain: Secondary | ICD-10-CM | POA: Diagnosis not present

## 2021-03-13 DIAGNOSIS — R251 Tremor, unspecified: Secondary | ICD-10-CM | POA: Diagnosis not present

## 2021-03-17 ENCOUNTER — Other Ambulatory Visit: Payer: Self-pay

## 2021-03-17 ENCOUNTER — Ambulatory Visit (HOSPITAL_BASED_OUTPATIENT_CLINIC_OR_DEPARTMENT_OTHER): Payer: PPO | Attending: Internal Medicine | Admitting: Physical Therapy

## 2021-03-17 ENCOUNTER — Encounter (HOSPITAL_BASED_OUTPATIENT_CLINIC_OR_DEPARTMENT_OTHER): Payer: Self-pay | Admitting: Physical Therapy

## 2021-03-17 DIAGNOSIS — R293 Abnormal posture: Secondary | ICD-10-CM | POA: Diagnosis not present

## 2021-03-17 DIAGNOSIS — R279 Unspecified lack of coordination: Secondary | ICD-10-CM | POA: Diagnosis not present

## 2021-03-17 DIAGNOSIS — M6281 Muscle weakness (generalized): Secondary | ICD-10-CM | POA: Diagnosis not present
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
## 2021-03-22 ENCOUNTER — Ambulatory Visit: Payer: PPO | Admitting: Allergy

## 2021-03-22 ENCOUNTER — Encounter: Payer: Self-pay | Admitting: Allergy

## 2021-03-22 ENCOUNTER — Telehealth: Payer: PPO

## 2021-03-22 ENCOUNTER — Other Ambulatory Visit: Payer: Self-pay

## 2021-03-22 VITALS — BP 100/62 | HR 76 | Temp 97.3°F | Resp 16

## 2021-03-22 DIAGNOSIS — J302 Other seasonal allergic rhinitis: Secondary | ICD-10-CM | POA: Diagnosis not present

## 2021-03-22 DIAGNOSIS — S0990XA Unspecified injury of head, initial encounter: Secondary | ICD-10-CM | POA: Diagnosis not present

## 2021-03-22 DIAGNOSIS — J329 Chronic sinusitis, unspecified: Secondary | ICD-10-CM

## 2021-03-22 DIAGNOSIS — J310 Chronic rhinitis: Secondary | ICD-10-CM

## 2021-03-22 DIAGNOSIS — J4530 Mild persistent asthma, uncomplicated: Secondary | ICD-10-CM | POA: Diagnosis not present

## 2021-03-22 DIAGNOSIS — J3089 Other allergic rhinitis: Secondary | ICD-10-CM

## 2021-03-22 MED ORDER — LEVOCETIRIZINE DIHYDROCHLORIDE 5 MG PO TABS: 5.0000 mg | ORAL_TABLET | Freq: Every evening | ORAL | 5 refills | Status: DC

## 2021-03-22 NOTE — Patient Instructions (Addendum)
  Asthma: . Daily controller medication(s):  Breztri 133mcg 2 puffs twice a day with spacer and rinse mouth afterwards.  Kara Ramirez is a triple therapy inhaler that is a step-up from Symbicort for better symptom control . May use albuterol rescue inhaler 2 puffs every 4 to 6 hours as needed for shortness of breath, chest tightness, coughing, and wheezing. May use albuterol rescue inhaler 2 puffs 5 to 15 minutes prior to strenuous physical activities. Monitor frequency of use.  . Asthma control goals:  o Full participation in all desired activities (may need albuterol before activity) o Albuterol use two times or less a week on average (not counting use with activity) o Cough interfering with sleep two times or less a month o Oral steroids no more than once a year o No hospitalizations  Allergic rhinitis:  Use Xhance 2 sprays per nostril twice a day to help with sinus congestion control (this can help improve dizziness if related to sinus congestion)  Use nasal ipratropium one spray per nostril twice a day and prior to eating.  Use know more than 4 times a day.   May use nasal saline spray to help keep the nose from getting too dry  Hold Claritin for next week and see if you note any improvement in dizziness while off.  Replace with Xyzal 5mg  daily.    Continue environmental control measures.  Follow up in 3-4 months or sooner if needed  .

## 2021-03-22 NOTE — Progress Notes (Deleted)
Chronic Care Management Pharmacy Note  03/22/2021 Name:  Kara Ramirez MRN:  [id number] DOB:  Mar 08, 1951  Subjective: Kara Ramirez is an 70 y.o. year old female who is a primary patient of Burns, Claudina Lick, MD.  The CCM team was consulted for assistance with disease management and care coordination needs.    {CCMTELEPHONEFACETOFACE:21091510} for {CCMINITIALFOLLOWUPCHOICE:21091511} in response to provider referral for pharmacy case management and/or care coordination services.   Consent to Services:  {CCMCONSENTOPTIONS:25074}  Patient Care Team: Binnie Rail, MD as PCP - General (Internal Medicine) Lorretta Harp, MD as PCP - Cardiology (Cardiology) Lorretta Harp, MD as Consulting Physician (Cardiology) Rana Snare, MD as Consulting Physician (Urology) Kennith Gain, MD as Consulting Physician (Allergy) Tat, Eustace Quail, DO as Consulting Physician (Neurology) Charlton Haws, [facility] as Pharmacist (Pharmacist)  Recent office visits: 03/06/21 Dr Quay Burow OV: f/u dizziness. Pt has seen specialists and no one has explanation for dizziness (not related to cardiac, eyes, or depakote). Ordered PT. Discuss metoprolol w/ cardiology  01/31/21 Dr Quay Burow OV: acute visit for dizziness. Considering PT. Does not seem to be BPPV. Advised to f/u with neurologist re: depakote dosing and cardiologist or chest pain/dizziness.  Recent consult visits: 03/22/21 Dr Nelva Bush (allergy): advised to hold claritin for a week and monitor dizziness. Replace w/ Xyzal.   02/03/21 NP Cleaver (cardiology): f/u dizziness. Appears related to ophthalmic issues. Advised hydration w/ sports drink.   01/20/21 NP Chester Holstein (GI): f/u constipation. Continue high fiber, daily Miralax. Trial glycerin suppositories, then Dulcolax enema if needed.  11/22/20 Dr Tat (neurology): start divalproex 125 mg BID for tremor (1/2 previous dose)  11/17/20 Dr Nelva Bush (allergy/asthma): stop Symbicort, start Breztri.  10/03/20  Dr Maudie Mercury (allergy): treat sinus infection, rx'd doxycycline and prednisone.  09/20/20 Dr Brion Aliment PharmD (lipid clinic): pt could not tolerate rosuvastatin. changed pravastatin to bedtime and added ezetimibe 10 mg. Recheck lipids 3 mos.  Hospital visits: None in previous 6 months  Objective:  Lab Results  Component Value Date   CREATININE 0.95 04/25/2020   BUN 14 04/25/2020   GFR 70.99 12/14/2019   GFRNONAA >60 04/25/2020   GFRAA >60 04/25/2020   NA 139 04/25/2020   K 3.4 (L) 04/25/2020   CALCIUM 10.0 04/25/2020   CO2 24 04/25/2020   GLUCOSE 101 (H) 04/25/2020    Lab Results  Component Value Date/Time   HGBA1C 5.8 12/14/2019 09:42 AM   HGBA1C 5.7 06/04/2019 10:04 AM   GFR 70.99 12/14/2019 09:42 AM   GFR 66.29 06/04/2019 10:04 AM    Last diabetic Eye exam: No results found for: HMDIABEYEEXA  Last diabetic Foot exam: No results found for: HMDIABFOOTEX   Lab Results  Component Value Date   CHOL 166 01/02/2021   HDL 65 01/02/2021   LDLCALC 81 01/02/2021   TRIG 116 01/02/2021   CHOLHDL 2.6 01/02/2021    Hepatic Function Latest Ref Rng & Units 01/02/2021 08/31/2020 04/25/2020  Total Protein 6.0 - 8.5 g/dL 7.0 7.0 7.3  Albumin 3.8 - 4.8 g/dL 4.8 4.7 4.3  AST 0 - 40 IU/L 37 25 29  ALT 0 - 32 IU/L 42(H) 24 33  Alk Phosphatase 44 - 121 IU/L 63 57 48  Total Bilirubin 0.0 - 1.2 mg/dL 0.5 0.4 0.7  Bilirubin, Direct 0.00 - 0.40 mg/dL 0.13 0.12 -    Lab Results  Component Value Date/Time   TSH 0.54 06/04/2019 10:04 AM   TSH 0.98 12/12/2018 10:05 AM    CBC Latest Ref Rng &  Units 04/25/2020 08/11/2019 06/04/2019  WBC 4.0 - 10.5 K/uL 8.6 8.6 5.9  Hemoglobin 12.0 - 15.0 g/dL 13.9 13.4 12.8  Hematocrit 36.0 - 46.0 % 43.1 41.1 38.7  Platelets 150 - 400 K/uL 248 - 284.0    No results found for: VD25OH  Clinical ASCVD: No  The 10-year ASCVD risk score Mikey Bussing DC Jr., et al., 2013) is: 13.5%   Values used to calculate the score:     Age: 74 years     Sex: Female     Is  Non-Hispanic African American: No     Diabetic: Yes     Tobacco smoker: No     Systolic Blood Pressure: 774 mmHg     Is BP treated: Yes     HDL Cholesterol: 65 mg/dL     Total Cholesterol: 166 mg/dL    Depression screen [facility] 2/9 09/02/2020 12/14/2019 12/12/2018  Decreased Interest 0 0 1  Down, Depressed, Hopeless 0 0 0  PHQ - 2 Score 0 0 1  Altered sleeping - - 1  Tired, decreased energy - - 1  Change in appetite - - 1  Feeling bad or failure about yourself  - - 0  Trouble concentrating - - 2  Moving slowly or fidgety/restless - - 0  Suicidal thoughts - - 0  PHQ-9 Score - - 6  Some recent data might be hidden     CHA2DS2-VASc Score = 2  The patient's score is based upon: CHF History: No HTN History: No Diabetes History: No Stroke History: No Vascular Disease History: No Age Score: 1 Gender Score: 1   {Confirm score is correct.  If not, click here to update score.  REFRESH note. :[id number]}   Social History   Tobacco Use  Smoking Status Never Smoker  Smokeless Tobacco Never Used   BP Readings from Last 3 Encounters:  03/22/21 100/62  02/03/21 110/70  01/31/21 112/82   Pulse Readings from Last 3 Encounters:  03/22/21 76  01/31/21 (!) 102  01/20/21 84   Wt Readings from Last 3 Encounters:  03/06/21 160 lb (72.6 kg)  02/03/21 162 lb 12.8 oz (73.8 kg)  01/31/21 161 lb (73 kg)   BMI Readings from Last 3 Encounters:  03/06/21 25.82 kg/m  02/03/21 26.28 kg/m  01/31/21 26.79 kg/m    Assessment/Interventions: Review of patient past medical history, allergies, medications, health status, including review of consultants reports, laboratory and other test data, was performed as part of comprehensive evaluation and provision of chronic care management services.   SDOH:  (Social Determinants of Health) assessments and interventions performed: Yes  SDOH Screenings   Alcohol Screen: Low Risk   . Last Alcohol Screening Score (AUDIT): 0  Depression (PHQ2-9): Low  Risk   . PHQ-2 Score: 0  Financial Resource Strain: Low Risk   . Difficulty of Paying Living Expenses: Not hard at all  Food Insecurity: No Food Insecurity  . Worried About Charity fundraiser in the Last Year: Never true  . Ran Out of Food in the Last Year: Never true  Housing: Low Risk   . Last Housing Risk Score: 0  Physical Activity: Sufficiently Active  . Days of Exercise per Week: 5 days  . Minutes of Exercise per Session: 30 min  Social Connections: Not on file  Stress: No Stress Concern Present  . Feeling of Stress : Not at all  Tobacco Use: Low Risk   . Smoking Tobacco Use: Never Smoker  . Smokeless Tobacco Use: Never  Used  Transportation Needs: No Transportation Needs  . Lack of Transportation (Medical): No  . Lack of Transportation (Non-Medical): No    CCM Care Plan  Allergies  Allergen Reactions  . Mold Extract [Trichophyton Mentagrophyte] Shortness Of Breath and Rash  . Penicillins Shortness Of Breath, Rash and Other (See Comments)    Eyes puffy Has taken low dose pcn and no rx REACTION: rash, SOB Has patient had a PCN reaction causing immediate rash, facial/tongue/throat swelling, SOB or lightheadedness with hypotension: yes Has patient had a PCN reaction causing severe rash involving mucus membranes or skin necrosis: unk Has patient had a PCN reaction that required hospitalization: no Has patient had a PCN reaction occurring within the last 10 years: unk If all of the above answers are "NO", then may proceed with Cephalospor  . Morphine Other (See Comments)    REACTION: tachycardia and anxiety  . Peanut Oil Nausea And Vomiting    Peanut butter  . Protonix [Pantoprazole Sodium] Nausea And Vomiting  . Atorvastatin     MYAGLIAS  . Citrus Other (See Comments)    Unknown  . Peanut-Containing Drug Products Other (See Comments)    Unknown  . Rosuvastatin     MYALGIAS  . Tramadol Other (See Comments)    Makes crazy;confused  . Valium [Diazepam] Other (See  Comments)    Confusion per family  . Cetirizine Rash and Other (See Comments)    Around face  . Codeine Other (See Comments)    REACTION: dizzy and "groggy in my head"  . Eggs Or Egg-Derived Products Nausea And Vomiting  . Latex Itching  . Pentazocine Lactate Palpitations    Medications Reviewed Today    Reviewed by Derl Barrow, CMA (Certified Medical Assistant) on 03/22/21 at 1123  Med List Status: <None>  Medication Order Taking? Sig Documenting Provider Last Dose Status Informant  acetaminophen (TYLENOL) 325 MG tablet [id number] Yes Take 1-2 tablets (325-650 mg total) by mouth every 4 (four) hours as needed for mild pain.  Patient taking differently: Take 325 mg by mouth every 4 (four) hours as needed for mild pain or headache.   Bary Leriche, PA-C Taking Active   albuterol (VENTOLIN HFA) 108 (90 Base) MCG/ACT inhaler [id number] Yes INHALE 1 TO 2 PUFF(S) BY MOUTH INTO THE LUNGS EVERY 6 HOURS AS NEEDED FOR WHEEZING OR SHORTNESS OF BREATH Valentina Shaggy, MD Taking Active   aspirin-acetaminophen-caffeine [facility] MIGRAINE) [phone] MG tablet [id number] Yes Take 1 tablet by mouth every 6 (six) hours as needed for headache. [provider] Taking Active Self  Budeson-Glycopyrrol-Formoterol (BREZTRI AEROSPHERE) 160-9-4.8 MCG/ACT AERO [id number] Yes Inhale 2 puffs into the lungs in the morning and at bedtime. [provider] Taking Active   Calcium Carbonate-Vitamin D 600-400 MG-UNIT tablet [id number] Yes Take 1 tablet by mouth daily. [provider] Taking Active Self  Carboxymethylcellul-Glycerin 1-0.9 % GEL [id number] Yes Place 1 application into both eyes at bedtime. [provider] Taking Active Self  cholecalciferol (VITAMIN D) 1000 UNITS tablet [id number] Yes Take 1,000 Units by mouth daily. [provider] Taking Active Self  Co-Enzyme Q-10 100 MG CAPS [id number] Yes Take 100 mg by mouth daily.  [provider] Taking  Active Self  divalproex (DEPAKOTE) 125 MG DR tablet [id number] No Take 1 tablet (125 mg total) by mouth 2 (two) times daily.  Patient not taking: Reported on 03/22/2021   Tat, Eustace Quail, DO Not Taking Active   ezetimibe (ZETIA) 10 MG tablet [id number]  Take  1 tablet (10 mg total) by mouth daily. Lorretta Harp, MD  Expired 12/19/20 2359   famotidine (PEPCID) 20 MG tablet [id number] Yes Take 1 tablet (20 mg total) by mouth daily. Yetta Flock, MD Taking Active   fluocinonide ointment (LIDEX) 0.05 % [id number] Yes Apply 1 application topically See admin instructions. Apply topically twice daily for 5 days alternating with Tacrolimus ointment [provider] Taking Active Self  fluticasone (FLONASE) 50 MCG/ACT nasal spray [id number] Yes USE 1 SPRAY IN EACH NOSTRIL MID-DAY FOR CONGESTION  Patient taking differently: Place 1 spray into both nostrils daily.   Kennith Gain, MD Taking Active   ibandronate [facility]) 150 MG tablet [id number] Yes Take 150 mg by mouth every 30 (thirty) days. Take in the morning with a full glass of water, on an empty stomach, and do not take anything else by mouth or lie down for the next 30 min.  [provider] Taking Active   ipratropium (ATROVENT) 0.03 % nasal spray [id number] Yes Place 2 sprays into both nostrils every 4 (four) hours as needed for rhinitis. Kennith Gain, MD Taking Active   loratadine (CLARITIN) 10 MG tablet [id number] Yes Take 10 mg by mouth daily. [provider] Taking Active   magnesium oxide (MAG-OX) 400 MG tablet [id number] Yes Take 400 mg by mouth at bedtime. [provider] Taking Active   MALIC ACID PO [id number] Yes Take 1 capsule by mouth at bedtime. [provider] Taking Active Self  methylcellulose (CITRUCEL) oral powder [id number] Yes Take 1 packet by mouth daily. Yetta Flock, MD Taking Active   metoprolol tartrate (LOPRESSOR) 25 MG tablet [id number] Yes TAKE 1/2  TABLET BY MOUTH TWICE A DAY Lorretta Harp, MD Taking Active   mirabegron ER (MYRBETRIQ) 50 MG TB24 tablet [id number] Yes Take 50 mg by mouth daily as needed.  [provider] Taking Active   naproxen sodium (ALEVE) 220 MG tablet [id number] Yes Take 220 mg by mouth daily as needed.  [provider] Taking Active Self  Olopatadine HCl 0.2 % SOLN [id number] Yes Apply to eye 2 (two) times daily as needed. [provider] Taking Active   Omega-3 Fatty Acids (FISH OIL) 1000 MG CAPS [id number] Yes Take 1,000 mg by mouth at bedtime.  [provider] Taking Active Self  Phenylephrine-Witch Hazel (HEMORRHOIDAL COOLING) 0.25-50 % GEL [id number] Yes Apply topically. [provider] Taking Active   polyethylene glycol (MIRALAX / GLYCOLAX) 17 g packet [id number] Yes Take 17 g by mouth daily. [provider] Taking Active   pravastatin (PRAVACHOL) 40 MG tablet [id number] Yes Take 1 tablet (40 mg total) by mouth daily. Lorretta Harp, MD Taking Active   senna [facility]) 8.6 MG tablet [id number] Yes Take 1 tablet by mouth daily.  [provider] Taking Active   sodium chloride (OCEAN) 0.65 % SOLN nasal spray [id number] Yes Place 1 spray into both nostrils as needed for congestion.  Patient taking differently: Place 1 spray into both nostrils 4 (four) times daily as needed for congestion.   Bary Leriche, PA-C Taking Active   tacrolimus (PROTOPIC) 0.1 % ointment [id number] Yes Apply 1 application topically 2 (two) times daily as needed (PRN skin issues).  Patient taking differently: Apply 1 application topically See admin instructions. Apply topically twice daily for 5 days alternating with Fluocinonide ointment   Love, Ivan Anchors, PA-C Taking Active Self  Thiamine HCl (VITAMIN B-1) 100 MG tablet [id number] Yes Take 100 mg by mouth daily.  [provider] Taking Active Self  triamcinolone ointment (KENALOG) 0.5 % [id number] Yes Apply 1 application  topically 2 (two) times daily. For rash on left lower leg Binnie Rail, MD Taking Active           Patient Active Problem List   Diagnosis Date Noted  . Ataxia 03/06/2021  . Intermittent lightheadedness 03/06/2021  . Acute sinusitis 10/03/2020  . Mild persistent asthma with acute exacerbation 10/03/2020  . Seasonal and perennial allergic rhinitis 10/03/2020  . Nummular eczema 06/13/2020  . Dizziness 04/26/2020  . Headache 04/26/2020  . Fatigue 04/26/2020  . Vaginal prolapse 12/17/2018  . Jerking 09/22/2018  . Closed head injury 09/11/2018  . Mild intermittent asthma without complication 98/33/8250  . Multinodular thyroid, follow up US in 05/2019 06/10/2018  . Fibromyalgia 05/26/2018  . 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 to 5 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 ([place]) 03/25/2018  . Coccygeal pain 03/25/2018  . Difficulty with speech 03/24/2018  . Poor balance 03/24/2018  . Hip pain 03/24/2018  . SAH (subarachnoid hemorrhage) ([place]) 03/06/2018  . Chest tightness 02/04/2018  . Left lower lobe pulmonary nodule 12/10/2017  . Paroxysmal atrial fibrillation ([place]) 10/30/2017  . Chronic sinusitis 03/14/2017  . Severe scoliosis 12/11/2016  . Prediabetes 06/06/2016  . Cough 06/06/2016  . Allergic rhinitis 02/08/2016  . Osteoporosis 12/05/2015  . Hyperlipidemia 05/27/2015  . Constipation 08/23/2011    Immunization History  Administered Date(s) Administered  . Fluad Quad(high Dose 65+) 12/14/2019  . Influenza, High Dose Seasonal PF 12/05/2015, 08/30/2017, 09/22/2018  . Influenza, Quadrivalent, Recombinant, Inj, Pf 10/29/2013, 10/04/2014  . Influenza-Unspecified 10/29/2013, 10/04/2014  . PFIZER(Purple Top)SARS-COV-2 Vaccination 01/24/2020, 02/23/2020, 09/03/2020  . Pneumococcal Conjugate-13 01/27/2014  . Pneumococcal Polysaccharide-23 12/26/2016  . Zoster Recombinat (Shingrix) 01/07/2019, 05/14/2019    Conditions to be addressed/monitored:  Hyperlipidemia, Atrial  Fibrillation, Coronary Artery Disease, Asthma and Allergic Rhinitis, Dizziness, tremor  Patient Care Plan: CCM Pharmacy Care Plan    Problem Identified: Atrial Fibrillation, CAD, HLD and Asthma/Allergies   Priority: High    Goal: Patient-Specific Goal   Start Date: 12/23/2020  Expected End Date: 06/22/2021  This Visit's Progress: On track  Priority: High  Note:    AFIB   Dx 08/2017 from event monitor, brief runs of PAF. Patient is currently rhythm controlled. BP goal is:  <130/80  Patient checks BP at home infrequently Patient home BP readings are ranging: n/a  Patient has failed these meds in past: Eliquis, amiodarone Patient is currently controlled on the following medications:  [name] Metoprolol tartrate 25 mg - 1/2 tab BID  We discussed:  Role of beta blocker in Afib; effect on BP and HR; encouraged pt to monitor at home for hypotension and bradycardia  Plan  Continue current medications  Hyperlipidemia / CAD   LDL goal < 100 Coronary CTA 11/2017: Ca score 125, mild nonobstructive CAD  Patient has failed these meds in past: atorvastatin, rosuvastatin  Patient is currently uncontrolled on the following medications:  . Pravastatin 40 mg daily HS . Ezetimibe 10 mg daily . Coenzyme Q10 100 mg daily  (fibromyalgia) . OTC fish oil 1000 mg daily  We discussed:  Pt saw lipid clinic in November and ezetimibe was added; she was told to get repeat lipid panel around January - she has not heard anything from cardiology office yet; per chart there is not a lipid panel ordered; will coordinate with cardiology office for repeat lipid panel   Plan  Continue current medications and  control with diet and exercise  Coordinate repeat lipid panel with cardiology office  Asthma / Allergies   Last spirometry: 05/13/20 - normal function Patient has failed these meds in past: n/a Patient is currently controlled on the following medications:  . Albuterol HFA prn . Breztri - 2 puffs  BID . Ipratropium 0.3% nasal spray PRN . Fluticasone nasal spray PRN (XHANCE) . Loratadine 10 mg daily . NaCL 0.65% nasal spray PRN . Olopatadine 0.2% eye drops BID prn  Using maintenance inhaler regularly? Yes Frequency of rescue inhaler use:  daily  We discussed:  proper inhaler technique; pt currently struggling with allergy symptoms due to weather  Plan  Continue current medications     Osteoporosis   Last DEXA Scan: 11/06/2018              T-Score femoral neck: -2.3             T-Score total hip: n/a             T-Score lumbar spine: n/a             T-Score forearm radius: -2.1             Prior hip/vertebral fracture             Advised Prolia previously - per GYN  Patient is a candidate for pharmacologic treatment due to history of hip fracture  and history of vertebral fracture  Patient has failed these meds in past: alendronate (2016) Patient is currently controlled on the following medications:   Ibandronate 150 mg q30 days  Calcium-Vitamin D 600-400 daily  Vitamin D 1000 IU daily  We discussed:  Recommend [phone] units of vitamin D daily. Recommend 1200 mg of calcium daily from dietary and supplemental sources. Counseled on oral bisphosphonate administration: take in the morning, 30 minutes prior to food with 6-8 oz of water. Do not lie down for at least 30 minutes after taking.; pt reports she plans to be on ibandronate until December 2021, she has f/u with Dr Matthew Saras then  Plan  Continue current medications  F/U with Dr Matthew Saras as scheduled  Myoclonus s/p TBI   Patient has failed these meds in past: depakote Patient is currently uncontrolled on the following medications:   Divalproex 125 mg BID  We discussed: Pt reports she stopped Keppra due to irritability and increased shaking. She was under the impression that there are not other medications to help with this issues; per neurology notes clonazepam was also being considered; advised pt to  see neurologist again to discuss next options  Plan Advised patient to f/u with neurologist   Medication Assistance: {MEDASSISTANCEINFO:25044}  Patient's preferred pharmacy is:  [facility] [address], [place] - [address] [place] [address] Phone: [phone] Fax: [phone]  Uses pill box? {Yes or If no, why not?:20788} Pt endorses ***% compliance  We discussed: {Pharmacy options:24294} Patient decided to: {US Pharmacy Plan:23885}  Care Plan and Follow Up Patient Decision:  {FOLLOWUP:24991}  Plan: {CM FOLLOW UP PLAN:25073}  ***

## 2021-03-22 NOTE — Progress Notes (Signed)
Follow-up Note  RE: Kara Ramirez MRN: [id number] DOB: Jun 23, 1951 Date of Office Visit: 03/22/2021   History of present illness: Kara Ramirez is a 70 y.o. female presenting today for follow-up of asthma and allergic rhinitis.  She was last seen in the office on 10/21/2020 by myself.  She presents today with her husband.  She continues to have dizziness often.  She is undergoing work-up for this.  She had a recent MRI of her brain and she is awaiting results of this that she will get later today.  She states she has been having trouble with her balance and walking.  She has had a cardiology evaluation and per patient she has a "great heart".  She is also in physical therapy to help with her gait.  She states she was asked by her primary care to see if any of her medications may be contributing to her dizziness. She is taking Breztri 2 puffs twice a day which helps with her asthma control.  She does not need to use her albuterol as much now with the use of this triple therapy inhaler medicine.  She has not had any ED or urgent care visits for her breathing since her last visit. With her allergy symptoms she has some congestion but it is much better control.  She does use the Xhance nasal spray device in the morning and she takes Claritin once a day.  She also has nasal ipratropium for as needed use for nasal drainage control.     Review of systems: Review of Systems  Constitutional: Negative.   HENT: Positive for congestion and nosebleeds.   Eyes: Negative.   Respiratory: Negative.   Cardiovascular: Negative.   Gastrointestinal: Negative.   Musculoskeletal: Negative.   Skin: Negative.   Neurological: Positive for dizziness.    All other systems negative unless noted above in HPI  Past medical/social/surgical/family history have been reviewed and are unchanged unless specifically indicated below.  No changes  Medication List: Current Outpatient Medications  Medication Sig  Dispense Refill  . acetaminophen (TYLENOL) 325 MG tablet Take 1-2 tablets (325-650 mg total) by mouth every 4 (four) hours as needed for mild pain. (Patient taking differently: Take 325 mg by mouth every 4 (four) hours as needed for mild pain or headache.)    . albuterol (VENTOLIN HFA) 108 (90 Base) MCG/ACT inhaler INHALE 1 TO 2 PUFF(S) BY MOUTH INTO THE LUNGS EVERY 6 HOURS AS NEEDED FOR WHEEZING OR SHORTNESS OF BREATH 8.5 g 1  . aspirin-acetaminophen-caffeine (EXCEDRIN MIGRAINE) 250-250-65 MG tablet Take 1 tablet by mouth every 6 (six) hours as needed for headache.    . Budeson-Glycopyrrol-Formoterol (BREZTRI AEROSPHERE) 160-9-4.8 MCG/ACT AERO Inhale 2 puffs into the lungs in the morning and at bedtime.    . Calcium Carbonate-Vitamin D 600-400 MG-UNIT tablet Take 1 tablet by mouth daily.    . Carboxymethylcellul-Glycerin 1-0.9 % GEL Place 1 application into both eyes at bedtime.    . cholecalciferol (VITAMIN D) 1000 UNITS tablet Take 1,000 Units by mouth daily.    [name] Co-Enzyme Q-10 100 MG CAPS Take 100 mg by mouth daily.     . famotidine (PEPCID) 20 MG tablet Take 1 tablet (20 mg total) by mouth daily. 90 tablet 3  . fluocinonide ointment (LIDEX) 9.48 % Apply 1 application topically See admin instructions. Apply topically twice daily for 5 days alternating with Tacrolimus ointment    . fluticasone (FLONASE) 50 MCG/ACT nasal spray USE 1 SPRAY IN EACH NOSTRIL  MID-DAY FOR CONGESTION (Patient taking differently: Place 1 spray into both nostrils daily.) 16 g 4  . ibandronate (BONIVA) 150 MG tablet Take 150 mg by mouth every 30 (thirty) days. Take in the morning with a full glass of water, on an empty stomach, and do not take anything else by mouth or lie down for the next 30 min.     [name] ipratropium (ATROVENT) 0.03 % nasal spray Place 2 sprays into both nostrils every 4 (four) hours as needed for rhinitis. 30 mL 5  . levocetirizine (XYZAL) 5 MG tablet Take 1 tablet (5 mg total) by mouth every evening. 30  tablet 5  . magnesium oxide (MAG-OX) 400 MG tablet Take 400 mg by mouth at bedtime.    [name] MALIC ACID PO Take 1 capsule by mouth at bedtime.    . methylcellulose (CITRUCEL) oral powder Take 1 packet by mouth daily.    . metoprolol tartrate (LOPRESSOR) 25 MG tablet TAKE 1/2 TABLET BY MOUTH TWICE A DAY 90 tablet 3  . mirabegron ER (MYRBETRIQ) 50 MG TB24 tablet Take 50 mg by mouth daily as needed.     . naproxen sodium (ALEVE) 220 MG tablet Take 220 mg by mouth daily as needed.     . Olopatadine HCl 0.2 % SOLN Apply to eye 2 (two) times daily as needed.    . Omega-3 Fatty Acids (FISH OIL) 1000 MG CAPS Take 1,000 mg by mouth at bedtime.     [name] Phenylephrine-Witch Hazel (HEMORRHOIDAL COOLING) 0.25-50 % GEL Apply topically.    . polyethylene glycol (MIRALAX / GLYCOLAX) 17 g packet Take 17 g by mouth daily.    . pravastatin (PRAVACHOL) 40 MG tablet Take 1 tablet (40 mg total) by mouth daily. 90 tablet 3  . senna (SENOKOT) 8.6 MG tablet Take 1 tablet by mouth daily.     . sodium chloride (OCEAN) 0.65 % SOLN nasal spray Place 1 spray into both nostrils as needed for congestion. (Patient taking differently: Place 1 spray into both nostrils 4 (four) times daily as needed for congestion.)  0  . tacrolimus (PROTOPIC) 0.1 % ointment Apply 1 application topically 2 (two) times daily as needed (PRN skin issues). (Patient taking differently: Apply 1 application topically See admin instructions. Apply topically twice daily for 5 days alternating with Fluocinonide ointment) 100 g 0  . Thiamine HCl (VITAMIN B-1) 100 MG tablet Take 100 mg by mouth daily.    [name] triamcinolone ointment (KENALOG) 0.5 % Apply 1 application topically 2 (two) times daily. For rash on left lower leg 30 g 0  . divalproex (DEPAKOTE) 125 MG DR tablet Take 1 tablet (125 mg total) by mouth 2 (two) times daily. (Patient not taking: Reported on 03/22/2021) 180 tablet 1  . ezetimibe (ZETIA) 10 MG tablet Take 1 tablet (10 mg total) by mouth daily. 90 tablet  3   No current facility-administered medications for this visit.     Known medication allergies: Allergies  Allergen Reactions  . Mold Extract [Trichophyton Mentagrophyte] Shortness Of Breath and Rash  . Penicillins Shortness Of Breath, Rash and Other (See Comments)    Eyes puffy Has taken low dose pcn and no rx REACTION: rash, SOB Has patient had a PCN reaction causing immediate rash, facial/tongue/throat swelling, SOB or lightheadedness with hypotension: yes Has patient had a PCN reaction causing severe rash involving mucus membranes or skin necrosis: unk Has patient had a PCN reaction that required hospitalization: no Has patient had a PCN reaction occurring within  the last 10 years: unk If all of the above answers are "NO", then may proceed with Cephalospor  . Morphine Other (See Comments)    REACTION: tachycardia and anxiety  . Peanut Oil Nausea And Vomiting    Peanut butter  . Protonix [Pantoprazole Sodium] Nausea And Vomiting  . Atorvastatin     MYAGLIAS  . Citrus Other (See Comments)    Unknown  . Peanut-Containing Drug Products Other (See Comments)    Unknown  . Rosuvastatin     MYALGIAS  . Tramadol Other (See Comments)    Makes crazy;confused  . Valium [Diazepam] Other (See Comments)    Confusion per family  . Cetirizine Rash and Other (See Comments)    Around face  . Codeine Other (See Comments)    REACTION: dizzy and "groggy in my head"  . Eggs Or Egg-Derived Products Nausea And Vomiting  . Latex Itching  . Pentazocine Lactate Palpitations     Physical examination: Blood pressure 100/62, pulse 76, temperature (!) 97.3 F (36.3 C), temperature source Temporal, resp. rate 16, SpO2 94 %.  General: Alert, interactive, in no acute distress. HEENT: PERRLA, TMs pearly gray, turbinates minimally edematous without discharge, post-pharynx non erythematous. Neck: Supple without lymphadenopathy. Lungs: Clear to auscultation without wheezing, rhonchi or rales. {no  increased work of breathing. CV: Normal S1, S2 without murmurs. Abdomen: Nondistended, nontender. Skin: Warm and dry, without lesions or rashes. Extremities:  No clubbing, cyanosis or edema. Neuro:   Grossly intact.  Diagnositics/Labs:  Spirometry: FEV1: 1.77 L 75%, FVC: 2.53 L 81%, ratio consistent with Nonobstructive pattern.  This is normal for age  MRI Brain without contrast 03/13/2021-impression no evidence of recent infarction.  No intracranial mass.  Evidence of prior head trauma.  Generalized parenchymal volume loss similar to prior.  Paranasal sinuses are noted to be aerated  Assessment and plan:  Asthma: . Daily controller medication(s):  Breztri 146mcg 2 puffs twice a day with spacer and rinse mouth afterwards.  Judithann Sauger is a triple therapy inhaler that is a step-up from Symbicort for better symptom control . May use albuterol rescue inhaler 2 puffs every 4 to 6 hours as needed for shortness of breath, chest tightness, coughing, and wheezing. May use albuterol rescue inhaler 2 puffs 5 to 15 minutes prior to strenuous physical activities. Monitor frequency of use.  . Asthma control goals:  o Full participation in all desired activities (may need albuterol before activity) o Albuterol use two times or less a week on average (not counting use with activity) o Cough interfering with sleep two times or less a month o Oral steroids no more than once a year o No hospitalizations  Allergic rhinitis/chronic rhinosinusitis: I do not believe any of her allergy based medications are contributing to her dizziness however if she does have a degree of congestion this could contribute to her dizziness and thus we will have her to maximize her Truett Perna use which is helpful in controlling congestion. Her MRI brain does not show any evidence of sinus disease.  Use Xhance 2 sprays per nostril twice a day to help with sinus congestion control (this can help improve dizziness if related to sinus  congestion)  Use nasal ipratropium one spray per nostril twice a day and prior to eating.  Use know more than 4 times a day.   May use nasal saline spray to help keep the nose from getting too dry  Hold Claritin for next week and see if you note any improvement in  dizziness while off.  Replace with Xyzal 5mg  daily.    Continue environmental control measures.  Follow up in 3-4 months or sooner if needed  . I appreciate the opportunity to take part in Darice's care. Please do not hesitate to contact me with questions.  Sincerely,   Prudy Feeler, MD Allergy/Immunology [facility]

## 2021-03-23 ENCOUNTER — Encounter (HOSPITAL_BASED_OUTPATIENT_CLINIC_OR_DEPARTMENT_OTHER): Payer: Self-pay | Admitting: Physical Therapy

## 2021-03-23 ENCOUNTER — Ambulatory Visit (HOSPITAL_BASED_OUTPATIENT_CLINIC_OR_DEPARTMENT_OTHER): Payer: PPO | Attending: Internal Medicine | Admitting: Physical Therapy

## 2021-03-23 DIAGNOSIS — R293 Abnormal posture: Secondary | ICD-10-CM | POA: Diagnosis not present

## 2021-03-23 DIAGNOSIS — R4701 Aphasia: Secondary | ICD-10-CM | POA: Insufficient documentation

## 2021-03-23 DIAGNOSIS — M6281 Muscle weakness (generalized): Secondary | ICD-10-CM

## 2021-03-23 DIAGNOSIS — R41841 Cognitive communication deficit: Secondary | ICD-10-CM | POA: Insufficient documentation

## 2021-03-23 DIAGNOSIS — R279 Unspecified lack of coordination: Secondary | ICD-10-CM
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
## 2021-03-28 ENCOUNTER — Ambulatory Visit (HOSPITAL_BASED_OUTPATIENT_CLINIC_OR_DEPARTMENT_OTHER): Payer: PPO | Admitting: Physical Therapy

## 2021-03-28 ENCOUNTER — Other Ambulatory Visit: Payer: Self-pay

## 2021-03-28 ENCOUNTER — Encounter (HOSPITAL_BASED_OUTPATIENT_CLINIC_OR_DEPARTMENT_OTHER): Payer: Self-pay | Admitting: Physical Therapy

## 2021-03-28 DIAGNOSIS — M6281 Muscle weakness (generalized): Secondary | ICD-10-CM | POA: Diagnosis not present

## 2021-03-28 DIAGNOSIS — R293 Abnormal posture: Secondary | ICD-10-CM

## 2021-03-28 DIAGNOSIS — R279 Unspecified lack of coordination: Secondary | ICD-10-CM
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
## 2021-04-03 ENCOUNTER — Ambulatory Visit (HOSPITAL_BASED_OUTPATIENT_CLINIC_OR_DEPARTMENT_OTHER): Payer: PPO | Admitting: Physical Therapy

## 2021-04-06 DIAGNOSIS — L603 Nail dystrophy: Secondary | ICD-10-CM | POA: Diagnosis not present

## 2021-04-06 DIAGNOSIS — L821 Other seborrheic keratosis: Secondary | ICD-10-CM | POA: Diagnosis not present

## 2021-04-06 DIAGNOSIS — L570 Actinic keratosis: Secondary | ICD-10-CM | POA: Diagnosis not present

## 2021-04-06 DIAGNOSIS — L900 Lichen sclerosus et atrophicus: Secondary | ICD-10-CM | POA: Diagnosis not present

## 2021-04-06 DIAGNOSIS — D1801 Hemangioma of skin and subcutaneous tissue: Secondary | ICD-10-CM | POA: Diagnosis not present

## 2021-04-07 ENCOUNTER — Ambulatory Visit (HOSPITAL_BASED_OUTPATIENT_CLINIC_OR_DEPARTMENT_OTHER): Payer: PPO | Admitting: Physical Therapy

## 2021-04-07 ENCOUNTER — Other Ambulatory Visit: Payer: Self-pay

## 2021-04-07 ENCOUNTER — Encounter (HOSPITAL_BASED_OUTPATIENT_CLINIC_OR_DEPARTMENT_OTHER): Payer: Self-pay | Admitting: Physical Therapy

## 2021-04-07 DIAGNOSIS — M6281 Muscle weakness (generalized): Secondary | ICD-10-CM

## 2021-04-07 DIAGNOSIS — R293 Abnormal posture: Secondary | ICD-10-CM

## 2021-04-07 DIAGNOSIS — R279 Unspecified lack of coordination: Secondary | ICD-10-CM
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
## 2021-04-10 ENCOUNTER — Ambulatory Visit (HOSPITAL_BASED_OUTPATIENT_CLINIC_OR_DEPARTMENT_OTHER): Payer: PPO | Admitting: Physical Therapy

## 2021-04-10 ENCOUNTER — Other Ambulatory Visit: Payer: Self-pay

## 2021-04-10 ENCOUNTER — Encounter (HOSPITAL_BASED_OUTPATIENT_CLINIC_OR_DEPARTMENT_OTHER): Payer: Self-pay | Admitting: Physical Therapy

## 2021-04-10 DIAGNOSIS — R293 Abnormal posture: Secondary | ICD-10-CM

## 2021-04-10 DIAGNOSIS — R279 Unspecified lack of coordination: Secondary | ICD-10-CM

## 2021-04-10 DIAGNOSIS — R4701 Aphasia: Secondary | ICD-10-CM

## 2021-04-10 DIAGNOSIS — M6281 Muscle weakness (generalized): Secondary | ICD-10-CM

## 2021-04-10 DIAGNOSIS — R41841 Cognitive communication deficit: Secondary | ICD-10-CM
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
## 2021-04-14 ENCOUNTER — Other Ambulatory Visit: Payer: Self-pay

## 2021-04-14 ENCOUNTER — Encounter (HOSPITAL_BASED_OUTPATIENT_CLINIC_OR_DEPARTMENT_OTHER): Payer: Self-pay | Admitting: Physical Therapy

## 2021-04-14 ENCOUNTER — Ambulatory Visit (HOSPITAL_BASED_OUTPATIENT_CLINIC_OR_DEPARTMENT_OTHER): Payer: PPO | Admitting: Physical Therapy

## 2021-04-14 DIAGNOSIS — R41841 Cognitive communication deficit: Secondary | ICD-10-CM

## 2021-04-14 DIAGNOSIS — M6281 Muscle weakness (generalized): Secondary | ICD-10-CM | POA: Diagnosis not present

## 2021-04-14 DIAGNOSIS — R4701 Aphasia: Secondary | ICD-10-CM

## 2021-04-14 DIAGNOSIS — R293 Abnormal posture: Secondary | ICD-10-CM

## 2021-04-14 DIAGNOSIS — R279 Unspecified lack of coordination: Secondary | ICD-10-CM
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
## 2021-04-18 DIAGNOSIS — R3915 Urgency of urination: Secondary | ICD-10-CM | POA: Diagnosis not present

## 2021-04-18 DIAGNOSIS — N3281 Overactive bladder: Secondary | ICD-10-CM | POA: Diagnosis not present

## 2021-04-21 ENCOUNTER — Ambulatory Visit (HOSPITAL_BASED_OUTPATIENT_CLINIC_OR_DEPARTMENT_OTHER): Payer: PPO | Attending: Internal Medicine | Admitting: Physical Therapy

## 2021-04-21 ENCOUNTER — Other Ambulatory Visit: Payer: Self-pay

## 2021-04-21 ENCOUNTER — Encounter (HOSPITAL_BASED_OUTPATIENT_CLINIC_OR_DEPARTMENT_OTHER): Payer: Self-pay | Admitting: Physical Therapy

## 2021-04-21 DIAGNOSIS — R293 Abnormal posture: Secondary | ICD-10-CM

## 2021-04-21 DIAGNOSIS — R4701 Aphasia: Secondary | ICD-10-CM | POA: Insufficient documentation

## 2021-04-21 DIAGNOSIS — R41841 Cognitive communication deficit: Secondary | ICD-10-CM | POA: Diagnosis not present

## 2021-04-21 DIAGNOSIS — M6281 Muscle weakness (generalized): Secondary | ICD-10-CM | POA: Insufficient documentation

## 2021-04-21 DIAGNOSIS — R279 Unspecified lack of coordination: Secondary | ICD-10-CM | POA: Diagnosis not present
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
## 2021-04-24 ENCOUNTER — Other Ambulatory Visit: Payer: Self-pay

## 2021-04-24 ENCOUNTER — Ambulatory Visit (HOSPITAL_BASED_OUTPATIENT_CLINIC_OR_DEPARTMENT_OTHER): Payer: PPO | Admitting: Physical Therapy

## 2021-04-24 ENCOUNTER — Encounter (HOSPITAL_BASED_OUTPATIENT_CLINIC_OR_DEPARTMENT_OTHER): Payer: Self-pay | Admitting: Physical Therapy

## 2021-04-24 DIAGNOSIS — M6281 Muscle weakness (generalized): Secondary | ICD-10-CM

## 2021-04-24 DIAGNOSIS — R41841 Cognitive communication deficit: Secondary | ICD-10-CM

## 2021-04-24 DIAGNOSIS — R293 Abnormal posture: Secondary | ICD-10-CM

## 2021-04-24 DIAGNOSIS — R4701 Aphasia: Secondary | ICD-10-CM

## 2021-04-24 DIAGNOSIS — R279 Unspecified lack of coordination: Secondary | ICD-10-CM
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
## 2021-04-26 ENCOUNTER — Ambulatory Visit (HOSPITAL_BASED_OUTPATIENT_CLINIC_OR_DEPARTMENT_OTHER): Payer: PPO | Admitting: Physical Therapy

## 2021-04-28 ENCOUNTER — Ambulatory Visit (HOSPITAL_BASED_OUTPATIENT_CLINIC_OR_DEPARTMENT_OTHER): Payer: PPO | Admitting: Physical Therapy

## 2021-04-28 ENCOUNTER — Other Ambulatory Visit: Payer: Self-pay

## 2021-04-28 ENCOUNTER — Encounter (HOSPITAL_BASED_OUTPATIENT_CLINIC_OR_DEPARTMENT_OTHER): Payer: Self-pay | Admitting: Physical Therapy

## 2021-04-28 DIAGNOSIS — M6281 Muscle weakness (generalized): Secondary | ICD-10-CM | POA: Diagnosis not present

## 2021-04-28 DIAGNOSIS — R293 Abnormal posture: Secondary | ICD-10-CM

## 2021-04-28 DIAGNOSIS — R279 Unspecified lack of coordination: Secondary | ICD-10-CM

## 2021-04-30 ENCOUNTER — Encounter (HOSPITAL_BASED_OUTPATIENT_CLINIC_OR_DEPARTMENT_OTHER): Payer: Self-pay | Admitting: Physical Therapy
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
## 2021-05-03 ENCOUNTER — Other Ambulatory Visit: Payer: Self-pay

## 2021-05-03 ENCOUNTER — Ambulatory Visit (HOSPITAL_BASED_OUTPATIENT_CLINIC_OR_DEPARTMENT_OTHER): Payer: PPO | Admitting: Physical Therapy

## 2021-05-03 DIAGNOSIS — R279 Unspecified lack of coordination: Secondary | ICD-10-CM

## 2021-05-03 DIAGNOSIS — M6281 Muscle weakness (generalized): Secondary | ICD-10-CM | POA: Diagnosis not present

## 2021-05-03 DIAGNOSIS — R293 Abnormal posture: Secondary | ICD-10-CM

## 2021-05-04 ENCOUNTER — Encounter (HOSPITAL_BASED_OUTPATIENT_CLINIC_OR_DEPARTMENT_OTHER): Payer: Self-pay | Admitting: Physical Therapy
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
## 2021-05-10 ENCOUNTER — Ambulatory Visit (HOSPITAL_BASED_OUTPATIENT_CLINIC_OR_DEPARTMENT_OTHER): Payer: PPO | Admitting: Physical Therapy

## 2021-05-10 ENCOUNTER — Other Ambulatory Visit: Payer: Self-pay

## 2021-05-10 ENCOUNTER — Encounter (HOSPITAL_BASED_OUTPATIENT_CLINIC_OR_DEPARTMENT_OTHER): Payer: Self-pay | Admitting: Physical Therapy

## 2021-05-10 DIAGNOSIS — M6281 Muscle weakness (generalized): Secondary | ICD-10-CM

## 2021-05-10 DIAGNOSIS — R293 Abnormal posture: Secondary | ICD-10-CM

## 2021-05-11 ENCOUNTER — Encounter (HOSPITAL_BASED_OUTPATIENT_CLINIC_OR_DEPARTMENT_OTHER): Payer: Self-pay | Admitting: Physical Therapy
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
## 2021-05-15 ENCOUNTER — Ambulatory Visit (HOSPITAL_BASED_OUTPATIENT_CLINIC_OR_DEPARTMENT_OTHER): Payer: PPO | Admitting: Physical Therapy

## 2021-05-15 ENCOUNTER — Encounter (HOSPITAL_BASED_OUTPATIENT_CLINIC_OR_DEPARTMENT_OTHER): Payer: Self-pay | Admitting: Physical Therapy

## 2021-05-15 ENCOUNTER — Other Ambulatory Visit: Payer: Self-pay

## 2021-05-15 DIAGNOSIS — M6281 Muscle weakness (generalized): Secondary | ICD-10-CM

## 2021-05-15 DIAGNOSIS — R293 Abnormal posture: Secondary | ICD-10-CM

## 2021-05-15 DIAGNOSIS — R41841 Cognitive communication deficit: Secondary | ICD-10-CM

## 2021-05-15 DIAGNOSIS — R279 Unspecified lack of coordination: Secondary | ICD-10-CM

## 2021-05-15 DIAGNOSIS — R4701 Aphasia: Secondary | ICD-10-CM

## 2021-05-15 NOTE — Progress Notes (Signed)
Assessment/Plan:   1.  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  Higher dosages caused irritability.  She felt the same thing with Keppra. 2.  Gait ataxia, progressive  -Going to go ahead and order SCA evaluation given diplopia (has prisms in the glasses) just to make sure nothing else is going on.  -DaTscan negative  -MRI cervical spine essential unremarkable  -MRI brain nonacute (old TBI in the R superior frontal gyrus)  -if above will consider EMG and maybe NM consult  3.  Pt to call me once labs drawn   Subjective:   RAWAN RIENDEAU was seen today in follow up for myoclonus, likely due to prior traumatic brain injury.  My previous records as well as any outside records available were reviewed prior to todays visit.  Pt with husband who supplements hx.  She saw Dr. Trenton Gammon May for, also for ataxia.  He reported that she was declining "neuro cognitively."  I do not have his plan/recommendations from that visit but I can see she has a follow-up in November.  She did attend physical therapy.  She had a DaTscan since our last visit.  I personally reviewed that.  It was normal.  She had an MRI brain with neurosurgery that demonstrated evidence of encephalomalacia in the right superior frontal gyrus, due to prior traumatic brain injury.  There was evidence of chronic subarachnoid blood products.  Ventricles were stable in size.  We did restart her Depakote last visit, but at half the dose.  Previously, she had thought that it helped movements but made her irritable and upset.  Reports today that movements may have been better with the smaller dose but it wasn't renewed and she went off of it.  She feels more irritable now.  Her balance seems to be getting worse esp if has to turn or gets up fast.  Diplopia is okay with prisms in the glasses but present without  the "special glasses."  Feels stuttery when speaking.  No swallow trouble but coughs frequently and thinks d/t allergies.   PREVIOUS MEDICATIONS: VPA (helped myoclonus but pt thought caused mood issues); keppra (also with mood issues)  CURRENT MEDICATIONS:  Outpatient Encounter Medications as of 05/16/2021  Medication Sig   acetaminophen (TYLENOL) 325 MG tablet Take 1-2 tablets (325-650 mg total) by mouth every 4 (four) hours as needed for mild pain. (Patient taking differently: Take 325 mg by mouth every 4 (four) hours as needed for mild pain or headache.)   albuterol (VENTOLIN HFA) 108 (90 Base) MCG/ACT inhaler INHALE 1 TO 2 PUFF(S) BY MOUTH INTO THE LUNGS EVERY 6 HOURS AS NEEDED FOR WHEEZING OR SHORTNESS OF BREATH   aspirin-acetaminophen-caffeine (EXCEDRIN MIGRAINE) 250-250-65 MG tablet Take 1 tablet by mouth every 6 (six) hours as needed for headache.   Budeson-Glycopyrrol-Formoterol (BREZTRI AEROSPHERE) 160-9-4.8 MCG/ACT AERO Inhale 2 puffs into the lungs in the morning and at bedtime.   Calcium Carbonate-Vitamin D 600-400 MG-UNIT tablet Take 1 tablet by mouth daily.   Carboxymethylcellul-Glycerin 1-0.9 % GEL Place 1 application into both eyes at bedtime.   cholecalciferol (VITAMIN D) 1000 UNITS tablet Take 1,000 Units by mouth daily.   Co-Enzyme Q-10 100 MG CAPS Take 100 mg by mouth daily.    divalproex (DEPAKOTE) 125 MG DR tablet Take 1 tablet (125 mg total) by mouth 2 (two)  times daily.   ezetimibe (ZETIA) 10 MG tablet Take 1 tablet (10 mg total) by mouth daily.   famotidine (PEPCID) 20 MG tablet Take 1 tablet (20 mg total) by mouth daily.   fluocinonide ointment (LIDEX) 8.33 % Apply 1 application topically See admin instructions. Apply topically twice daily for 5 days alternating with Tacrolimus ointment   fluticasone (FLONASE) 50 MCG/ACT nasal spray USE 1 SPRAY IN EACH NOSTRIL MID-DAY FOR CONGESTION (Patient taking differently: Place 1 spray into both nostrils daily.)   ibandronate  (BONIVA) 150 MG tablet Take 150 mg by mouth every 30 (thirty) days. Take in the morning with a full glass of water, on an empty stomach, and do not take anything else by mouth or lie down for the next 30 min.    ipratropium (ATROVENT) 0.03 % nasal spray Place 2 sprays into both nostrils every 4 (four) hours as needed for rhinitis.   levocetirizine (XYZAL) 5 MG tablet Take 1 tablet (5 mg total) by mouth every evening.   magnesium oxide (MAG-OX) 400 MG tablet Take 400 mg by mouth at bedtime.   MALIC ACID PO Take 1 capsule by mouth at bedtime.   methylcellulose (CITRUCEL) oral powder Take 1 packet by mouth daily.   metoprolol tartrate (LOPRESSOR) 25 MG tablet TAKE 1/2 TABLET BY MOUTH TWICE A DAY   mirabegron ER (MYRBETRIQ) 50 MG TB24 tablet Take 50 mg by mouth daily as needed.    naproxen sodium (ALEVE) 220 MG tablet Take 220 mg by mouth daily as needed.    Olopatadine HCl 0.2 % SOLN Apply to eye 2 (two) times daily as needed.   Omega-3 Fatty Acids (FISH OIL) 1000 MG CAPS Take 1,000 mg by mouth at bedtime.    Phenylephrine-Witch Hazel (HEMORRHOIDAL COOLING) 0.25-50 % GEL Apply topically.   polyethylene glycol (MIRALAX / GLYCOLAX) 17 g packet Take 17 g by mouth daily.   pravastatin (PRAVACHOL) 40 MG tablet Take 1 tablet (40 mg total) by mouth daily.   senna (SENOKOT) 8.6 MG tablet Take 1 tablet by mouth daily.    sodium chloride (OCEAN) 0.65 % SOLN nasal spray Place 1 spray into both nostrils as needed for congestion. (Patient taking differently: Place 1 spray into both nostrils 4 (four) times daily as needed for congestion.)   tacrolimus (PROTOPIC) 0.1 % ointment Apply 1 application topically 2 (two) times daily as needed (PRN skin issues). (Patient taking differently: Apply 1 application topically See admin instructions. Apply topically twice daily for 5 days alternating with Fluocinonide ointment)   Thiamine HCl (VITAMIN B-1) 100 MG tablet Take 100 mg by mouth daily.   triamcinolone ointment  (KENALOG) 0.5 % Apply 1 application topically 2 (two) times daily. For rash on left lower leg   No facility-administered encounter medications on file as of 05/16/2021.     Objective:   PHYSICAL EXAMINATION:    VITALS:   Vitals:   05/16/21 1112  BP: 104/72  Pulse: 75  SpO2: 98%  Weight: 159 lb 6.4 oz (72.3 kg)  Height: 5\' 6"  (1.676 m)     GEN:  The patient appears stated age and is in NAD. HEENT:  Normocephalic, atraumatic.  The mucous membranes are moist. The superficial temporal arteries are without ropiness or tenderness. CV:  RRR Lungs:  CTAB Neck/HEME:  There are no carotid bruits bilaterally.  Neurological examination:  Orientation: The patient is alert and oriented x3. Cranial nerves: There is good facial symmetry.The speech is fluent and clear. Soft palate rises symmetrically and  there is no tongue deviation. Hearing is intact to conversational tone. Sensation: Sensation is intact to light touch throughout Motor: Strength is decreased grasp on the L.  No m fasciculations noted.    Movement examination: Tone: There is normal tone in the UE/LE Abnormal movements:  Irregular tremulous motion in the L hand with activation.  No rest tremor Coordination:  There is slow with RAMs on the L but no decremation.  She talks with her hands but then is very slow when doing RAMs Gait and Station: The patient is slow and tenuous with ambulation.  Requires assist OOC (mild).  She is wide based.  Ambulates with cane.  She is very unsteady and the L leg drags  Total time spent on today's visit was 37 minutes, including both face-to-face time and nonface-to-face time.  Time included that spent on review of records (prior notes available to me/labs/imaging if pertinent), discussing treatment and goals, answering patient's questions and coordinating care.  Cc:  Binnie Rail, MD

## 2021-05-16 ENCOUNTER — Ambulatory Visit: Payer: PPO | Admitting: Neurology

## 2021-05-16 ENCOUNTER — Encounter: Payer: Self-pay | Admitting: Neurology

## 2021-05-16 VITALS — BP 104/72 | HR 75 | Ht 66.0 in | Wt 159.4 lb

## 2021-05-16 DIAGNOSIS — R270 Ataxia, unspecified: Secondary | ICD-10-CM
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
## 2021-05-16 NOTE — Patient Instructions (Signed)
We will send our request to [facility] for lab.  They should contact you.  If you don't hear from them in 2 weeks call me.  Also, contact me when you do hear from them.

## 2021-05-18 ENCOUNTER — Encounter (HOSPITAL_BASED_OUTPATIENT_CLINIC_OR_DEPARTMENT_OTHER): Payer: PPO | Admitting: Physical Therapy

## 2021-05-29 ENCOUNTER — Telehealth: Payer: Self-pay | Admitting: Neurology

## 2021-05-29 NOTE — Telephone Encounter (Signed)
Pt called in and would like a call back regarding results of her [facility]. Michela Pitcher its been more than 2 wks. [phone]

## 2021-05-30 NOTE — Telephone Encounter (Signed)
Needs to hear from [place]. Needs number, no labs done yet.

## 2021-05-30 NOTE — Telephone Encounter (Signed)
Pt seems to be confused some. She said she is shaking so bad, she cant hardly do anything. It is also affected her speech. She said she has not labs done yet, cause she has not recvd paperwork. I let her know to keep her phone nearby, that I will let mahina know.

## 2021-05-30 NOTE — Telephone Encounter (Signed)
Called patient and left a message for a call back.  Need to inform patient that labs are not in yet (I don't see it scanned into [employer] or on her lab results) and Dr. Carles Collet is out on vacation.

## 2021-05-30 NOTE — Telephone Encounter (Signed)
[phone].patient advised.

## 2021-05-31 ENCOUNTER — Other Ambulatory Visit: Payer: Self-pay

## 2021-05-31 ENCOUNTER — Ambulatory Visit (HOSPITAL_BASED_OUTPATIENT_CLINIC_OR_DEPARTMENT_OTHER): Payer: PPO | Attending: Internal Medicine | Admitting: Physical Therapy

## 2021-05-31 ENCOUNTER — Encounter (HOSPITAL_BASED_OUTPATIENT_CLINIC_OR_DEPARTMENT_OTHER): Payer: Self-pay | Admitting: Physical Therapy

## 2021-05-31 DIAGNOSIS — R293 Abnormal posture: Secondary | ICD-10-CM | POA: Insufficient documentation

## 2021-05-31 DIAGNOSIS — R41841 Cognitive communication deficit: Secondary | ICD-10-CM | POA: Insufficient documentation

## 2021-05-31 DIAGNOSIS — M6281 Muscle weakness (generalized): Secondary | ICD-10-CM | POA: Diagnosis not present

## 2021-05-31 DIAGNOSIS — R4701 Aphasia: Secondary | ICD-10-CM

## 2021-05-31 DIAGNOSIS — R279 Unspecified lack of coordination: Secondary | ICD-10-CM | POA: Insufficient documentation
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
## 2021-06-01 ENCOUNTER — Other Ambulatory Visit: Payer: Self-pay | Admitting: Gastroenterology

## 2021-06-07 ENCOUNTER — Encounter (HOSPITAL_BASED_OUTPATIENT_CLINIC_OR_DEPARTMENT_OTHER): Payer: Self-pay | Admitting: Physical Therapy

## 2021-06-07 ENCOUNTER — Other Ambulatory Visit: Payer: Self-pay

## 2021-06-07 ENCOUNTER — Ambulatory Visit (HOSPITAL_BASED_OUTPATIENT_CLINIC_OR_DEPARTMENT_OTHER): Payer: PPO | Admitting: Physical Therapy

## 2021-06-07 DIAGNOSIS — R41841 Cognitive communication deficit: Secondary | ICD-10-CM

## 2021-06-07 DIAGNOSIS — M6281 Muscle weakness (generalized): Secondary | ICD-10-CM

## 2021-06-07 DIAGNOSIS — R4701 Aphasia: Secondary | ICD-10-CM

## 2021-06-07 DIAGNOSIS — R293 Abnormal posture: Secondary | ICD-10-CM

## 2021-06-07 DIAGNOSIS — R279 Unspecified lack of coordination: Secondary | ICD-10-CM
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
## 2021-06-08 DIAGNOSIS — R270 Ataxia, unspecified: Secondary | ICD-10-CM | POA: Diagnosis not present

## 2021-06-12 ENCOUNTER — Other Ambulatory Visit: Payer: Self-pay

## 2021-06-12 ENCOUNTER — Ambulatory Visit (HOSPITAL_BASED_OUTPATIENT_CLINIC_OR_DEPARTMENT_OTHER): Payer: PPO | Admitting: Physical Therapy

## 2021-06-12 ENCOUNTER — Encounter (HOSPITAL_BASED_OUTPATIENT_CLINIC_OR_DEPARTMENT_OTHER): Payer: Self-pay | Admitting: Physical Therapy

## 2021-06-12 DIAGNOSIS — M6281 Muscle weakness (generalized): Secondary | ICD-10-CM | POA: Diagnosis not present

## 2021-06-12 DIAGNOSIS — R293 Abnormal posture: Secondary | ICD-10-CM
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
## 2021-06-13 NOTE — Patient Instructions (Addendum)
Blood work was ordered.     Medications changes include :   none     Please followup in 1 year    Health Maintenance, Female Adopting a healthy lifestyle and getting preventive care are important in promoting health and wellness. Ask your health care provider about: The right schedule for you to have regular tests and exams. Things you can do on your own to prevent diseases and keep yourself healthy. What should I know about diet, weight, and exercise? Eat a healthy diet  Eat a diet that includes plenty of vegetables, fruits, low-fat dairy products, and lean protein. Do not eat a lot of foods that are high in solid fats, added sugars, or sodium.  Maintain a healthy weight Body mass index (BMI) is used to identify weight problems. It estimates body fat based on height and weight. Your health care provider can help determineyour BMI and help you achieve or maintain a healthy weight. Get regular exercise Get regular exercise. This is one of the most important things you can do for your health. Most adults should: Exercise for at least 150 minutes each week. The exercise should increase your heart rate and make you sweat (moderate-intensity exercise). Do strengthening exercises at least twice a week. This is in addition to the moderate-intensity exercise. Spend less time sitting. Even light physical activity can be beneficial. Watch cholesterol and blood lipids Have your blood tested for lipids and cholesterol at 71 years of age, then havethis test every 5 years. Have your cholesterol levels checked more often if: Your lipid or cholesterol levels are high. You are older than 71 years of age. You are at high risk for heart disease. What should I know about cancer screening? Depending on your health history and family history, you may need to have cancer screening at various ages. This may include screening for: Breast cancer. Cervical cancer. Colorectal cancer. Skin cancer. Lung  cancer. What should I know about heart disease, diabetes, and high blood pressure? Blood pressure and heart disease High blood pressure causes heart disease and increases the risk of stroke. This is more likely to develop in people who have high blood pressure readings, are of African descent, or are overweight. Have your blood pressure checked: Every 3-5 years if you are 18-39 years of age. Every year if you are 40 years old or older. Diabetes Have regular diabetes screenings. This checks your fasting blood sugar level. Have the screening done: Once every three years after age 40 if you are at a normal weight and have a low risk for diabetes. More often and at a younger age if you are overweight or have a high risk for diabetes. What should I know about preventing infection? Hepatitis B If you have a higher risk for hepatitis B, you should be screened for this virus. Talk with your health care provider to find out if you are at risk forhepatitis B infection. Hepatitis C Testing is recommended for: Everyone born from 1945 through 1965. Anyone with known risk factors for hepatitis C. Sexually transmitted infections (STIs) Get screened for STIs, including gonorrhea and chlamydia, if: You are sexually active and are younger than 71 years of age. You are older than 71 years of age and your health care provider tells you that you are at risk for this type of infection. Your sexual activity has changed since you were last screened, and you are at increased risk for chlamydia or gonorrhea. Ask your health care provider if you are   at risk. Ask your health care provider about whether you are at high risk for HIV. Your health care provider may recommend a prescription medicine to help prevent HIV infection. If you choose to take medicine to prevent HIV, you should first get tested for HIV. You should then be tested every 3 months for as long as you are taking the medicine. Pregnancy If you are about  to stop having your period (premenopausal) and you may become pregnant, seek counseling before you get pregnant. Take 400 to 800 micrograms (mcg) of folic acid every day if you become pregnant. Ask for birth control (contraception) if you want to prevent pregnancy. Osteoporosis and menopause Osteoporosis is a disease in which the bones lose minerals and strength with aging. This can result in bone fractures. If you are 65 years old or older, or if you are at risk for osteoporosis and fractures, ask your health care provider if you should: Be screened for bone loss. Take a calcium or vitamin D supplement to lower your risk of fractures. Be given hormone replacement therapy (HRT) to treat symptoms of menopause. Follow these instructions at home: Lifestyle Do not use any products that contain nicotine or tobacco, such as cigarettes, e-cigarettes, and chewing tobacco. If you need help quitting, ask your health care provider. Do not use street drugs. Do not share needles. Ask your health care provider for help if you need support or information about quitting drugs. Alcohol use Do not drink alcohol if: Your health care provider tells you not to drink. You are pregnant, may be pregnant, or are planning to become pregnant. If you drink alcohol: Limit how much you use to 0-1 drink a day. Limit intake if you are breastfeeding. Be aware of how much alcohol is in your drink. In the [place] one drink equals one 12 oz bottle of beer (355 mL), one 5 oz glass of wine (148 mL), or one 1 oz glass of hard liquor (44 mL). General instructions Schedule regular health, dental, and eye exams. Stay current with your vaccines. Tell your health care provider if: You often feel depressed. You have ever been abused or do not feel safe at home. Summary Adopting a healthy lifestyle and getting preventive care are important in promoting health and wellness. Follow your health care provider's instructions about  healthy diet, exercising, and getting tested or screened for diseases. Follow your health care provider's instructions on monitoring your cholesterol and blood pressure. This information is not intended to replace advice given to you by your health care provider. Make sure you discuss any questions you have with your healthcare provider. Document Revised: 10/29/2018 Document Reviewed: 10/29/2018 Elsevier Patient Education  2022 Elsevier Inc.  

## 2021-06-14 ENCOUNTER — Encounter: Payer: Self-pay | Admitting: Internal Medicine

## 2021-06-14 ENCOUNTER — Ambulatory Visit (INDEPENDENT_AMBULATORY_CARE_PROVIDER_SITE_OTHER): Payer: PPO | Admitting: Internal Medicine

## 2021-06-14 ENCOUNTER — Other Ambulatory Visit: Payer: Self-pay

## 2021-06-14 VITALS — BP 110/82 | HR 78 | Temp 98.3°F | Ht 66.0 in | Wt 156.0 lb

## 2021-06-14 DIAGNOSIS — I480 Paroxysmal atrial fibrillation: Secondary | ICD-10-CM | POA: Diagnosis not present

## 2021-06-14 DIAGNOSIS — Z0000 Encounter for general adult medical examination without abnormal findings: Secondary | ICD-10-CM

## 2021-06-14 DIAGNOSIS — K219 Gastro-esophageal reflux disease without esophagitis: Secondary | ICD-10-CM | POA: Diagnosis not present

## 2021-06-14 DIAGNOSIS — M810 Age-related osteoporosis without current pathological fracture: Secondary | ICD-10-CM

## 2021-06-14 DIAGNOSIS — I700 Atherosclerosis of aorta: Secondary | ICD-10-CM

## 2021-06-14 DIAGNOSIS — R7303 Prediabetes: Secondary | ICD-10-CM

## 2021-06-14 DIAGNOSIS — E782 Mixed hyperlipidemia: Secondary | ICD-10-CM

## 2021-06-14 LAB — CBC WITH DIFFERENTIAL/PLATELET
Basophils Absolute: 0 10*3/uL (ref 0.0–0.1)
Basophils Relative: 0.5 % (ref 0.0–3.0)
Eosinophils Absolute: 0.2 10*3/uL (ref 0.0–0.7)
Eosinophils Relative: 2.5 % (ref 0.0–5.0)
HCT: 40.1 % (ref 36.0–46.0)
Hemoglobin: 13.3 g/dL (ref 12.0–15.0)
Lymphocytes Relative: 20.8 % (ref 12.0–46.0)
Lymphs Abs: 1.5 10*3/uL (ref 0.7–4.0)
MCHC: 33.2 g/dL (ref 30.0–36.0)
MCV: 86.9 fl (ref 78.0–100.0)
Monocytes Absolute: 0.4 10*3/uL (ref 0.1–1.0)
Monocytes Relative: 5.5 % (ref 3.0–12.0)
Neutro Abs: 5 10*3/uL (ref 1.4–7.7)
Neutrophils Relative %: 70.7 % (ref 43.0–77.0)
Platelets: 265 10*3/uL (ref 150.0–400.0)
RBC: 4.61 Mil/uL (ref 3.87–5.11)
RDW: 13.3 % (ref 11.5–15.5)
WBC: 7 10*3/uL (ref 4.0–10.5)

## 2021-06-14 LAB — HEMOGLOBIN A1C: Hgb A1c MFr Bld: 5.9 % (ref 4.6–6.5)

## 2021-06-14 LAB — COMPREHENSIVE METABOLIC PANEL
ALT: 33 U/L (ref 0–35)
AST: 27 U/L (ref 0–37)
Albumin: 4.4 g/dL (ref 3.5–5.2)
Alkaline Phosphatase: 48 U/L (ref 39–117)
BUN: 19 mg/dL (ref 6–23)
CO2: 28 mEq/L (ref 19–32)
Calcium: 9.9 mg/dL (ref 8.4–10.5)
Chloride: 106 mEq/L (ref 96–112)
Creatinine, Ser: 0.86 mg/dL (ref 0.40–1.20)
GFR: 68.11 mL/min (ref >60.00)
Glucose, Bld: 94 mg/dL (ref 70–99)
Potassium: 4 mEq/L (ref 3.5–5.1)
Sodium: 141 mEq/L (ref 135–145)
Total Bilirubin: 0.6 mg/dL (ref 0.2–1.2)
Total Protein: 7.2 g/dL (ref 6.0–8.3)

## 2021-06-14 LAB — TSH: TSH: 0.65 u[IU]/mL (ref 0.35–5.50)

## 2021-06-14 LAB — LIPID PANEL
Cholesterol: 151 mg/dL (ref 0–200)
HDL: 60.4 mg/dL (ref >39.00)
LDL Cholesterol: 72 mg/dL (ref 0–99)
NonHDL: 90.88
Total CHOL/HDL Ratio: 3
Triglycerides: 93 mg/dL (ref 0.0–149.0)
VLDL: 18.6 mg/dL (ref 0.0–40.0)

## 2021-06-14 NOTE — Assessment & Plan Note (Signed)
Chronic GERD controlled Continue pepcid 20 mg qd

## 2021-06-14 NOTE — Assessment & Plan Note (Addendum)
Chronic On boniva 150 mg monthly  - prescribed by gyn Taking calcium and vitamin d dexa up to date

## 2021-06-14 NOTE — Assessment & Plan Note (Signed)
H/o afib sees cardiology On metoprolol In NSR Cbc, cmp, tsh

## 2021-06-14 NOTE — Assessment & Plan Note (Signed)
Chronic Check lipid panel  Continue zetia 10 mg qd, pravastatin 40 mg qd Regular exercise and healthy diet encouraged

## 2021-06-14 NOTE — Assessment & Plan Note (Signed)
Chronic Continue pravastatin 40 mg daily and zetia 10 mg daily Check lipids, cmp

## 2021-06-14 NOTE — Assessment & Plan Note (Signed)
Chronic Check a1c Low sugar / carb diet Stressed regular exercise  

## 2021-06-14 NOTE — Assessment & Plan Note (Signed)
Chronic Taking miralax daily and uses suppositories as needed

## 2021-06-15 ENCOUNTER — Telehealth: Payer: Self-pay | Admitting: Neurology

## 2021-06-15 NOTE — Telephone Encounter (Signed)
Patient called for results from her recent test. She is not sure of the name of the test. She took the test 06/02/21 and was told it would be about a month.  Patient just wanted Dr. Carles Collet to know she took the test.

## 2021-06-15 NOTE — Telephone Encounter (Signed)
Its only been 10 days since she did it so it may not be done yet.  I haven't gotten the results yet.  Please call Kara Ramirez and see if they have results yet

## 2021-06-15 NOTE — Telephone Encounter (Signed)
Reminder has been set in [facility] for me to call [facility] on 07/03/21 to check if patients results are in. Patient performed test on 06/02/21 and was advised it would take a month to come back.

## 2021-06-21 ENCOUNTER — Ambulatory Visit (HOSPITAL_BASED_OUTPATIENT_CLINIC_OR_DEPARTMENT_OTHER): Payer: PPO | Attending: Internal Medicine | Admitting: Physical Therapy

## 2021-06-21 ENCOUNTER — Encounter (HOSPITAL_BASED_OUTPATIENT_CLINIC_OR_DEPARTMENT_OTHER): Payer: Self-pay | Admitting: Physical Therapy

## 2021-06-21 ENCOUNTER — Other Ambulatory Visit: Payer: Self-pay

## 2021-06-21 DIAGNOSIS — M6281 Muscle weakness (generalized): Secondary | ICD-10-CM | POA: Diagnosis not present

## 2021-06-21 DIAGNOSIS — R279 Unspecified lack of coordination: Secondary | ICD-10-CM | POA: Insufficient documentation

## 2021-06-21 DIAGNOSIS — R293 Abnormal posture: Secondary | ICD-10-CM | POA: Diagnosis not present
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
## 2021-06-23 ENCOUNTER — Other Ambulatory Visit: Payer: Self-pay

## 2021-06-23 ENCOUNTER — Telehealth: Payer: Self-pay | Admitting: Neurology

## 2021-06-23 ENCOUNTER — Telehealth: Payer: Self-pay | Admitting: Allergy

## 2021-06-23 MED ORDER — DOXYCYCLINE HYCLATE 100 MG PO CAPS: 100.0000 mg | ORAL_CAPSULE | Freq: Two times a day (BID) | ORAL | 0 refills | Status: AC

## 2021-06-23 NOTE — Telephone Encounter (Signed)
Please advise 

## 2021-06-23 NOTE — Telephone Encounter (Signed)
Sample is in house and in process at [place] they got the sample on July 22nd its a 28 day turn around results should be ready on Aug 19th

## 2021-06-23 NOTE — Telephone Encounter (Signed)
-----   Message from [place], DO sent at 06/05/2021  7:56 AM EDT ----- Call [facility] and find out status of her test.  Her [facility] account number is [phone]

## 2021-06-23 NOTE — Telephone Encounter (Signed)
Doxycycline 100 mg prescription has been sent in to [facility]. Patient was informed and verbalized understanding

## 2021-06-23 NOTE — Telephone Encounter (Signed)
Patient states she's developed a sinus infection. She's experiencing headaches, her sinuses hurt and she has a cough. She's taking her normal Claritin and nasal spray but it is not helping. She is wondering if anything could be sent in to her to help with the symptoms.  [facility] on [address]

## 2021-06-27 ENCOUNTER — Ambulatory Visit: Payer: PPO | Admitting: Cardiovascular Disease

## 2021-07-05 ENCOUNTER — Telehealth: Payer: Self-pay | Admitting: Neurology

## 2021-07-05 NOTE — Telephone Encounter (Signed)
Pt called and informed that [facility] ataxia genetic panel was negative.  Dr tat would like for her to see/consult with Dr. Posey Pronto if she is agreeable. Pt stated she would see Dr Posey Pronto message sent to the front to get her scheduled

## 2021-07-05 NOTE — Telephone Encounter (Signed)
Let pt know that her [facility] ataxia genetic panel was negative.  I would like her to see/consult with Dr. Posey Pronto if she is agreeable.

## 2021-07-05 NOTE — Telephone Encounter (Signed)
Pt is sch for 09-01-21 with Posey Pronto

## 2021-07-12 ENCOUNTER — Encounter (HOSPITAL_BASED_OUTPATIENT_CLINIC_OR_DEPARTMENT_OTHER): Payer: Self-pay | Admitting: Physical Therapy

## 2021-07-12 ENCOUNTER — Other Ambulatory Visit: Payer: Self-pay

## 2021-07-12 ENCOUNTER — Ambulatory Visit (HOSPITAL_BASED_OUTPATIENT_CLINIC_OR_DEPARTMENT_OTHER): Payer: PPO | Admitting: Physical Therapy

## 2021-07-12 DIAGNOSIS — M6281 Muscle weakness (generalized): Secondary | ICD-10-CM

## 2021-07-12 DIAGNOSIS — R279 Unspecified lack of coordination: Secondary | ICD-10-CM

## 2021-07-12 DIAGNOSIS — R293 Abnormal posture: Secondary | ICD-10-CM
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
## 2021-07-21 ENCOUNTER — Encounter: Payer: Self-pay | Admitting: Cardiovascular Disease

## 2021-07-21 ENCOUNTER — Ambulatory Visit: Payer: PPO | Admitting: Cardiovascular Disease

## 2021-07-21 ENCOUNTER — Other Ambulatory Visit: Payer: Self-pay

## 2021-07-21 VITALS — BP 128/77 | HR 81 | Ht 66.0 in | Wt 155.0 lb

## 2021-07-21 DIAGNOSIS — R0789 Other chest pain: Secondary | ICD-10-CM

## 2021-07-21 DIAGNOSIS — E782 Mixed hyperlipidemia: Secondary | ICD-10-CM | POA: Diagnosis not present

## 2021-07-21 NOTE — Patient Instructions (Signed)
Medication Instructions:  Your physician recommends that you continue on your current medications as directed. Please refer to the Current Medication list given to you today.  *If you need a refill on your cardiac medications before your next appointment, please call your pharmacy*  Follow-Up: At [facility], you and your health needs are our priority.  As part of our continuing mission to provide you with exceptional heart care, we have created designated Provider Care Teams.  These Care Teams include your primary Cardiologist (physician) and Advanced Practice Providers (APPs -  Physician Assistants and Nurse Practitioners) who all work together to provide you with the care you need, when you need it.  We recommend signing up for the patient portal called "MyChart".  Sign up information is provided on this After Visit Summary.  MyChart is used to connect with patients for Virtual Visits (Telemedicine).  Patients are able to view lab/test results, encounter notes, upcoming appointments, etc.  Non-urgent messages can be sent to your provider as well.   To learn more about what you can do with MyChart, go to [email or web address].    Your next appointment:   12 month(s)  The format for your next appointment:   In Person  Provider:   You may see Quay Burow, MD or one of the following Advanced Practice Providers on your designated Care Team:   [place], PA-C Coletta Memos, FNP   Other Instructions

## 2021-07-21 NOTE — Progress Notes (Signed)
07/21/2021 JOCABED BOSTON   [birth year]  QR:[id number]  Primary Physician Binnie Rail, MD Primary Cardiologist: Lorretta Harp MD [name], [place]  HPI:  Kara Ramirez is a 70 y.o.  mildly overweight married Caucasian female mother of 2, grandmother of 1 grandchild whose husband [name] is also patient mild is accompanying her today. She is retired from working in Press photographer.  I last saw her in the office 06/28/2020.[name] Her primary care physician is Dr. Billey Gosling. I last saw her in the office 05/27/15. She really has no risk factors other than mild hyperlipidemia on red yeast rice. Her sister did have myocardial infarctions. She's had chest pain off and on for a year which is somewhat atypical. It occurs typically last for minutes at a time with occasional associated shortness of breath. Performed 2-D echocardiography and Myoview stress testing July 2016 which were normal. She was recently seen at [facility] in September with chest pain and shortness of breath. A dobutamine echo was normal. She saw Almyra Deforest [facility] in the  office 09/02/17 who ordered an event monitor that did show some brief runs of PAF.     She did have a VATS procedure by Dr. Roxan Hockey revealing a mass which was nonmalignant.  Coronary CTA performed 12/10/2017 revealing a coronary calcium score 125 with minimal CAD.     Since I saw her a year ago she is done well.  She gets occasional atypical chest pain.  Her most recent lipid profile performed 06/14/2021 revealed total cholesterol of 151, LDL 72 and HDL 60.  Current Meds  Medication Sig   acetaminophen (TYLENOL) 325 MG tablet Take 1-2 tablets (325-650 mg total) by mouth every 4 (four) hours as needed for mild pain. (Patient taking differently: Take 325 mg by mouth every 4 (four) hours as needed for mild pain or headache.)   albuterol (VENTOLIN HFA) 108 (90 Base) MCG/ACT inhaler INHALE 1 TO 2 PUFF(S) BY MOUTH INTO THE LUNGS EVERY 6 HOURS AS NEEDED FOR WHEEZING OR  SHORTNESS OF BREATH   aspirin-acetaminophen-caffeine (EXCEDRIN MIGRAINE) 250-250-65 MG tablet Take 1 tablet by mouth every 6 (six) hours as needed for headache.   Budeson-Glycopyrrol-Formoterol (BREZTRI AEROSPHERE) 160-9-4.8 MCG/ACT AERO Inhale 2 puffs into the lungs in the morning and at bedtime.   Calcium Carbonate-Vitamin D 600-400 MG-UNIT tablet Take 1 tablet by mouth daily.   Carboxymethylcellul-Glycerin 1-0.9 % GEL Place 1 application into both eyes at bedtime.   cholecalciferol (VITAMIN D) 1000 UNITS tablet Take 1,000 Units by mouth daily.   Co-Enzyme Q-10 100 MG CAPS Take 100 mg by mouth daily.    divalproex (DEPAKOTE) 125 MG DR tablet Take 1 tablet (125 mg total) by mouth 2 (two) times daily.   famotidine (PEPCID) 20 MG tablet TAKE ONE TABLET BY MOUTH DAILY   fluocinonide ointment (LIDEX) [id number] % Apply 1 application topically See admin instructions. Apply topically twice daily for 5 days alternating with Tacrolimus ointment   fluticasone (FLONASE) 50 MCG/ACT nasal spray USE 1 SPRAY IN EACH NOSTRIL MID-DAY FOR CONGESTION (Patient taking differently: Place 1 spray into both nostrils daily.)   ibandronate (BONIVA) 150 MG tablet Take 150 mg by mouth every 30 (thirty) days. Take in the morning with a full glass of water, on an empty stomach, and do not take anything else by mouth or lie down for the next 30 min.    ipratropium (ATROVENT) 0.03 % nasal spray Place 2 sprays into both nostrils every 4 (four)  hours as needed for rhinitis.   levocetirizine (XYZAL) 5 MG tablet Take 1 tablet (5 mg total) by mouth every evening.   magnesium oxide (MAG-OX) 400 MG tablet Take 400 mg by mouth at bedtime.   MALIC ACID PO Take 1 capsule by mouth at bedtime.   methylcellulose (CITRUCEL) oral powder Take 1 packet by mouth daily.   metoprolol tartrate (LOPRESSOR) 25 MG tablet TAKE 1/2 TABLET BY MOUTH TWICE A DAY   mirabegron ER (MYRBETRIQ) 50 MG TB24 tablet Take 50 mg by mouth daily as needed.    naproxen  sodium (ALEVE) 220 MG tablet Take 220 mg by mouth daily as needed.    Olopatadine HCl 0.2 % SOLN Apply to eye 2 (two) times daily as needed.   Omega-3 Fatty Acids (FISH OIL) 1000 MG CAPS Take 1,000 mg by mouth at bedtime.    Phenylephrine-Witch Hazel (HEMORRHOIDAL COOLING) 0.25-50 % GEL Apply topically.   polyethylene glycol (MIRALAX / GLYCOLAX) 17 g packet Take 17 g by mouth daily.   pravastatin (PRAVACHOL) 40 MG tablet Take 1 tablet (40 mg total) by mouth daily.   senna (SENOKOT) 8.6 MG tablet Take 1 tablet by mouth daily.    sodium chloride (OCEAN) 0.65 % SOLN nasal spray Place 1 spray into both nostrils as needed for congestion. (Patient taking differently: Place 1 spray into both nostrils 4 (four) times daily as needed for congestion.)   tacrolimus (PROTOPIC) 0.1 % ointment Apply 1 application topically 2 (two) times daily as needed (PRN skin issues). (Patient taking differently: Apply 1 application topically See admin instructions. Apply topically twice daily for 5 days alternating with Fluocinonide ointment)   Thiamine HCl (VITAMIN B-1) 100 MG tablet Take 100 mg by mouth daily.   triamcinolone ointment (KENALOG) 0.5 % Apply 1 application topically 2 (two) times daily. For rash on left lower leg     Allergies  Allergen Reactions   Mold Extract [Trichophyton Mentagrophyte] Shortness Of Breath and Rash   Penicillins Shortness Of Breath, Rash and Other (See Comments)    Eyes puffy Has taken low dose pcn and no rx REACTION: rash, SOB Has patient had a PCN reaction causing immediate rash, facial/tongue/throat swelling, SOB or lightheadedness with hypotension: yes Has patient had a PCN reaction causing severe rash involving mucus membranes or skin necrosis: unk Has patient had a PCN reaction that required hospitalization: no Has patient had a PCN reaction occurring within the last 10 years: unk If all of the above answers are "NO", then may proceed with Cephalospor   Morphine Other (See  Comments)    REACTION: tachycardia and anxiety   Peanut Oil Nausea And Vomiting    Peanut butter   Protonix [Pantoprazole Sodium] Nausea And Vomiting   Atorvastatin     MYAGLIAS   Citrus Other (See Comments)    Unknown   Peanut-Containing Drug Products Other (See Comments)    Unknown   Rosuvastatin     MYALGIAS   Tramadol Other (See Comments)    Makes crazy;confused   Valium [Diazepam] Other (See Comments)    Confusion per family   Cetirizine Rash and Other (See Comments)    Around face   Codeine Other (See Comments)    REACTION: dizzy and "groggy in my head"   Eggs Or Egg-Derived Products Nausea And Vomiting   Latex Itching   Pentazocine Lactate Palpitations    Social History   Socioeconomic History   Marital status: Married    Spouse name: Not on file  Number of children: 2   Years of education: Not on file   Highest education level: Not on file  Occupational History   Occupation: retired    Fish farm manager: [employer]  Tobacco Use   Smoking status: Never   Smokeless tobacco: Never  Vaping Use   Vaping Use: Never used  Substance and Sexual Activity   Alcohol use: Never    Alcohol/week: 0.0 standard drinks   Drug use: Never   Sexual activity: Not Currently  Other Topics Concern   Not on file  Social History Narrative   Right handed   One story home   Slight caffeine   Lives with husband   Social Determinants of Health   Financial Resource Strain: Low Risk    Difficulty of Paying Living Expenses: Not hard at all  Food Insecurity: No Food Insecurity   Worried About Charity fundraiser in the Last Year: Never true   Arboriculturist in the Last Year: Never true  Transportation Needs: No Transportation Needs   Lack of Transportation (Medical): No   Lack of Transportation (Non-Medical): No  Physical Activity: Sufficiently Active   Days of Exercise per Week: 5 days   Minutes of Exercise per Session: 30 min  Stress: No Stress Concern Present    Feeling of Stress : Not at all  Social Connections: Not on file  Intimate Partner Violence: Not on file     Review of Systems: General: negative for chills, fever, night sweats or weight changes.  Cardiovascular: negative for chest pain, dyspnea on exertion, edema, orthopnea, palpitations, paroxysmal nocturnal dyspnea or shortness of breath Dermatological: negative for rash Respiratory: negative for cough or wheezing Urologic: negative for hematuria Abdominal: negative for nausea, vomiting, diarrhea, bright red blood per rectum, melena, or hematemesis Neurologic: negative for visual changes, syncope, or dizziness All other systems reviewed and are otherwise negative except as noted above.    Blood pressure 128/77, pulse 81, height '5\' 6"'$  (1.676 m), weight 155 lb (70.3 kg), SpO2 97 %.  General appearance: alert and no distress Neck: no adenopathy, no carotid bruit, no JVD, supple, symmetrical, trachea midline, and thyroid not enlarged, symmetric, no tenderness/mass/nodules Lungs: clear to auscultation bilaterally Heart: regular rate and rhythm, S1, S2 normal, no murmur, click, rub or gallop Extremities: extremities normal, atraumatic, no cyanosis or edema Pulses: 2+ and symmetric Skin: Skin color, texture, turgor normal. No rashes or lesions Neurologic: Grossly normal  EKG sinus rhythm at 81 with nonspecific ST and T wave changes.  Personally reviewed this EKG.  ASSESSMENT AND PLAN:   Hyperlipidemia History of hyperlipidemia on statin therapy and Zetia with lipid profile performed 06/14/2021 revealing total cholesterol 51, LDL 72 and HDL 60.  Chest tightness History of atypical chest pain with a coronary CTA performed 12/10/2017 revealing nonobstructive CAD with a coronary calcium score of 125.     Lorretta Harp MD FACP,FACC,FAHA, [facility] 07/21/2021 10:58 AM

## 2021-07-21 NOTE — Assessment & Plan Note (Signed)
History of atypical chest pain with a coronary CTA performed 12/10/2017 revealing nonobstructive CAD with a coronary calcium score of 125.

## 2021-07-21 NOTE — Assessment & Plan Note (Signed)
History of hyperlipidemia on statin therapy and Zetia with lipid profile performed 06/14/2021 revealing total cholesterol 51, LDL 72 and HDL 60.

## 2021-08-15 ENCOUNTER — Other Ambulatory Visit: Payer: Self-pay | Admitting: *Deleted

## 2021-08-17 ENCOUNTER — Other Ambulatory Visit: Payer: Self-pay

## 2021-08-17 MED ORDER — METOPROLOL TARTRATE 25 MG PO TABS: 12.5000 mg | ORAL_TABLET | Freq: Two times a day (BID) | ORAL | 3 refills | Status: DC

## 2021-09-01 ENCOUNTER — Telehealth: Payer: Self-pay

## 2021-09-01 ENCOUNTER — Other Ambulatory Visit: Payer: Self-pay

## 2021-09-01 ENCOUNTER — Ambulatory Visit: Payer: PPO | Admitting: Neurology

## 2021-09-01 ENCOUNTER — Encounter: Payer: Self-pay | Admitting: Neurology

## 2021-09-01 VITALS — BP 145/83 | HR 78 | Ht 66.0 in | Wt 154.0 lb

## 2021-09-01 DIAGNOSIS — R251 Tremor, unspecified: Secondary | ICD-10-CM

## 2021-09-01 DIAGNOSIS — R270 Ataxia, unspecified: Secondary | ICD-10-CM | POA: Diagnosis not present
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
## 2021-09-01 NOTE — Progress Notes (Signed)
Chronic Care Management Pharmacy Assistant   Name: Kara Ramirez  MRN: [id number] DOB: [birth year]   Reason for Encounter: Disease State   Conditions to be addressed/monitored: HLD   Recent office visits:  06/14/21 Binnie Rail, MD-PCP (Annual Exam) no med changes 03/06/21 Binnie Rail, MD-PCP (Dizziness) no med changes Recent consult visits:  07/21/21 Lorretta Harp, MD-Cardiology (Mixed hyperlipidemia) no med changes 05/16/21 Tat, Eustace Quail, DO-Neurology (Ataxia) no med changes 03/22/21 Kennith Gain, MD- Allergy (Mild persistent asthma, uncomplicated) med changes:levocetirizine (XYZAL) 5 MG tablet Hospital visits:  None in previous 6 months  Medications: Outpatient Encounter Medications as of 09/01/2021  Medication Sig   acetaminophen (TYLENOL) 325 MG tablet Take 1-2 tablets (325-650 mg total) by mouth every 4 (four) hours as needed for mild pain. (Patient taking differently: Take 325 mg by mouth every 4 (four) hours as needed for mild pain or headache.)   albuterol (VENTOLIN HFA) 108 (90 Base) MCG/ACT inhaler INHALE 1 TO 2 PUFF(S) BY MOUTH INTO THE LUNGS EVERY 6 HOURS AS NEEDED FOR WHEEZING OR SHORTNESS OF BREATH   aspirin-acetaminophen-caffeine (EXCEDRIN MIGRAINE) 250-250-65 MG tablet Take 1 tablet by mouth every 6 (six) hours as needed for headache.   Budeson-Glycopyrrol-Formoterol (BREZTRI AEROSPHERE) 160-9-4.8 MCG/ACT AERO Inhale 2 puffs into the lungs in the morning and at bedtime.   Calcium Carbonate-Vitamin D 600-400 MG-UNIT tablet Take 1 tablet by mouth daily.   Carboxymethylcellul-Glycerin 1-0.9 % GEL Place 1 application into both eyes at bedtime.   cholecalciferol (VITAMIN D) 1000 UNITS tablet Take 1,000 Units by mouth daily.   Co-Enzyme Q-10 100 MG CAPS Take 100 mg by mouth daily.    divalproex (DEPAKOTE) 125 MG DR tablet Take 1 tablet (125 mg total) by mouth 2 (two) times daily.   ezetimibe (ZETIA) 10 MG tablet Take 1 tablet (10 mg total) by mouth  daily.   famotidine (PEPCID) 20 MG tablet TAKE ONE TABLET BY MOUTH DAILY   fluocinonide ointment (LIDEX) 0.10 % Apply 1 application topically See admin instructions. Apply topically twice daily for 5 days alternating with Tacrolimus ointment   fluticasone (FLONASE) 50 MCG/ACT nasal spray USE 1 SPRAY IN EACH NOSTRIL MID-DAY FOR CONGESTION (Patient taking differently: Place 1 spray into both nostrils daily.)   ibandronate (BONIVA) 150 MG tablet Take 150 mg by mouth every 30 (thirty) days. Take in the morning with a full glass of water, on an empty stomach, and do not take anything else by mouth or lie down for the next 30 min.    ipratropium (ATROVENT) 0.03 % nasal spray Place 2 sprays into both nostrils every 4 (four) hours as needed for rhinitis.   levocetirizine (XYZAL) 5 MG tablet Take 1 tablet (5 mg total) by mouth every evening.   magnesium oxide (MAG-OX) 400 MG tablet Take 400 mg by mouth at bedtime.   MALIC ACID PO Take 1 capsule by mouth at bedtime.   methylcellulose (CITRUCEL) oral powder Take 1 packet by mouth daily.   metoprolol tartrate (LOPRESSOR) 25 MG tablet Take 0.5 tablets (12.5 mg total) by mouth 2 (two) times daily.   mirabegron ER (MYRBETRIQ) 50 MG TB24 tablet Take 50 mg by mouth daily as needed.    naproxen sodium (ALEVE) 220 MG tablet Take 220 mg by mouth daily as needed.    Olopatadine HCl 0.2 % SOLN Apply to eye 2 (two) times daily as needed.   Omega-3 Fatty Acids (FISH OIL) 1000 MG CAPS Take 1,000 mg by mouth at  bedtime.    Phenylephrine-Witch Hazel (HEMORRHOIDAL COOLING) 0.25-50 % GEL Apply topically.   polyethylene glycol (MIRALAX / GLYCOLAX) 17 g packet Take 17 g by mouth daily.   pravastatin (PRAVACHOL) 40 MG tablet Take 1 tablet (40 mg total) by mouth daily.   senna (SENOKOT) 8.6 MG tablet Take 1 tablet by mouth daily.    sodium chloride (OCEAN) 0.65 % SOLN nasal spray Place 1 spray into both nostrils as needed for congestion. (Patient taking differently: Place 1 spray  into both nostrils 4 (four) times daily as needed for congestion.)   tacrolimus (PROTOPIC) 0.1 % ointment Apply 1 application topically 2 (two) times daily as needed (PRN skin issues). (Patient taking differently: Apply 1 application topically See admin instructions. Apply topically twice daily for 5 days alternating with Fluocinonide ointment)   Thiamine HCl (VITAMIN B-1) 100 MG tablet Take 100 mg by mouth daily.   triamcinolone ointment (KENALOG) 0.5 % Apply 1 application topically 2 (two) times daily. For rash on left lower leg   No facility-administered encounter medications on file as of 09/01/2021.   09/01/2021 Name: Kara Ramirez MRN: [id number] DOB: [birth year] Kara Ramirez is a 70 y.o. year old female who is a primary care patient of Burns, Claudina Lick, MD.  Comprehensive medication review performed; Spoke to patient regarding cholesterol  Lipid Panel    Component Value Date/Time   CHOL 151 06/14/2021 1024   CHOL 166 01/02/2021 1056   TRIG 93.0 06/14/2021 1024   HDL 60.40 06/14/2021 1024   HDL 65 01/02/2021 1056   LDLCALC 72 06/14/2021 1024   LDLCALC 81 01/02/2021 1056    10-year ASCVD risk score: The 10-year ASCVD risk score (Arnett DK, et al., 2019) is: 29%   Values used to calculate the score:     Age: 70 years     Sex: Female     Is Non-Hispanic African American: No     Diabetic: Yes     Tobacco smoker: No     Systolic Blood Pressure: 633 mmHg     Is BP treated: Yes     HDL Cholesterol: 60.4 mg/dL     Total Cholesterol: 151 mg/dL  Current antihyperlipidemic regimen:  Pravastin 40 mg Ezetimibe 10 mg Previous antihyperlipidemic medications tried: none noted  ASCVD risk enhancing conditions: age >88  What recent interventions/DTPs have been made by any provider to improve Cholesterol control since last CPP Visit: none noted  Any recent hospitalizations or ED visits since last visit with CPP? No  What diet changes have been made to improve Cholesterol?  Patient  states that she does watch what she eats but has not made any changes  What exercise is being done to improve Cholesterol?  Patient does not exercise because she has has issues with walking  Adherence Review: Does the patient have >5 day gap between last estimated fill dates? No   Care Gaps: Colonoscopy-06/07/20 Diabetic Foot Exam-NA Mammogram-11/09/19 Ophthalmology-NA Dexa Scan -11/06/18  Annual Well Visit - 06/14/21 Micro albumin-NA Hemoglobin A1c- 06/14/21  Star Rating Drugs: Pravastatin 40 mg last fill 06/28/21 90 ds  [place] Pharmacist Assistant [phone]

## 2021-09-04 ENCOUNTER — Telehealth: Payer: Self-pay | Admitting: Neurology

## 2021-09-04 ENCOUNTER — Other Ambulatory Visit: Payer: Self-pay

## 2021-09-04 DIAGNOSIS — R270 Ataxia, unspecified: Secondary | ICD-10-CM

## 2021-09-04 NOTE — Telephone Encounter (Signed)
Referral has been sent in for Dr Roe Coombs at [facility]

## 2021-09-04 NOTE — Telephone Encounter (Signed)
Let pt know that I discussed her case with Dr. Posey Pronto and given that she has no definitive answer as of yet, I would like to get a 2nd opinion at academic center.  [facility] likely with the shortest wait (probably [facility], but happy to get her to [facility] if she would prefer).  [facility] has traditionally had a VERY long wait.  Ask her preference, if any, and if agreeable.

## 2021-09-14 ENCOUNTER — Telehealth: Payer: Self-pay | Admitting: Allergy

## 2021-09-14 NOTE — Telephone Encounter (Signed)
Patient called and said that she has another sinus infection, she is coughing and her head hurts.[facility] [place] [phone].

## 2021-09-21 ENCOUNTER — Telehealth (INDEPENDENT_AMBULATORY_CARE_PROVIDER_SITE_OTHER): Payer: PPO | Admitting: Family Medicine

## 2021-09-21 DIAGNOSIS — R0982 Postnasal drip: Secondary | ICD-10-CM | POA: Diagnosis not present

## 2021-09-21 DIAGNOSIS — J0100 Acute maxillary sinusitis, unspecified: Secondary | ICD-10-CM | POA: Diagnosis not present

## 2021-09-21 MED ORDER — AZITHROMYCIN 250 MG PO TABS: ORAL_TABLET | ORAL | 0 refills | Status: AC

## 2021-09-21 NOTE — Progress Notes (Signed)
Virtual Visit via Telephone Note  I connected with Kara Ramirez on 09/21/21 at  3:30 PM EDT by telephone and verified that I am speaking with the correct person using two identifiers.   I discussed the limitations, risks, security and privacy concerns of performing an evaluation and management service by telephone and the availability of in person appointments. I also discussed with the patient that there may be a patient responsible charge related to this service. The patient expressed understanding and agreed to proceed.  Location patient: home Location provider: work or home office Participants present for the call: patient, provider Patient did not have a visit in the prior 7 days to address this/these issue(s).   History of Present Illness: Pt is a 70 yo female with pmh sig for A fib, asthma, allergies, Pre DM, HLD, fibromyalgia, expressive aphasia s/p closed head injury, GERD, IBS who was seen for acute concern, followed by Billey Gosling, MD.  Pt with coughing, fatigued, mild sore throat, rhinorrhea, facial pain below eyes, sinus HA, hoarseness. Denies ear pain/pressure, n/v, diarrhea Symptoms started early last wk. Pt thinks symptoms may be allergies. Tried mucinex.  Also taking claritin.   Observations/Objective: Patient sounds cheerful and well on the phone. I do not appreciate any SOB. Speech and thought processing are grossly intact. Patient reported vitals:  Assessment and Plan: Acute maxillary sinusitis, recurrence not specified  -supportive care including rest, hydration, OTC meds for cough and cold. -Ok to continue antihistamine prn. -given precautions - Plan: azithromycin (ZITHROMAX) 250 MG tablet  Post-nasal drainage -likely contributing to cough and sore throat -continue antihistamine  Follow Up Instructions:  F/u prn   [id number] 5-10 [id number] 11-20 9443 21-30 I did not refer this patient for an OV in the next 24 hours for this/these issue(s).  I discussed  the assessment and treatment plan with the patient. The patient was provided an opportunity to ask questions and all were answered. The patient agreed with the plan and demonstrated an understanding of the instructions.   The patient was advised to call back or seek an in-person evaluation if the symptoms worsen or if the condition fails to improve as anticipated.  I provided 14 minutes of non-face-to-face time during this encounter.   Billie Ruddy, MD

## 2021-09-25 ENCOUNTER — Telehealth: Payer: Self-pay | Admitting: Cardiovascular Disease

## 2021-09-25 MED ORDER — PRAVASTATIN SODIUM 40 MG PO TABS: 40.0000 mg | ORAL_TABLET | Freq: Every day | ORAL | 3 refills | Status: AC

## 2021-09-25 MED ORDER — EZETIMIBE 10 MG PO TABS: 10.0000 mg | ORAL_TABLET | Freq: Every day | ORAL | 3 refills | Status: DC

## 2021-09-25 NOTE — Telephone Encounter (Signed)
*  STAT* If patient is at the pharmacy, call can be transferred to refill team.   1. Which medications need to be refilled? (please list name of each medication and dose if known)  pravastatin (PRAVACHOL) 40 MG tablet ezetimibe (ZETIA) 10 MG tablet  2. Which pharmacy/location (including street and city if local pharmacy) is medication to be sent to? [place] [phone]  3. Do they need a 30 day or 90 day supply? 90 day

## 2021-10-06 ENCOUNTER — Other Ambulatory Visit: Payer: Self-pay

## 2021-10-06 ENCOUNTER — Ambulatory Visit (INDEPENDENT_AMBULATORY_CARE_PROVIDER_SITE_OTHER): Payer: PPO

## 2021-10-06 DIAGNOSIS — R7303 Prediabetes: Secondary | ICD-10-CM

## 2021-10-06 DIAGNOSIS — J4520 Mild intermittent asthma, uncomplicated: Secondary | ICD-10-CM

## 2021-10-06 DIAGNOSIS — E782 Mixed hyperlipidemia: Secondary | ICD-10-CM

## 2021-10-06 NOTE — Progress Notes (Signed)
Chronic Care Management Pharmacy Note  10/06/2021 Name:  Kara Ramirez MRN:  [id number] DOB:  1951/06/15  Summary: -Spoke with patient, updated and confirmed current medication list -Patient reports that she is following closely with neurology about ataxia, may be referred to specialist at [facility] in the future  -Patient has no current issues or concerns with her medications   Recommendations/Changes made from today's visit: -Take medications as directed, continue to follow closely with neurology, patient will update clinic with any issues or concerns regarding her medications   Subjective: Kara Ramirez is an 70 y.o. year old female who is a primary patient of Burns, Claudina Lick, MD.  The CCM team was consulted for assistance with disease management and care coordination needs.    Engaged with patient by telephone for follow up visit in response to provider referral for pharmacy case management and/or care coordination services.   Consent to Services:  The patient was given the following information about Chronic Care Management services today, agreed to services, and gave verbal consent: 1. CCM service includes personalized support from designated clinical staff supervised by the primary care provider, including individualized plan of care and coordination with other care providers 2. 24/7 contact phone numbers for assistance for urgent and routine care needs. 3. Service will only be billed when office clinical staff spend 20 minutes or more in a month to coordinate care. 4. Only one practitioner may furnish and bill the service in a calendar month. 5.The patient may stop CCM services at any time (effective at the end of the month) by phone call to the office staff. 6. The patient will be responsible for cost sharing (co-pay) of up to 20% of the service fee (after annual deductible is met). Patient agreed to services and consent obtained.  Patient Care Team: Binnie Rail, MD as PCP -  General (Internal Medicine) Lorretta Harp, MD as PCP - Cardiology (Cardiology) Lorretta Harp, MD as Consulting Physician (Cardiology) Rana Snare, MD (Inactive) as Consulting Physician (Urology) Kennith Gain, MD as Consulting Physician (Allergy) Tat, Eustace Quail, DO as Consulting Physician (Neurology) Charlton Haws, [facility] as Pharmacist (Pharmacist)  Recent office visits: 06/14/2021 - Dr. Quay Burow - no changes - f/u in 1 year   Recent consult visits: 09/21/2021 - Dr. Volanda Napoleon - evaluation of cough, fatigue, sore throat, HA - course of azithromycin 09/01/2021 - Dr. Posey Pronto - Neurology - evaluation of ataxia - suggested CSF testing and/ or consider seeking second opinion  07/21/2021 - Dr. Gwenlyn Found - Cardiology - no changes to medication - follow up in 1 year   Hospital visits: None in previous 6 months  Objective:  Lab Results  Component Value Date   CREATININE 0.86 06/14/2021   BUN 19 06/14/2021   GFR 68.11 06/14/2021   GFRNONAA >60 04/25/2020   GFRAA >60 04/25/2020   NA 141 06/14/2021   K 4.0 06/14/2021   CALCIUM 9.9 06/14/2021   CO2 28 06/14/2021   GLUCOSE 94 06/14/2021    Lab Results  Component Value Date/Time   HGBA1C 5.9 06/14/2021 10:24 AM   HGBA1C 5.8 12/14/2019 09:42 AM   GFR 68.11 06/14/2021 10:24 AM   GFR 70.99 12/14/2019 09:42 AM    Last diabetic Eye exam:  No results found for: HMDIABEYEEXA  Last diabetic Foot exam:  No results found for: HMDIABFOOTEX   Lab Results  Component Value Date   CHOL 151 06/14/2021   HDL 60.40 06/14/2021   LDLCALC 72 06/14/2021  TRIG 93.0 06/14/2021   CHOLHDL 3 06/14/2021    Hepatic Function Latest Ref Rng & Units 06/14/2021 01/02/2021 08/31/2020  Total Protein 6.0 - 8.3 g/dL 7.2 7.0 7.0  Albumin 3.5 - 5.2 g/dL 4.4 4.8 4.7  AST 0 - 37 U/L 27 37 25  ALT 0 - 35 U/L 33 42(H) 24  Alk Phosphatase 39 - 117 U/L 48 63 57  Total Bilirubin 0.2 - 1.2 mg/dL 0.6 0.5 0.4  Bilirubin, Direct 0.00 - 0.40 mg/dL - 0.13 0.12     Lab Results  Component Value Date/Time   TSH 0.65 06/14/2021 10:24 AM   TSH 0.54 06/04/2019 10:04 AM    CBC Latest Ref Rng & Units 06/14/2021 04/25/2020 08/11/2019  WBC 4.0 - 10.5 K/uL 7.0 8.6 8.6  Hemoglobin 12.0 - 15.0 g/dL 13.3 13.9 13.4  Hematocrit 36.0 - 46.0 % 40.1 43.1 41.1  Platelets 150.0 - 400.0 K/uL 265.0 248 -    No results found for: VD25OH  Clinical ASCVD: No  The 10-year ASCVD risk score (Arnett DK, et al., 2019) is: 29%   Values used to calculate the score:     Age: 28 years     Sex: Female     Is Non-Hispanic African American: No     Diabetic: Yes     Tobacco smoker: No     Systolic Blood Pressure: 580 mmHg     Is BP treated: Yes     HDL Cholesterol: 60.4 mg/dL     Total Cholesterol: 151 mg/dL    Depression screen [facility] 2/9 09/02/2020 12/14/2019 12/12/2018  Decreased Interest 0 0 1  Down, Depressed, Hopeless 0 0 0  PHQ - 2 Score 0 0 1  Altered sleeping - - 1  Tired, decreased energy - - 1  Change in appetite - - 1  Feeling bad or failure about yourself  - - 0  Trouble concentrating - - 2  Moving slowly or fidgety/restless - - 0  Suicidal thoughts - - 0  PHQ-9 Score - - 6  Some recent data might be hidden    Social History   Tobacco Use  Smoking Status Never  Smokeless Tobacco Never   BP Readings from Last 3 Encounters:  09/01/21 (!) 145/83  07/21/21 128/77  06/14/21 110/82   Pulse Readings from Last 3 Encounters:  09/01/21 78  07/21/21 81  06/14/21 78   Wt Readings from Last 3 Encounters:  09/01/21 154 lb (69.9 kg)  07/21/21 155 lb (70.3 kg)  06/14/21 156 lb (70.8 kg)   BMI Readings from Last 3 Encounters:  09/01/21 24.86 kg/m  07/21/21 25.02 kg/m  06/14/21 25.18 kg/m    Assessment/Interventions: Review of patient past medical history, allergies, medications, health status, including review of consultants reports, laboratory and other test data, was performed as part of comprehensive evaluation and provision of chronic care  management services.   SDOH:  (Social Determinants of Health) assessments and interventions performed: Yes  SDOH Screenings   Alcohol Screen: Not on file  Depression (PHQ2-9): Not on file  Financial Resource Strain: Not on file  Food Insecurity: Not on file  Housing: Not on file  Physical Activity: Not on file  Social Connections: Not on file  Stress: Not on file  Tobacco Use: Low Risk    Smoking Tobacco Use: Never   Smokeless Tobacco Use: Never   Passive Exposure: Not on file  Transportation Needs: Not on file    CCM Care Plan  Allergies  Allergen Reactions  Mold Extract [Trichophyton Mentagrophyte] Shortness Of Breath and Rash   Penicillins Shortness Of Breath, Rash and Other (See Comments)    Eyes puffy Has taken low dose pcn and no rx REACTION: rash, SOB Has patient had a PCN reaction causing immediate rash, facial/tongue/throat swelling, SOB or lightheadedness with hypotension: yes Has patient had a PCN reaction causing severe rash involving mucus membranes or skin necrosis: unk Has patient had a PCN reaction that required hospitalization: no Has patient had a PCN reaction occurring within the last 10 years: unk If all of the above answers are "NO", then may proceed with Cephalospor   Morphine Other (See Comments)    REACTION: tachycardia and anxiety   Peanut Oil Nausea And Vomiting    Peanut butter   Protonix [Pantoprazole Sodium] Nausea And Vomiting   Atorvastatin     MYAGLIAS   Citrus Other (See Comments)    Unknown   Peanut-Containing Drug Products Other (See Comments)    Unknown   Rosuvastatin     MYALGIAS   Tramadol Other (See Comments)    Makes crazy;confused   Valium [Diazepam] Other (See Comments)    Confusion per family   Cetirizine Rash and Other (See Comments)    Around face   Codeine Other (See Comments)    REACTION: dizzy and "groggy in my head"   Eggs Or Egg-Derived Products Nausea And Vomiting   Latex Itching   Pentazocine Lactate  Palpitations    Medications Reviewed Today     Reviewed by Anderson Malta, CMA (Certified Medical Assistant) on 09/21/21 at 1545  Med List Status: <None>   Medication Order Taking? Sig Documenting Provider Last Dose Status Informant  acetaminophen (TYLENOL) 325 MG tablet [id number] No Take 1-2 tablets (325-650 mg total) by mouth every 4 (four) hours as needed for mild pain.  Patient taking differently: Take 325 mg by mouth every 4 (four) hours as needed for mild pain or headache.   Bary Leriche, PA-C Taking Active   albuterol (VENTOLIN HFA) 108 (90 Base) MCG/ACT inhaler [id number] No INHALE 1 TO 2 PUFF(S) BY MOUTH INTO THE LUNGS EVERY 6 HOURS AS NEEDED FOR WHEEZING OR SHORTNESS OF BREATH Valentina Shaggy, MD Taking Active   aspirin-acetaminophen-caffeine [facility] MIGRAINE) [phone] MG tablet [id number] No Take 1 tablet by mouth every 6 (six) hours as needed for headache. [provider] Taking Active Self  Budeson-Glycopyrrol-Formoterol (BREZTRI AEROSPHERE) 160-9-4.8 MCG/ACT AERO [id number] No Inhale 2 puffs into the lungs in the morning and at bedtime. [provider] Taking Active   Calcium Carbonate-Vitamin D 600-400 MG-UNIT tablet [id number] No Take 1 tablet by mouth daily. [provider] Taking Active Self  Carboxymethylcellul-Glycerin 1-0.9 % GEL [id number] No Place 1 application into both eyes at bedtime. [provider] Taking Active Self  cholecalciferol (VITAMIN D) 1000 UNITS tablet [id number] No Take 1,000 Units by mouth daily. [provider] Taking Active Self  Co-Enzyme Q-10 100 MG CAPS [id number] No Take 100 mg by mouth daily.  [provider] Taking Active Self  divalproex (DEPAKOTE) 125 MG DR tablet [id number] No Take 1 tablet (125 mg total) by mouth 2 (two) times daily. Ludwig Clarks, DO Taking Active   ezetimibe (ZETIA) 10 MG tablet [id number] No Take 1 tablet (10 mg total) by mouth daily. Lorretta Harp, MD Taking Active    famotidine (PEPCID) 20 MG tablet [id number] No TAKE ONE TABLET BY MOUTH DAILY Armbruster, Carlota Raspberry, MD Taking Active   fluocinonide ointment (LIDEX) 0.05 % [id number]  No Apply 1 application topically See admin instructions. Apply topically twice daily for 5 days alternating with Tacrolimus ointment [provider] Taking Active Self  fluticasone (FLONASE) 50 MCG/ACT nasal spray [id number] No USE 1 SPRAY IN EACH NOSTRIL MID-DAY FOR CONGESTION  Patient taking differently: Place 1 spray into both nostrils daily.   Kennith Gain, MD Taking Active   ibandronate (BONIVA) 150 MG tablet [id number] No Take 150 mg by mouth every 30 (thirty) days. Take in the morning with a full glass of water, on an empty stomach, and do not take anything else by mouth or lie down for the next 30 min.  [provider] Taking Active   ipratropium (ATROVENT) 0.03 % nasal spray [id number] No Place 2 sprays into both nostrils every 4 (four) hours as needed for rhinitis. Kennith Gain, MD Taking Active   levocetirizine (XYZAL) 5 MG tablet [id number] No Take 1 tablet (5 mg total) by mouth every evening. Kennith Gain, MD Taking Active   magnesium oxide (MAG-OX) 400 MG tablet [id number] No Take 400 mg by mouth at bedtime. [provider] Taking Active   MALIC ACID PO [id number] No Take 1 capsule by mouth at bedtime. [provider] Taking Active Self  methylcellulose (CITRUCEL) oral powder [id number] No Take 1 packet by mouth daily. Yetta Flock, MD Taking Active   metoprolol tartrate (LOPRESSOR) 25 MG tablet [id number] No Take 0.5 tablets (12.5 mg total) by mouth 2 (two) times daily. Lorretta Harp, MD Taking Active   mirabegron ER [facility]) 50 MG TB24 tablet [id number] No Take 50 mg by mouth daily as needed.  [provider] Taking Active   naproxen sodium (ALEVE) 220 MG tablet [id number] No Take 220 mg by mouth daily as needed.  [provider] Taking Active Self  Olopatadine HCl 0.2 % SOLN [id number] No Apply to eye 2 (two) times daily as needed. [provider] Taking Active   Omega-3 Fatty Acids (FISH OIL) 1000 MG CAPS [id number] No Take 1,000 mg by mouth at bedtime.  [provider] Taking Active Self  Phenylephrine-Witch Hazel (HEMORRHOIDAL COOLING) 0.25-50 % GEL [id number] No Apply topically. [provider] Taking Active   polyethylene glycol (MIRALAX / GLYCOLAX) 17 g packet [id number] No Take 17 g by mouth daily. [provider] Taking Active   pravastatin (PRAVACHOL) 40 MG tablet [id number] No Take 1 tablet (40 mg total) by mouth daily. Lorretta Harp, MD Taking Active   senna (SENOKOT) 8.6 MG tablet [id number] No Take 1 tablet by mouth daily.  [provider] Taking Active   sodium chloride (OCEAN) 0.65 % SOLN nasal spray [id number] No Place 1 spray into both nostrils as needed for congestion.  Patient taking differently: Place 1 spray into both nostrils 4 (four) times daily as needed for congestion.   Bary Leriche, PA-C Taking Active   tacrolimus (PROTOPIC) 0.1 % ointment [id number] No Apply 1 application topically 2 (two) times daily as needed (PRN skin issues).  Patient taking differently: Apply 1 application topically See admin instructions. Apply topically twice daily for 5 days alternating with Fluocinonide ointment   Love, Ivan Anchors, PA-C Taking Active Self  Thiamine HCl (VITAMIN B-1) 100 MG tablet [id number] No Take 100 mg by mouth daily. [provider] Taking Active Self  triamcinolone ointment (KENALOG) 0.5 % [id number] No Apply 1 application topically 2 (two) times daily. For rash on left lower leg Quay Burow, Claudina Lick, MD Taking Active  Patient Active Problem List   Diagnosis Date Noted   GERD (gastroesophageal reflux disease) 06/14/2021   Aortic atherosclerosis ([place]) 06/14/2021   Ataxia 03/06/2021   Intermittent lightheadedness 03/06/2021   Acute  sinusitis 10/03/2020   Mild persistent asthma with acute exacerbation 10/03/2020   Seasonal and perennial allergic rhinitis 10/03/2020   Nummular eczema 06/13/2020   Dizziness 04/26/2020   Headache 04/26/2020   Fatigue 04/26/2020   Vaginal prolapse 12/17/2018   Jerking 09/22/2018   Closed head injury 09/11/2018   Mild intermittent asthma without complication 06/09/5749   Multinodular thyroid, follow up US in 05/2019 06/10/2018   Fibromyalgia 05/26/2018   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 to 5 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 ([place]) 03/25/2018   Coccygeal pain 03/25/2018   Difficulty with speech 03/24/2018   Poor balance 03/24/2018   Hip pain 03/24/2018   SAH (subarachnoid hemorrhage) ([place]) 03/06/2018   Chest tightness 02/04/2018   Left lower lobe pulmonary nodule 12/10/2017   Paroxysmal atrial fibrillation (HCC) 10/30/2017   Chronic sinusitis 03/14/2017   Severe scoliosis 12/11/2016   Prediabetes 06/06/2016   Cough 06/06/2016   Allergic rhinitis 02/08/2016   Osteoporosis 12/05/2015   Hyperlipidemia 05/27/2015   Constipation 08/23/2011    Immunization History  Administered Date(s) Administered   Fluad Quad(high Dose 65+) 12/14/2019   Influenza, High Dose Seasonal PF 12/05/2015, 08/30/2017, 09/22/2018   Influenza, Quadrivalent, Recombinant, Inj, Pf 10/29/2013, 10/04/2014   Influenza-Unspecified 10/29/2013, 10/04/2014   PFIZER(Purple Top)SARS-COV-2 Vaccination 01/24/2020, 02/23/2020, 09/03/2020   Pneumococcal Conjugate-13 01/27/2014   Pneumococcal Polysaccharide-23 12/26/2016   Zoster Recombinat (Shingrix) 01/07/2019, 05/14/2019    Conditions to be addressed/monitored:  Hyperlipidemia, Atrial Fibrillation, Coronary Artery Disease, Asthma, and Allergic Rhinitis  There are no care plans that you recently modified to display for this patient.     Medication Assistance: None required.  Patient affirms current coverage meets needs.  Care Gaps: COVID booster /  Influenza vaccine   Patient's preferred pharmacy is:  [facility] [id number] - [place], [place] [place] [address] Phone: [phone] Fax: [phone]   Uses pill box? Yes Pt endorses 100% compliance  Care Plan and Follow Up Patient Decision:  Patient agrees to Care Plan and Follow-up.  Plan: Telephone follow up appointment with care management team member scheduled for:  6 months The patient has been provided with contact information for the care management team and has been advised to call with any health related questions or concerns.   Tomasa Blase, PharmD Clinical Pharmacist, [place]

## 2021-10-06 NOTE — Patient Instructions (Signed)
Visit Information  Thank you for taking time to visit with me today. Please don't hesitate to contact me if I can be of assistance to you before our next scheduled telephone appointment.  Telephone follow up appointment with care management team member scheduled for: The patient has been provided with contact information for the care management team and has been advised to call with any health related questions or concerns.   If you need to cancel or re-schedule our visit, please call [phone] and our care guide team will be happy to assist you.  Following is a list of the goals we discussed today:   Manage My Medications   Timeframe:  Long-Range Goal Priority:  High Start Date:  10/06/2021                           Expected End Date:  10/06/2022                     Follow Up Date 04/05/2022   - call for medicine refill 2 or 3 days before it runs out - call if I am sick and can't take my medicine - keep a list of all the medicines I take; vitamins and herbals too - learn to read medicine labels - use a pillbox to sort medicine    Why is this important?   These steps will help you keep on track with your medicines  Patient verbalizes understanding of instructions provided today and agrees to view in [place].   Tomasa Blase, PharmD Clinical Pharmacist, [place]

## 2021-10-16 DIAGNOSIS — G253 Myoclonus: Secondary | ICD-10-CM | POA: Diagnosis not present

## 2021-10-16 DIAGNOSIS — R270 Ataxia, unspecified: Secondary | ICD-10-CM | POA: Diagnosis not present

## 2021-10-18 DIAGNOSIS — E782 Mixed hyperlipidemia: Secondary | ICD-10-CM

## 2021-10-18 DIAGNOSIS — J4520 Mild intermittent asthma, uncomplicated: Secondary | ICD-10-CM

## 2021-10-26 ENCOUNTER — Telehealth: Payer: Self-pay | Admitting: Internal Medicine

## 2021-10-26 NOTE — Telephone Encounter (Signed)
N/A unable to leave a message for patient to call back to schedule Medicare Annual Wellness Visit   Last AWV  09/02/20  Please schedule at anytime with [facility] if patient calls the office back.    40 Minutes appointment   Any questions, please call me at [phone]

## 2021-10-27 ENCOUNTER — Telehealth: Payer: Self-pay

## 2021-10-27 NOTE — Progress Notes (Signed)
Chronic Care Management Pharmacy Assistant   Name: BRITTAIN SMITHEY  MRN: [id number] DOB: 1951-07-13    Reason for Encounter: Disease State   Conditions to be addressed/monitored: HLD   Recent office visits:  None ID  Recent consult visits:  None ID  Hospital visits:  None in previous 6 months  Medications: Outpatient Encounter Medications as of 10/27/2021  Medication Sig   acetaminophen (TYLENOL) 325 MG tablet Take 1-2 tablets (325-650 mg total) by mouth every 4 (four) hours as needed for mild pain. (Patient taking differently: Take 325 mg by mouth every 4 (four) hours as needed for mild pain or headache.)   albuterol (VENTOLIN HFA) 108 (90 Base) MCG/ACT inhaler INHALE 1 TO 2 PUFF(S) BY MOUTH INTO THE LUNGS EVERY 6 HOURS AS NEEDED FOR WHEEZING OR SHORTNESS OF BREATH   Apoaequorin (PREVAGEN PO) Take 1 tablet by mouth daily.   aspirin-acetaminophen-caffeine (EXCEDRIN MIGRAINE) 250-250-65 MG tablet Take 1 tablet by mouth every 6 (six) hours as needed for headache.   Budeson-Glycopyrrol-Formoterol (BREZTRI AEROSPHERE) 160-9-4.8 MCG/ACT AERO Inhale 2 puffs into the lungs in the morning and at bedtime.   Calcium Carbonate-Vitamin D 600-400 MG-UNIT tablet Take 1 tablet by mouth daily.   Carboxymethylcellul-Glycerin 1-0.9 % GEL Place 1 application into both eyes at bedtime.   cholecalciferol (VITAMIN D) 1000 UNITS tablet Take 1,000 Units by mouth daily.   Co-Enzyme Q-10 100 MG CAPS Take 100 mg by mouth daily.    ezetimibe (ZETIA) 10 MG tablet Take 1 tablet (10 mg total) by mouth daily.   famotidine (PEPCID) 20 MG tablet TAKE ONE TABLET BY MOUTH DAILY   fluocinonide ointment (LIDEX) 3.14 % Apply 1 application topically See admin instructions. Apply topically twice daily for 5 days alternating with Tacrolimus ointment   fluticasone (FLONASE) 50 MCG/ACT nasal spray USE 1 SPRAY IN EACH NOSTRIL MID-DAY FOR CONGESTION (Patient taking differently: Place 1 spray into both nostrils daily.)    ibandronate (BONIVA) 150 MG tablet Take 150 mg by mouth every 30 (thirty) days. Take in the morning with a full glass of water, on an empty stomach, and do not take anything else by mouth or lie down for the next 30 min.    ipratropium (ATROVENT) 0.03 % nasal spray Place 2 sprays into both nostrils every 4 (four) hours as needed for rhinitis.   loratadine (CLARITIN) 10 MG tablet Take 10 mg by mouth daily.   MALIC ACID PO Take 1 capsule by mouth at bedtime.   Menthol-Methyl Salicylate (SALONPAS PAIN RELIEF PATCH EX) Apply 1 patch topically daily as needed.   methylcellulose (CITRUCEL) oral powder Take 1 packet by mouth daily.   metoprolol tartrate (LOPRESSOR) 25 MG tablet Take 0.5 tablets (12.5 mg total) by mouth 2 (two) times daily.   mirabegron ER (MYRBETRIQ) 50 MG TB24 tablet Take 50 mg by mouth daily as needed.  (Patient not taking: Reported on 10/06/2021)   naproxen sodium (ALEVE) 220 MG tablet Take 220 mg by mouth daily as needed.    Olopatadine HCl 0.2 % SOLN Apply to eye 2 (two) times daily as needed.   Omega-3 Fatty Acids (FISH OIL) 1000 MG CAPS Take 1,000 mg by mouth at bedtime.    polyethylene glycol (MIRALAX / GLYCOLAX) 17 g packet Take 17 g by mouth daily as needed.   pravastatin (PRAVACHOL) 40 MG tablet Take 1 tablet (40 mg total) by mouth daily.   senna (SENOKOT) 8.6 MG tablet Take 1 tablet by mouth daily.    sodium  chloride (OCEAN) 0.65 % SOLN nasal spray Place 1 spray into both nostrils as needed for congestion. (Patient taking differently: Place 1 spray into both nostrils 4 (four) times daily as needed for congestion.)   tacrolimus (PROTOPIC) 0.1 % ointment Apply 1 application topically 2 (two) times daily as needed (PRN skin issues). (Patient taking differently: Apply 1 application topically See admin instructions. Apply topically twice daily for 5 days alternating with Fluocinonide ointment)   Thiamine HCl (VITAMIN B-1) 100 MG tablet Take 100 mg by mouth daily.   triamcinolone  ointment (KENALOG) 0.5 % Apply 1 application topically 2 (two) times daily. For rash on left lower leg   No facility-administered encounter medications on file as of 10/27/2021.   Lipid Panel    Component Value Date/Time   CHOL 151 06/14/2021 1024   CHOL 166 01/02/2021 1056   TRIG 93.0 06/14/2021 1024   HDL 60.40 06/14/2021 1024   HDL 65 01/02/2021 1056   LDLCALC 72 06/14/2021 1024   LDLCALC 81 01/02/2021 1056    10/27/2021 Name: JORITA BOHANON MRN: [id number] DOB: November 01, 1951 [name] is a 70 y.o. year old female who is a primary care patient of Burns, Claudina Lick, MD.  Comprehensive medication review performed; Spoke to patient regarding cholesterol  10-year ASCVD risk score: The 10-year ASCVD risk score (Arnett DK, et al., 2019) is: 29%   Values used to calculate the score:     Age: 64 years     Sex: Female     Is Non-Hispanic African American: No     Diabetic: Yes     Tobacco smoker: No     Systolic Blood Pressure: 315 mmHg     Is BP treated: Yes     HDL Cholesterol: 60.4 mg/dL     Total Cholesterol: 151 mg/dL  Current antihyperlipidemic regimen:  Pravastatin 40 mg daily Ez[place]imbie 10 mg daily Previous antihyperlipidemic medications tried: none ASCVD risk enhancing conditions: age >42 What recent interventions/DTPs have been made by any provider to improve Cholesterol control since last CPP Visit: None noted Any recent hospitalizations or ED visits since last visit with CPP? No What diet changes have been made to improve Cholesterol?  Patient states that she has not changed anything  What exercise is being done to improve Cholesterol?  Patient states that she goes to the stores and walk around with the cart for activity some days  Adherence Review: Does the patient have >5 day gap between last estimated fill dates? No    Care Gaps: Colonoscopy-06/07/20 Diabetic Foot Exam- Mammogram-11/09/19 Ophthalmology-NA Dexa Scan - 11/06/18 Annual Well Visit - 06/14/21 Micro  albumin-NA Hemoglobin A1c- 06/14/21  Star Rating Drugs: Pravastatin 40 mg last fill 09/25/21 90 ds  Ethelene Hal Clinical Pharmacist Assistant [phone]

## 2021-10-31 ENCOUNTER — Encounter: Payer: Self-pay | Admitting: Family Medicine

## 2021-10-31 ENCOUNTER — Telehealth (INDEPENDENT_AMBULATORY_CARE_PROVIDER_SITE_OTHER): Payer: PPO | Admitting: Family Medicine

## 2021-10-31 DIAGNOSIS — R6889 Other general symptoms and signs: Secondary | ICD-10-CM

## 2021-10-31 MED ORDER — BENZONATATE 100 MG PO CAPS: 100.0000 mg | ORAL_CAPSULE | Freq: Three times a day (TID) | ORAL | 0 refills | Status: DC | PRN

## 2021-10-31 NOTE — Patient Instructions (Addendum)
°  HOME CARE TIPS:  -COVID19 testing information: [email or web address]  Most pharmacies also offer testing and home test kits. If the Covid19 test is positive and you desire antiviral treatment, please contact a [facility] or schedule a follow up virtual visit through your primary care office or through the [facility].  Other test to treat options: [email or web address]  -I sent the medication(s) we discussed to your pharmacy: Meds ordered this encounter  Medications   benzonatate (TESSALON PERLES) 100 MG capsule    Sig: Take 1 capsule (100 mg total) by mouth 3 (three) times daily as needed.    Dispense:  30 capsule    Refill:  0   -can use tylenol if needed for fevers, aches and pains per instructions  -can use nasal saline a few times per day if you have nasal congestion  -stay hydrated, drink plenty of fluids and eat small healthy meals - avoid dairy  -can take 1000 IU (40mcg) Vit D3 and 100-500 mg of Vit C daily per instructions  -If the Covid test is positive, check out the [facility] website for more information on home care, transmission and treatment for COVID19  -follow up with your doctor in 2-3 days unless improving and feeling better  -stay home while sick, except to seek medical care. If you have COVID19, you will likely be contagious for 7-10 days. Flu or Influenza is likely contagious for about 7 days. Other respiratory viral infections remain contagious for 5-10+ days depending on the virus and many other factors. Wear a good mask that fits snugly (such as N95 or KN95) if around others to reduce the risk of transmission.  It was nice to meet you today, and I really hope you are feeling better soon. I help [facility] out with telemedicine visits on Tuesdays and Thursdays and am happy to help if you need a follow up virtual visit on those days. Otherwise, if you have any concerns or questions following this  visit please schedule a follow up visit with your Primary Care doctor or seek care at a local urgent care clinic to avoid delays in care.    Seek in person care or schedule a follow up video visit promptly if your symptoms worsen, new concerns arise or you are not improving with treatment. Call 911 and/or seek emergency care if your symptoms are severe or life threatening.
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
## 2021-11-29 DIAGNOSIS — S069X1D Unspecified intracranial injury with loss of consciousness of 30 minutes or less, subsequent encounter: Secondary | ICD-10-CM | POA: Diagnosis not present

## 2021-12-05 ENCOUNTER — Emergency Department (HOSPITAL_COMMUNITY): Payer: PPO

## 2021-12-05 ENCOUNTER — Encounter (HOSPITAL_COMMUNITY): Payer: Self-pay | Admitting: Emergency Medicine

## 2021-12-05 ENCOUNTER — Telehealth: Payer: Self-pay | Admitting: Internal Medicine

## 2021-12-05 ENCOUNTER — Emergency Department (HOSPITAL_COMMUNITY)
Admission: EM | Admit: 2021-12-05 | Discharge: 2021-12-05 | Disposition: A | Discharge status: Home / Self Care | Payer: PPO | Attending: Emergency Medicine | Admitting: Emergency Medicine

## 2021-12-05 DIAGNOSIS — Z79899 Other long term (current) drug therapy: Secondary | ICD-10-CM | POA: Insufficient documentation

## 2021-12-05 DIAGNOSIS — M5136 Other intervertebral disc degeneration, lumbar region: Secondary | ICD-10-CM

## 2021-12-05 DIAGNOSIS — Z7982 Long term (current) use of aspirin: Secondary | ICD-10-CM | POA: Insufficient documentation

## 2021-12-05 DIAGNOSIS — R251 Tremor, unspecified: Secondary | ICD-10-CM

## 2021-12-05 DIAGNOSIS — Z91040 Latex allergy status: Secondary | ICD-10-CM | POA: Diagnosis not present

## 2021-12-05 DIAGNOSIS — M5124 Other intervertebral disc displacement, thoracic region: Secondary | ICD-10-CM | POA: Diagnosis not present

## 2021-12-05 DIAGNOSIS — R531 Weakness: Secondary | ICD-10-CM

## 2021-12-05 DIAGNOSIS — M47814 Spondylosis without myelopathy or radiculopathy, thoracic region: Secondary | ICD-10-CM | POA: Diagnosis not present

## 2021-12-05 DIAGNOSIS — M48061 Spinal stenosis, lumbar region without neurogenic claudication: Secondary | ICD-10-CM | POA: Diagnosis not present

## 2021-12-05 DIAGNOSIS — R269 Unspecified abnormalities of gait and mobility: Secondary | ICD-10-CM

## 2021-12-05 DIAGNOSIS — R42 Dizziness and giddiness: Secondary | ICD-10-CM | POA: Diagnosis not present

## 2021-12-05 DIAGNOSIS — M47816 Spondylosis without myelopathy or radiculopathy, lumbar region: Secondary | ICD-10-CM | POA: Diagnosis not present

## 2021-12-05 DIAGNOSIS — M4326 Fusion of spine, lumbar region: Secondary | ICD-10-CM | POA: Diagnosis not present

## 2021-12-05 DIAGNOSIS — M4184 Other forms of scoliosis, thoracic region: Secondary | ICD-10-CM | POA: Diagnosis not present

## 2021-12-05 DIAGNOSIS — M5134 Other intervertebral disc degeneration, thoracic region: Secondary | ICD-10-CM

## 2021-12-05 DIAGNOSIS — Z981 Arthrodesis status: Secondary | ICD-10-CM | POA: Diagnosis not present

## 2021-12-05 LAB — BASIC METABOLIC PANEL
Anion gap: 10 (ref 5–15)
BUN: 17 mg/dL (ref 8–23)
CO2: 23 mmol/L (ref 22–32)
Calcium: 9.8 mg/dL (ref 8.9–10.3)
Chloride: 110 mmol/L (ref 98–111)
Creatinine, Ser: 0.84 mg/dL (ref 0.44–1.00)
GFR, Estimated: >60 mL/min (ref >60)
Glucose, Bld: 105 mg/dL — ABNORMAL HIGH (ref 70–99)
Potassium: 3.9 mmol/L (ref 3.5–5.1)
Sodium: 143 mmol/L (ref 135–145)

## 2021-12-05 LAB — URINALYSIS, ROUTINE W REFLEX MICROSCOPIC
Bilirubin Urine: NEGATIVE
Glucose, UA: NEGATIVE mg/dL
Ketones, ur: NEGATIVE mg/dL
Nitrite: NEGATIVE
Protein, ur: NEGATIVE mg/dL
Specific Gravity, Urine: 1.01 (ref 1.005–1.030)
pH: 5.5 (ref 5.0–8.0)

## 2021-12-05 LAB — CBC
HCT: 40.5 % (ref 36.0–46.0)
Hemoglobin: 13.6 g/dL (ref 12.0–15.0)
MCH: 29.8 pg (ref 26.0–34.0)
MCHC: 33.6 g/dL (ref 30.0–36.0)
MCV: 88.6 fL (ref 80.0–100.0)
Platelets: 251 10*3/uL (ref 150–400)
RBC: 4.57 MIL/uL (ref 3.87–5.11)
RDW: 12.9 % (ref 11.5–15.5)
WBC: 5.9 10*3/uL (ref 4.0–10.5)
nRBC: 0 % (ref 0.0–0.2)

## 2021-12-05 LAB — URINALYSIS, MICROSCOPIC (REFLEX)

## 2021-12-05 MED ORDER — LORAZEPAM 2 MG/ML IJ SOLN
0.5000 mg | Freq: Once | INTRAMUSCULAR | Status: AC
  Administered 2021-12-05: 0.5 mg via INTRAVENOUS
  Filled 2021-12-05: qty 1
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
## 2021-12-05 NOTE — ED Provider Triage Note (Addendum)
Emergency Medicine Provider Triage Evaluation Note  Kara Ramirez , a 71 y.o. female  was evaluated in triage.  Pt complains of dizziness for a week and a half.  Patient states that she has been feeling increasingly dizzy and tired, this is often resolved by taking a nap but today it was not.  She states that her dizziness is made worse when she gets up too quickly or turns her head too fast.  She has not taken anything for her symptoms.  She discussed her symptoms with her PCP, and they sent her to the emergency department for evaluation.  She is on chronic anticoagulation, denies head trauma.  Review of Systems  Positive: Dizziness, headache, fatigue Negative: Syncope, falls, chest pain, shortness of breath  Physical Exam  BP 122/87 (BP Location: Left Arm)    Pulse 91    Temp 99.2 F (37.3 C) (Oral)    Resp 14    SpO2 96%  Gen:   Awake, no distress   Resp:  Normal effort  MSK:   Moves extremities without difficulty  Other:  5/5 strength in all extremities, no focal deficits  Medical Decision Making  Medically screening exam initiated at 1:42 PM.  Appropriate orders placed.  Kara Ramirez was informed that the remainder of the evaluation will be completed by another provider, this initial triage assessment does not replace that evaluation, and the importance of remaining in the ED until their evaluation is complete.     Kara Plummer, PA-C 12/05/21 1342    Kara Wernli T, PA-C 12/05/21 1342

## 2021-12-05 NOTE — ED Triage Notes (Signed)
Patient here with complaint of having trouble with balance while walking for the last week and one a half. Patient state she has been feeling dizzy and tired but that is usually resolved by taking a nap, but now today it does not. Patient alert, oriented, and in no apparent distress at this time. Patient denies loss of consciousness, denies falling.

## 2021-12-05 NOTE — ED Notes (Signed)
Taken to [facility] by transporter at this time.  

## 2021-12-05 NOTE — Discharge Instructions (Addendum)
It was our pleasure to provide your ER care today - we hope that you feel better.  Follow up with your neurologist in the next 1-2 weeks - call office tomorrow to arrange appointment.   Also follow up closely with your primary care doctor.  We did send a urine culture the results of which should be back in two days time - check MyChart and/or have your doctor check on results then (in addition, our RN should contact you if positive).   Return to ER if worse, new symptoms, fevers, new or severe pain, one-sided numbness/weakness, change in speech or vision, trouble breathing, or other concern.

## 2021-12-05 NOTE — ED Notes (Signed)
Dr.Steinl notified that pt is very claustrophobic. Pt states she needs almost sedated to be able to tolerate MRI. MRI is aware to notify RN when they send transport to take pt to MRI.

## 2021-12-05 NOTE — ED Provider Notes (Signed)
[facility] Provider Note   CSN: [id number] Arrival date & time: 12/05/21  1308     History  Chief Complaint  Patient presents with   Dizziness    Kara Ramirez is a 71 y.o. female.  Patient c/o worsening gait abnormality in past year, and especially in the past couple weeks. Symptoms gradual onset, moderate, persistent, worsening. Indicates has seen neurology for same, gait disturbance, ataxia, halting gait, and myoclonic type tremulousness. States has been seen both locally and at tertiary care center, and no specific dx. Indicates was told 'no stroke', and that she does not have Parkinson's, but no specific dx was given. No change in tremor or other symptoms, but states in past 1-2 weeks increased slowness and halting nature of gait. Denies any new, focal, or unilateral extremity numbness or weakness. No problems w normal bowel or bladder control. No fevers. No change in speech or vision. No falls. No neck or back pain. No change in meds. No recent febrile/viral illness.   The history is provided by the patient, the spouse and medical records.  Dizziness Associated symptoms: no chest pain, no diarrhea, no headaches, no shortness of breath and no vomiting       Home Medications Prior to Admission medications   Medication Sig Start Date End Date Taking? Authorizing Provider  acetaminophen (TYLENOL) 325 MG tablet Take 1-2 tablets (325-650 mg total) by mouth every 4 (four) hours as needed for mild pain. Patient taking differently: Take 325 mg by mouth every 4 (four) hours as needed for mild pain or headache. 03/18/18   Love, Ivan Anchors, PA-C  albuterol (VENTOLIN HFA) 108 (90 Base) MCG/ACT inhaler INHALE 1 TO 2 PUFF(S) BY MOUTH INTO THE LUNGS EVERY 6 HOURS AS NEEDED FOR WHEEZING OR SHORTNESS OF BREATH 12/29/20   Valentina Shaggy, MD  Apoaequorin (PREVAGEN PO) Take 1 tablet by mouth daily.    [provider]   aspirin-acetaminophen-caffeine (EXCEDRIN MIGRAINE) [phone] MG tablet Take 1 tablet by mouth every 6 (six) hours as needed for headache.    [provider]  benzonatate (TESSALON PERLES) 100 MG capsule Take 1 capsule (100 mg total) by mouth 3 (three) times daily as needed. 10/31/21   Lucretia Kern, DO  Budeson-Glycopyrrol-Formoterol (BREZTRI AEROSPHERE) 160-9-4.8 MCG/ACT AERO Inhale 2 puffs into the lungs in the morning and at bedtime.    [provider]  Calcium Carbonate-Vitamin D 600-400 MG-UNIT tablet Take 1 tablet by mouth daily.    [provider]  Carboxymethylcellul-Glycerin 1-0.9 % GEL Place 1 application into both eyes at bedtime.    [provider]  cholecalciferol (VITAMIN D) 1000 UNITS tablet Take 1,000 Units by mouth daily.    [provider]  Co-Enzyme Q-10 100 MG CAPS Take 100 mg by mouth daily.     [provider]  ezetimibe (ZETIA) 10 MG tablet Take 1 tablet (10 mg total) by mouth daily. 09/25/21 12/24/21  Lorretta Harp, MD  famotidine (PEPCID) 20 MG tablet TAKE ONE TABLET BY MOUTH DAILY 06/02/21   Armbruster, Carlota Raspberry, MD  fluocinonide ointment (LIDEX) 6.14 % Apply 1 application topically See admin instructions. Apply topically twice daily for 5 days alternating with Tacrolimus ointment    [provider]  fluticasone (FLONASE) 50 MCG/ACT nasal spray USE 1 SPRAY IN EACH NOSTRIL MID-DAY FOR CONGESTION Patient taking differently: Place 1 spray into both nostrils daily. 11/03/18   Kennith Gain, MD  ibandronate (BONIVA) 150 MG tablet Take  150 mg by mouth every 30 (thirty) days. Take in the morning with a full glass of water, on an empty stomach, and do not take anything else by mouth or lie down for the next 30 min.     [provider]  ipratropium (ATROVENT) 0.03 % nasal spray Place 2 sprays into both nostrils every 4 (four) hours as needed for rhinitis. 12/17/19   Kennith Gain, MD   loratadine (CLARITIN) 10 MG tablet Take 10 mg by mouth daily.    [provider]  MALIC ACID PO Take 1 capsule by mouth at bedtime.    [provider]  Menthol-Methyl Salicylate (SALONPAS PAIN RELIEF PATCH EX) Apply 1 patch topically daily as needed.    [provider]  methylcellulose (CITRUCEL) oral powder Take 1 packet by mouth daily. 05/04/20   Armbruster, Carlota Raspberry, MD  metoprolol tartrate (LOPRESSOR) 25 MG tablet Take 0.5 tablets (12.5 mg total) by mouth 2 (two) times daily. 08/17/21   Lorretta Harp, MD  mirabegron ER (MYRBETRIQ) 50 MG TB24 tablet Take 50 mg by mouth daily as needed.    [provider]  naproxen sodium (ALEVE) 220 MG tablet Take 220 mg by mouth daily as needed.     [provider]  Olopatadine HCl 0.2 % SOLN Apply to eye 2 (two) times daily as needed.    [provider]  Omega-3 Fatty Acids (FISH OIL) 1000 MG CAPS Take 1,000 mg by mouth at bedtime.     [provider]  polyethylene glycol (MIRALAX / GLYCOLAX) 17 g packet Take 17 g by mouth daily as needed.    [provider]  pravastatin (PRAVACHOL) 40 MG tablet Take 1 tablet (40 mg total) by mouth daily. 09/25/21   Lorretta Harp, MD  senna (SENOKOT) 8.6 MG tablet Take 1 tablet by mouth daily.     [provider]  sodium chloride (OCEAN) 0.65 % SOLN nasal spray Place 1 spray into both nostrils as needed for congestion. Patient taking differently: Place 1 spray into both nostrils 4 (four) times daily as needed for congestion. 03/18/18   Love, Ivan Anchors, PA-C  tacrolimus (PROTOPIC) 0.1 % ointment Apply 1 application topically 2 (two) times daily as needed (PRN skin issues). Patient taking differently: Apply 1 application topically See admin instructions. Apply topically twice daily for 5 days alternating with Fluocinonide ointment 03/18/18   Love, Ivan Anchors, PA-C  Thiamine HCl (VITAMIN B-1) 100 MG tablet Take 100 mg by mouth daily.    [provider]  triamcinolone ointment (KENALOG) 0.5 % Apply 1 application topically 2 (two) times daily. For rash on left lower leg 06/13/20   Binnie Rail, MD      Allergies    Mold extract [trichophyton mentagrophyte], Penicillins, Morphine, Peanut oil, Protonix [pantoprazole sodium], Atorvastatin, Citrus, Peanut-containing drug products, Rosuvastatin, Tramadol, Valium [diazepam], Cetirizine, Codeine, Eggs or egg-derived products, Latex, and Pentazocine lactate    Review of Systems   Review of Systems  Constitutional:  Negative for chills and fever.  HENT:  Negative for sore throat and trouble swallowing.   Eyes:  Negative for pain and visual disturbance.  Respiratory:  Negative for cough and shortness of breath.   Cardiovascular:  Negative for chest pain.  Gastrointestinal:  Negative for abdominal pain, diarrhea and vomiting.  Genitourinary:  Negative for dysuria, flank pain and frequency.  Musculoskeletal:  Negative for back pain and neck pain.  Skin:  Negative for rash.  Neurological:  Positive  for dizziness. Negative for speech difficulty and headaches.  Hematological:  Does not bruise/bleed easily.  Psychiatric/Behavioral:  Negative for confusion.    Physical Exam Updated Vital Signs BP (!) 137/96 (BP Location: Right Arm)    Pulse 86    Temp 99.2 F (37.3 C) (Oral)    Resp 15    SpO2 96%  Physical Exam Vitals and nursing note reviewed.  Constitutional:      Appearance: Normal appearance. She is well-developed.  HENT:     Head: Atraumatic.     Nose: Nose normal.     Mouth/Throat:     Mouth: Mucous membranes are moist.  Eyes:     General: No scleral icterus.    Extraocular Movements: Extraocular movements intact.     Conjunctiva/sclera: Conjunctivae normal.     Pupils: Pupils are equal, round, and reactive to light.  Neck:     Vascular: No carotid bruit.     Trachea: No tracheal deviation.  Cardiovascular:     Rate and Rhythm: Normal rate and regular rhythm.      Pulses: Normal pulses.     Heart sounds: Normal heart sounds. No murmur heard.   No friction rub. No gallop.  Pulmonary:     Effort: Pulmonary effort is normal. No respiratory distress.     Breath sounds: Normal breath sounds.  Abdominal:     General: Bowel sounds are normal. There is no distension.     Palpations: Abdomen is soft. There is no mass.     Tenderness: There is no abdominal tenderness.  Genitourinary:    Comments: No cva tenderness.  Musculoskeletal:        General: No swelling or tenderness.     Cervical back: Normal range of motion and neck supple. No rigidity. No muscular tenderness.     Comments: CTLS spine, non tender, aligned, no step off.   Skin:    General: Skin is warm and dry.     Findings: No rash.  Neurological:     Mental Status: She is alert.     Comments: Alert, speech normal. No gross dysarthria or aphasia. Motor fxn grossly intact bil, stre 5/5. No pronator drift. Sens grossly intact. Pt w very slow, wide-based gait, with halting quality. Patient does not fall to right or left.   Psychiatric:        Mood and Affect: Mood normal.    ED Results / Procedures / Treatments   Labs (all labs ordered are listed, but only abnormal results are displayed) Results for orders placed or performed during the hospital encounter of 94/85/46  Basic metabolic panel  Result Value Ref Range   Sodium 143 135 - 145 mmol/L   Potassium 3.9 3.5 - 5.1 mmol/L   Chloride 110 98 - 111 mmol/L   CO2 23 22 - 32 mmol/L   Glucose, Bld 105 (H) 70 - 99 mg/dL   BUN 17 8 - 23 mg/dL   Creatinine, Ser 0.84 0.44 - 1.00 mg/dL   Calcium 9.8 8.9 - 10.3 mg/dL   GFR, Estimated >60 >60 mL/min   Anion gap 10 5 - 15  CBC  Result Value Ref Range   WBC 5.9 4.0 - 10.5 K/uL   RBC 4.57 3.87 - 5.11 MIL/uL   Hemoglobin 13.6 12.0 - 15.0 g/dL   HCT 40.5 36.0 - 46.0 %   MCV 88.6 80.0 - 100.0 fL   MCH 29.8 26.0 - 34.0 pg   MCHC 33.6 30.0 - 36.0 g/dL  RDW 12.9 11.5 - 15.5 %   Platelets 251 150  - 400 K/uL   nRBC 0.0 0.0 - 0.2 %  Urinalysis, Routine w reflex microscopic  Result Value Ref Range   Color, Urine YELLOW YELLOW   APPearance CLEAR CLEAR   Specific Gravity, Urine 1.010 1.005 - 1.030   pH 5.5 5.0 - 8.0   Glucose, UA NEGATIVE NEGATIVE mg/dL   Hgb urine dipstick SMALL (A) NEGATIVE   Bilirubin Urine NEGATIVE NEGATIVE   Ketones, ur NEGATIVE NEGATIVE mg/dL   Protein, ur NEGATIVE NEGATIVE mg/dL   Nitrite NEGATIVE NEGATIVE   Leukocytes,Ua TRACE (A) NEGATIVE  Urinalysis, Microscopic (reflex)  Result Value Ref Range   RBC / HPF 0-5 0 - 5 RBC/hpf   WBC, UA 6-10 0 - 5 WBC/hpf   Bacteria, UA FEW (A) NONE SEEN   Squamous Epithelial / LPF 0-5 0 - 5   Mucus PRESENT      EKG EKG Interpretation  Date/Time:  Tuesday December 05 2021 13:38:26 EST Ventricular Rate:  95 PR Interval:  136 QRS Duration: 88 QT Interval:  320 QTC Calculation: 402 R Axis:   47 Text Interpretation: Normal sinus rhythm Nonspecific T wave abnormality No significant change since last tracing Confirmed by Lajean Saver [phone]) on 12/05/2021 4:08:48 PM  Radiology MR THORACIC SPINE WO CONTRAST  Result Date: 12/05/2021 CLINICAL DATA:  Initial evaluation for chronic myelopathy. EXAM: MRI THORACIC AND LUMBAR SPINE WITHOUT CONTRAST TECHNIQUE: Multiplanar and multiecho pulse sequences of the thoracic and lumbar spine were obtained without intravenous contrast. COMPARISON:  Prior radiograph from 08/24/2016. FINDINGS: MRI THORACIC SPINE FINDINGS Alignment: Dextroscoliosis with exaggeration of the normal thoracic kyphosis. No listhesis. Vertebrae: Susceptibility artifact related to prior posterior fusion at T12 extending through the visualized upper lumbar spine. Vertebral body height maintained without acute or chronic fracture. Bone marrow signal intensity within normal limits. No discrete or worrisome osseous lesions or abnormal marrow edema. Cord: Normal signal and morphology. No cord signal changes to suggest  myelopathy. Paraspinal and other soft tissues: Paraspinous soft tissues within normal limits. Visualized visceral structures unremarkable. Disc levels: T5-6: Tiny left paracentral disc protrusion minimally flattens the left ventral thecal sac (series 23, image 17). Minimal flattening of the left ventral cord without cord signal changes or significant spinal stenosis. Foramina remain patent. T6-7: Shallow left paracentral disc protrusion mildly flattens the left ventral thecal sac (series 23, image 20). Minimal flattening of the left ventral cord without cord signal changes or significant spinal stenosis. Foramina remain patent. T7-8: Tiny right paracentral disc protrusion minimally indents the right ventral thecal sac (series 23, image 23). Minimal flattening of the right ventral cord without cord signal changes or significant spinal stenosis. Foramina remain patent. Otherwise, no other significant disc pathology seen elsewhere within the thoracic spine. No significant spinal stenosis. Foramina remain patent. No impingement. MRI LUMBAR SPINE FINDINGS Segmentation: Standard. Lowest well-formed disc space labeled the L5-S1 level. Alignment: Moderate levoscoliosis of the lumbar spine. 5 mm anterolisthesis of L4 on L5, with 3 mm retrolisthesis of L5 on S1. Straightening of the normal lumbar lordosis. Vertebrae: Susceptibility artifact related to prior posterior fusion at T12 through L5, with interbody fusion at L1-2 through L4-5. Vertebral body height maintained without acute or chronic fracture. Bone marrow signal intensity within normal limits. No worrisome osseous lesions. Prominent discogenic reactive endplate change with associated marrow edema present about the L5-S1 interspace. No other abnormal marrow edema. Conus medullaris and cauda equina: Conus extends to the L2 level. Conus and cauda  equina appear normal. Paraspinal and other soft tissues: Chronic postoperative scarring present throughout the posterior  paraspinous soft tissues. Paraspinous soft tissues demonstrate no acute finding. Multiple scattered benign appearing T2 hyperintense cyst noted about the kidneys bilaterally. Visualized visceral structures otherwise unremarkable. Disc levels: L1-2: Prior PLIF. No residual spinal stenosis. Foramina remain while the patent. L2-3: Prior PLIF. No residual or recurrent spinal stenosis. Foramina are widely patent. L3-4: Prior PLIF. No residual or recurrent spinal stenosis. Foramina remain widely patent. L4-5: Chronic 5 mm anterolisthesis. Prior PLIF. No residual or recurrent spinal stenosis. Foramina remain patent. L5-S1: 3 mm retrolisthesis. Degenerative intervertebral disc space narrowing with disc desiccation and diffuse disc bulge. Associated discogenic reactive endplate change with marginal endplate osteophytic spurring and marrow edema. Superimposed left extraforaminal disc osteophyte complex (series 28, image 40). Mild to moderate bilateral facet hypertrophy. Resultant mild canal with bilateral subarticular stenosis. Mild left greater than right L5 foraminal narrowing. No frank impingement. IMPRESSION: MR THORACIC SPINE IMPRESSION: 1. No acute abnormality within the thoracic spine. Normal MRI appearance of the thoracic spinal cord. No cord signal changes to suggest myelopathy. 2. Small disc protrusions at T5-6, T6-7, and T7-8 as above without significant stenosis or impingement. 3. Dextroscoliosis. MR LUMBAR SPINE IMPRESSION: 1. No acute abnormality within the lumbar spine. Normal MRI appearance of the conus medullaris and cauda equina. 2. Prior PLIF at L1-2 through L4-5 without residual or recurrent stenosis. 3. 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 No frank impingement. 4. Prominent discogenic reactive endplate change with associated marrow edema about the L5-S1 interspace. Finding could contribute to  underlying back pain. Electronically Signed   By: Jeannine Boga M.D.   On: 12/05/2021 20:55   MR LUMBAR SPINE WO CONTRAST  Result Date: 12/05/2021 CLINICAL DATA:  Initial evaluation for chronic myelopathy. EXAM: MRI THORACIC AND LUMBAR SPINE WITHOUT CONTRAST TECHNIQUE: Multiplanar and multiecho pulse sequences of the thoracic and lumbar spine were obtained without intravenous contrast. COMPARISON:  Prior radiograph from 08/24/2016. FINDINGS: MRI THORACIC SPINE FINDINGS Alignment: Dextroscoliosis with exaggeration of the normal thoracic kyphosis. No listhesis. Vertebrae: Susceptibility artifact related to prior posterior fusion at T12 extending through the visualized upper lumbar spine. Vertebral body height maintained without acute or chronic fracture. Bone marrow signal intensity within normal limits. No discrete or worrisome osseous lesions or abnormal marrow edema. Cord: Normal signal and morphology. No cord signal changes to suggest myelopathy. Paraspinal and other soft tissues: Paraspinous soft tissues within normal limits. Visualized visceral structures unremarkable. Disc levels: T5-6: Tiny left paracentral disc protrusion minimally flattens the left ventral thecal sac (series 23, image 17). Minimal flattening of the left ventral cord without cord signal changes or significant spinal stenosis. Foramina remain patent. T6-7: Shallow left paracentral disc protrusion mildly flattens the left ventral thecal sac (series 23, image 20). Minimal flattening of the left ventral cord without cord signal changes or significant spinal stenosis. Foramina remain patent. T7-8: Tiny right paracentral disc protrusion minimally indents the right ventral thecal sac (series 23, image 23). Minimal flattening of the right ventral cord without cord signal changes or significant spinal stenosis. Foramina remain patent. Otherwise, no other significant disc pathology seen elsewhere within the thoracic spine. No significant  spinal stenosis. Foramina remain patent. No impingement. MRI LUMBAR SPINE FINDINGS Segmentation: Standard. Lowest well-formed disc space labeled the L5-S1 level. Alignment: Moderate levoscoliosis of the lumbar spine. 5 mm anterolisthesis of L4 on L5, with 3 mm retrolisthesis of  L5 on S1. Straightening of the normal lumbar lordosis. Vertebrae: Susceptibility artifact related to prior posterior fusion at T12 through L5, with interbody fusion at L1-2 through L4-5. Vertebral body height maintained without acute or chronic fracture. Bone marrow signal intensity within normal limits. No worrisome osseous lesions. Prominent discogenic reactive endplate change with associated marrow edema present about the L5-S1 interspace. No other abnormal marrow edema. Conus medullaris and cauda equina: Conus extends to the L2 level. Conus and cauda equina appear normal. Paraspinal and other soft tissues: Chronic postoperative scarring present throughout the posterior paraspinous soft tissues. Paraspinous soft tissues demonstrate no acute finding. Multiple scattered benign appearing T2 hyperintense cyst noted about the kidneys bilaterally. Visualized visceral structures otherwise unremarkable. Disc levels: L1-2: Prior PLIF. No residual spinal stenosis. Foramina remain while the patent. L2-3: Prior PLIF. No residual or recurrent spinal stenosis. Foramina are widely patent. L3-4: Prior PLIF. No residual or recurrent spinal stenosis. Foramina remain widely patent. L4-5: Chronic 5 mm anterolisthesis. Prior PLIF. No residual or recurrent spinal stenosis. Foramina remain patent. L5-S1: 3 mm retrolisthesis. Degenerative intervertebral disc space narrowing with disc desiccation and diffuse disc bulge. Associated discogenic reactive endplate change with marginal endplate osteophytic spurring and marrow edema. Superimposed left extraforaminal disc osteophyte complex (series 28, image 40). Mild to moderate bilateral facet hypertrophy. Resultant  mild canal with bilateral subarticular stenosis. Mild left greater than right L5 foraminal narrowing. No frank impingement. IMPRESSION: MR THORACIC SPINE IMPRESSION: 1. No acute abnormality within the thoracic spine. Normal MRI appearance of the thoracic spinal cord. No cord signal changes to suggest myelopathy. 2. Small disc protrusions at T5-6, T6-7, and T7-8 as above without significant stenosis or impingement. 3. Dextroscoliosis. MR LUMBAR SPINE IMPRESSION: 1. No acute abnormality within the lumbar spine. Normal MRI appearance of the conus medullaris and cauda equina. 2. Prior PLIF at L1-2 through L4-5 without residual or recurrent stenosis. 3. 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 No frank impingement. 4. Prominent discogenic reactive endplate change with associated marrow edema about the L5-S1 interspace. Finding could contribute to underlying back pain. Electronically Signed   By: Jeannine Boga M.D.   On: 12/05/2021 20:55    Procedures Procedures    Medications Ordered in ED Medications - No data to display  ED Course/ Medical Decision Making/ A&P                           Medical Decision Making Problems Addressed: Generalized weakness: chronic illness or injury with exacerbation, progression, or side effects of treatment Progressive gait disorder: chronic illness or injury with exacerbation, progression, or side effects of treatment that poses a threat to life or bodily functions Tremor: chronic illness or injury  Amount and/or Complexity of Data Reviewed Independent Historian: parent and spouse External Data Reviewed: labs, radiology and notes. Labs: ordered. Decision-making details documented in ED Course. Radiology: ordered and independent interpretation performed. Decision-making details documented in ED Course. ECG/medicine tests: ordered and independent  interpretation performed. Decision-making details documented in ED Course.  Risk Prescription drug management. Drug therapy requiring intensive monitoring for toxicity. Decision regarding hospitalization.   Labs sent. Reviewed nursing notes and prior charts for additional history.  Iv ns.  Considered need for possible inpatient workup/admission, vs ED testing and outpatient management.   Reviewed nursing notes and prior charts for additional history. External reports reviewed. Pt w several prior neurology evals for similar  symptoms. Prior mri brain neg for cva, and prior c spine mri neg for acute cervical cord process. Has seen pcp and neurosurgery as well.   Cardiac monitor - nsr rate 94.   Labs reviewed/interpreted by me -  nitrite neg, le trace. 6 wbc. Pt denies fever or chills. Denies dysuria. Will culture.  Additional labs reviewed/interpreted by me - wbc and chem normal.   Ativan .5 mg iv prior to CT.   MR imaging reviewed/interpreted by me - no acute cord impingement or severe spinal stenosis. Degenerative changes noted.   Patient currently appears stable for d/c. Suspect slow/progression of chronic processes - rec close outpatient pcp and neurology f/u.  Return precautions provided.             Final Clinical Impression(s) / ED Diagnoses Final diagnoses:  None    Rx / DC Orders ED Discharge Orders     None         Lajean Saver, MD 12/05/21 2115

## 2021-12-06 ENCOUNTER — Telehealth: Payer: Self-pay | Admitting: Internal Medicine

## 2021-12-06 ENCOUNTER — Telehealth: Payer: Self-pay | Admitting: Neurology

## 2021-12-06 NOTE — Telephone Encounter (Signed)
Patient seen in ED on yesterday.

## 2021-12-06 NOTE — Telephone Encounter (Signed)
Connected to [facility].   Caller states she developed increased dizziness about a week ago. No known injury to her head or ears. No sever breathing difficulty or blueness around her lips. Alert and responsive.    Advised to go to ED

## 2021-12-06 NOTE — Telephone Encounter (Signed)
Patient was in ER yesterday, she said she cant walk. She was told to be seen within a week. She would like to know if there is anything that can be done. I saw where she sees Tat but last saw patel in 2022 per tat

## 2021-12-07 NOTE — Telephone Encounter (Signed)
Pt stated that she can not get to [facility] she said that they didn't not tell her anything different and she said that if she can not see Dr Tat can we send a referral to another local Neurologist

## 2021-12-07 NOTE — Telephone Encounter (Signed)
Pt transferred to the front to get scheduled with Dr Tat. She was thankful we could see her even after telling her that we are unsure of the next steps,

## 2021-12-07 NOTE — Telephone Encounter (Signed)
Pt called left a voice mail to call the office back

## 2021-12-08 LAB — URINE CULTURE: Culture: >=100000 — AB

## 2021-12-09 NOTE — Telephone Encounter (Signed)
Post ED Visit - Positive Culture Follow-up  Culture report reviewed by antimicrobial stewardship pharmacist: [place] Team []  Elenor Quinones, Pharm.D. []  Heide Guile, Pharm.D., BCPS AQ-ID []  Parks Neptune, Pharm.D., BCPS []  Alycia Rossetti, Pharm.D., BCPS []  [place], Pharm.D., BCPS, AAHIVP []  Legrand Como, Pharm.D., BCPS, AAHIVP []  Salome Arnt, PharmD, BCPS []  Johnnette Gourd, PharmD, BCPS []  Hughes Better, PharmD, BCPS []  Leeroy Cha, PharmD []  Laqueta Linden, PharmD, BCPS [x]  Myra Rude, PharmD  [place] Team []  Leodis Sias, PharmD []  Lindell Spar, PharmD []  Royetta Asal, PharmD []  Graylin Shiver, Rph []  Rema Fendt) Glennon Mac, PharmD []  Arlyn Dunning, PharmD []  Netta Cedars, PharmD []  Dia Sitter, PharmD []  Leone Haven, PharmD []  Gretta Arab, PharmD []  Theodis Shove, PharmD []  Peggyann Juba, PharmD []  Reuel Boom, PharmD   Positive urine culture no further patient follow-up is required at this time.  Rosie Fate 12/09/2021, 11:13 AM

## 2021-12-15 ENCOUNTER — Telehealth: Payer: Self-pay | Admitting: Neurology

## 2021-12-15 NOTE — Telephone Encounter (Signed)
Called aptient

## 2021-12-15 NOTE — Telephone Encounter (Signed)
I was looking over past notes on this patient and wasn't sure on next steps. Patient does have two MRI in the system but have not been resulted in our notes

## 2021-12-15 NOTE — Telephone Encounter (Signed)
Called patient and reccommended reaching out to Dr. Trenton Gammon regarding MRI and to reach out to [facility] for shakiness I have given patient that number. Patient agreed and is going to start there and let us know what they say

## 2021-12-15 NOTE — Telephone Encounter (Signed)
Patient would like to know if there is anything that she can be put on for her shakiness. She said she cant walk or get around. She cant seem to take anything without a reaction to the medication. She stated she got a MRI and she would like to know if Tat got the results for that.

## 2021-12-28 DIAGNOSIS — M47816 Spondylosis without myelopathy or radiculopathy, lumbar region: Secondary | ICD-10-CM | POA: Diagnosis not present
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
## 2021-12-29 ENCOUNTER — Encounter: Payer: Self-pay | Admitting: Neurology

## 2021-12-29 ENCOUNTER — Other Ambulatory Visit: Payer: Self-pay

## 2021-12-29 ENCOUNTER — Other Ambulatory Visit (INDEPENDENT_AMBULATORY_CARE_PROVIDER_SITE_OTHER): Payer: PPO

## 2021-12-29 ENCOUNTER — Ambulatory Visit (INDEPENDENT_AMBULATORY_CARE_PROVIDER_SITE_OTHER): Payer: PPO | Admitting: Neurology

## 2021-12-29 VITALS — BP 122/79 | HR 71 | Ht 64.0 in | Wt 143.2 lb

## 2021-12-29 DIAGNOSIS — H532 Diplopia: Secondary | ICD-10-CM | POA: Diagnosis not present

## 2021-12-29 DIAGNOSIS — R270 Ataxia, unspecified: Secondary | ICD-10-CM | POA: Diagnosis not present

## 2021-12-29 DIAGNOSIS — G629 Polyneuropathy, unspecified: Secondary | ICD-10-CM

## 2021-12-29 DIAGNOSIS — G253 Myoclonus: Secondary | ICD-10-CM | POA: Diagnosis not present

## 2021-12-29 MED ORDER — CARBIDOPA-LEVODOPA 25-100 MG PO TABS: 1.0000 | ORAL_TABLET | Freq: Three times a day (TID) | ORAL | 5 refills | Status: DC

## 2021-12-29 NOTE — Patient Instructions (Addendum)
Start Carbidopa Levodopa as follows: Take 1/2 tablet three times daily, at least 30 minutes before meals (approximately 9am/1pm/5pm), for one week Then take 1/2 tablet in the morning, 1/2 tablet in the afternoon, 1 tablet in the evening, at least 30 minutes before meals, for one week Then take 1/2 tablet in the morning, 1 tablet in the afternoon, 1 tablet in the evening, at least 30 minutes before meals, for one week Then take 1 tablet three times daily at 9am/1pm/5pm, at least 30 minutes before meals   As a reminder, carbidopa/levodopa can be taken at the same time as a carbohydrate, but we like to have you take your pill either 30 minutes before a protein source or 1 hour after as protein can interfere with carbidopa/levodopa absorption.  \ Your provider has requested that you have labwork completed today. The lab is located on the Second floor at [facility], within the [facility] office. When you get off the elevator, turn right and go in the [facility] [address]; the first brown door on the left.  Tell the ladies behind the desk that you are there for lab work. If you are not called within 15 minutes please check with the front desk.   Once you complete your labs you are free to go. You will receive a call or message via MyChart with your lab results.

## 2022-01-10 LAB — MYASTHENIA GRAVIS PANEL 1
A CHR BINDING ABS: <0.3 nmol/L
STRIATED MUSCLE AB SCREEN: NEGATIVE

## 2022-01-18 DIAGNOSIS — M47816 Spondylosis without myelopathy or radiculopathy, lumbar region: Secondary | ICD-10-CM | POA: Diagnosis not present

## 2022-01-23 ENCOUNTER — Other Ambulatory Visit: Payer: Self-pay

## 2022-01-23 ENCOUNTER — Ambulatory Visit: Payer: PPO | Admitting: Neurology

## 2022-01-23 DIAGNOSIS — G629 Polyneuropathy, unspecified: Secondary | ICD-10-CM

## 2022-01-23 DIAGNOSIS — R2681 Unsteadiness on feet: Secondary | ICD-10-CM

## 2022-01-23 NOTE — Procedures (Signed)
[facility]  ?[address] ? [place],  [address] ?Tel: [phone] ?Fax:  [phone] ?Test Date:  01/23/2022 ? ?Patient: Kara Ramirez DOB: 1951-01-26 Physician: Narda Amber, DO  ?Sex: Female Height: '5\' 4"'$  Ref Phys: Wells Guiles Tat, D.O.  ?ID#: [id number]   Technician:   ? ?Patient Complaints: ?This is a 71 year old female referred for evaluation of gait unsteadiness. ? ?NCV & EMG Findings: ?Extensive electrodiagnostic testing of the right lower extremity and additional studies of the left shows:  ?Bilateral sural and superficial peroneal sensory responses are within normal limits. ?Left peroneal motor response shows reduced amplitude at the extensor digitorum brevis, and is normal at the tibialis anterior.  Right peroneal and bilateral tibial motor responses are within normal limits. ?Bilateral tibial H reflex studies are within normal limits. ?There is no evidence of active or chronic motor axonal loss changes affecting any of the tested muscles.  Motor unit configuration and recruitment pattern is within normal limits. ? ?Impression: ?This is a normal study of the lower extremities.  In particular, there is no evidence of a large fiber sensorimotor polyneuropathy or lumbosacral radiculopathy. ? ? ?___________________________ ?Narda Amber, DO ? ? ? ?Nerve Conduction Studies ?Anti Sensory Summary Table ? ? Stim Site NR Peak (ms) Norm Peak (ms) P-T Amp (?V) Norm P-T Amp  ?Left Sup Peroneal Anti Sensory (Ant Lat Mall)  32?C  ?12 cm    2.5 <4.6 9.4 >3  ?Right Sup Peroneal Anti Sensory (Ant Lat Mall)  32?C  ?12 cm    2.7 <4.6 10.1 >3  ?Left Sural Anti Sensory (Lat Mall)  32?C  ?Calf    2.8 <4.6 13.5 >3  ?Right Sural Anti Sensory (Lat Mall)  32?C  ?Calf    3.2 <4.6 9.9 >3  ? ?Motor Summary Table ? ? Stim Site NR Onset (ms) Norm Onset (ms) O-P Amp (mV) Norm O-P Amp Site1 Site2 Delta-0 (ms) Dist (cm) Vel (m/s) Norm Vel (m/s)  ?Left Peroneal Motor (Ext Dig Brev)  32?C  ?Ankle    3.9 <6.0 1.6  >2.5 B Fib Ankle 8.5 36.0 42 >40  ?B Fib    12.4  1.3  Poplt B Fib 1.7 7.0 41 >40  ?Poplt    14.1  0.9         ?Right Peroneal Motor (Ext Dig Brev)  32?C  ?Ankle    3.7 <6.0 3.6 >2.5 B Fib Ankle 6.5 37.0 57 >40  ?B Fib    10.2  3.4  Poplt B Fib 1.4 7.0 50 >40  ?Poplt    11.6  3.4         ?Left Peroneal TA Motor (Tib Ant)  32?C  ?Fib Head    2.8 <4.5 3.2 >3 Poplit Fib Head 1.3 7.0 54 >40  ?Poplit    4.1  2.9         ?Left Tibial Motor ([place]d [place] Crane Brev)  32?C  ?Ankle    5.0 <6.0 10.0 >4 Knee Ankle 7.8 39.0 50 >40  ?Knee    12.8  7.5         ?Right Tibial Motor (Abd [place] Crane Brev)  32?C  ?Ankle    3.0 <6.0 8.3 >4 Knee Ankle 8.2 41.0 50 >40  ?Knee    11.2  6.7         ? ?H Reflex Studies ? ? NR H-Lat (ms) Lat Norm (ms) L-R H-Lat (ms)  ?Left Tibial (Gastroc)  32?C  ?   31.70 <35 0.82  ?  Right Tibial (Gastroc)  32?C  ?   30.88 <35 0.82  ? ?EMG ? ? Side Muscle Ins Act Fibs Psw Fasc Number Recrt Dur Dur. Amp Amp. Poly Poly. Comment  ?Right AntTibialis Nml Nml Nml Nml Nml Nml Nml Nml Nml Nml Nml Nml N/A  ?Right Gastroc Nml Nml Nml Nml Nml Nml Nml Nml Nml Nml Nml Nml N/A  ?Right Flex Dig Long Nml Nml Nml Nml Nml Nml Nml Nml Nml Nml Nml Nml N/A  ?Right RectFemoris Nml Nml Nml Nml Nml Nml Nml Nml Nml Nml Nml Nml N/A  ?Right BicepsFemS Nml Nml Nml Nml Nml Nml Nml Nml Nml Nml Nml Nml N/A  ?Left BicepsFemS Nml Nml Nml Nml Nml Nml Nml Nml Nml Nml Nml Nml N/A  ?Left AntTibialis Nml Nml Nml Nml Nml Nml Nml Nml Nml Nml Nml Nml N/A  ?Left Gastroc Nml Nml Nml Nml Nml Nml Nml Nml Nml Nml Nml Nml N/A  ?Left RectFemoris Nml Nml Nml Nml Nml Nml Nml Nml Nml Nml Nml Nml N/A  ? ? ? ? ?Waveforms: ?    ? ?    ? ?    ? ?   ? ? ?

## 2022-01-23 NOTE — Progress Notes (Signed)
Emg is normal.  Don't see pt went to 01/19/22 [facility] appt?

## 2022-02-06 DIAGNOSIS — H01009 Unspecified blepharitis unspecified eye, unspecified eyelid: Secondary | ICD-10-CM | POA: Diagnosis not present

## 2022-02-06 DIAGNOSIS — H4912 Fourth [trochlear] nerve palsy, left eye: Secondary | ICD-10-CM | POA: Diagnosis not present

## 2022-02-06 DIAGNOSIS — S069X9A Unspecified intracranial injury with loss of consciousness of unspecified duration, initial encounter: Secondary | ICD-10-CM | POA: Diagnosis not present

## 2022-02-09 DIAGNOSIS — M47816 Spondylosis without myelopathy or radiculopathy, lumbar region: Secondary | ICD-10-CM | POA: Diagnosis not present

## 2022-02-22 ENCOUNTER — Other Ambulatory Visit: Payer: Self-pay | Admitting: Allergy & Immunology

## 2022-03-01 ENCOUNTER — Telehealth: Payer: Self-pay | Admitting: Neurology

## 2022-03-01 NOTE — Telephone Encounter (Signed)
Wants to know if there is something she can do or take to keep her from shaking. ?

## 2022-03-01 NOTE — Telephone Encounter (Signed)
Called aptietn back she is currently taking carbidopa levodopa 1 pill three times a day. She feels like she is not getting any better and her shaking ( tremors ) have gotten worse. Patient said her voice has gotten so bad it is shaking and difficult for her talk ?

## 2022-03-05 NOTE — Telephone Encounter (Signed)
Patient did not attend appointment in March she was not able to get there. Patient frustrated about her shaking and feels [facility] has done nothing to help her  ?

## 2022-03-13 ENCOUNTER — Telehealth: Payer: Self-pay | Admitting: Allergy

## 2022-03-13 NOTE — Telephone Encounter (Signed)
Called and spoke to [facility] and notified her she needs a follow up visit, I scheduled her for a televisit with Dr. Nelva Bush tomorrow 03/14/22 at 9:30am and informed patient of the time and date. ? ?[place] [phone] ?

## 2022-03-13 NOTE — Telephone Encounter (Signed)
Kara Ramirez called in and states she has had a sinus infection for over a week and a half now.  She states that she is coughing, sneezing, having headaches, and scratchy throat.  Patient denies fever and body aches.  Patient stated when she coughs and blows her nose she does expel mucus.  Patient would like some medication called in to [facility] on [address]  ?

## 2022-03-14 ENCOUNTER — Encounter: Payer: Self-pay | Admitting: Allergy

## 2022-03-14 ENCOUNTER — Ambulatory Visit (INDEPENDENT_AMBULATORY_CARE_PROVIDER_SITE_OTHER): Payer: PPO | Admitting: Allergy

## 2022-03-14 VITALS — Wt 125.0 lb

## 2022-03-14 DIAGNOSIS — J329 Chronic sinusitis, unspecified: Secondary | ICD-10-CM

## 2022-03-14 DIAGNOSIS — J3089 Other allergic rhinitis: Secondary | ICD-10-CM

## 2022-03-14 DIAGNOSIS — J4530 Mild persistent asthma, uncomplicated: Secondary | ICD-10-CM

## 2022-03-14 DIAGNOSIS — J0181 Other acute recurrent sinusitis: Secondary | ICD-10-CM

## 2022-03-14 DIAGNOSIS — J302 Other seasonal allergic rhinitis: Secondary | ICD-10-CM

## 2022-03-14 MED ORDER — DOXYCYCLINE MONOHYDRATE 100 MG PO CAPS: 100.0000 mg | ORAL_CAPSULE | Freq: Two times a day (BID) | ORAL | 0 refills | Status: DC

## 2022-03-14 MED ORDER — BREZTRI AEROSPHERE 160-9-4.8 MCG/ACT IN AERO: 2.0000 | INHALATION_SPRAY | Freq: Two times a day (BID) | RESPIRATORY_TRACT | 5 refills | Status: DC

## 2022-03-14 MED ORDER — PREDNISONE 10 MG PO TABS: ORAL_TABLET | ORAL | 0 refills | Status: DC

## 2022-03-14 NOTE — Patient Instructions (Addendum)
Acute sinusitis: ?2 weeks of ongoing symptoms with nasal congestion, drainage and respiratory symptoms.  She gets these infections quite frequently ?She had an immunocompetence workup in 2020 that was reassuring and showed good protective antibody titers as well as normal immunoglobulin levels ? For current symptoms will treat with Doxycyline 136mg 1 tab twice a day x 7 days and prednisone 5 day burst ?Monitor for fever. Maintain adequate hydration.   ? ?Asthma: ?Daily controller medication(s):  Breztri 1648m 2 puffs twice a day with spacer and rinse mouth afterwards.  If not covered with insurance will provide with samples ?May use albuterol rescue inhaler 2 puffs every 4 to 6 hours as needed for shortness of breath, chest tightness, coughing, and wheezing. May use albuterol rescue inhaler 2 puffs 5 to 15 minutes prior to strenuous physical activities. Monitor frequency of use.  ?Asthma control goals:  ?Full participation in all desired activities (may need albuterol before activity) ?Albuterol use two times or less a week on average (not counting use with activity) ?Cough interfering with sleep two times or less a month ?Oral steroids no more than once a year ?No hospitalizations ? ?Allergic rhinitis: ?Use Xhance 2 sprays per nostril twice a day to help with sinus congestion control (this can help improve dizziness if related to sinus congestion) ?Use nasal ipratropium one spray per nostril twice a day and prior to eating.  Use no more than 4 times a day.   May use nasal saline spray to help keep the nose from getting too dry ?Use Xyzal '5mg'$  daily as needed for allergy symptom control ?Continue environmental control measures. ? ?Follow up in 3-4 months or sooner if needed ? ?. ?

## 2022-03-14 NOTE — Progress Notes (Signed)
? ?RE: Kara Ramirez MRN: [id number] DOB: 05-27-1951 ?Date of Telemedicine Visit: 03/14/2022 ? ?Referring provider: Binnie Rail, MD ?Primary care provider: Binnie Rail, MD ? ?Chief Complaint: Nasal Congestion (Can't breathe through her nose, but she hasn't been able to breathe through her nose most of her life any ways she said. Having yellowish thick nasal drainage.  PND making her cough up yellow sputum. Started about a week and half ago, clearing her throat as well.not much of an appetite.) ? ? ?Telemedicine Follow Up Visit via Telephone: ?I connected with Kara Ramirez for a follow up on 03/14/22 by telephone and verified that I am speaking with the correct person using two identifiers. ?  ?I discussed the limitations, risks, security and privacy concerns of performing an evaluation and management service by telephone and the availability of in person appointments. I also discussed with the patient that there may be a patient responsible charge related to this service. The patient expressed understanding and agreed to proceed. ? ?Patient is at home.  Provider is at the office.  ?Visit start time: 09:31 ?Visit end time: 10:00 ?Insurance consent/check in FU:[id number] ?Medical consent and medical assistant/nurse: Sharyn Lull ? ?History of Present Illness: ?She is a 71 y.o. female, who is being followed for asthma and allergic rhinitis and chronic rhinosinusitis. Her previous allergy office visit was on 03/22/2021 with Dr. Nelva Ramirez.  ?This visit today is a sick visit. ?She states for the past 2 weeks she has had worsening symptoms of nasal congestion, sinus pressure and pain, nasal drainage, cough with shortness of breath.  She states today she is more hoarse.  She states she has been unable to breathe through her nose in the drainage that she is able to produce in clear from the throat is yellowish and thick.  This is causing her to have a decreased appetite.  She also is reporting fatigue.  She states over the  weekend she had low-grade fever but she does not remember temperature.  She has had similar episodes of of this sinusitis and it traditionally has responded well to doxycycline and a course of prednisone. ?She states she has been doing her regular routine medications but states she just ran out of the [place] inhaler several days ago.  She does feel like this change of this inhaler has been helpful in her breathing control.  She also has been using her Xhance device which usually helps with her congestion control but at this time does not seem to be quite effective.  She also has been using her albuterol at this time. ?She was quite appreciative of the televisit as she states it has become more difficult for her to move about as the use of her legs is much harder even since the last visit.  At her last visit she was using a cane for assistance. ? ?Assessment and Plan: ?Leeba is a 71 y.o. female with: ?  ?Acute sinusitis: ?2 weeks of ongoing symptoms with nasal congestion, drainage and respiratory symptoms.  She gets these infections quite frequently ?She had an immunocompetence workup in 2020 that was reassuring and showed good protective antibody titers as well as normal immunoglobulin levels ? For current symptoms will treat with Doxycyline 144mg 1 tab twice a day x 7 days and prednisone 5 day burst ?Monitor for fever. Maintain adequate hydration.   ? ?Asthma: ?Daily controller medication(s):  Breztri 1681m 2 puffs twice a day with spacer and rinse mouth afterwards.  If not covered with insurance will  provide with samples ?May use albuterol rescue inhaler 2 puffs every 4 to 6 hours as needed for shortness of breath, chest tightness, coughing, and wheezing. May use albuterol rescue inhaler 2 puffs 5 to 15 minutes prior to strenuous physical activities. Monitor frequency of use.  ?Asthma control goals:  ?Full participation in all desired activities (may need albuterol before activity) ?Albuterol use two times  or less a week on average (not counting use with activity) ?Cough interfering with sleep two times or less a month ?Oral steroids no more than once a year ?No hospitalizations ? ?Allergic rhinitis: ?Use Xhance 2 sprays per nostril twice a day to help with sinus congestion control (this can help improve dizziness if related to sinus congestion) ?Use nasal ipratropium one spray per nostril twice a day and prior to eating.  Use no more than 4 times a day.   May use nasal saline spray to help keep the nose from getting too dry ?Use Xyzal '5mg'$  daily as needed for allergy symptom control ?Continue environmental control measures. ? ?Follow up in 3-4 months or sooner if needed ? ?Diagnostics: ?None. ? ?Medication List:  ?Current Outpatient Medications  ?Medication Sig Dispense Refill  ? acetaminophen (TYLENOL) 325 MG tablet Take 1-2 tablets (325-650 mg total) by mouth every 4 (four) hours as needed for mild pain. (Patient taking differently: Take 325 mg by mouth every 4 (four) hours as needed for mild pain or headache.)    ? albuterol (VENTOLIN HFA) 108 (90 Base) MCG/ACT inhaler INHALE 1 OR 2 PUFFS INTO THE LUNGS EVERY 6 HOURS AS NEEDED FOR WHEEZING OR SHORTNESS OF BREATH 8.5 g 1  ? Apoaequorin (PREVAGEN PO) Take 1 tablet by mouth daily.    ? aspirin-acetaminophen-caffeine (EXCEDRIN MIGRAINE) 250-250-65 MG tablet Take 1 tablet by mouth every 6 (six) hours as needed for headache.    ? benzonatate (TESSALON PERLES) 100 MG capsule Take 1 capsule (100 mg total) by mouth 3 (three) times daily as needed. 30 capsule 0  ? Budeson-Glycopyrrol-Formoterol (BREZTRI AEROSPHERE) 160-9-4.8 MCG/ACT AERO Inhale 2 puffs into the lungs in the morning and at bedtime.    ? Budeson-Glycopyrrol-Formoterol (BREZTRI AEROSPHERE) 160-9-4.8 MCG/ACT AERO Inhale 2 puffs into the lungs in the morning and at bedtime. 10.7 g 5  ? Calcium Carbonate-Vitamin D 600-400 MG-UNIT tablet Take 1 tablet by mouth daily.    ? carbidopa-levodopa (SINEMET IR) 25-100 MG  tablet Take 1 tablet by mouth 3 (three) times daily. 9am/1pm/5pm 90 tablet 5  ? Carboxymethylcellul-Glycerin 1-0.9 % GEL Place 1 application into both eyes at bedtime.    ? cholecalciferol (VITAMIN D) 1000 UNITS tablet Take 1,000 Units by mouth daily.    ? Co-Enzyme Q-10 100 MG CAPS Take 100 mg by mouth daily.     ? doxycycline (MONODOX) 100 MG capsule Take 1 capsule (100 mg total) by mouth 2 (two) times daily. 14 capsule 0  ? Eyelid Cleansers (STERILID) FOAM Apply topically.    ? famotidine (PEPCID) 20 MG tablet TAKE ONE TABLET BY MOUTH DAILY 90 tablet 3  ? fluocinonide ointment (LIDEX) 5.40 % Apply 1 application topically See admin instructions. Apply topically twice daily for 5 days alternating with Tacrolimus ointment    ? fluticasone (FLONASE) 50 MCG/ACT nasal spray USE 1 SPRAY IN EACH NOSTRIL MID-DAY FOR CONGESTION (Patient taking differently: Place 1 spray into both nostrils daily.) 16 g 4  ? ibandronate (BONIVA) 150 MG tablet Take 150 mg by mouth every 30 (thirty) days. Take in the morning with a  full glass of water, on an empty stomach, and do not take anything else by mouth or lie down for the next 30 min.     ? ipratropium (ATROVENT) 0.03 % nasal spray Place 2 sprays into both nostrils every 4 (four) hours as needed for rhinitis. 30 mL 5  ? loratadine (CLARITIN) 10 MG tablet Take 10 mg by mouth daily.    ? MALIC ACID PO Take 1 capsule by mouth at bedtime.    ? methylcellulose (CITRUCEL) oral powder Take 1 packet by mouth daily.    ? metoprolol tartrate (LOPRESSOR) 25 MG tablet Take 0.5 tablets (12.5 mg total) by mouth 2 (two) times daily. 90 tablet 3  ? mirabegron ER (MYRBETRIQ) 50 MG TB24 tablet Take 50 mg by mouth daily as needed.    ? naproxen sodium (ALEVE) 220 MG tablet Take 220 mg by mouth daily as needed.     ? Olopatadine HCl 0.2 % SOLN Apply to eye 2 (two) times daily as needed.    ? Omega-3 Fatty Acids (FISH OIL) 1000 MG CAPS Take 1,000 mg by mouth at bedtime.     ? polyethylene glycol (MIRALAX  / GLYCOLAX) 17 g packet Take 17 g by mouth daily as needed.    ? pravastatin (PRAVACHOL) 40 MG tablet Take 1 tablet (40 mg total) by mouth daily. 90 tablet 3  ? predniSONE (DELTASONE) 10 MG tablet Take 2 tabl

## 2022-03-27 ENCOUNTER — Ambulatory Visit: Payer: PPO | Admitting: Neurology
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
## 2022-03-29 ENCOUNTER — Encounter: Payer: Self-pay | Admitting: Neurology

## 2022-03-29 ENCOUNTER — Ambulatory Visit (INDEPENDENT_AMBULATORY_CARE_PROVIDER_SITE_OTHER): Payer: PPO | Admitting: Neurology

## 2022-03-29 VITALS — BP 128/72 | HR 73 | Ht 64.0 in | Wt 125.0 lb

## 2022-03-29 DIAGNOSIS — R251 Tremor, unspecified: Secondary | ICD-10-CM

## 2022-03-29 DIAGNOSIS — R270 Ataxia, unspecified: Secondary | ICD-10-CM | POA: Diagnosis not present

## 2022-03-29 MED ORDER — CARBIDOPA-LEVODOPA 25-100 MG PO TABS: ORAL_TABLET | ORAL | 1 refills | Status: DC

## 2022-03-29 NOTE — Patient Instructions (Addendum)
Take carbidopa/levodopa 25/100, 2 at 7am, 1 at 11am, 1 at 4pm ? ?We will try to get authorization for the biopsy.  We will call you when I get that and we will decide if we want to proceed.   ? ?The physicians and staff at [facility] are committed to providing excellent care. You may receive a survey requesting feedback about your experience at our office. We strive to receive "very good" responses to the survey questions. If you feel that your experience would prevent you from giving the office a "very good " response, please contact our office to try to remedy the situation. We may be reached at [phone]. Thank you for taking the time out of your busy day to complete the survey.  ?

## 2022-04-05 ENCOUNTER — Telehealth: Payer: PPO

## 2022-04-05 NOTE — Progress Notes (Deleted)
Chronic Care Management Pharmacy Note  04/05/2022 Name:  Kara Ramirez MRN:  [id number] DOB:  June 02, 1951  Summary: -Spoke with patient, updated and confirmed current medication list -Patient reports that she is following closely with neurology about ataxia, may be referred to specialist at [facility] in the future  -Patient has no current issues or concerns with her medications   Recommendations/Changes made from today's visit: -Take medications as directed, continue to follow closely with neurology, patient will update clinic with any issues or concerns regarding her medications   Subjective: Kara Ramirez is an 71 y.o. year old female who is a primary patient of Burns, Claudina Lick, MD.  The CCM team was consulted for assistance with disease management and care coordination needs.    Engaged with patient by telephone for follow up visit in response to provider referral for pharmacy case management and/or care coordination services.   Consent to Services:  The patient was given the following information about Chronic Care Management services today, agreed to services, and gave verbal consent: 1. CCM service includes personalized support from designated clinical staff supervised by the primary care provider, including individualized plan of care and coordination with other care providers 2. 24/7 contact phone numbers for assistance for urgent and routine care needs. 3. Service will only be billed when office clinical staff spend 20 minutes or more in a month to coordinate care. 4. Only one practitioner may furnish and bill the service in a calendar month. 5.The patient may stop CCM services at any time (effective at the end of the month) by phone call to the office staff. 6. The patient will be responsible for cost sharing (co-pay) of up to 20% of the service fee (after annual deductible is met). Patient agreed to services and consent obtained.  Patient Care Team: Binnie Rail, MD as PCP -  General (Internal Medicine) Lorretta Harp, MD as PCP - Cardiology (Cardiology) Lorretta Harp, MD as Consulting Physician (Cardiology) Rana Snare, MD (Inactive) as Consulting Physician (Urology) Kennith Gain, MD as Consulting Physician (Allergy) Tat, Eustace Quail, DO as Consulting Physician (Neurology) Charlton Haws, [facility] as Pharmacist (Pharmacist)  Recent office visits: 06/14/2021 - Dr. Quay Burow - no changes - f/u in 1 year   Recent consult visits: 03/29/2022 - Dr. Carles Collet - neurology - carbidopa/levodopa 25/100, 2 at 7am, 1 at 11am, 1 at 4pm 03/14/2022 - Dr[facility] Nelva Bush [facility] - acute sinusitis - doxycycline and prednisone rx'd  12/29/2021 - Dr. Carles Collet - Neurology - start carbidopa/levodopa 25/100 - 1/2 tablet TID titrating up to 1 tablet TID  11/29/2021 - Dr. Annette Stable - [facility] - TBI - notes not available  10/31/2021 - Dr. Maudie Mercury - [facility] - cough - benzonatate rx'd  10/16/2021 - Dr. Lucile Crater - Neurology - start clonazepam 0.61m nightly - f/u in 3 months    Hospital visits: 12/05/2021 - ED visit - dizziness - no changes to medications on discharge   Objective:  Lab Results  Component Value Date   CREATININE 0.84 12/05/2021   BUN 17 12/05/2021   GFR 68.11 06/14/2021   GFRNONAA >60 12/05/2021   GFRAA >60 04/25/2020   NA 143 12/05/2021   K 3.9 12/05/2021   CALCIUM 9.8 12/05/2021   CO2 23 12/05/2021   GLUCOSE 105 (H) 12/05/2021    Lab Results  Component Value Date/Time   HGBA1C 5.9 06/14/2021 10:24 AM   HGBA1C 5.8 12/14/2019 09:42 AM   GFR 68.11 06/14/2021 10:24 AM  GFR 70.99 12/14/2019 09:42 AM    Last diabetic Eye exam:  No results found for: HMDIABEYEEXA  Last diabetic Foot exam:  No results found for: HMDIABFOOTEX   Lab Results  Component Value Date   CHOL 151 06/14/2021   HDL 60.40 06/14/2021   LDLCALC 72 06/14/2021   TRIG 93.0 06/14/2021   CHOLHDL 3 06/14/2021       Latest Ref Rng & Units 06/14/2021   10:24 AM 01/02/2021    10:56 AM 08/31/2020   10:13 AM  Hepatic Function  Total Protein 6.0 - 8.3 g/dL 7.2   7.0   7.0    Albumin 3.5 - 5.2 g/dL 4.4   4.8   4.7    AST 0 - 37 U/L 27   37   25    ALT 0 - 35 U/L 33   42   24    Alk Phosphatase 39 - 117 U/L 48   63   57    Total Bilirubin 0.2 - 1.2 mg/dL 0.6   0.5   0.4    Bilirubin, Direct 0.00 - 0.40 mg/dL  0.13   0.12      Lab Results  Component Value Date/Time   TSH 0.65 06/14/2021 10:24 AM   TSH 0.54 06/04/2019 10:04 AM       Latest Ref Rng & Units 12/05/2021    2:09 PM 06/14/2021   10:24 AM 04/25/2020    4:48 PM  CBC  WBC 4.0 - 10.5 K/uL 5.9   7.0   8.6    Hemoglobin 12.0 - 15.0 g/dL 13.6   13.3   13.9    Hematocrit 36.0 - 46.0 % 40.5   40.1   43.1    Platelets 150 - 400 K/uL 251   265.0   248      No results found for: VD25OH  Clinical ASCVD: No  The 10-year ASCVD risk score (Arnett DK, et al., 2019) is: 12.9%   Values used to calculate the score:     Age: 32 years     Sex: Female     Is Non-Hispanic African American: No     Diabetic: No     Tobacco smoker: No     Systolic Blood Pressure: 737 mmHg     Is BP treated: Yes     HDL Cholesterol: 60.4 mg/dL     Total Cholesterol: 151 mg/dL       09/02/2020   11:33 AM 12/14/2019    9:01 AM 12/12/2018   10:14 AM  Depression screen PHQ 2/9  Decreased Interest 0 0 1  Down, Depressed, Hopeless 0 0 0  PHQ - 2 Score 0 0 1  Altered sleeping   1  Tired, decreased energy   1  Change in appetite   1  Feeling bad or failure about yourself    0  Trouble concentrating   2  Moving slowly or fidgety/restless   0  Suicidal thoughts   0  PHQ-9 Score   6    Social History   Tobacco Use  Smoking Status Never  Smokeless Tobacco Never   BP Readings from Last 3 Encounters:  03/29/22 128/72  12/29/21 122/79  12/05/21 (!) 134/99   Pulse Readings from Last 3 Encounters:  03/29/22 73  12/29/21 71  12/05/21 85   Wt Readings from Last 3 Encounters:  03/29/22 125 lb (56.7 kg)  03/14/22 125 lb  (56.7 kg)  12/29/21 143 lb 3.2 oz (65 kg)  BMI Readings from Last 3 Encounters:  03/29/22 21.46 kg/m  03/14/22 21.46 kg/m  12/29/21 24.58 kg/m    Assessment/Interventions: Review of patient past medical history, allergies, medications, health status, including review of consultants reports, laboratory and other test data, was performed as part of comprehensive evaluation and provision of chronic care management services.   SDOH:  (Social Determinants of Health) assessments and interventions performed: Yes  SDOH Screenings   Alcohol Screen: Not on file  Depression (PHQ2-9): Not on file  Financial Resource Strain: Not on file  Food Insecurity: Not on file  Housing: Not on file  Physical Activity: Not on file  Social Connections: Not on file  Stress: Not on file  Tobacco Use: Low Risk    Smoking Tobacco Use: Never   Smokeless Tobacco Use: Never   Passive Exposure: Not on file  Transportation Needs: Not on file    CCM Care Plan  Allergies  Allergen Reactions   Mold Extract [Trichophyton Mentagrophyte] Shortness Of Breath and Rash   Penicillins Shortness Of Breath, Rash and Other (See Comments)    Eyes puffy Has taken low dose pcn and no rx REACTION: rash, SOB Has patient had a PCN reaction causing immediate rash, facial/tongue/throat swelling, SOB or lightheadedness with hypotension: yes Has patient had a PCN reaction causing severe rash involving mucus membranes or skin necrosis: unk Has patient had a PCN reaction that required hospitalization: no Has patient had a PCN reaction occurring within the last 10 years: unk If all of the above answers are "NO", then may proceed with Cephalospor   Morphine Other (See Comments)    REACTION: tachycardia and anxiety   Peanut Oil Nausea And Vomiting    Peanut butter   Protonix [Pantoprazole Sodium] Nausea And Vomiting   Atorvastatin     MYAGLIAS   Citrus Other (See Comments)    Unknown   Peanut-Containing Drug Products Other  (See Comments)    Unknown   Rosuvastatin     MYALGIAS   Tramadol Other (See Comments)    Makes crazy;confused   Valium [Diazepam] Other (See Comments)    Confusion per family   Cetirizine Rash and Other (See Comments)    Around face   Codeine Other (See Comments)    REACTION: dizzy and "groggy in my head"   Eggs Or Egg-Derived Products Nausea And Vomiting   Latex Itching   Pentazocine Lactate Palpitations    Medications Reviewed Today     Reviewed by Ludwig Clarks, DO (Physician) on 03/29/22 at [place] List Status: <None>   Medication Order Taking? Sig Documenting Provider Last Dose Status Informant  acetaminophen (TYLENOL) 325 MG tablet [id number] Yes Take 1-2 tablets (325-650 mg total) by mouth every 4 (four) hours as needed for mild pain.  Patient taking differently: Take 325 mg by mouth every 4 (four) hours as needed for mild pain or headache.   Bary Leriche, PA-C Taking Active   albuterol (VENTOLIN HFA) 108 (90 Base) MCG/ACT inhaler [id number] Yes INHALE 1 OR 2 PUFFS INTO THE LUNGS EVERY 6 HOURS AS NEEDED FOR WHEEZING OR SHORTNESS OF BREATH Valentina Shaggy, MD Taking Active   Apoaequorin (PREVAGEN PO) [id number] Yes Take 1 tablet by mouth daily. [provider] Taking Active   aspirin-acetaminophen-caffeine (EXCEDRIN MIGRAINE) [phone] MG tablet [id number] Yes Take 1 tablet by mouth every 6 (six) hours as needed for headache. [provider] Taking Active Self  benzonatate (TESSALON PERLES) 100 MG capsule [id number] Yes Take 1 capsule (100  mg total) by mouth 3 (three) times daily as needed. Lucretia Kern, DO Taking Active   Budeson-Glycopyrrol-Formoterol (BREZTRI AEROSPHERE) 160-9-4.8 MCG/ACT Hollie Salk [id number] Yes Inhale 2 puffs into the lungs in the morning and at bedtime. [provider] Taking Active   Budeson-Glycopyrrol-Formoterol (BREZTRI AEROSPHERE) 160-9-4.8 MCG/ACT AERO [id number] Yes Inhale 2 puffs into the lungs in the morning and at  bedtime. Kennith Gain, MD Taking Active   Calcium Carbonate-Vitamin D 600-400 MG-UNIT tablet [id number] Yes Take 1 tablet by mouth daily. [provider] Taking Active Self  carbidopa-levodopa (SINEMET IR) 25-100 MG tablet [id number] Yes 2 at 7am, 1 at 11am, 1 at 4pm Tat, Eustace Quail, DO  Active   Carboxymethylcellul-Glycerin 1-0.9 % GEL [id number] Yes Place 1 application into both eyes at bedtime. [provider] Taking Active Self  cholecalciferol (VITAMIN D) 1000 UNITS tablet [id number] Yes Take 1,000 Units by mouth daily. [provider] Taking Active Self  Co-Enzyme Q-10 100 MG CAPS [id number] Yes Take 100 mg by mouth daily.  [provider] Taking Active Self  doxycycline (MONODOX) 100 MG capsule [id number] Yes Take 1 capsule (100 mg total) by mouth 2 (two) times daily. Kennith Gain, MD Taking Active   Eyelid Cleansers [place]) [place] [id number] Yes Apply topically. [provider] Taking Active   ezetimibe (ZETIA) 10 MG tablet [id number]  Take 1 tablet (10 mg total) by mouth daily. Lorretta Harp, MD  Expired 12/24/21 2359   famotidine (PEPCID) 20 MG tablet [id number] Yes TAKE ONE TABLET BY MOUTH DAILY Armbruster, Carlota Raspberry, MD Taking Active   fluocinonide ointment (LIDEX) 0.05 % [id number] Yes Apply 1 application topically See admin instructions. Apply topically twice daily for 5 days alternating with Tacrolimus ointment [provider] Taking Active Self  fluticasone (FLONASE) 50 MCG/ACT nasal spray [id number] Yes USE 1 SPRAY IN EACH NOSTRIL MID-DAY FOR CONGESTION  Patient taking differently: Place 1 spray into both nostrils daily.   Kennith Gain, MD Taking Active   ibandronate [facility]) 150 MG tablet [id number] Yes Take 150 mg by mouth every 30 (thirty) days. Take in the morning with a full glass of water, on an empty stomach, and do not take anything else by mouth or lie down for the next 30 min.  [provider] Taking Active   ipratropium (ATROVENT) 0.03 % nasal spray [id number] Yes Place 2 sprays into both nostrils every 4 (four) hours as needed for rhinitis. Kennith Gain, MD Taking Active   loratadine (CLARITIN) 10 MG tablet [id number] Yes Take 10 mg by mouth daily. [provider] Taking Active   MALIC ACID PO [id number] Yes Take 1 capsule by mouth at bedtime. [provider] Taking Active Self  methylcellulose (CITRUCEL) oral powder [id number] Yes Take 1 packet by mouth daily. Yetta Flock, MD Taking Active   metoprolol tartrate (LOPRESSOR) 25 MG tablet [id number] Yes Take 0.5 tablets (12.5 mg total) by mouth 2 (two) times daily. Lorretta Harp, MD Taking Active   mirabegron ER [facility]) 50 MG TB24 tablet [id number] Yes Take 50 mg by mouth daily as needed. [provider] Taking Active   naproxen sodium (ALEVE) 220 MG tablet [id number] Yes Take 220 mg by mouth daily as needed.  [provider] Taking Active Self  Olopatadine HCl 0.2 % SOLN [id number] Yes Apply to eye 2 (two) times daily as needed. [provider] Taking Active   Omega-3 Fatty Acids (FISH OIL) 1000 MG CAPS [id number] Yes Take 1,000 mg by mouth  at bedtime.  [provider] Taking Active Self  polyethylene glycol (MIRALAX / GLYCOLAX) 17 g packet [id number] Yes Take 17 g by mouth daily as needed. [provider] Taking Active   pravastatin (PRAVACHOL) 40 MG tablet [id number] Yes Take 1 tablet (40 mg total) by mouth daily. Lorretta Harp, MD Taking Active   predniSONE (DELTASONE) 10 MG tablet [id number] Yes Take 2 tablet twice a day for 3 days then 2 tablets on day 4 then 1 tablet on day 5 then stop Kennith Gain, MD Taking Active   sodium chloride (OCEAN) 0.65 % SOLN nasal spray [id number] Yes Place 1 spray into both nostrils as needed for congestion.  Patient taking differently: Place 1 spray into both nostrils 4 (four) times daily as  needed for congestion.   Bary Leriche, PA-C Taking Active   tacrolimus (PROTOPIC) 0.1 % ointment [id number] Yes Apply 1 application topically 2 (two) times daily as needed (PRN skin issues).  Patient taking differently: Apply 1 application. topically See admin instructions. Apply topically twice daily for 5 days alternating with Fluocinonide ointment   Love, Ivan Anchors, PA-C Taking Active Self  Thiamine HCl (VITAMIN B-1) 100 MG tablet [id number] Yes Take 100 mg by mouth daily. [provider] Taking Active Self  triamcinolone ointment (KENALOG) 0.5 % [id number] Yes Apply 1 application topically 2 (two) times daily. For rash on left lower leg Binnie Rail, MD Taking Active             Patient Active Problem List   Diagnosis Date Noted   GERD (gastroesophageal reflux disease) 06/14/2021   Aortic atherosclerosis ([place]) 06/14/2021   Ataxia 03/06/2021   Intermittent lightheadedness 03/06/2021   Acute sinusitis 10/03/2020   Mild persistent asthma with acute exacerbation 10/03/2020   Seasonal and perennial allergic rhinitis 10/03/2020   Nummular eczema 06/13/2020   Dizziness 04/26/2020   Headache 04/26/2020   Fatigue 04/26/2020   Vaginal prolapse 12/17/2018   Jerking 09/22/2018   Closed head injury 09/11/2018   Mild intermittent asthma without complication 21/19/4174   Multinodular thyroid, follow up US in 05/2019 06/10/2018   Fibromyalgia 05/26/2018   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 to 5 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 ([place]) 03/25/2018   Coccygeal pain 03/25/2018   Difficulty with speech 03/24/2018   Poor balance 03/24/2018   Hip pain 03/24/2018   SAH (subarachnoid hemorrhage) ([place]) 03/06/2018   Chest tightness 02/04/2018   Left lower lobe pulmonary nodule 12/10/2017   Paroxysmal atrial fibrillation ([facility]) 10/30/2017   Chronic sinusitis 03/14/2017   Severe scoliosis 12/11/2016   Prediabetes 06/06/2016   Cough 06/06/2016   Allergic rhinitis 02/08/2016    Osteoporosis 12/05/2015   Hyperlipidemia 05/27/2015   Constipation 08/23/2011    Immunization History  Administered Date(s) Administered   Fluad Quad(high Dose 65+) 12/14/2019   Influenza, High Dose Seasonal PF 12/05/2015, 08/30/2017, 09/22/2018   Influenza, Quadrivalent, Recombinant, Inj, Pf 10/29/2013, 10/04/2014   Influenza-Unspecified 10/29/2013, 10/04/2014   PFIZER(Purple Top)SARS-COV-2 Vaccination 01/24/2020, 02/23/2020, 09/03/2020   Pneumococcal Conjugate-13 01/27/2014   Pneumococcal Polysaccharide-23 12/26/2016   Zoster Recombinat (Shingrix) 01/07/2019, 05/14/2019    Conditions to be addressed/monitored:  Hyperlipidemia, Atrial Fibrillation, Coronary Artery Disease, Asthma, and Allergic Rhinitis  Care Plan : [place]  Updates made by Tomasa Blase, RPH since 04/05/2022 12:00 AM     Problem: Atrial Fibrillation, CAD, HLD and Asthma/Allergies   Priority: High     Long-Range Goal: Disease Management   Start Date: 12/23/2020  Expected End Date: 10/06/2022  Recent Progress: On track  Priority: High  Note:    AFIB  Dx 08/2017 from event monitor, brief runs of PAF. Patient is currently rhythm controlled. BP goal is:  <130/80  Patient checks BP at home infrequently Patient home BP readings are ranging: n/a  Patient has failed these meds in past: Eliquis, amiodarone Patient is currently controlled on the following medications:  Metoprolol tartrate 25 mg - 1/2 tab BID  We discussed:  Role of beta blocker in Afib; effect on BP and HR; encouraged pt to monitor at home for hypotension and bradycardia  Plan  Continue current medications  Hyperlipidemia / CAD  LDL goal < 100 Coronary CTA 11/2017: Ca score 125, mild nonobstructive CAD Lab Results  Component Value Date   LDLCALC 72 06/14/2021  Patient has failed these meds in past: atorvastatin, rosuvastatin  Patient is currently uncontrolled on the following medications:  Pravastatin 40 mg daily  HS Ezetimibe 10 mg daily Coenzyme Q10 100 mg daily  (fibromyalgia) OTC fish oil 1000 mg daily  We discussed:  Patient to continue current medications as LDL was in range with most recent check    Plan  Continue current medications and control with diet and exercise  Coordinate repeat lipid panel with cardiology office  Asthma / Allergies  Last spirometry: 05/13/20 - normal function Patient has failed these meds in past: n/a Patient is currently controlled on the following medications:  Albuterol HFA prn Breztri - 2 puffs BID Ipratropium 0.3% nasal spray PRN Fluticasone nasal spray PRN (XHANCE) Loratadine 10 mg daily NaCl 0.65% nasal spray PRN Olopatadine 0.2% eye drops BID prn  Using maintenance inhaler regularly? Yes Frequency of rescue inhaler use:  daily  We discussed:  proper inhaler technique; pt currently struggling with allergy symptoms due to weather  Plan  Continue current medications       Medication Assistance: None required.  Patient affirms current coverage meets needs.  Care Gaps: COVID booster  DEXA scan  Mammogram   Patient's preferred pharmacy is:  [facility] [id number] [place], [place] [address] [place] [address] [place] [address] Phone: [phone] Fax: [phone]   Uses pill box? Yes Pt endorses 100% compliance  Care Plan and Follow Up Patient Decision:  Patient agrees to Care Plan and Follow-up.  Plan: Telephone follow up appointment with care management team member scheduled for:  6 months The patient has been provided with contact information for the care management team and has been advised to call with any health related questions or concerns.   Tomasa Blase, PharmD Clinical Pharmacist, [place]

## 2022-04-06 ENCOUNTER — Encounter: Payer: Self-pay | Admitting: Gastroenterology

## 2022-04-06 ENCOUNTER — Ambulatory Visit: Payer: PPO | Admitting: Gastroenterology

## 2022-04-06 VITALS — BP 104/68 | HR 98 | Ht 64.0 in | Wt 133.4 lb

## 2022-04-06 DIAGNOSIS — K5909 Other constipation: Secondary | ICD-10-CM

## 2022-04-06 MED ORDER — LINACLOTIDE 145 MCG PO CAPS: 145.0000 ug | ORAL_CAPSULE | Freq: Every day | ORAL | 0 refills | Status: DC

## 2022-04-06 NOTE — Patient Instructions (Addendum)
If you are age 72 or older, your body mass index should be between 23-30. Your Body mass index is 22.89 kg/m. If this is out of the aforementioned range listed, please consider follow up with your Primary Care Provider.  If you are age 42 or younger, your body mass index should be between 19-25. Your Body mass index is 22.89 kg/m. If this is out of the aformentioned range listed, please consider follow up with your Primary Care Provider.   ________________________________________________________  The  GI providers would like to encourage you to use [facility] to communicate with providers for non-urgent requests or questions.  Due to long hold times on the telephone, sending your provider a message by [facility] may be a faster and more efficient way to get a response.  Please allow 48 business hours for a response.  Please remember that this is for non-urgent requests.  _______________________________________________________  Increase Miralax to twice a day and titrate as needed.  We have given you samples of the following medication to take: Linzess 145 mcg: Take one daily before breakfast .  If this is helpful let us know and we can send a prescription.  Discontinue Citrucel.  Thank you for entrusting me with your care and for choosing [facility], Dr. Morrow Cellar
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
## 2022-04-11 DIAGNOSIS — D1801 Hemangioma of skin and subcutaneous tissue: Secondary | ICD-10-CM | POA: Diagnosis not present

## 2022-04-11 DIAGNOSIS — L821 Other seborrheic keratosis: Secondary | ICD-10-CM | POA: Diagnosis not present

## 2022-04-11 DIAGNOSIS — L814 Other melanin hyperpigmentation: Secondary | ICD-10-CM | POA: Diagnosis not present

## 2022-04-11 DIAGNOSIS — L853 Xerosis cutis: Secondary | ICD-10-CM | POA: Diagnosis not present

## 2022-04-11 DIAGNOSIS — L900 Lichen sclerosus et atrophicus: Secondary | ICD-10-CM | POA: Diagnosis not present

## 2022-04-20 DIAGNOSIS — N390 Urinary tract infection, site not specified: Secondary | ICD-10-CM | POA: Diagnosis not present

## 2022-04-20 DIAGNOSIS — N3281 Overactive bladder: Secondary | ICD-10-CM | POA: Diagnosis not present

## 2022-04-20 DIAGNOSIS — R3915 Urgency of urination: Secondary | ICD-10-CM | POA: Diagnosis not present

## 2022-05-30 ENCOUNTER — Other Ambulatory Visit: Payer: Self-pay | Admitting: Gastroenterology

## 2022-05-31 ENCOUNTER — Telehealth: Payer: Self-pay | Admitting: Neurology

## 2022-05-31 ENCOUNTER — Other Ambulatory Visit: Payer: Self-pay

## 2022-05-31 MED ORDER — CARBIDOPA-LEVODOPA 25-100 MG PO TABS: ORAL_TABLET | ORAL | 1 refills | Status: DC

## 2022-05-31 NOTE — Telephone Encounter (Signed)
Pt called in stating she is having a lot of trouble. She says she hurts all over and is shaking all the time. It's hard for her to eat. She is not sure what to do and if she needs to come in to be seen.   She also has not heard anything back about the skin biopsy.

## 2022-05-31 NOTE — Telephone Encounter (Signed)
Pls let her know Dr. Carles Collet is out of the office. In the meantime, she can try increasing the Carbidopa/Levodopa to 2 tabs TID with meals. Further recommendations when Dr. Carles Collet returns. Thanks

## 2022-05-31 NOTE — Telephone Encounter (Signed)
Called patient changed her chart and given her instructions on her medication and biopsy appointment

## 2022-05-31 NOTE — Telephone Encounter (Signed)
Sent to [facility] for scheduling the Biopsy she was approved but was not sent to scheduling

## 2022-06-11 ENCOUNTER — Encounter: Payer: Self-pay | Admitting: Neurology

## 2022-06-11 ENCOUNTER — Other Ambulatory Visit: Payer: Self-pay

## 2022-06-11 ENCOUNTER — Ambulatory Visit: Payer: PPO | Admitting: Neurology

## 2022-06-11 VITALS — BP 110/72 | HR 59 | Ht 64.0 in | Wt 134.0 lb

## 2022-06-11 DIAGNOSIS — G629 Polyneuropathy, unspecified: Secondary | ICD-10-CM | POA: Diagnosis not present

## 2022-06-11 DIAGNOSIS — R251 Tremor, unspecified: Secondary | ICD-10-CM

## 2022-06-11 DIAGNOSIS — R270 Ataxia, unspecified: Secondary | ICD-10-CM | POA: Diagnosis not present

## 2022-06-11 DIAGNOSIS — G20 Parkinson's disease: Secondary | ICD-10-CM | POA: Diagnosis not present

## 2022-06-11 DIAGNOSIS — Z9889 Other specified postprocedural states: Secondary | ICD-10-CM

## 2022-06-11 MED ORDER — LIDOCAINE-EPINEPHRINE 2 %-1:100000 IJ SOLN
5.0000 mL | Freq: Once | INTRAMUSCULAR | Status: AC
  Administered 2022-06-11: 2.5 mL via INTRADERMAL

## 2022-06-11 MED ORDER — LIDOCAINE-EPINEPHRINE 2 %-1:100000 IJ SOLN: 3.0000 mL | Freq: Once | INTRAMUSCULAR | Status: DC

## 2022-06-11 NOTE — Procedures (Signed)
Punch Biopsy Procedure Note  Preprocedure Diagnosis: Bradykinesia; Tremor;   Postprocedure Diagnosis: same  Locations: Site 1: posterior  cervical, left ;  Site 2: above, left knee;  Site 3: above, left foot  Indications: r/o alpha synucleinopathy  Anesthesia: 2.5 mL Lidocaine 1% with epinephrine without added sodium bicarbonate  Procedure Details Patient informed of the risks (including but not limited to bleeding, pain, infection, scar and infection) and benefits of the procedure.  Informed consent obtained.  The areas which were chosen for biopsy, as above, and surrounding areas were given a sterile prep using betadyne and draped in the usual sterile fashion. The skin was then stretched perpendicular to the skin tension lines and sample removed using the 3 mm punch. Pressure applied, hemostasis achieved.   Dressing applied. The specimen(s) was sent for pathologic examination. The patient tolerated the procedure well.  Estimated Blood Loss: 0 ml  Condition: Stable  Complications: none.  Plan: 1. Instructed to keep the wound dry and covered for 24-48h and clean thereafter. 2. Warning signs of infection were reviewed.

## 2022-06-14 NOTE — Progress Notes (Signed)
Subjective:    Patient ID: Kara Ramirez, female    DOB: 10/17/1951, 71 y.o.   MRN: [id number]      HPI Kara Ramirez is here for a Physical exam.    Had a test Monday with neurology - wounds still healing - the one on her left shoulder hurts and had some discharge.      Medications and allergies reviewed with patient and updated if appropriate.  Current Outpatient Medications on File Prior to Visit  Medication Sig Dispense Refill   acetaminophen (TYLENOL) 325 MG tablet Take 1-2 tablets (325-650 mg total) by mouth every 4 (four) hours as needed for mild pain.     albuterol (VENTOLIN HFA) 108 (90 Base) MCG/ACT inhaler INHALE 1 OR 2 PUFFS INTO THE LUNGS EVERY 6 HOURS AS NEEDED FOR WHEEZING OR SHORTNESS OF BREATH 8.5 g 1   alendronate (FOSAMAX) 70 MG tablet Take by mouth.     Apoaequorin (PREVAGEN PO) Take 1 tablet by mouth daily.     aspirin-acetaminophen-caffeine (EXCEDRIN MIGRAINE) 250-250-65 MG tablet Take 1 tablet by mouth every 6 (six) hours as needed for headache.     benzonatate (TESSALON PERLES) 100 MG capsule Take 1 capsule (100 mg total) by mouth 3 (three) times daily as needed. 30 capsule 0   Budeson-Glycopyrrol-Formoterol (BREZTRI AEROSPHERE) 160-9-4.8 MCG/ACT AERO Inhale 2 puffs into the lungs in the morning and at bedtime. 10.7 g 5   Calcium Carbonate-Vitamin D 600-400 MG-UNIT tablet Take 1 tablet by mouth daily.     carbidopa-levodopa (SINEMET IR) 25-100 MG tablet 2 at 7am, 2 at 11am, 2 at 4pm 360 tablet 1   Carboxymethylcellul-Glycerin 1-0.9 % GEL Place 1 application into both eyes at bedtime.     cholecalciferol (VITAMIN D) 1000 UNITS tablet Take 1,000 Units by mouth daily.     Co-Enzyme Q-10 100 MG CAPS Take 100 mg by mouth daily.      divalproex (DEPAKOTE ER) 250 MG 24 hr tablet SMARTSIG:1 Tablet(s) By Mouth     divalproex (DEPAKOTE ER) 500 MG 24 hr tablet SMARTSIG:1 Tablet(s) By Mouth     Eyelid Cleansers (STERILID) FOAM Apply topically.     ezetimibe (ZETIA) 10  MG tablet Take 1 tablet (10 mg total) by mouth daily. 90 tablet 3   famotidine (PEPCID) 20 MG tablet TAKE ONE TABLET BY MOUTH DAILY 90 tablet 1   fluocinonide ointment (LIDEX) 6.72 % Apply 1 application topically See admin instructions. Apply topically twice daily for 5 days alternating with Tacrolimus ointment     fluticasone (FLONASE) 50 MCG/ACT nasal spray USE 1 SPRAY IN EACH NOSTRIL MID-DAY FOR CONGESTION 16 g 4   fluticasone (FLOVENT HFA) 110 MCG/ACT inhaler      ibandronate (BONIVA) 150 MG tablet Take 150 mg by mouth every 30 (thirty) days. Take in the morning with a full glass of water, on an empty stomach, and do not take anything else by mouth or lie down for the next 30 min.      ipratropium (ATROVENT) 0.03 % nasal spray Place 2 sprays into both nostrils every 4 (four) hours as needed for rhinitis. 30 mL 5   linaclotide (LINZESS) 145 MCG CAPS capsule Take 1 capsule (145 mcg total) by mouth daily before breakfast. [id number], Exp: 01-2023 8 capsule 0   loratadine (CLARITIN) 10 MG tablet Take 10 mg by mouth daily.     MALIC ACID PO Take 1 capsule by mouth at bedtime.     metoprolol tartrate (LOPRESSOR) 25 MG tablet  Take 0.5 tablets (12.5 mg total) by mouth 2 (two) times daily. 90 tablet 3   mirabegron ER (MYRBETRIQ) 50 MG TB24 tablet Take 50 mg by mouth daily as needed.     montelukast (SINGULAIR) 5 MG chewable tablet Chew by mouth.     naproxen sodium (ALEVE) 220 MG tablet Take 220 mg by mouth daily as needed.      Olopatadine HCl 0.2 % SOLN Apply to eye 2 (two) times daily as needed.     Omega-3 Fatty Acids (FISH OIL) 1000 MG CAPS Take 1,000 mg by mouth at bedtime.      polyethylene glycol (MIRALAX / GLYCOLAX) 17 g packet Take 17 g by mouth 2 (two) times daily. Titrate as needed     pravastatin (PRAVACHOL) 40 MG tablet Take 1 tablet (40 mg total) by mouth daily. 90 tablet 3   sodium chloride (OCEAN) 0.65 % SOLN nasal spray Place 1 spray into both nostrils as needed for congestion. (Patient  taking differently: Place 1 spray into both nostrils 4 (four) times daily as needed for congestion.)  0   tacrolimus (PROTOPIC) 0.1 % ointment Apply 1 application topically 2 (two) times daily as needed (PRN skin issues). (Patient taking differently: Apply 1 application  topically See admin instructions. Apply topically twice daily for 5 days alternating with Fluocinonide ointment) 100 g 0   Thiamine HCl (VITAMIN B-1) 100 MG tablet Take 100 mg by mouth daily.     triamcinolone ointment (KENALOG) 0.5 % Apply 1 application topically 2 (two) times daily. For rash on left lower leg 30 g 0   No current facility-administered medications on file prior to visit.    Review of Systems  Constitutional:  Negative for fever.  Respiratory:  Positive for cough (chronic from allergies), shortness of breath (occ) and wheezing (occ).   Cardiovascular:  Positive for chest pain (occ) and leg swelling (mild). Negative for palpitations.  Gastrointestinal:  Negative for abdominal pain, blood in stool, constipation, diarrhea and nausea.  Genitourinary:  Negative for dysuria.  Musculoskeletal:  Negative for arthralgias and back pain.  Skin:  Negative for rash.  Neurological:  Positive for tremors, speech difficulty and headaches (chronic).  Psychiatric/Behavioral:  Negative for dysphoric mood and sleep disturbance. The patient is not nervous/anxious.        Objective:   Vitals:   06/15/22 0928  BP: 102/72  Pulse: 64  Temp: 97.9 F (36.6 C)  SpO2: 98%   Filed Weights   06/15/22 0928  Weight: 132 lb 6.4 oz (60.1 kg)   Body mass index is 22.73 kg/m.  BP Readings from Last 3 Encounters:  06/15/22 102/72  06/11/22 110/72  04/06/22 104/68    Wt Readings from Last 3 Encounters:  06/15/22 132 lb 6.4 oz (60.1 kg)  06/11/22 134 lb (60.8 kg)  04/06/22 133 lb 6 oz (60.5 kg)       Physical Exam Constitutional: She appears well-developed and well-nourished. No distress.  HENT:  Head: Normocephalic  and atraumatic.  Right Ear: External ear normal. Normal ear canal and TM Left Ear: External ear normal.  Normal ear canal and TM Mouth/Throat: Oropharynx is clear and moist.  Eyes: Conjunctivae normal.  Neck: Neck supple. No tracheal deviation present. No thyromegaly present.  No carotid bruit  Cardiovascular: Normal rate, regular rhythm and normal heart sounds.   No murmur heard.  No edema. Pulmonary/Chest: Effort normal and breath sounds normal. No respiratory distress. She has no wheezes. She has no rales.  Breast: deferred  Abdominal: Soft. She exhibits no distension. There is no tenderness.  Lymphadenopathy: She has no cervical adenopathy.  Skin: Skin is warm and dry. She is not diaphoretic. Three bunch biopsy lesions on left side - left lower leg, left upper leg and left shoulder - left lower leg and shoulder biopsies with minimal erythema and slight serous discharge Psychiatric: She has a normal mood and affect. Her behavior is normal.     Lab Results  Component Value Date   WBC 5.9 12/05/2021   HGB 13.6 12/05/2021   HCT 40.5 12/05/2021   PLT 251 12/05/2021   GLUCOSE 105 (H) 12/05/2021   CHOL 151 06/14/2021   TRIG 93.0 06/14/2021   HDL 60.40 06/14/2021   LDLCALC 72 06/14/2021   ALT 33 06/14/2021   AST 27 06/14/2021   NA 143 12/05/2021   K 3.9 12/05/2021   CL 110 12/05/2021   CREATININE 0.84 12/05/2021   BUN 17 12/05/2021   CO2 23 12/05/2021   TSH 0.65 06/14/2021   INR 1.00 08/07/2018   HGBA1C 5.9 06/14/2021         Assessment & Plan:   Physical exam: Screening blood work  ordered Eating pretty healthy Exercise  some walking Weight  normal Substance abuse  none   Reviewed recommended immunizations.   Health Maintenance  Topic Date Due   DEXA SCAN  11/06/2021   MAMMOGRAM  11/08/2021   COVID-19 Vaccine (4 - Pfizer series) 07/01/2022 (Originally 10/29/2020)   TETANUS/TDAP  12/12/2022 (Originally 04/27/1970)   INFLUENZA VACCINE  06/19/2022    COLONOSCOPY (Pts 45-7yr Insurance coverage will need to be confirmed)  06/07/2025   Pneumonia Vaccine 71 Years old  Completed   Hepatitis C Screening  Completed   Zoster Vaccines- Shingrix  Completed   HPV VACCINES  Aged Out          See Problem List for Assessment and Plan of chronic medical problems.

## 2022-06-14 NOTE — Patient Instructions (Addendum)
Blood work was ordered.     Medications changes include :   none    Return in about 1 year (around 06/16/2023) for Physical Exam.   Health Maintenance, Female Adopting a healthy lifestyle and getting preventive care are important in promoting health and wellness. Ask your health care provider about: The right schedule for you to have regular tests and exams. Things you can do on your own to prevent diseases and keep yourself healthy. What should I know about diet, weight, and exercise? Eat a healthy diet  Eat a diet that includes plenty of vegetables, fruits, low-fat dairy products, and lean protein. Do not eat a lot of foods that are high in solid fats, added sugars, or sodium. Maintain a healthy weight Body mass index (BMI) is used to identify weight problems. It estimates body fat based on height and weight. Your health care provider can help determine your BMI and help you achieve or maintain a healthy weight. Get regular exercise Get regular exercise. This is one of the most important things you can do for your health. Most adults should: Exercise for at least 150 minutes each week. The exercise should increase your heart rate and make you sweat (moderate-intensity exercise). Do strengthening exercises at least twice a week. This is in addition to the moderate-intensity exercise. Spend less time sitting. Even light physical activity can be beneficial. Watch cholesterol and blood lipids Have your blood tested for lipids and cholesterol at 72 years of age, then have this test every 5 years. Have your cholesterol levels checked more often if: Your lipid or cholesterol levels are high. You are older than 72 years of age. You are at high risk for heart disease. What should I know about cancer screening? Depending on your health history and family history, you may need to have cancer screening at various ages. This may include screening for: Breast cancer. Cervical  cancer. Colorectal cancer. Skin cancer. Lung cancer. What should I know about heart disease, diabetes, and high blood pressure? Blood pressure and heart disease High blood pressure causes heart disease and increases the risk of stroke. This is more likely to develop in people who have high blood pressure readings or are overweight. Have your blood pressure checked: Every 3-5 years if you are 80-80 years of age. Every year if you are 17 years old or older. Diabetes Have regular diabetes screenings. This checks your fasting blood sugar level. Have the screening done: Once every three years after age 95 if you are at a normal weight and have a low risk for diabetes. More often and at a younger age if you are overweight or have a high risk for diabetes. What should I know about preventing infection? Hepatitis B If you have a higher risk for hepatitis B, you should be screened for this virus. Talk with your health care provider to find out if you are at risk for hepatitis B infection. Hepatitis C Testing is recommended for: Everyone born from [birth year] through 1966. Anyone with known risk factors for hepatitis C. Sexually transmitted infections (STIs) Get screened for STIs, including gonorrhea and chlamydia, if: You are sexually active and are younger than 72 years of age. You are older than 72 years of age and your health care provider tells you that you are at risk for this type of infection. Your sexual activity has changed since you were last screened, and you are at increased risk for chlamydia or gonorrhea. Ask your health care  provider if you are at risk. Ask your health care provider about whether you are at high risk for HIV. Your health care provider may recommend a prescription medicine to help prevent HIV infection. If you choose to take medicine to prevent HIV, you should first get tested for HIV. You should then be tested every 3 months for as long as you are taking the  medicine. Pregnancy If you are about to stop having your period (premenopausal) and you may become pregnant, seek counseling before you get pregnant. Take 400 to 800 micrograms (mcg) of folic acid every day if you become pregnant. Ask for birth control (contraception) if you want to prevent pregnancy. Osteoporosis and menopause Osteoporosis is a disease in which the bones lose minerals and strength with aging. This can result in bone fractures. If you are 75 years old or older, or if you are at risk for osteoporosis and fractures, ask your health care provider if you should: Be screened for bone loss. Take a calcium or vitamin D supplement to lower your risk of fractures. Be given hormone replacement therapy (HRT) to treat symptoms of menopause. Follow these instructions at home: Alcohol use Do not drink alcohol if: Your health care provider tells you not to drink. You are pregnant, may be pregnant, or are planning to become pregnant. If you drink alcohol: Limit how much you have to: 0-1 drink a day. Know how much alcohol is in your drink. In the [place] one drink equals one 12 oz bottle of beer (355 mL), one 5 oz glass of wine (148 mL), or one 1 oz glass of hard liquor (44 mL). Lifestyle Do not use any products that contain nicotine or tobacco. These products include cigarettes, chewing tobacco, and vaping devices, such as e-cigarettes. If you need help quitting, ask your health care provider. Do not use street drugs. Do not share needles. Ask your health care provider for help if you need support or information about quitting drugs. General instructions Schedule regular health, dental, and eye exams. Stay current with your vaccines. Tell your health care provider if: You often feel depressed. You have ever been abused or do not feel safe at home. Summary Adopting a healthy lifestyle and getting preventive care are important in promoting health and wellness. Follow your health care  provider's instructions about healthy diet, exercising, and getting tested or screened for diseases. Follow your health care provider's instructions on monitoring your cholesterol and blood pressure. This information is not intended to replace advice given to you by your health care provider. Make sure you discuss any questions you have with your health care provider. Document Revised: 03/27/2021 Document Reviewed: 03/27/2021 Elsevier Patient Education  [employer].

## 2022-06-15 ENCOUNTER — Ambulatory Visit (INDEPENDENT_AMBULATORY_CARE_PROVIDER_SITE_OTHER): Payer: PPO | Admitting: Internal Medicine

## 2022-06-15 ENCOUNTER — Encounter: Payer: Self-pay | Admitting: Internal Medicine

## 2022-06-15 VITALS — BP 102/72 | HR 64 | Temp 97.9°F | Ht 64.0 in | Wt 132.4 lb

## 2022-06-15 DIAGNOSIS — R7303 Prediabetes: Secondary | ICD-10-CM

## 2022-06-15 DIAGNOSIS — S81802A Unspecified open wound, left lower leg, initial encounter: Secondary | ICD-10-CM

## 2022-06-15 DIAGNOSIS — K219 Gastro-esophageal reflux disease without esophagitis: Secondary | ICD-10-CM | POA: Diagnosis not present

## 2022-06-15 DIAGNOSIS — I480 Paroxysmal atrial fibrillation: Secondary | ICD-10-CM | POA: Diagnosis not present

## 2022-06-15 DIAGNOSIS — S41002A Unspecified open wound of left shoulder, initial encounter: Secondary | ICD-10-CM | POA: Diagnosis not present

## 2022-06-15 DIAGNOSIS — E042 Nontoxic multinodular goiter: Secondary | ICD-10-CM | POA: Diagnosis not present

## 2022-06-15 DIAGNOSIS — E782 Mixed hyperlipidemia: Secondary | ICD-10-CM

## 2022-06-15 DIAGNOSIS — Z0000 Encounter for general adult medical examination without abnormal findings: Secondary | ICD-10-CM

## 2022-06-15 DIAGNOSIS — M810 Age-related osteoporosis without current pathological fracture: Secondary | ICD-10-CM | POA: Diagnosis not present

## 2022-06-15 LAB — CBC WITH DIFFERENTIAL/PLATELET
Basophils Absolute: 0 10*3/uL (ref 0.0–0.1)
Basophils Relative: 0.5 % (ref 0.0–3.0)
Eosinophils Absolute: 0.2 10*3/uL (ref 0.0–0.7)
Eosinophils Relative: 1.8 % (ref 0.0–5.0)
HCT: 41.3 % (ref 36.0–46.0)
Hemoglobin: 13.6 g/dL (ref 12.0–15.0)
Lymphocytes Relative: 22.5 % (ref 12.0–46.0)
Lymphs Abs: 2.1 10*3/uL (ref 0.7–4.0)
MCHC: 32.9 g/dL (ref 30.0–36.0)
MCV: 89.5 fl (ref 78.0–100.0)
Monocytes Absolute: 0.6 10*3/uL (ref 0.1–1.0)
Monocytes Relative: 6 % (ref 3.0–12.0)
Neutro Abs: 6.3 10*3/uL (ref 1.4–7.7)
Neutrophils Relative %: 69.2 % (ref 43.0–77.0)
Platelets: 266 10*3/uL (ref 150.0–400.0)
RBC: 4.61 Mil/uL (ref 3.87–5.11)
RDW: 13.4 % (ref 11.5–15.5)
WBC: 9.2 10*3/uL (ref 4.0–10.5)

## 2022-06-15 LAB — COMPREHENSIVE METABOLIC PANEL
ALT: 20 U/L (ref 0–35)
AST: 20 U/L (ref 0–37)
Albumin: 4.6 g/dL (ref 3.5–5.2)
Alkaline Phosphatase: 56 U/L (ref 39–117)
BUN: 17 mg/dL (ref 6–23)
CO2: 28 mEq/L (ref 19–32)
Calcium: 10.1 mg/dL (ref 8.4–10.5)
Chloride: 104 mEq/L (ref 96–112)
Creatinine, Ser: 0.83 mg/dL (ref 0.40–1.20)
GFR: 70.57 mL/min (ref >60.00)
Glucose, Bld: 84 mg/dL (ref 70–99)
Potassium: 4 mEq/L (ref 3.5–5.1)
Sodium: 140 mEq/L (ref 135–145)
Total Bilirubin: 0.6 mg/dL (ref 0.2–1.2)
Total Protein: 7.5 g/dL (ref 6.0–8.3)

## 2022-06-15 LAB — LIPID PANEL
Cholesterol: 164 mg/dL (ref 0–200)
HDL: 68.2 mg/dL (ref >39.00)
LDL Cholesterol: 77 mg/dL (ref 0–99)
NonHDL: 95.36
Total CHOL/HDL Ratio: 2
Triglycerides: 94 mg/dL (ref 0.0–149.0)
VLDL: 18.8 mg/dL (ref 0.0–40.0)

## 2022-06-15 LAB — TSH: TSH: 0.62 u[IU]/mL (ref 0.35–5.50)

## 2022-06-15 LAB — VITAMIN D 25 HYDROXY (VIT D DEFICIENCY, FRACTURES): VITD: 33.32 ng/mL (ref 30.00–100.00)

## 2022-06-15 LAB — HEMOGLOBIN A1C: Hgb A1c MFr Bld: 5.8 % (ref 4.6–6.5)

## 2022-06-15 NOTE — Assessment & Plan Note (Signed)
Following with cardiology History of atrial fibrillation, but no recent A-fib On metoprolol In normal rhythm here today CBC, CMP, TSH

## 2022-06-15 NOTE — Assessment & Plan Note (Signed)
Chronic Management per GYN On Boniva 150 mg monthly Taking calcium and vitamin D

## 2022-06-15 NOTE — Assessment & Plan Note (Signed)
Chronic GERD controlled Continue Pepcid 20 mg daily

## 2022-06-15 NOTE — Assessment & Plan Note (Signed)
Acute Had what looks like a punch biopsy done 4 days ago and there is mild erythema around the biopsy site with some slight discharge Possible mild infection Wound cleaned and antibacterial ointment placed Advised her to start applying antibacterial ointment and keep it covered Monitor closely and if there is expanding redness or any pus to call and I will start her on antibiotics

## 2022-06-15 NOTE — Assessment & Plan Note (Signed)
Chronic Check a1c Low sugar / carb diet Stressed regular exercise  

## 2022-06-15 NOTE — Assessment & Plan Note (Signed)
Chronic Check TSH Nodules have been biopsied in the past similar benign

## 2022-06-15 NOTE — Assessment & Plan Note (Signed)
Chronic Regular exercise and healthy diet encouraged Check lipid panel  Continue pravastatin 40 mg daily 

## 2022-06-16 ENCOUNTER — Telehealth: Payer: Self-pay | Admitting: Internal Medicine

## 2022-06-16 NOTE — Telephone Encounter (Signed)
Please call her - she does not always use [facility]   Her vitamin d level is good.  Your blood counts, thyroid function, kidney function and liver tests are normal.  Your cholesterol is very good.  Her sugars are stable in the prediabetic range.

## 2022-06-18 NOTE — Telephone Encounter (Signed)
Spoke with patient today and results given.

## 2022-06-20 ENCOUNTER — Telehealth: Payer: Self-pay | Admitting: Neurology

## 2022-06-20 NOTE — Telephone Encounter (Signed)
Pt called in stating she got a skin biopsy done on 06/11/22. She says 2 of her sores don't look good. One has healed up well. She would like to speak with someone about it.

## 2022-06-20 NOTE — Telephone Encounter (Signed)
Pt called no answer left a voice mail to call the office back  °

## 2022-06-20 NOTE — Telephone Encounter (Signed)
Pt called no answer no voice mail picked up

## 2022-06-21 ENCOUNTER — Telehealth: Payer: Self-pay | Admitting: Internal Medicine

## 2022-06-21 MED ORDER — DOXYCYCLINE HYCLATE 100 MG PO TABS: 100.0000 mg | ORAL_TABLET | Freq: Two times a day (BID) | ORAL | 0 refills | Status: AC

## 2022-06-21 NOTE — Telephone Encounter (Signed)
Pt called an informed to call her PCP to start antibiotic. They had charted at her well visit that they would start her on some if it didn't get better in a couple of days.

## 2022-06-21 NOTE — Telephone Encounter (Signed)
Pt states she has a place on her leg that has not healed. She said Dr. Carles Collet worked on it. She said Dr. Quay Burow cleaned it and redressed it for her. She said Dr. Quay Burow advised her to call our office and ask for an antibiotic to be sent in if her leg had not improved.     Please advise

## 2022-06-21 NOTE — Telephone Encounter (Signed)
Prescription sent to pharmacy.

## 2022-06-27 ENCOUNTER — Telehealth: Payer: Self-pay | Admitting: Neurology

## 2022-06-27 NOTE — Telephone Encounter (Signed)
Called patient and was able to deliver results but was disconnected and not able to reach again. Waiting for patient to call me back

## 2022-06-27 NOTE — Telephone Encounter (Signed)
Patient would like PT on [address] needs your approval she would like to think about [facility] She asked we would a hand specialist be able to help? With the shaking in her hand ?

## 2022-06-27 NOTE — Telephone Encounter (Signed)
Pt is returning [facility] call. Call dropped 2x

## 2022-06-27 NOTE — Telephone Encounter (Signed)
Please call patient and let her know that skin biopsy was negative for alpha-synuclein, the hallmark pathology associated with Parkinson's disease.  It was also negative for small fiber neuropathy and negative for amyloid deposition in cutaneous nerves.  While this is good, and I know it does not give her answers.  Unfortunately, as I have told her in the past, I am just not sure what she has or why symptoms are deteriorating.  I am not sure what else to do.  She has already had a neuromuscular opinion in our office.  We sent her to [facility] for another opinion, and she was frustrated with that opinion.  I really would like her to get back to [facility], but I think she was ultimately frustrated that they did not do anything.  Her options at this point are to go back to [facility] again, or to get another consult perhaps at [facility].  Unfortunately, I have exhausted everything that I know how to do here within my office.  Find out if we can send a referral for her, or set that appointment up at [facility].

## 2022-06-28 ENCOUNTER — Other Ambulatory Visit: Payer: Self-pay

## 2022-06-28 DIAGNOSIS — R2681 Unsteadiness on feet: Secondary | ICD-10-CM

## 2022-06-28 DIAGNOSIS — G20 Parkinson's disease: Secondary | ICD-10-CM

## 2022-06-28 DIAGNOSIS — R251 Tremor, unspecified: Secondary | ICD-10-CM

## 2022-06-28 NOTE — Telephone Encounter (Signed)
Called patient and let her know that the PT referral

## 2022-06-28 NOTE — Telephone Encounter (Signed)
Sent PT order and will call patient and let her know

## 2022-06-28 NOTE — Telephone Encounter (Signed)
PT is ok. No, a hand specialist wouldn't be of value.  She really needs to seek another opinion.

## 2022-07-04 ENCOUNTER — Ambulatory Visit: Payer: PPO | Attending: Neurology | Admitting: Physical Therapy

## 2022-07-04 ENCOUNTER — Encounter: Payer: Self-pay | Admitting: Physical Therapy

## 2022-07-04 DIAGNOSIS — R2681 Unsteadiness on feet: Secondary | ICD-10-CM | POA: Insufficient documentation

## 2022-07-04 DIAGNOSIS — R2689 Other abnormalities of gait and mobility: Secondary | ICD-10-CM | POA: Diagnosis not present

## 2022-07-04 DIAGNOSIS — R29818 Other symptoms and signs involving the nervous system: Secondary | ICD-10-CM | POA: Diagnosis not present

## 2022-07-04 DIAGNOSIS — R251 Tremor, unspecified: Secondary | ICD-10-CM | POA: Insufficient documentation

## 2022-07-04 DIAGNOSIS — G20 Parkinson's disease: Secondary | ICD-10-CM | POA: Insufficient documentation

## 2022-07-04 DIAGNOSIS — M6281 Muscle weakness (generalized): Secondary | ICD-10-CM | POA: Diagnosis not present

## 2022-07-04 DIAGNOSIS — R293 Abnormal posture: Secondary | ICD-10-CM | POA: Insufficient documentation
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
## 2022-07-09 ENCOUNTER — Encounter: Payer: Self-pay | Admitting: Physical Therapy

## 2022-07-09 ENCOUNTER — Ambulatory Visit: Payer: PPO | Admitting: Physical Therapy

## 2022-07-09 DIAGNOSIS — R2681 Unsteadiness on feet: Secondary | ICD-10-CM

## 2022-07-09 DIAGNOSIS — R2689 Other abnormalities of gait and mobility: Secondary | ICD-10-CM

## 2022-07-09 DIAGNOSIS — M6281 Muscle weakness (generalized): Secondary | ICD-10-CM | POA: Diagnosis not present
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
## 2022-07-11 ENCOUNTER — Telehealth: Payer: Self-pay | Admitting: Neurology

## 2022-07-11 DIAGNOSIS — G20 Parkinson's disease: Secondary | ICD-10-CM

## 2022-07-11 DIAGNOSIS — R2681 Unsteadiness on feet: Secondary | ICD-10-CM

## 2022-07-11 NOTE — Telephone Encounter (Signed)
Patient's therapist recommended rolling walker with a seat, per patient. She is needing to sit more when walking, she gets tired she said.

## 2022-07-12 ENCOUNTER — Ambulatory Visit: Payer: PPO | Admitting: Physical Therapy

## 2022-07-12 ENCOUNTER — Encounter: Payer: Self-pay | Admitting: Physical Therapy

## 2022-07-12 DIAGNOSIS — M6281 Muscle weakness (generalized): Secondary | ICD-10-CM

## 2022-07-12 DIAGNOSIS — R2689 Other abnormalities of gait and mobility: Secondary | ICD-10-CM

## 2022-07-12 DIAGNOSIS — R2681 Unsteadiness on feet: Secondary | ICD-10-CM

## 2022-07-12 DIAGNOSIS — R293 Abnormal posture: Secondary | ICD-10-CM

## 2022-07-12 MED ORDER — ROLLER WALKER MISC: 0 refills | Status: AC

## 2022-07-12 NOTE — Telephone Encounter (Signed)
Called patient and informed her that we can go ahead and provide her with a prescription for a Rollator Walker. Patient has requested I mail the rx to her so she may take it to a medical supply store. Rx has been signed and mailed to patient.
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
## 2022-07-16 ENCOUNTER — Encounter: Payer: Self-pay | Admitting: Physical Therapy

## 2022-07-16 ENCOUNTER — Ambulatory Visit: Payer: PPO | Admitting: Physical Therapy

## 2022-07-16 DIAGNOSIS — M6281 Muscle weakness (generalized): Secondary | ICD-10-CM

## 2022-07-16 DIAGNOSIS — R2689 Other abnormalities of gait and mobility: Secondary | ICD-10-CM

## 2022-07-16 DIAGNOSIS — R293 Abnormal posture: Secondary | ICD-10-CM

## 2022-07-16 DIAGNOSIS — R2681 Unsteadiness on feet: Secondary | ICD-10-CM
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
## 2022-07-19 ENCOUNTER — Ambulatory Visit: Payer: PPO | Admitting: Physical Therapy

## 2022-07-19 ENCOUNTER — Encounter: Payer: Self-pay | Admitting: Physical Therapy

## 2022-07-19 DIAGNOSIS — M6281 Muscle weakness (generalized): Secondary | ICD-10-CM | POA: Diagnosis not present

## 2022-07-19 DIAGNOSIS — R2681 Unsteadiness on feet: Secondary | ICD-10-CM

## 2022-07-19 DIAGNOSIS — R293 Abnormal posture: Secondary | ICD-10-CM

## 2022-07-19 DIAGNOSIS — R2689 Other abnormalities of gait and mobility: Secondary | ICD-10-CM
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
## 2022-07-24 ENCOUNTER — Ambulatory Visit: Payer: PPO | Attending: Neurology | Admitting: Physical Therapy

## 2022-07-24 ENCOUNTER — Telehealth: Payer: Self-pay | Admitting: Physical Therapy

## 2022-07-24 ENCOUNTER — Encounter: Payer: Self-pay | Admitting: Physical Therapy

## 2022-07-24 VITALS — BP 113/77 | HR 85

## 2022-07-24 DIAGNOSIS — R2689 Other abnormalities of gait and mobility: Secondary | ICD-10-CM | POA: Diagnosis not present

## 2022-07-24 DIAGNOSIS — R2681 Unsteadiness on feet: Secondary | ICD-10-CM | POA: Diagnosis not present

## 2022-07-24 DIAGNOSIS — M6281 Muscle weakness (generalized): Secondary | ICD-10-CM | POA: Insufficient documentation

## 2022-07-24 DIAGNOSIS — R293 Abnormal posture: Secondary | ICD-10-CM | POA: Insufficient documentation
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
## 2022-07-24 NOTE — Telephone Encounter (Signed)
Dr. Carles Collet,   Thank you for sending an order for a new rollator for this patient.   In order for insurance to cover this, there has to be written documentation on why she needs one for her balance/gait and safety. Would you be able to addend your last note to include this information?   Thank you, Janann August, PT, DPT 07/24/22 1:01 PM    [facility] [address] [place], [place]  [address] Phone:  [phone] Fax:  [phone]

## 2022-07-26 ENCOUNTER — Ambulatory Visit: Payer: PPO | Admitting: Physical Therapy

## 2022-07-26 VITALS — BP 104/69 | HR 71

## 2022-07-26 DIAGNOSIS — M6281 Muscle weakness (generalized): Secondary | ICD-10-CM

## 2022-07-26 DIAGNOSIS — R2689 Other abnormalities of gait and mobility: Secondary | ICD-10-CM

## 2022-07-26 DIAGNOSIS — R2681 Unsteadiness on feet: Secondary | ICD-10-CM
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
## 2022-07-30 ENCOUNTER — Ambulatory Visit: Payer: PPO | Admitting: Physical Therapy

## 2022-07-30 DIAGNOSIS — R2681 Unsteadiness on feet: Secondary | ICD-10-CM

## 2022-07-30 DIAGNOSIS — M6281 Muscle weakness (generalized): Secondary | ICD-10-CM | POA: Diagnosis not present

## 2022-07-30 DIAGNOSIS — R293 Abnormal posture: Secondary | ICD-10-CM

## 2022-07-30 DIAGNOSIS — R2689 Other abnormalities of gait and mobility: Secondary | ICD-10-CM

## 2022-07-30 NOTE — Patient Instructions (Signed)
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
## 2022-08-02 ENCOUNTER — Ambulatory Visit: Payer: PPO | Admitting: Physical Therapy

## 2022-08-02 ENCOUNTER — Encounter: Payer: Self-pay | Admitting: Physical Therapy

## 2022-08-02 DIAGNOSIS — R2681 Unsteadiness on feet: Secondary | ICD-10-CM

## 2022-08-02 DIAGNOSIS — M6281 Muscle weakness (generalized): Secondary | ICD-10-CM

## 2022-08-02 DIAGNOSIS — R2689 Other abnormalities of gait and mobility: Secondary | ICD-10-CM
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
## 2022-08-06 ENCOUNTER — Ambulatory Visit: Payer: PPO | Admitting: Physical Therapy

## 2022-08-06 DIAGNOSIS — M6281 Muscle weakness (generalized): Secondary | ICD-10-CM | POA: Diagnosis not present

## 2022-08-06 DIAGNOSIS — R2689 Other abnormalities of gait and mobility: Secondary | ICD-10-CM

## 2022-08-06 DIAGNOSIS — R2681 Unsteadiness on feet: Secondary | ICD-10-CM

## 2022-08-06 DIAGNOSIS — R293 Abnormal posture: Secondary | ICD-10-CM
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
## 2022-08-09 ENCOUNTER — Telehealth: Payer: Self-pay | Admitting: Neurology

## 2022-08-09 ENCOUNTER — Telehealth: Payer: PPO

## 2022-08-09 ENCOUNTER — Ambulatory Visit: Payer: PPO | Admitting: Physical Therapy

## 2022-08-09 ENCOUNTER — Other Ambulatory Visit: Payer: Self-pay

## 2022-08-09 MED ORDER — METOPROLOL TARTRATE 25 MG PO TABS: 12.5000 mg | ORAL_TABLET | Freq: Two times a day (BID) | ORAL | 3 refills | Status: DC

## 2022-08-09 NOTE — Telephone Encounter (Signed)
Patient called to request a referral to:  [facility]  "Crysten Rage-S" is the doctor Fax [phone]

## 2022-08-13 ENCOUNTER — Ambulatory Visit: Payer: PPO | Admitting: Physical Therapy

## 2022-08-13 DIAGNOSIS — R293 Abnormal posture: Secondary | ICD-10-CM

## 2022-08-13 DIAGNOSIS — R2689 Other abnormalities of gait and mobility: Secondary | ICD-10-CM

## 2022-08-13 DIAGNOSIS — R2681 Unsteadiness on feet: Secondary | ICD-10-CM

## 2022-08-13 DIAGNOSIS — M6281 Muscle weakness (generalized): Secondary | ICD-10-CM | POA: Diagnosis not present
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
## 2022-08-16 ENCOUNTER — Ambulatory Visit: Payer: PPO | Admitting: Physical Therapy

## 2022-08-16 VITALS — BP 113/68 | HR 62

## 2022-08-16 DIAGNOSIS — R2681 Unsteadiness on feet: Secondary | ICD-10-CM

## 2022-08-16 DIAGNOSIS — M6281 Muscle weakness (generalized): Secondary | ICD-10-CM

## 2022-08-16 DIAGNOSIS — R2689 Other abnormalities of gait and mobility: Secondary | ICD-10-CM
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
## 2022-08-20 ENCOUNTER — Ambulatory Visit: Payer: PPO | Attending: Neurology | Admitting: Physical Therapy

## 2022-08-20 ENCOUNTER — Encounter: Payer: Self-pay | Admitting: Physical Therapy

## 2022-08-20 DIAGNOSIS — R293 Abnormal posture: Secondary | ICD-10-CM | POA: Diagnosis not present

## 2022-08-20 DIAGNOSIS — R279 Unspecified lack of coordination: Secondary | ICD-10-CM | POA: Insufficient documentation

## 2022-08-20 DIAGNOSIS — R2689 Other abnormalities of gait and mobility: Secondary | ICD-10-CM | POA: Insufficient documentation

## 2022-08-20 DIAGNOSIS — R29818 Other symptoms and signs involving the nervous system: Secondary | ICD-10-CM | POA: Diagnosis not present

## 2022-08-20 DIAGNOSIS — R2681 Unsteadiness on feet: Secondary | ICD-10-CM | POA: Diagnosis not present

## 2022-08-20 DIAGNOSIS — M6281 Muscle weakness (generalized): Secondary | ICD-10-CM | POA: Diagnosis not present
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
## 2022-08-23 ENCOUNTER — Ambulatory Visit: Payer: PPO | Admitting: Physical Therapy

## 2022-08-23 ENCOUNTER — Telehealth: Payer: Self-pay | Admitting: Gastroenterology

## 2022-08-23 DIAGNOSIS — R2689 Other abnormalities of gait and mobility: Secondary | ICD-10-CM

## 2022-08-23 DIAGNOSIS — R29818 Other symptoms and signs involving the nervous system: Secondary | ICD-10-CM

## 2022-08-23 DIAGNOSIS — M6281 Muscle weakness (generalized): Secondary | ICD-10-CM | POA: Diagnosis not present

## 2022-08-23 DIAGNOSIS — R293 Abnormal posture: Secondary | ICD-10-CM

## 2022-08-23 DIAGNOSIS — R279 Unspecified lack of coordination: Secondary | ICD-10-CM

## 2022-08-23 DIAGNOSIS — R2681 Unsteadiness on feet: Secondary | ICD-10-CM

## 2022-08-23 MED ORDER — LINACLOTIDE 145 MCG PO CAPS: 145.0000 ug | ORAL_CAPSULE | Freq: Every day | ORAL | 2 refills | Status: DC

## 2022-08-23 NOTE — Telephone Encounter (Signed)
Returned call to patient. She states that she is having issues with constipation. Pt states that she passed some stool yesterday, she feels like she has to have a BM now but when she goes to the restroom she does not pass any stool. Pt states that she took Miralax last night but she feels like it doesn't help and "just clogs her up". I told pt to discontinue Linzess if it is not working. Pt states that the [place] worked when she tried it but she ran out of samples. I told pt that I will send in a prescription for Linzess 145 mcg to her pharmacy on file. Pt will try this for 1 week. If no improvement in symptoms patient will call back to schedule an office visit for further evaluation. Pt verbalized understanding and had no concerns at the end of the call.
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
## 2022-08-23 NOTE — Telephone Encounter (Signed)
Patient called states she is having trouble using the bathroom. Requesting a call back. Please call to follow

## 2022-08-27 ENCOUNTER — Ambulatory Visit: Payer: PPO | Admitting: Physical Therapy

## 2022-08-28 ENCOUNTER — Ambulatory Visit: Payer: PPO | Admitting: Physical Therapy

## 2022-08-28 ENCOUNTER — Encounter: Payer: Self-pay | Admitting: Physical Therapy

## 2022-08-28 DIAGNOSIS — R2681 Unsteadiness on feet: Secondary | ICD-10-CM

## 2022-08-28 DIAGNOSIS — M6281 Muscle weakness (generalized): Secondary | ICD-10-CM | POA: Diagnosis not present

## 2022-08-28 DIAGNOSIS — R2689 Other abnormalities of gait and mobility: Secondary | ICD-10-CM

## 2022-08-28 DIAGNOSIS — R293 Abnormal posture: Secondary | ICD-10-CM
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
## 2022-08-30 ENCOUNTER — Encounter: Payer: Self-pay | Admitting: Physical Therapy

## 2022-08-30 ENCOUNTER — Ambulatory Visit: Payer: PPO | Admitting: Physical Therapy

## 2022-08-30 DIAGNOSIS — R2681 Unsteadiness on feet: Secondary | ICD-10-CM

## 2022-08-30 DIAGNOSIS — R2689 Other abnormalities of gait and mobility: Secondary | ICD-10-CM

## 2022-08-30 DIAGNOSIS — M6281 Muscle weakness (generalized): Secondary | ICD-10-CM

## 2022-08-30 DIAGNOSIS — R293 Abnormal posture: Secondary | ICD-10-CM
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
## 2022-09-06 ENCOUNTER — Telehealth: Payer: Self-pay | Admitting: Neurology

## 2022-09-06 NOTE — Telephone Encounter (Signed)
Therapy is asking for continuation of Pt and adding in speech for this patient. She feels it has helped her walking and stretching but her shaking has not stopped. Patient said when she went to [facility] he only prescribed her carbidopa levodopa after she had told him she couldn't take it and he siad she had to. Patient is sure she is not going back to [facility] she didn't care for Dr. At all

## 2022-09-06 NOTE — Telephone Encounter (Signed)
Pt called in and left a message. She stated she has been doing therapy and it is now finished. She was told she needed to talk to Dr. Carles Collet about it.

## 2022-09-07 ENCOUNTER — Other Ambulatory Visit: Payer: Self-pay

## 2022-09-07 ENCOUNTER — Telehealth: Payer: Self-pay | Admitting: Internal Medicine

## 2022-09-07 DIAGNOSIS — R251 Tremor, unspecified: Secondary | ICD-10-CM

## 2022-09-07 DIAGNOSIS — G20C Parkinsonism, unspecified: Secondary | ICD-10-CM

## 2022-09-07 DIAGNOSIS — R2681 Unsteadiness on feet: Secondary | ICD-10-CM

## 2022-09-07 NOTE — Telephone Encounter (Signed)
Do you want to approve speech therapy and OT for her ?

## 2022-09-07 NOTE — Telephone Encounter (Signed)
N/A unable to leave a message for patient to call back to schedule Medicare Annual Wellness Visit   Last AWV  09/02/20  Please schedule at anytime with [facility] if patient calls the office back.     Any questions, please call me at [phone]

## 2022-09-07 NOTE — Telephone Encounter (Signed)
Called patient and sent in rehab

## 2022-09-07 NOTE — Telephone Encounter (Signed)
I see my answer please disregard last message for therapy

## 2022-09-10 ENCOUNTER — Encounter: Payer: Self-pay | Admitting: Cardiovascular Disease

## 2022-09-10 ENCOUNTER — Ambulatory Visit: Payer: PPO | Attending: Cardiovascular Disease | Admitting: Cardiovascular Disease

## 2022-09-10 VITALS — BP 110/66 | HR 61 | Ht 65.0 in | Wt 128.0 lb

## 2022-09-10 DIAGNOSIS — R931 Abnormal findings on diagnostic imaging of heart and coronary circulation: Secondary | ICD-10-CM | POA: Diagnosis not present

## 2022-09-10 DIAGNOSIS — R0789 Other chest pain: Secondary | ICD-10-CM | POA: Diagnosis not present

## 2022-09-10 DIAGNOSIS — E782 Mixed hyperlipidemia: Secondary | ICD-10-CM | POA: Diagnosis not present

## 2022-09-10 DIAGNOSIS — I480 Paroxysmal atrial fibrillation: Secondary | ICD-10-CM

## 2022-09-10 MED ORDER — EZETIMIBE 10 MG PO TABS: 10.0000 mg | ORAL_TABLET | Freq: Every day | ORAL | 3 refills | Status: DC

## 2022-09-10 NOTE — Assessment & Plan Note (Signed)
Coronary calcium score performed 12/10/2017 was 125.  They did notice a left lower lobe nodule.  She ultimately had a VATS procedure by Dr. Roxan Hockey.  The pathology was benign.

## 2022-09-10 NOTE — Progress Notes (Signed)
09/10/2022 Kara Ramirez   1951/09/13  [id number]  Primary Physician Kara Rail, MD Primary Cardiologist: Kara Harp MD Kara Ramirez, [place]  HPI:  Kara Ramirez is a 71 y.o.  mildly overweight married Caucasian female mother of 2, grandmother of 1 grandchild whose husband Kara Ramirez is also patient mild is accompanying her today. She is retired from working in [employer].  I last saw her in the office 07/21/2021.[name] Her primary care physician is Kara Ramirez. I last saw her in the office 05/27/15. She really has no risk factors other than mild hyperlipidemia on red yeast rice. Her sister did have myocardial infarctions. She's had chest pain off and on for a year which is somewhat atypical. It occurs typically last for minutes at a time with occasional associated shortness of breath. Performed 2-D echocardiography and Myoview stress testing July 2016 which were normal. She was recently seen at [facility] September with chest pain and shortness of breath. A dobutamine echo was normal. She saw Kara Ramirez Kara Ramirez in the  office 09/02/17 who ordered an event monitor that did show some brief runs of PAF.     She did have a VATS procedure by Dr. Roxan Ramirez revealing a mass which was nonmalignant.  Coronary CTA performed 12/10/2017 revealing a coronary calcium score 125 with minimal CAD.     Since I saw her a year ago she is done well.  She gets occasional atypical chest pain.  Her most recent lipid profile performed 06/15/2022 revealed total cholesterol 164, LDL 77 and HDL 68.  Current Meds  Medication Sig   acetaminophen (TYLENOL) 325 MG tablet Take 1-2 tablets (325-650 mg total) by mouth every 4 (four) hours as needed for mild pain.   albuterol (VENTOLIN HFA) 108 (90 Base) MCG/ACT inhaler INHALE 1 OR 2 PUFFS INTO THE LUNGS EVERY 6 HOURS AS NEEDED FOR WHEEZING OR SHORTNESS OF BREATH   alendronate (FOSAMAX) 70 MG tablet Take by mouth.   Apoaequorin (PREVAGEN PO) Take 1 tablet by mouth  daily.   aspirin-acetaminophen-caffeine (EXCEDRIN MIGRAINE) 250-250-65 MG tablet Take 1 tablet by mouth every 6 (six) hours as needed for headache.   benzonatate (TESSALON PERLES) 100 MG capsule Take 1 capsule (100 mg total) by mouth 3 (three) times daily as needed.   Budeson-Glycopyrrol-Formoterol (BREZTRI AEROSPHERE) 160-9-4.8 MCG/ACT AERO Inhale 2 puffs into the lungs in the morning and at bedtime.   Calcium Carbonate-Vitamin D 600-400 MG-UNIT tablet Take 1 tablet by mouth daily.   carbidopa-levodopa (SINEMET IR) 25-100 MG tablet 2 at 7am, 2 at 11am, 2 at 4pm   Carboxymethylcellul-Glycerin 1-0.9 % GEL Place 1 application into both eyes at bedtime.   Co-Enzyme Q-10 100 MG CAPS Take 100 mg by mouth daily.    divalproex (DEPAKOTE ER) 250 MG 24 hr tablet SMARTSIG:1 Tablet(s) By Mouth   divalproex (DEPAKOTE ER) 500 MG 24 hr tablet SMARTSIG:1 Tablet(s) By Mouth   Eyelid Cleansers (STERILID) FOAM Apply topically.   famotidine (PEPCID) 20 MG tablet TAKE ONE TABLET BY MOUTH DAILY   fluocinonide ointment (LIDEX) 4.09 % Apply 1 application topically See admin instructions. Apply topically twice daily for 5 days alternating with Tacrolimus ointment   fluticasone (FLONASE) 50 MCG/ACT nasal spray USE 1 SPRAY IN EACH NOSTRIL MID-DAY FOR CONGESTION   fluticasone (FLOVENT HFA) 110 MCG/ACT inhaler    ipratropium (ATROVENT) 0.03 % nasal spray Place 2 sprays into both nostrils every 4 (four) hours as needed for rhinitis.   linaclotide (  LINZESS) 145 MCG CAPS capsule Take 1 capsule (145 mcg total) by mouth daily before breakfast. [id number], Exp: 01-2023   loratadine (CLARITIN) 10 MG tablet Take 10 mg by mouth daily.   MALIC ACID PO Take 1 capsule by mouth at bedtime.   metoprolol tartrate (LOPRESSOR) 25 MG tablet Take 0.5 tablets (12.5 mg total) by mouth 2 (two) times daily.   mirabegron ER (MYRBETRIQ) 50 MG TB24 tablet Take 50 mg by mouth daily as needed.   Misc. Devices (ROLLER WALKER) MISC Rolling walker with seat.  Dx:G20, R26.81   montelukast (SINGULAIR) 5 MG chewable tablet Chew by mouth.   naproxen sodium (ALEVE) 220 MG tablet Take 220 mg by mouth daily as needed.    Olopatadine HCl 0.2 % SOLN Apply to eye 2 (two) times daily as needed.   Omega-3 Fatty Acids (FISH OIL) 1000 MG CAPS Take 1,000 mg by mouth at bedtime.    pravastatin (PRAVACHOL) 40 MG tablet Take 1 tablet (40 mg total) by mouth daily.   sodium chloride (OCEAN) 0.65 % SOLN nasal spray Place 1 spray into both nostrils as needed for congestion. (Patient taking differently: Place 1 spray into both nostrils 4 (four) times daily as needed for congestion.)   tacrolimus (PROTOPIC) 0.1 % ointment Apply 1 application topically 2 (two) times daily as needed (PRN skin issues). (Patient taking differently: Apply 1 application  topically See admin instructions. Apply topically twice daily for 5 days alternating with Fluocinonide ointment)   Thiamine HCl (VITAMIN B-1) 100 MG tablet Take 100 mg by mouth daily.   triamcinolone ointment (KENALOG) 0.5 % Apply 1 application topically 2 (two) times daily. For rash on left lower leg   [DISCONTINUED] ezetimibe (ZETIA) 10 MG tablet Take 1 tablet (10 mg total) by mouth daily.     Allergies  Allergen Reactions   Mold Extract [Trichophyton Mentagrophyte] Shortness Of Breath and Rash   Penicillins Shortness Of Breath, Rash and Other (See Comments)    Eyes puffy Has taken low dose pcn and no rx REACTION: rash, SOB Has patient had a PCN reaction causing immediate rash, facial/tongue/throat swelling, SOB or lightheadedness with hypotension: yes Has patient had a PCN reaction causing severe rash involving mucus membranes or skin necrosis: unk Has patient had a PCN reaction that required hospitalization: no Has patient had a PCN reaction occurring within the last 10 years: unk If all of the above answers are "NO", then may proceed with Cephalospor   Morphine Other (See Comments)    REACTION: tachycardia and anxiety    Peanut Oil Nausea And Vomiting    Peanut butter   Protonix [Pantoprazole Sodium] Nausea And Vomiting   Atorvastatin     MYAGLIAS   Citrus Other (See Comments)    Unknown   Peanut-Containing Drug Products Other (See Comments)    Unknown   Rosuvastatin     MYALGIAS   Tramadol Other (See Comments)    Makes crazy;confused   Valium [Diazepam] Other (See Comments)    Confusion per family   Cetirizine Rash and Other (See Comments)    Around face   Codeine Other (See Comments)    REACTION: dizzy and "groggy in my head"   Eggs Or Egg-Derived Products Nausea And Vomiting   Latex Itching   Pentazocine Lactate Palpitations    Social History   Socioeconomic History   Marital status: Married    Spouse name: Not on file   Number of children: 2   Years of education: Not on file  Highest education level: Not on file  Occupational History   Occupation: retired    Fish farm manager: [employer]  Tobacco Use   Smoking status: Never   Smokeless tobacco: Never  Vaping Use   Vaping Use: Never used  Substance and Sexual Activity   Alcohol use: Never    Alcohol/week: 0.0 standard drinks of alcohol   Drug use: Never   Sexual activity: Not Currently  Other Topics Concern   Not on file  Social History Narrative   Right handed   One story home   Slight caffeine   Lives with husband   Social Determinants of Health   Financial Resource Strain: Low Risk  (09/02/2020)   Overall Financial Resource Strain (CARDIA)    Difficulty of Paying Living Expenses: Not hard at all  Food Insecurity: No Food Insecurity (09/02/2020)   Hunger Vital Sign    Worried About Running Out of Food in the Last Year: Never true    Ran Out of Food in the Last Year: Never true  Transportation Needs: No Transportation Needs (09/02/2020)   PRAPARE - Hydrologist (Medical): No    Lack of Transportation (Non-Medical): No  Physical Activity: Sufficiently Active (09/02/2020)    Exercise Vital Sign    Days of Exercise per Week: 5 days    Minutes of Exercise per Session: 30 min  Stress: No Stress Concern Present (09/02/2020)   [place]    Feeling of Stress : Not at all  Social Connections: Unknown (12/30/2017)   Social Connection and Isolation Panel [NHANES]    Frequency of Communication with Friends and Family: More than three times a week    Frequency of Social Gatherings with Friends and Family: More than three times a week    Attends Religious Services: Not asked    Active Member of Clubs or Organizations: Not on file    Attends Archivist Meetings: Not on file    Marital Status: Married  Intimate Partner Violence: Not At Risk (12/30/2017)   Humiliation, Afraid, Rape, and Kick questionnaire    Fear of Current or Ex-Partner: No    Emotionally Abused: No    Physically Abused: No    Sexually Abused: No     Review of Systems: General: negative for chills, fever, night sweats or weight changes.  Cardiovascular: negative for chest pain, dyspnea on exertion, edema, orthopnea, palpitations, paroxysmal nocturnal dyspnea or shortness of breath Dermatological: negative for rash Respiratory: negative for cough or wheezing Urologic: negative for hematuria Abdominal: negative for nausea, vomiting, diarrhea, bright red blood per rectum, melena, or hematemesis Neurologic: negative for visual changes, syncope, or dizziness All other systems reviewed and are otherwise negative except as noted above.    Blood pressure 110/66, pulse 61, height '5\' 5"'$  (1.651 m), weight 128 lb (58.1 kg), SpO2 99 %.  General appearance: alert and no distress Neck: no adenopathy, no carotid bruit, no JVD, supple, symmetrical, trachea midline, and thyroid not enlarged, symmetric, no tenderness/mass/nodules Lungs: clear to auscultation bilaterally Heart: regular rate and rhythm, S1, S2 normal, no murmur, click, rub or  gallop Extremities: extremities normal, atraumatic, no cyanosis or edema Pulses: 2+ and symmetric Skin: Skin color, texture, turgor normal. No rashes or lesions Neurologic: Grossly normal  EKG sinus rhythm at 61 with nonspecific ST and T wave changes.  Personally reviewed this EKG.  ASSESSMENT AND PLAN:   Hyperlipidemia History of hyperlipidemia on statin therapy lipid profile  performed 06/15/2022 revealing total cholesterol 164, LDL of 77 and HDL of 68.  Elevated coronary artery calcium score Coronary calcium score performed 12/10/2017 was 125.  They did notice a left lower lobe nodule.  She ultimately had a VATS procedure by Dr. Roxan Ramirez.  The pathology was benign.     Kara Harp MD FACP,FACC,FAHA, [facility] 09/10/2022 10:07 AM

## 2022-09-10 NOTE — Assessment & Plan Note (Signed)
History of hyperlipidemia on statin therapy lipid profile performed 06/15/2022 revealing total cholesterol 164, LDL of 77 and HDL of 68.

## 2022-09-10 NOTE — Patient Instructions (Signed)
Medication Instructions:  Your physician recommends that you continue on your current medications as directed. Please refer to the Current Medication list given to you today.  *If you need a refill on your cardiac medications before your next appointment, please call your pharmacy*   Follow-Up: At [facility], you and your health needs are our priority.  As part of our continuing mission to provide you with exceptional heart care, we have created designated Provider Care Teams.  These Care Teams include your primary Cardiologist (physician) and Advanced Practice Providers (APPs -  Physician Assistants and Nurse Practitioners) who all work together to provide you with the care you need, when you need it.  We recommend signing up for the patient portal called "MyChart".  Sign up information is provided on this After Visit Summary.  MyChart is used to connect with patients for Virtual Visits (Telemedicine).  Patients are able to view lab/test results, encounter notes, upcoming appointments, etc.  Non-urgent messages can be sent to your provider as well.   To learn more about what you can do with MyChart, go to [email or web address].    Your next appointment:   12 month(s)  The format for your next appointment:   In Person  Provider:   Jonathan Berry, MD   

## 2022-09-14 ENCOUNTER — Other Ambulatory Visit: Payer: Self-pay

## 2022-09-14 ENCOUNTER — Ambulatory Visit: Payer: PPO | Admitting: Internal Medicine

## 2022-09-14 ENCOUNTER — Encounter: Payer: Self-pay | Admitting: Internal Medicine

## 2022-09-14 VITALS — BP 116/68 | Temp 97.2°F | Resp 16 | Ht 65.0 in | Wt 128.0 lb

## 2022-09-14 DIAGNOSIS — J3089 Other allergic rhinitis: Secondary | ICD-10-CM

## 2022-09-14 DIAGNOSIS — J019 Acute sinusitis, unspecified: Secondary | ICD-10-CM | POA: Diagnosis not present

## 2022-09-14 DIAGNOSIS — J4530 Mild persistent asthma, uncomplicated: Secondary | ICD-10-CM | POA: Diagnosis not present

## 2022-09-14 DIAGNOSIS — B9689 Other specified bacterial agents as the cause of diseases classified elsewhere: Secondary | ICD-10-CM

## 2022-09-14 DIAGNOSIS — J302 Other seasonal allergic rhinitis: Secondary | ICD-10-CM

## 2022-09-14 DIAGNOSIS — J329 Chronic sinusitis, unspecified: Secondary | ICD-10-CM

## 2022-09-14 MED ORDER — FLUTICASONE PROPIONATE 50 MCG/ACT NA SUSP: 2.0000 | Freq: Every day | NASAL | 5 refills | Status: DC

## 2022-09-14 MED ORDER — LORATADINE 10 MG PO TABS: 10.0000 mg | ORAL_TABLET | Freq: Every day | ORAL | 5 refills | Status: DC

## 2022-09-14 MED ORDER — BREZTRI AEROSPHERE 160-9-4.8 MCG/ACT IN AERO: 2.0000 | INHALATION_SPRAY | Freq: Two times a day (BID) | RESPIRATORY_TRACT | 5 refills | Status: DC

## 2022-09-14 MED ORDER — CEFDINIR 300 MG PO CAPS: 300.0000 mg | ORAL_CAPSULE | Freq: Two times a day (BID) | ORAL | 0 refills | Status: AC

## 2022-09-14 MED ORDER — ALBUTEROL SULFATE HFA 108 (90 BASE) MCG/ACT IN AERS: INHALATION_SPRAY | RESPIRATORY_TRACT | 1 refills | Status: DC

## 2022-09-14 MED ORDER — AZELASTINE HCL 0.1 % NA SOLN: 1.0000 | Freq: Two times a day (BID) | NASAL | 5 refills | Status: DC

## 2022-09-14 NOTE — Patient Instructions (Addendum)
Rhinitis: - Start Omnicef '300mg'$  twice daily for 7 days.  - Use nasal saline rinses before nose sprays such as with Neilmed Sinus Rinse.  Use distilled water.   - Use Flonase 2 sprays each nostril daily. Aim upward and outward. - Use Azelastine 1-2 sprays each nostril twice daily. Aim upward and outward. - Use Claritin 10 mg daily.   Asthma: Daily controller medication(s):  continue Breztri 156mg 2 puffs twice a day with spacer and rinse mouth afterwards May use albuterol rescue inhaler 2 puffs every 4 to 6 hours as needed for shortness of breath, chest tightness, coughing, and wheezing. May use albuterol rescue inhaler 2 puffs 5 to 15 minutes prior to strenuous physical activities. Monitor frequency of use.  Asthma control goals:  Full participation in all desired activities (may need albuterol before activity) Albuterol use two times or less a week on average (not counting use with activity) Cough interfering with sleep two times or less a month Oral steroids no more than once a year No hospitalizations
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
## 2022-09-28 ENCOUNTER — Ambulatory Visit: Payer: PPO | Admitting: Occupational Therapy

## 2022-09-28 ENCOUNTER — Ambulatory Visit: Payer: PPO | Admitting: Speech Pathology

## 2022-10-05 ENCOUNTER — Telehealth: Payer: Self-pay | Admitting: Gastroenterology

## 2022-10-05 NOTE — Telephone Encounter (Signed)
Inbound call from patient stating it hurts to pass a bowel movement and she is having difficulty trying to pass one. Patient is requesting a call back to further advise.

## 2022-10-05 NOTE — Telephone Encounter (Signed)
I'd recommend fleet enema x 2 to see if this can help her pain any retained stools and see how she does. Can try some Calmol4 suppositories over the counter for hemorrhoids. Thanks

## 2022-10-05 NOTE — Telephone Encounter (Signed)
Spoke with pt and she is aware of Dr. Ozella Rocks recommendations. She knows to call back if she is still having issues. Discussed with pt that she can try recticare otc also for discomfort.

## 2022-10-05 NOTE — Telephone Encounter (Signed)
Pt states that she is taking linzess. She had a sinus infection and just finished a course of omnicef. Pt reports she is passing brownish orange liquid and she has gone so much she feels bumps on her rectum. DIscussed with her she could have hemorrhoids. Pt states she has been to PT for pelvic floor and was shown how to insert your finger to help pass stool. She tried this and said she feels some hard stool but she did not do anything further because it hurts to bad, reports this is very painful. She did have some liquid stool that came out and she soiled her clothes yesterday. ? Impaction with her feeling the hard stool and not being able to pass it. Pt wants to know what Dr. Havery Moros recommends. Please advise.

## 2022-10-05 NOTE — Telephone Encounter (Signed)
Inbound call from patient stating she is having extreme diarrhea and is also experiencing bumps around her anal area that has became uncomfortable. Please advise at your earliest convince.  Thank you

## 2022-10-08 NOTE — Telephone Encounter (Signed)
Spoke with pt and she is aware of Dr. Doyne Keel recommendations.

## 2022-10-08 NOTE — Telephone Encounter (Signed)
Pt states she did the 2 enemas that she was told to do on Friday. She passed quite a bit of stool but she continues to pass stool and is incontinent. Reports she is passing thick orange/brown liquid and it comes out and she cannot stop it. Scheduled her for first available appt 10/23/22 at 3:20pm with Dr. Havery Moros but pt wants to know what she can do in the meantime. Please advise.

## 2022-10-08 NOTE — Telephone Encounter (Signed)
Inbound call from patient stating that she tried the enema's and that made her issue worse. Patient stated that she can't move without having to use the bathroom. Patient is requesting a call back to discuss. Please advise.

## 2022-10-08 NOTE — Telephone Encounter (Signed)
If she was constipated before this she may just have a lot of stool to purge / pass before this resolves. She can try a few more enemas to make sure she is cleared out from below, and additional Miralax if needed. I would not use immodium as that could cause another possible impaction / constipation. If she took otherwise took a lot of oral laxatives already she may just want to monitor and let this pass over the next 1-2 days

## 2022-10-16 ENCOUNTER — Other Ambulatory Visit: Payer: Self-pay

## 2022-10-16 ENCOUNTER — Encounter: Payer: Self-pay | Admitting: Internal Medicine

## 2022-10-16 ENCOUNTER — Ambulatory Visit: Payer: PPO | Admitting: Internal Medicine

## 2022-10-16 VITALS — BP 124/82 | HR 66 | Temp 98.4°F | Resp 20 | Ht 63.78 in | Wt 125.5 lb

## 2022-10-16 DIAGNOSIS — J4530 Mild persistent asthma, uncomplicated: Secondary | ICD-10-CM | POA: Diagnosis not present

## 2022-10-16 DIAGNOSIS — G20C Parkinsonism, unspecified: Secondary | ICD-10-CM | POA: Diagnosis not present

## 2022-10-16 DIAGNOSIS — R2681 Unsteadiness on feet: Secondary | ICD-10-CM | POA: Diagnosis not present

## 2022-10-16 DIAGNOSIS — J302 Other seasonal allergic rhinitis: Secondary | ICD-10-CM

## 2022-10-16 DIAGNOSIS — J3089 Other allergic rhinitis: Secondary | ICD-10-CM

## 2022-10-16 DIAGNOSIS — J329 Chronic sinusitis, unspecified: Secondary | ICD-10-CM

## 2022-10-16 MED ORDER — ALBUTEROL SULFATE HFA 108 (90 BASE) MCG/ACT IN AERS: INHALATION_SPRAY | RESPIRATORY_TRACT | 1 refills | Status: DC

## 2022-10-16 MED ORDER — FLUTICASONE PROPIONATE 50 MCG/ACT NA SUSP: 2.0000 | Freq: Every day | NASAL | 5 refills | Status: DC

## 2022-10-16 MED ORDER — MONTELUKAST SODIUM 5 MG PO CHEW: 10.0000 mg | CHEWABLE_TABLET | Freq: Every day | ORAL | 5 refills | Status: DC

## 2022-10-16 MED ORDER — IPRATROPIUM BROMIDE 0.03 % NA SOLN: 2.0000 | Freq: Three times a day (TID) | NASAL | 5 refills | Status: DC | PRN

## 2022-10-16 MED ORDER — BREZTRI AEROSPHERE 160-9-4.8 MCG/ACT IN AERO: 2.0000 | INHALATION_SPRAY | Freq: Two times a day (BID) | RESPIRATORY_TRACT | 5 refills | Status: DC

## 2022-10-16 MED ORDER — AZELASTINE HCL 0.1 % NA SOLN: 1.0000 | Freq: Two times a day (BID) | NASAL | 5 refills | Status: DC
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
## 2022-10-16 NOTE — Patient Instructions (Signed)
Rhinitis: - Use nasal saline rinses before nose sprays such as with Neilmed Sinus Rinse.  Use distilled water.   - Use Flonase 2 sprays each nostril daily. Aim upward and outward. - Use Azelastine 1-2 sprays each nostril twice daily. Aim upward and outward. - Use Ipratropium 1-2 sprays up to three times daily for runny nose.  Can cause a lot of dryness.  - Use Claritin 10 mg daily.  - Use Singulair '10mg'$  daily.   Asthma: Daily controller medication(s):   Continue Breztri 158mg 2 puffs twice a day with spacer and rinse mouth afterwards.  Continue Singulair '10mg'$  daily. May use albuterol rescue inhaler 2 puffs every 4 to 6 hours as needed for shortness of breath, chest tightness, coughing, and wheezing. May use albuterol rescue inhaler 2 puffs 5 to 15 minutes prior to strenuous physical activities. Monitor frequency of use.  Asthma control goals:  Full participation in all desired activities (may need albuterol before activity) Albuterol use two times or less a week on average (not counting use with activity) Cough interfering with sleep two times or less a month Oral steroids no more than once a year No hospitalizations

## 2022-10-23 ENCOUNTER — Ambulatory Visit: Payer: PPO | Admitting: Gastroenterology

## 2022-11-23 ENCOUNTER — Other Ambulatory Visit: Payer: Self-pay | Admitting: Gastroenterology

## 2022-12-12 ENCOUNTER — Telehealth: Payer: Self-pay | Admitting: Internal Medicine

## 2022-12-12 NOTE — Telephone Encounter (Signed)
Patient called and said that she is sick coughing, nose stop up stuff and a low grade fever, clear mucus. Had it for about a week [facility] [phone]

## 2022-12-12 NOTE — Telephone Encounter (Signed)
Patient called back. Advised her of message from [place]. Patient verbalized understanding.

## 2022-12-26 DIAGNOSIS — Z01419 Encounter for gynecological examination (general) (routine) without abnormal findings: Secondary | ICD-10-CM | POA: Diagnosis not present

## 2022-12-26 DIAGNOSIS — Z1231 Encounter for screening mammogram for malignant neoplasm of breast: Secondary | ICD-10-CM | POA: Diagnosis not present

## 2022-12-26 DIAGNOSIS — N958 Other specified menopausal and perimenopausal disorders: Secondary | ICD-10-CM | POA: Diagnosis not present

## 2022-12-26 DIAGNOSIS — M816 Localized osteoporosis [Lequesne]: Secondary | ICD-10-CM | POA: Diagnosis not present

## 2022-12-26 DIAGNOSIS — R29890 Loss of height: Secondary | ICD-10-CM | POA: Diagnosis not present

## 2022-12-26 DIAGNOSIS — Z6820 Body mass index (BMI) 20.0-20.9, adult: Secondary | ICD-10-CM | POA: Diagnosis not present

## 2022-12-26 LAB — HM DEXA SCAN

## 2022-12-31 DIAGNOSIS — M810 Age-related osteoporosis without current pathological fracture: Secondary | ICD-10-CM | POA: Diagnosis not present

## 2023-01-01 ENCOUNTER — Other Ambulatory Visit: Payer: Self-pay

## 2023-01-03 ENCOUNTER — Telehealth: Payer: Self-pay | Admitting: Pharmacy Technician

## 2023-01-03 NOTE — Telephone Encounter (Signed)
Dr. Matthew Saras, [facility] [place]ier note:  Auth Submission: NO AUTH NEEDED Payer: [employer] Medication & CPT/J Code(s) submitted: Prolia (Denosumab) [phone] Route of submission (phone, fax, portal):  Phone # Fax # Auth type: Buy/Bill Units/visits requested: 2 Reference number: [id number] Approval from: 01/03/23 to 11/19/23

## 2023-01-09 ENCOUNTER — Encounter: Payer: Self-pay | Admitting: Speech Pathology

## 2023-01-09 ENCOUNTER — Other Ambulatory Visit: Payer: Self-pay

## 2023-01-09 ENCOUNTER — Ambulatory Visit: Payer: PPO | Attending: Internal Medicine | Admitting: Speech Pathology

## 2023-01-09 DIAGNOSIS — R471 Dysarthria and anarthria: Secondary | ICD-10-CM | POA: Diagnosis not present

## 2023-01-09 NOTE — Patient Instructions (Signed)
   Eliminate throat clears - this is not good for your throat - you can do this  When you feel like you need to clear your throat, swallow hard or take a sip of water  Speak Out! Workbook should be arriving in a week or 2 - bring this to therapy with you each time  Start out with 5 loud AH's and reading with strong volume 10 sentences twice a day
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
## 2023-01-11 ENCOUNTER — Ambulatory Visit (INDEPENDENT_AMBULATORY_CARE_PROVIDER_SITE_OTHER): Payer: PPO | Admitting: *Deleted

## 2023-01-11 ENCOUNTER — Other Ambulatory Visit: Payer: PPO

## 2023-01-11 VITALS — BP 113/71 | HR 69 | Temp 97.4°F | Resp 16 | Ht 66.0 in | Wt 121.6 lb

## 2023-01-11 DIAGNOSIS — M810 Age-related osteoporosis without current pathological fracture: Secondary | ICD-10-CM | POA: Diagnosis not present

## 2023-01-11 MED ORDER — DENOSUMAB 60 MG/ML ~~LOC~~ SOSY
60.0000 mg | PREFILLED_SYRINGE | Freq: Once | SUBCUTANEOUS | Status: AC
  Administered 2023-01-11: 60 mg via SUBCUTANEOUS
  Filled 2023-01-11: qty 1

## 2023-01-11 NOTE — Progress Notes (Signed)
Diagnosis: Osteoporosis  Provider:  Marshell Garfinkel MD  Procedure: Injection  Prolia (Denosumab), Dose: 60 mg, Site: subcutaneous, Number of injections: 1  Post Care: Observation period completed  Discharge: Condition: Good, Destination: Home . AVS Provided  Performed by:  Oren Beckmann, RN

## 2023-01-14 ENCOUNTER — Ambulatory Visit: Payer: PPO | Admitting: Speech Pathology

## 2023-01-14 DIAGNOSIS — R471 Dysarthria and anarthria: Secondary | ICD-10-CM
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
## 2023-01-16 ENCOUNTER — Ambulatory Visit: Payer: PPO | Admitting: Speech Pathology

## 2023-01-16 ENCOUNTER — Telehealth: Payer: Self-pay | Admitting: Gastroenterology

## 2023-01-16 DIAGNOSIS — R471 Dysarthria and anarthria: Secondary | ICD-10-CM | POA: Diagnosis not present

## 2023-01-16 NOTE — Telephone Encounter (Signed)
Inbound call from patient requesting a call back from nurse to discuss constipation issues she is having now. Please advise.
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
## 2023-01-16 NOTE — Telephone Encounter (Signed)
Called and spoke with patient. Pt states that she passed a tiny bowel movement last night and this morning. Pt states that she has continued taking her Linzess 145 mcg daily but does not feel like it is working well anymore. Pt states that she can feel a hard stool at her rectum, but it won't pass. Pt does not feel like she is emptying her bowels completely I encouraged patient to try an OTC fleet enema to pass any hard stool that may be near the rectum. I informed patient that there is a higher dose of Linzess or she may need to add Miralax to her regimen. Pt will start out with the enema and will await further recommendations. Pt also wanted to go ahead and schedule her yearly follow up with Dr. Havery Moros, she is scheduled for an appt on Friday, 03/29/23 at 2:10 pm. Please advise, thanks.

## 2023-01-17 NOTE — Telephone Encounter (Signed)
Lm on vm for patient to return call 

## 2023-01-17 NOTE — Telephone Encounter (Signed)
Pt returned call. We have reviewed Dr. Doyne Keel recommendations as outlined below. Pt states that she has been passing liquid stool since last night, she was not able to get the enema. I informed patient that she could be having overflow diarrhea, I encouraged patient to have someone bring her a couple of enemas to use over the next day or two. Pt states that she can add Miralax to her current regimen of Linzess 145 mcg daily. Pt knows to use the enemas first, pt has been advised that it can take a few days for her to pass all of the stool. Pt states that she just feels bad, almost like she has the flu. I informed the patient that she could be losing electrolytes and should be pushing fluids. Pt states that she has only been drinking water since she has a lot of allergies, I informed patient that she really should be drinking fluids that replace her electrolytes like Pedialyte or Gatorade. Pt will call with an update on Monday, she knows to contact the office over the weekend if she has any immediate concerns. Pt was thankful for the return call.

## 2023-01-17 NOTE — Telephone Encounter (Signed)
Agree. She should try using some enemas with her current symptoms. Can use fleet enema or mineral oil enemas. Can try increasing Linzess to 247mg dose and / or add Miralax to her regimen, but should use enemas first to clear out stool in her rectum. If not helping she should let [place]. Thanks

## 2023-01-21 ENCOUNTER — Ambulatory Visit: Payer: PPO | Attending: Internal Medicine | Admitting: Speech Pathology

## 2023-01-21 DIAGNOSIS — R41841 Cognitive communication deficit: Secondary | ICD-10-CM | POA: Diagnosis not present

## 2023-01-21 DIAGNOSIS — R471 Dysarthria and anarthria: Secondary | ICD-10-CM | POA: Diagnosis not present
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
## 2023-01-28 ENCOUNTER — Encounter: Payer: Self-pay | Admitting: Speech Pathology

## 2023-01-28 ENCOUNTER — Ambulatory Visit: Payer: PPO | Admitting: Speech Pathology

## 2023-01-28 DIAGNOSIS — R471 Dysarthria and anarthria: Secondary | ICD-10-CM | POA: Diagnosis not present

## 2023-01-28 DIAGNOSIS — R41841 Cognitive communication deficit: Secondary | ICD-10-CM
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
## 2023-01-30 ENCOUNTER — Encounter: Payer: Self-pay | Admitting: Speech Pathology

## 2023-01-30 ENCOUNTER — Ambulatory Visit: Payer: PPO | Admitting: Speech Pathology

## 2023-01-30 DIAGNOSIS — R471 Dysarthria and anarthria: Secondary | ICD-10-CM | POA: Diagnosis not present

## 2023-01-30 DIAGNOSIS — R41841 Cognitive communication deficit: Secondary | ICD-10-CM
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
## 2023-02-04 ENCOUNTER — Ambulatory Visit: Payer: PPO

## 2023-02-04 DIAGNOSIS — R41841 Cognitive communication deficit: Secondary | ICD-10-CM

## 2023-02-04 DIAGNOSIS — R471 Dysarthria and anarthria: Secondary | ICD-10-CM | POA: Diagnosis not present
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
## 2023-02-06 ENCOUNTER — Ambulatory Visit: Payer: PPO | Admitting: Speech Pathology

## 2023-02-06 DIAGNOSIS — R41841 Cognitive communication deficit: Secondary | ICD-10-CM

## 2023-02-06 DIAGNOSIS — R471 Dysarthria and anarthria: Secondary | ICD-10-CM
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
## 2023-02-08 DIAGNOSIS — H532 Diplopia: Secondary | ICD-10-CM | POA: Diagnosis not present

## 2023-02-08 DIAGNOSIS — B300 Keratoconjunctivitis due to adenovirus: Secondary | ICD-10-CM | POA: Diagnosis not present

## 2023-02-08 DIAGNOSIS — S069X9A Unspecified intracranial injury with loss of consciousness of unspecified duration, initial encounter: Secondary | ICD-10-CM | POA: Diagnosis not present

## 2023-02-18 ENCOUNTER — Other Ambulatory Visit: Payer: Self-pay

## 2023-02-18 ENCOUNTER — Ambulatory Visit (INDEPENDENT_AMBULATORY_CARE_PROVIDER_SITE_OTHER): Payer: PPO | Admitting: Internal Medicine

## 2023-02-18 ENCOUNTER — Encounter: Payer: Self-pay | Admitting: Internal Medicine

## 2023-02-18 ENCOUNTER — Ambulatory Visit: Payer: PPO | Attending: Internal Medicine | Admitting: Speech Pathology

## 2023-02-18 VITALS — BP 96/70 | HR 66 | Temp 98.1°F | Wt 118.9 lb

## 2023-02-18 DIAGNOSIS — J3089 Other allergic rhinitis: Secondary | ICD-10-CM

## 2023-02-18 DIAGNOSIS — J329 Chronic sinusitis, unspecified: Secondary | ICD-10-CM

## 2023-02-18 DIAGNOSIS — R41841 Cognitive communication deficit: Secondary | ICD-10-CM | POA: Insufficient documentation

## 2023-02-18 DIAGNOSIS — J4530 Mild persistent asthma, uncomplicated: Secondary | ICD-10-CM | POA: Diagnosis not present

## 2023-02-18 DIAGNOSIS — R471 Dysarthria and anarthria: Secondary | ICD-10-CM | POA: Insufficient documentation

## 2023-02-18 DIAGNOSIS — J302 Other seasonal allergic rhinitis: Secondary | ICD-10-CM

## 2023-02-18 MED ORDER — ALBUTEROL SULFATE HFA 108 (90 BASE) MCG/ACT IN AERS: INHALATION_SPRAY | RESPIRATORY_TRACT | 1 refills | Status: DC

## 2023-02-18 MED ORDER — AZELASTINE HCL 0.1 % NA SOLN: 1.0000 | Freq: Two times a day (BID) | NASAL | 5 refills | Status: DC

## 2023-02-18 MED ORDER — LORATADINE 10 MG PO TABS: 10.0000 mg | ORAL_TABLET | Freq: Every day | ORAL | 5 refills | Status: DC

## 2023-02-18 MED ORDER — FLUTICASONE PROPIONATE 50 MCG/ACT NA SUSP: 2.0000 | Freq: Every day | NASAL | 5 refills | Status: DC

## 2023-02-18 MED ORDER — IPRATROPIUM BROMIDE 0.03 % NA SOLN: 1.0000 | Freq: Three times a day (TID) | NASAL | 5 refills | Status: DC | PRN

## 2023-02-18 MED ORDER — MONTELUKAST SODIUM 5 MG PO CHEW: 10.0000 mg | CHEWABLE_TABLET | Freq: Every day | ORAL | 5 refills | Status: DC

## 2023-02-18 MED ORDER — BREZTRI AEROSPHERE 160-9-4.8 MCG/ACT IN AERO: 2.0000 | INHALATION_SPRAY | Freq: Two times a day (BID) | RESPIRATORY_TRACT | 5 refills | Status: DC
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
## 2023-02-18 NOTE — Patient Instructions (Addendum)
Allergic Rhinitis: - Use nasal saline rinses before nose sprays such as with Neilmed Sinus Rinse.  Use distilled water.   - Use Flonase 2 sprays each nostril daily. Aim upward and outward. - Use Azelastine 1-2 sprays each nostril twice daily. Aim upward and outward. - Use Ipratropium 1-2 sprays up to three times daily for runny nose.  Can cause a lot of dryness.  - Use Claritin 10 mg daily.  - Use Singulair 10mg  daily.   Recurrent Rhinosinusitis - We will refer you to ENT for further evaluation.   Mild Persistent Asthma: Daily controller medication(s):   Continue Breztri 139mcg 2 puffs twice a day with spacer and rinse mouth afterwards.  Continue Singulair 10mg  daily. May use albuterol rescue inhaler 2 puffs every 4 to 6 hours as needed for shortness of breath, chest tightness, coughing, and wheezing. May use albuterol rescue inhaler 2 puffs 5 to 15 minutes prior to strenuous physical activities. Monitor frequency of use.  Asthma control goals:  Full participation in all desired activities (may need albuterol before activity) Albuterol use two times or less a week on average (not counting use with activity) Cough interfering with sleep two times or less a month Oral steroids no more than once a year No hospitalizations

## 2023-02-20 ENCOUNTER — Ambulatory Visit: Payer: PPO | Admitting: Speech Pathology

## 2023-02-20 ENCOUNTER — Encounter: Payer: Self-pay | Admitting: Speech Pathology

## 2023-02-20 DIAGNOSIS — R471 Dysarthria and anarthria: Secondary | ICD-10-CM

## 2023-02-20 DIAGNOSIS — R41841 Cognitive communication deficit: Secondary | ICD-10-CM
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
## 2023-02-21 DIAGNOSIS — H4912 Fourth [trochlear] nerve palsy, left eye: Secondary | ICD-10-CM | POA: Diagnosis not present

## 2023-02-21 DIAGNOSIS — S069X9A Unspecified intracranial injury with loss of consciousness of unspecified duration, initial encounter: Secondary | ICD-10-CM | POA: Diagnosis not present

## 2023-02-21 DIAGNOSIS — H532 Diplopia: Secondary | ICD-10-CM | POA: Diagnosis not present

## 2023-02-22 ENCOUNTER — Telehealth: Payer: Self-pay

## 2023-02-22 NOTE — Telephone Encounter (Signed)
Patient has been scheduled to see Dr Ernestene Kiel on 04/09/2023 @ 1:00 PM (Arrive 15 Mins Early)    [facility] [place] Formerly [facility]  [address] [place], [place] [address] Phone: [phone])  Patient is to bring Photo ID & Insurance Card.  Called and left a voicemail for the patient to discuss this information.

## 2023-02-22 NOTE — Telephone Encounter (Signed)
-----   Message from Birder Robson, MD sent at 02/18/2023 12:03 PM EDT ----- Hello I would like for her to see ENT please. Thank you.

## 2023-02-27 ENCOUNTER — Ambulatory Visit: Payer: PPO | Admitting: Speech Pathology

## 2023-02-27 ENCOUNTER — Encounter: Payer: Self-pay | Admitting: Speech Pathology

## 2023-02-27 DIAGNOSIS — R471 Dysarthria and anarthria: Secondary | ICD-10-CM | POA: Diagnosis not present

## 2023-02-27 DIAGNOSIS — R41841 Cognitive communication deficit: Secondary | ICD-10-CM
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
## 2023-03-01 ENCOUNTER — Telehealth: Payer: Self-pay | Admitting: Gastroenterology

## 2023-03-01 NOTE — Telephone Encounter (Signed)
Left patient a detailed vm with recommendations.

## 2023-03-01 NOTE — Telephone Encounter (Signed)
Called and spoke with patient. Pt states that she has been experiencing loose stools for the past week. Pt passes about 2-3 stools that vary in size. Pt describes as an "orange/brown color, sometimes darker". Pt states that she has been taking Linzess 145 mcg daily except for today. Pt has not started any new medications or changed her diet. Patient reports occasional bloating and gas. Pt took OTC anti-diarrheal today and anti-gas medication. Pt states that she has not had a BM since taking the anti-diarrheal. Patient states that she has been eating a normal diet. Pt states that she "she thinks that she has lost weight because he pants keep falling down". I am not sure if patient is having overflow diarrhea or true diarrhea. I scheduled pt for an OV with Hyacinth Meeker, PA-C next week to discuss change in bowel habits. I told pt I was not sure if her bowel regimen would need to be changed or if an x-ray would need to be done to see if she was actually constipated or not. Please advise on any recommendations prior to visit next week. Thanks

## 2023-03-01 NOTE — Telephone Encounter (Signed)
Agree with office visit. If she is having persistent loose stools in the setting of Linzess she should hold it for now. See how she does post immodium, can use that PRN in the interim. Thanks

## 2023-03-01 NOTE — Telephone Encounter (Signed)
Patient calling requesting to speak with a nurse she is having a bowels issues and want to know if there something she can take.Marland KitchenPlease advise

## 2023-03-04 ENCOUNTER — Ambulatory Visit: Payer: PPO

## 2023-03-04 DIAGNOSIS — R471 Dysarthria and anarthria: Secondary | ICD-10-CM

## 2023-03-04 DIAGNOSIS — R41841 Cognitive communication deficit: Secondary | ICD-10-CM
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
## 2023-03-05 ENCOUNTER — Telehealth: Payer: Self-pay | Admitting: Internal Medicine

## 2023-03-05 NOTE — Telephone Encounter (Signed)
Forwarding message to [employer] for update.

## 2023-03-05 NOTE — Telephone Encounter (Signed)
Patient states she has not heard anything about the referral that was put in for ENT. She would like a call back.

## 2023-03-06 ENCOUNTER — Ambulatory Visit: Payer: PPO | Admitting: Speech Pathology

## 2023-03-06 ENCOUNTER — Encounter: Payer: Self-pay | Admitting: Speech Pathology

## 2023-03-06 DIAGNOSIS — R471 Dysarthria and anarthria: Secondary | ICD-10-CM | POA: Diagnosis not present

## 2023-03-06 DIAGNOSIS — R41841 Cognitive communication deficit: Secondary | ICD-10-CM
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
## 2023-03-07 ENCOUNTER — Encounter: Payer: Self-pay | Admitting: Physician Assistant

## 2023-03-07 ENCOUNTER — Ambulatory Visit
Admission: RE | Admit: 2023-03-07 | Discharge: 2023-03-07 | Disposition: A | Discharge status: Home / Self Care | Payer: PPO | Source: Ambulatory Visit | Attending: Physician Assistant | Admitting: Physician Assistant

## 2023-03-07 ENCOUNTER — Ambulatory Visit: Payer: PPO | Admitting: Physician Assistant

## 2023-03-07 VITALS — BP 110/62 | HR 82 | Ht 63.0 in | Wt 117.0 lb

## 2023-03-07 DIAGNOSIS — K5900 Constipation, unspecified: Secondary | ICD-10-CM | POA: Diagnosis not present

## 2023-03-07 DIAGNOSIS — K5909 Other constipation: Secondary | ICD-10-CM
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
## 2023-03-07 NOTE — Progress Notes (Signed)
Agree with assessment and plan as outlined.  

## 2023-03-07 NOTE — Patient Instructions (Signed)
_______________________________________________________  If your blood pressure at your visit was 140/90 or greater, please contact your primary care physician to follow up on this.  _______________________________________________________  If you are age 73 or older, your body mass index should be between 23-30. Your Body mass index is 20.73 kg/m. If this is out of the aforementioned range listed, please consider follow up with your Primary Care Provider.  If you are age 65 or younger, your body mass index should be between 19-25. Your Body mass index is 20.73 kg/m. If this is out of the aformentioned range listed, please consider follow up with your Primary Care Provider.   ________________________________________________________  The [facility] providers would like to encourage you to use [facility] to communicate with providers for non-urgent requests or questions.  Due to long hold times on the telephone, sending your provider a message by [facility] may be a faster and more efficient way to get a response.  Please allow 48 business hours for a response.  Please remember that this is for non-urgent requests.  _______________________________________________________  Your provider has requested that you go to the basement level for lan ex raybefore leaving today. Press "B" on the elevator.

## 2023-03-11 ENCOUNTER — Ambulatory Visit: Payer: PPO | Admitting: Speech Pathology

## 2023-03-11 ENCOUNTER — Telehealth: Payer: Self-pay | Admitting: Physician Assistant

## 2023-03-11 NOTE — Telephone Encounter (Signed)
Called and left the patient another voicemail to give her the information on her appt with ENT.

## 2023-03-11 NOTE — Telephone Encounter (Signed)
Patient called to check on xray results.

## 2023-03-11 NOTE — Telephone Encounter (Signed)
I have called the patient twice and sent mychart message regarding the patient being scheduled. Please see telephone contact on 02/22/2023.

## 2023-03-12 NOTE — Telephone Encounter (Signed)
Left message for pt to call back. Do not see any results from an xray.  Called radiology as the xray has not been read yet. Per radiology they will have it read stat and should be back in about 20 min.

## 2023-03-12 NOTE — Telephone Encounter (Signed)
Inbound call from patient, states she is still experiencing loose stools, and is wondering if there are any other alternatives.

## 2023-03-12 NOTE — Telephone Encounter (Signed)
See additional message.

## 2023-03-12 NOTE — Telephone Encounter (Signed)
Patient returned call  Please advise 

## 2023-03-12 NOTE — Telephone Encounter (Signed)
Inbound call from patient, following up on note below.

## 2023-03-13 ENCOUNTER — Encounter: Payer: PPO | Admitting: Speech Pathology

## 2023-03-13 ENCOUNTER — Ambulatory Visit (INDEPENDENT_AMBULATORY_CARE_PROVIDER_SITE_OTHER)
Admission: RE | Admit: 2023-03-13 | Discharge: 2023-03-13 | Disposition: A | Discharge status: Home / Self Care | Payer: PPO | Source: Ambulatory Visit | Attending: Physician Assistant | Admitting: Physician Assistant

## 2023-03-13 DIAGNOSIS — K5909 Other constipation: Secondary | ICD-10-CM | POA: Diagnosis not present

## 2023-03-13 NOTE — Telephone Encounter (Signed)
Called and spoke with [facility] in the [facility] x-ray department. She informed me that there are no images in the system, it looks like patient did not have x-ray completed at our office. Lm on vm for patient to return call to further discuss.

## 2023-03-13 NOTE — Telephone Encounter (Signed)
Kara Ramirez called back from the x-ray dept. Patient did come in for x-ray. Images are now in epic. She requested that the results be read as STAT. Results should be available in 30 minutes. Pt called back and she is aware that we should be calling her back later today with results. Pt verbalized understanding and had no concerns at the end of the call.

## 2023-03-13 NOTE — Telephone Encounter (Signed)
I called and left the patient another voicemail. I am sending out a letter for her to contact us.

## 2023-03-18 ENCOUNTER — Encounter: Payer: PPO | Admitting: Speech Pathology

## 2023-03-20 ENCOUNTER — Encounter: Payer: PPO | Admitting: Speech Pathology

## 2023-03-29 ENCOUNTER — Encounter: Payer: Self-pay | Admitting: Gastroenterology

## 2023-03-29 ENCOUNTER — Ambulatory Visit (INDEPENDENT_AMBULATORY_CARE_PROVIDER_SITE_OTHER): Payer: PPO | Admitting: Gastroenterology

## 2023-03-29 VITALS — BP 116/60 | HR 68 | Ht 66.0 in | Wt 111.0 lb

## 2023-03-29 DIAGNOSIS — K5909 Other constipation: Secondary | ICD-10-CM
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
## 2023-03-29 NOTE — Patient Instructions (Signed)
Please purchase the following medications over the counter and take as directed: Citrucel-once daily  Miralax 1-2 capfuls daily (goal is to have 1 bowel movement daily). IB Gard-we have given you samples to take as needed for cramps. _______________________________________________________  Call our office in 1-2 weeks if you are not feeling any better after the above measures.  _______________________________________________________  If your blood pressure at your visit was 140/90 or greater, please contact your primary care physician to follow up on this.  _______________________________________________________  If you are age 73 or older, your body mass index should be between 23-30. Your Body mass index is 17.92 kg/m. If this is out of the aforementioned range listed, please consider follow up with your Primary Care Provider.  If you are age 68 or younger, your body mass index should be between 19-25. Your Body mass index is 17.92 kg/m. If this is out of the aformentioned range listed, please consider follow up with your Primary Care Provider.   ________________________________________________________  The [facility] providers would like to encourage you to use [facility] to communicate with providers for non-urgent requests or questions.  Due to long hold times on the telephone, sending your provider a message by [facility] may be a faster and more efficient way to get a response.  Please allow 48 business hours for a response.  Please remember that this is for non-urgent requests.  _______________________________________________________  Due to recent changes in healthcare laws, you may see the results of your imaging and laboratory studies on MyChart before your provider has had a chance to review them.  We understand that in some cases there may be results that are confusing or concerning to you. Not all laboratory results come back in the same time frame and the provider may be waiting for  multiple results in order to interpret others.  Please give [place] 48 hours in order for your provider to thoroughly review all the results before contacting the office for clarification of your results.

## 2023-04-09 DIAGNOSIS — J342 Deviated nasal septum: Secondary | ICD-10-CM | POA: Diagnosis not present

## 2023-04-09 DIAGNOSIS — J309 Allergic rhinitis, unspecified: Secondary | ICD-10-CM | POA: Diagnosis not present

## 2023-04-11 NOTE — Telephone Encounter (Signed)
Patient states she has had her visit with the ENT. Patient is requesting Dr. Allena Katz go over the notes from her visit with Scot Jun, PA-C.   Best contact number: [phone]

## 2023-04-12 NOTE — Telephone Encounter (Signed)
Called and left a voicemail asking for patient to return call to discuss.  °

## 2023-04-12 NOTE — Telephone Encounter (Signed)
Patient returned my call and has been scheduled to see her at her next earliest available appointment in July. Patient verbalized understanding and is fine with this and will call if she needs anything until then.

## 2023-04-16 DIAGNOSIS — L900 Lichen sclerosus et atrophicus: Secondary | ICD-10-CM | POA: Diagnosis not present

## 2023-04-16 DIAGNOSIS — D1801 Hemangioma of skin and subcutaneous tissue: Secondary | ICD-10-CM | POA: Diagnosis not present

## 2023-04-16 DIAGNOSIS — L821 Other seborrheic keratosis: Secondary | ICD-10-CM | POA: Diagnosis not present

## 2023-05-06 DIAGNOSIS — N3281 Overactive bladder: Secondary | ICD-10-CM | POA: Diagnosis not present

## 2023-05-06 DIAGNOSIS — N819 Female genital prolapse, unspecified: Secondary | ICD-10-CM | POA: Diagnosis not present

## 2023-05-07 DIAGNOSIS — J301 Allergic rhinitis due to pollen: Secondary | ICD-10-CM | POA: Diagnosis not present

## 2023-05-07 DIAGNOSIS — M950 Acquired deformity of nose: Secondary | ICD-10-CM | POA: Diagnosis not present

## 2023-05-20 ENCOUNTER — Other Ambulatory Visit: Payer: Self-pay | Admitting: Gastroenterology

## 2023-05-31 ENCOUNTER — Ambulatory Visit: Payer: PPO | Admitting: Internal Medicine

## 2023-06-11 ENCOUNTER — Other Ambulatory Visit: Payer: Self-pay

## 2023-06-11 ENCOUNTER — Ambulatory Visit: Payer: PPO | Admitting: Internal Medicine

## 2023-06-11 ENCOUNTER — Encounter: Payer: Self-pay | Admitting: Internal Medicine

## 2023-06-11 VITALS — BP 100/68 | HR 66 | Temp 98.2°F | Resp 16 | Ht 66.0 in | Wt 108.8 lb

## 2023-06-11 DIAGNOSIS — J4530 Mild persistent asthma, uncomplicated: Secondary | ICD-10-CM

## 2023-06-11 DIAGNOSIS — J310 Chronic rhinitis: Secondary | ICD-10-CM | POA: Diagnosis not present

## 2023-06-11 MED ORDER — FLUTICASONE PROPIONATE 50 MCG/ACT NA SUSP: 2.0000 | Freq: Every day | NASAL | 5 refills | Status: DC

## 2023-06-11 MED ORDER — BREZTRI AEROSPHERE 160-9-4.8 MCG/ACT IN AERO: 2.0000 | INHALATION_SPRAY | Freq: Two times a day (BID) | RESPIRATORY_TRACT | 5 refills | Status: DC

## 2023-06-11 MED ORDER — MUPIROCIN 2 % EX OINT: 1.0000 | TOPICAL_OINTMENT | Freq: Two times a day (BID) | CUTANEOUS | 0 refills | Status: AC

## 2023-06-11 MED ORDER — LORATADINE 10 MG PO TABS: 10.0000 mg | ORAL_TABLET | Freq: Every day | ORAL | 5 refills | Status: DC

## 2023-06-11 MED ORDER — IPRATROPIUM BROMIDE 0.03 % NA SOLN: 1.0000 | Freq: Three times a day (TID) | NASAL | 5 refills | Status: DC | PRN

## 2023-06-11 MED ORDER — AZELASTINE HCL 0.1 % NA SOLN: 1.0000 | Freq: Two times a day (BID) | NASAL | 5 refills | Status: DC

## 2023-06-11 MED ORDER — MONTELUKAST SODIUM 5 MG PO CHEW: 10.0000 mg | CHEWABLE_TABLET | Freq: Every day | ORAL | 5 refills | Status: DC

## 2023-06-11 MED ORDER — ALBUTEROL SULFATE HFA 108 (90 BASE) MCG/ACT IN AERS: INHALATION_SPRAY | RESPIRATORY_TRACT | 1 refills | Status: DC
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
## 2023-06-11 NOTE — Patient Instructions (Addendum)
[name] Return in about 6 months (around 12/12/2023).   Mixed Rhinitis: - SPT 07/2019: positive to grasses, mold  - Use nasal saline rinses before nose sprays such as with Neilmed Sinus Rinse.  Use distilled water.   - Use Xlear gel as needed for moisturizing.   - Use Mupirocin ointment twice daily for 14 days.   - Use Flonase 2 sprays each nostril daily. Aim upward and outward. - Use Azelastine 1-2 sprays each nostril twice daily. Aim upward and outward. - Use Ipratropium 1-2 sprays up to three times daily for runny nose.  Can cause a lot of dryness.  - Use Claritin 10 mg daily.  - Use Singulair 10mg  daily.   Mild Persistent Asthma: Daily controller medication(s):   Continue Breztri 2 puffs twice a day with spacer and rinse mouth afterwards.  Continue Singulair 10mg  daily. May use albuterol rescue inhaler 2 puffs every 4 to 6 hours as needed for shortness of breath, chest tightness, coughing, and wheezing. May use albuterol rescue inhaler 2 puffs 5 to 15 minutes prior to strenuous physical activities. Monitor frequency of use.  Asthma control goals:  Full participation in all desired activities (may need albuterol before activity) Albuterol use two times or less a week on average (not counting use with activity) Cough interfering with sleep two times or less a month Oral steroids no more than once a year No hospitalizations

## 2023-06-18 ENCOUNTER — Encounter: Payer: Self-pay | Admitting: Internal Medicine

## 2023-06-18 NOTE — Patient Instructions (Addendum)
Blood work was ordered.   The lab is on the first floor.    Medications changes include :   None     Return in about 1 year (around 06/18/2024) for Physical Exam.   Health Maintenance, Female Adopting a healthy lifestyle and getting preventive care are important in promoting health and wellness. Ask your health care provider about: The right schedule for you to have regular tests and exams. Things you can do on your own to prevent diseases and keep yourself healthy. What should I know about diet, weight, and exercise? Eat a healthy diet  Eat a diet that includes plenty of vegetables, fruits, low-fat dairy products, and lean protein. Do not eat a lot of foods that are high in solid fats, added sugars, or sodium. Maintain a healthy weight Body mass index (BMI) is used to identify weight problems. It estimates body fat based on height and weight. Your health care provider can help determine your BMI and help you achieve or maintain a healthy weight. Get regular exercise Get regular exercise. This is one of the most important things you can do for your health. Most adults should: Exercise for at least 150 minutes each week. The exercise should increase your heart rate and make you sweat (moderate-intensity exercise). Do strengthening exercises at least twice a week. This is in addition to the moderate-intensity exercise. Spend less time sitting. Even light physical activity can be beneficial. Watch cholesterol and blood lipids Have your blood tested for lipids and cholesterol at 73 years of age, then have this test every 5 years. Have your cholesterol levels checked more often if: Your lipid or cholesterol levels are high. You are older than 73 years of age. You are at high risk for heart disease. What should I know about cancer screening? Depending on your health history and family history, you may need to have cancer screening at various ages. This may include screening  for: Breast cancer. Cervical cancer. Colorectal cancer. Skin cancer. Lung cancer. What should I know about heart disease, diabetes, and high blood pressure? Blood pressure and heart disease High blood pressure causes heart disease and increases the risk of stroke. This is more likely to develop in people who have high blood pressure readings or are overweight. Have your blood pressure checked: Every 3-5 years if you are 57-34 years of age. Every year if you are 49 years old or older. Diabetes Have regular diabetes screenings. This checks your fasting blood sugar level. Have the screening done: Once every three years after age 10 if you are at a normal weight and have a low risk for diabetes. More often and at a younger age if you are overweight or have a high risk for diabetes. What should I know about preventing infection? Hepatitis B If you have a higher risk for hepatitis B, you should be screened for this virus. Talk with your health care provider to find out if you are at risk for hepatitis B infection. Hepatitis C Testing is recommended for: Everyone born from [birth year] through 1966. Anyone with known risk factors for hepatitis C. Sexually transmitted infections (STIs) Get screened for STIs, including gonorrhea and chlamydia, if: You are sexually active and are younger than 73 years of age. You are older than 73 years of age and your health care provider tells you that you are at risk for this type of infection. Your sexual activity has changed since you were last screened, and you are at  increased risk for chlamydia or gonorrhea. Ask your health care provider if you are at risk. Ask your health care provider about whether you are at high risk for HIV. Your health care provider may recommend a prescription medicine to help prevent HIV infection. If you choose to take medicine to prevent HIV, you should first get tested for HIV. You should then be tested every 3 months for as long as you  are taking the medicine. Pregnancy If you are about to stop having your period (premenopausal) and you may become pregnant, seek counseling before you get pregnant. Take 400 to 800 micrograms (mcg) of folic acid every day if you become pregnant. Ask for birth control (contraception) if you want to prevent pregnancy. Osteoporosis and menopause Osteoporosis is a disease in which the bones lose minerals and strength with aging. This can result in bone fractures. If you are 21 years old or older, or if you are at risk for osteoporosis and fractures, ask your health care provider if you should: Be screened for bone loss. Take a calcium or vitamin D supplement to lower your risk of fractures. Be given hormone replacement therapy (HRT) to treat symptoms of menopause. Follow these instructions at home: Alcohol use Do not drink alcohol if: Your health care provider tells you not to drink. You are pregnant, may be pregnant, or are planning to become pregnant. If you drink alcohol: Limit how much you have to: 0-1 drink a day. Know how much alcohol is in your drink. In the [place] one drink equals one 12 oz bottle of beer (355 mL), one 5 oz glass of wine (148 mL), or one 1 oz glass of hard liquor (44 mL). Lifestyle Do not use any products that contain nicotine or tobacco. These products include cigarettes, chewing tobacco, and vaping devices, such as e-cigarettes. If you need help quitting, ask your health care provider. Do not use street drugs. Do not share needles. Ask your health care provider for help if you need support or information about quitting drugs. General instructions Schedule regular health, dental, and eye exams. Stay current with your vaccines. Tell your health care provider if: You often feel depressed. You have ever been abused or do not feel safe at home. Summary Adopting a healthy lifestyle and getting preventive care are important in promoting health and wellness. Follow your  health care provider's instructions about healthy diet, exercising, and getting tested or screened for diseases. Follow your health care provider's instructions on monitoring your cholesterol and blood pressure. This information is not intended to replace advice given to you by your health care provider. Make sure you discuss any questions you have with your health care provider. Document Revised: 03/27/2021 Document Reviewed: 03/27/2021 Elsevier Patient Education  2024 [employer].

## 2023-06-18 NOTE — Progress Notes (Unsigned)
Subjective:    Patient ID: Kara Ramirez, female    DOB: 02-Jan-1951, 72 y.o.   MRN: [id number]      HPI Kara Ramirez is here for a Physical exam and her chronic medical problems.      Medications and allergies reviewed with patient and updated if appropriate.  Current Outpatient Medications on File Prior to Visit  Medication Sig Dispense Refill   acetaminophen (TYLENOL) 325 MG tablet Take 1-2 tablets (325-650 mg total) by mouth every 4 (four) hours as needed for mild pain.     albuterol (VENTOLIN HFA) 108 (90 Base) MCG/ACT inhaler INHALE 1 OR 2 PUFFS INTO THE LUNGS EVERY 6 HOURS AS NEEDED FOR WHEEZING OR SHORTNESS OF BREATH 8.5 g 1   alendronate (FOSAMAX) 70 MG tablet Take by mouth.     Apoaequorin (PREVAGEN PO) Take 1 tablet by mouth daily.     aspirin-acetaminophen-caffeine (EXCEDRIN MIGRAINE) 250-250-65 MG tablet Take 1 tablet by mouth every 6 (six) hours as needed for headache.     azelastine (ASTELIN) 0.1 % nasal spray Place 1 spray into both nostrils 2 (two) times daily. Use in each nostril as directed 30 mL 5   benzonatate (TESSALON PERLES) 100 MG capsule Take 1 capsule (100 mg total) by mouth 3 (three) times daily as needed. 30 capsule 0   Budeson-Glycopyrrol-Formoterol (BREZTRI AEROSPHERE) 160-9-4.8 MCG/ACT AERO Inhale 2 puffs into the lungs in the morning and at bedtime. 10.7 g 5   Calcium Carbonate-Vitamin D 600-400 MG-UNIT tablet Take 1 tablet by mouth daily.     carbidopa-levodopa (SINEMET IR) 25-100 MG tablet 2 at 7am, 2 at 11am, 2 at 4pm 360 tablet 1   Carboxymethylcellul-Glycerin 1-0.9 % GEL Place 1 application into both eyes at bedtime.     Co-Enzyme Q-10 100 MG CAPS Take 100 mg by mouth daily.      divalproex (DEPAKOTE ER) 250 MG 24 hr tablet SMARTSIG:1 Tablet(s) By Mouth     divalproex (DEPAKOTE ER) 500 MG 24 hr tablet SMARTSIG:1 Tablet(s) By Mouth     Eyelid Cleansers (STERILID) FOAM Apply topically.     ezetimibe (ZETIA) 10 MG tablet Take 1 tablet (10 mg total)  by mouth daily. 90 tablet 3   famotidine (PEPCID) 20 MG tablet TAKE 1 TABLET BY MOUTH DAILY 90 tablet 1   fluocinonide ointment (LIDEX) 0.05 % Apply 1 application topically See admin instructions. Apply topically twice daily for 5 days alternating with Tacrolimus ointment     fluticasone (FLONASE) 50 MCG/ACT nasal spray Place 2 sprays into both nostrils daily. 16 g 5   ipratropium (ATROVENT) 0.03 % nasal spray Place 1 spray into both nostrils 3 (three) times daily as needed for rhinitis. 30 mL 5   linaclotide (LINZESS) 145 MCG CAPS capsule Take 1 capsule (145 mcg total) by mouth daily before breakfast. [id number], Exp: 01-2023 30 capsule 2   loratadine (CLARITIN) 10 MG tablet Take 1 tablet (10 mg total) by mouth daily. 30 tablet 5   MALIC ACID PO Take 1 capsule by mouth at bedtime.     metoprolol tartrate (LOPRESSOR) 25 MG tablet Take 0.5 tablets (12.5 mg total) by mouth 2 (two) times daily. 90 tablet 3   mirabegron ER (MYRBETRIQ) 50 MG TB24 tablet Take 50 mg by mouth daily as needed.     Misc. Devices (ROLLER WALKER) MISC Rolling walker with seat. Dx:G20, R26.81 1 each 0   montelukast (SINGULAIR) 5 MG chewable tablet Chew 2 tablets (10 mg total) by mouth at  bedtime. 60 tablet 5   mupirocin ointment (BACTROBAN) 2 % Apply 1 Application topically 2 (two) times daily for 14 days. 30 g 0   naproxen sodium (ALEVE) 220 MG tablet Take 220 mg by mouth daily as needed.      Olopatadine HCl 0.2 % SOLN Apply to eye 2 (two) times daily as needed.     Omega-3 Fatty Acids (FISH OIL) 1000 MG CAPS Take 1,000 mg by mouth at bedtime.      pravastatin (PRAVACHOL) 40 MG tablet Take 1 tablet (40 mg total) by mouth daily. 90 tablet 3   sodium chloride (OCEAN) 0.65 % SOLN nasal spray Place 1 spray into both nostrils as needed for congestion. (Patient taking differently: Place 1 spray into both nostrils 4 (four) times daily as needed for congestion.)  0   tacrolimus (PROTOPIC) 0.1 % ointment Apply 1 application topically 2  (two) times daily as needed (PRN skin issues). (Patient taking differently: Apply 1 application  topically See admin instructions. Apply topically twice daily for 5 days alternating with Fluocinonide ointment) 100 g 0   Thiamine HCl (VITAMIN B-1) 100 MG tablet Take 100 mg by mouth daily.     triamcinolone ointment (KENALOG) 0.5 % Apply 1 application topically 2 (two) times daily. For rash on left lower leg 30 g 0   No current facility-administered medications on file prior to visit.    Review of Systems     Objective:  There were no vitals filed for this visit. There were no vitals filed for this visit. There is no height or weight on file to calculate BMI.  BP Readings from Last 3 Encounters:  06/11/23 100/68  03/29/23 116/60  03/07/23 110/62    Wt Readings from Last 3 Encounters:  06/11/23 108 lb 12.8 oz (49.4 kg)  03/29/23 111 lb (50.3 kg)  03/07/23 117 lb (53.1 kg)       Physical Exam Constitutional: She appears well-developed and well-nourished. No distress.  HENT:  Head: Normocephalic and atraumatic.  Right Ear: External ear normal. Normal ear canal and TM Left Ear: External ear normal.  Normal ear canal and TM Mouth/Throat: Oropharynx is clear and moist.  Eyes: Conjunctivae normal.  Neck: Neck supple. No tracheal deviation present. No thyromegaly present.  No carotid bruit  Cardiovascular: Normal rate, regular rhythm and normal heart sounds.   No murmur heard.  No edema. Pulmonary/Chest: Effort normal and breath sounds normal. No respiratory distress. She has no wheezes. She has no rales.  Breast: deferred   Abdominal: Soft. She exhibits no distension. There is no tenderness.  Lymphadenopathy: She has no cervical adenopathy.  Skin: Skin is warm and dry. She is not diaphoretic.  Psychiatric: She has a normal mood and affect. Her behavior is normal.     Lab Results  Component Value Date   WBC 9.2 06/15/2022   HGB 13.6 06/15/2022   HCT 41.3 06/15/2022   PLT  266.0 06/15/2022   GLUCOSE 84 06/15/2022   CHOL 164 06/15/2022   TRIG 94.0 06/15/2022   HDL 68.20 06/15/2022   LDLCALC 77 06/15/2022   ALT 20 06/15/2022   AST 20 06/15/2022   NA 140 06/15/2022   K 4.0 06/15/2022   CL 104 06/15/2022   CREATININE 0.83 06/15/2022   BUN 17 06/15/2022   CO2 28 06/15/2022   TSH 0.62 06/15/2022   INR 1.00 08/07/2018   HGBA1C 5.8 06/15/2022         Assessment & Plan:   Physical exam:  Screening blood work  ordered Exercise   Weight   Substance abuse  none   Reviewed recommended immunizations.   Health Maintenance  Topic Date Due   DTaP/Tdap/Td (1 - Tdap) Never done   Medicare Annual Wellness (AWV)  09/02/2021   DEXA SCAN  11/06/2021   MAMMOGRAM  11/08/2021   COVID-19 Vaccine (4 - 2023-24 season) 07/20/2022   INFLUENZA VACCINE  06/20/2023   Colonoscopy  06/07/2025   Pneumonia Vaccine 83+ Years old  Completed   Hepatitis C Screening  Completed   Zoster Vaccines- Shingrix  Completed   HPV VACCINES  Aged Out          See Problem List for Assessment and Plan of chronic medical problems.

## 2023-06-19 ENCOUNTER — Ambulatory Visit: Payer: PPO | Admitting: Internal Medicine

## 2023-06-19 VITALS — BP 104/72 | HR 67 | Temp 97.9°F | Ht 66.0 in | Wt 107.0 lb

## 2023-06-19 DIAGNOSIS — R634 Abnormal weight loss: Secondary | ICD-10-CM

## 2023-06-19 DIAGNOSIS — I700 Atherosclerosis of aorta: Secondary | ICD-10-CM | POA: Diagnosis not present

## 2023-06-19 DIAGNOSIS — M810 Age-related osteoporosis without current pathological fracture: Secondary | ICD-10-CM

## 2023-06-19 DIAGNOSIS — Z0000 Encounter for general adult medical examination without abnormal findings: Secondary | ICD-10-CM | POA: Diagnosis not present

## 2023-06-19 DIAGNOSIS — K219 Gastro-esophageal reflux disease without esophagitis: Secondary | ICD-10-CM

## 2023-06-19 DIAGNOSIS — I480 Paroxysmal atrial fibrillation: Secondary | ICD-10-CM

## 2023-06-19 DIAGNOSIS — E042 Nontoxic multinodular goiter: Secondary | ICD-10-CM

## 2023-06-19 DIAGNOSIS — R7303 Prediabetes: Secondary | ICD-10-CM

## 2023-06-19 DIAGNOSIS — R931 Abnormal findings on diagnostic imaging of heart and coronary circulation: Secondary | ICD-10-CM

## 2023-06-19 DIAGNOSIS — E782 Mixed hyperlipidemia: Secondary | ICD-10-CM

## 2023-06-19 LAB — CBC WITH DIFFERENTIAL/PLATELET
Basophils Absolute: 0.1 10*3/uL (ref 0.0–0.1)
Basophils Relative: 0.9 % (ref 0.0–3.0)
Eosinophils Absolute: 0.1 10*3/uL (ref 0.0–0.7)
Eosinophils Relative: 1.3 % (ref 0.0–5.0)
HCT: 42.4 % (ref 36.0–46.0)
Hemoglobin: 13.9 g/dL (ref 12.0–15.0)
Lymphocytes Relative: 35.4 % (ref 12.0–46.0)
Lymphs Abs: 2.2 10*3/uL (ref 0.7–4.0)
MCHC: 32.7 g/dL (ref 30.0–36.0)
MCV: 90.4 fl (ref 78.0–100.0)
Monocytes Absolute: 0.3 10*3/uL (ref 0.1–1.0)
Monocytes Relative: 5.3 % (ref 3.0–12.0)
Neutro Abs: 3.6 10*3/uL (ref 1.4–7.7)
Neutrophils Relative %: 57.1 % (ref 43.0–77.0)
Platelets: 250 10*3/uL (ref 150.0–400.0)
RBC: 4.7 Mil/uL (ref 3.87–5.11)
RDW: 13.2 % (ref 11.5–15.5)
WBC: 6.3 10*3/uL (ref 4.0–10.5)

## 2023-06-19 LAB — LIPID PANEL
Cholesterol: 173 mg/dL (ref 0–200)
HDL: 69.6 mg/dL (ref >39.00)
LDL Cholesterol: 87 mg/dL (ref 0–99)
NonHDL: 103.45
Total CHOL/HDL Ratio: 2
Triglycerides: 80 mg/dL (ref 0.0–149.0)
VLDL: 16 mg/dL (ref 0.0–40.0)

## 2023-06-19 LAB — COMPREHENSIVE METABOLIC PANEL
ALT: 13 U/L (ref 0–35)
AST: 15 U/L (ref 0–37)
Albumin: 4.6 g/dL (ref 3.5–5.2)
Alkaline Phosphatase: 40 U/L (ref 39–117)
BUN: 17 mg/dL (ref 6–23)
CO2: 27 mEq/L (ref 19–32)
Calcium: 9.8 mg/dL (ref 8.4–10.5)
Chloride: 106 mEq/L (ref 96–112)
Creatinine, Ser: 0.92 mg/dL (ref 0.40–1.20)
GFR: 61.93 mL/min (ref >60.00)
Glucose, Bld: 91 mg/dL (ref 70–99)
Potassium: 4.1 mEq/L (ref 3.5–5.1)
Sodium: 142 mEq/L (ref 135–145)
Total Bilirubin: 0.7 mg/dL (ref 0.2–1.2)
Total Protein: 7.5 g/dL (ref 6.0–8.3)

## 2023-06-19 LAB — VITAMIN D 25 HYDROXY (VIT D DEFICIENCY, FRACTURES): VITD: 67.26 ng/mL (ref 30.00–100.00)

## 2023-06-19 LAB — HEMOGLOBIN A1C: Hgb A1c MFr Bld: 5.6 % (ref 4.6–6.5)

## 2023-06-19 LAB — TSH: TSH: 0.73 u[IU]/mL (ref 0.35–5.50)

## 2023-06-19 NOTE — Assessment & Plan Note (Signed)
Chronic Continue pravastatin 40 mg daily and zetia 10 mg daily Check lipids, cmp

## 2023-06-19 NOTE — Assessment & Plan Note (Signed)
Chronic Check TSH Nodules have been biopsied in the past similar benign

## 2023-06-19 NOTE — Assessment & Plan Note (Signed)
Chronic GERD controlled Continue Pepcid 20 mg daily 

## 2023-06-19 NOTE — Assessment & Plan Note (Signed)
Following with cardiology History of atrial fibrillation On metoprolol 12.5 mg twice daily In normal rhythm here today CBC, CMP, TSH

## 2023-06-19 NOTE — Assessment & Plan Note (Signed)
New Over the past year she has had a significant weight loss that has been unintentional She is eating 3 meals a day but states she is not eating much Advised that she needs to be consuming more calories-can consider drinking an Ensure between meals or increasing meals or snacking between meals Advised that she start monitoring her weight Discussed the dangers of losing more weight and would ideally like her to gain some weight back Check blood work today including CBC, CMP and TSH

## 2023-06-19 NOTE — Assessment & Plan Note (Signed)
Chronic Management per GYN On Fosamax 70 mg weekly Taking calcium and vitamin D

## 2023-06-19 NOTE — Assessment & Plan Note (Signed)
Chronic Check a1c Low sugar / carb diet Stressed regular exercise  

## 2023-06-19 NOTE — Assessment & Plan Note (Addendum)
Chronic Regular exercise and healthy diet encouraged Check lipid panel, CMP Continue pravastatin 40 mg daily, Zetia 10 mg daily

## 2023-06-20 ENCOUNTER — Telehealth: Payer: Self-pay | Admitting: Internal Medicine

## 2023-06-20 NOTE — Telephone Encounter (Signed)
Pt's daughter picked up jury duty form

## 2023-06-28 ENCOUNTER — Ambulatory Visit (INDEPENDENT_AMBULATORY_CARE_PROVIDER_SITE_OTHER): Payer: PPO | Admitting: *Deleted

## 2023-06-28 VITALS — Ht 66.0 in | Wt 107.0 lb

## 2023-06-28 DIAGNOSIS — Z0000 Encounter for general adult medical examination without abnormal findings: Secondary | ICD-10-CM | POA: Diagnosis not present
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
## 2023-06-28 NOTE — Patient Instructions (Signed)

## 2023-08-01 ENCOUNTER — Other Ambulatory Visit: Payer: Self-pay

## 2023-08-01 MED ORDER — METOPROLOL TARTRATE 25 MG PO TABS: 12.5000 mg | ORAL_TABLET | Freq: Two times a day (BID) | ORAL | 0 refills | Status: DC

## 2023-08-07 ENCOUNTER — Other Ambulatory Visit: Payer: Self-pay

## 2023-08-07 MED ORDER — METOPROLOL TARTRATE 25 MG PO TABS: 12.5000 mg | ORAL_TABLET | Freq: Two times a day (BID) | ORAL | 0 refills | Status: DC

## 2023-08-12 ENCOUNTER — Telehealth: Payer: Self-pay | Admitting: Gastroenterology

## 2023-08-12 NOTE — Telephone Encounter (Signed)
Returned call to patient. Pt states that she had been having bowel movements daily on Miralax and Citrucel regimen. Last solid BM was Saturday. Pt has been experiencing loose stools since yesterday, she did report that they were black. I advised that the color of the stool could be related to the Pepto-Bismol. Pt reports that it was hard to tell how many stools she had because it just comes out. Pt thinks she has passed about 12 stools in the last 2 days. Pt reports some lower (R) abdominal pain. Pt states that she does feel similar to when she had overflow diarrhea and had to complete bowel purge previously. I told pt that you are out of the office today but return tomorrow. I told pt that I was not sure if you would order another x-ray or just possibly change bowel regimen. Pt is okay with waiting for a response.

## 2023-08-12 NOTE — Telephone Encounter (Signed)
Inbound call fro patient states she has been having constant diarrhea since yesterday.   Patient has tried taking pepto bismol  Please advise. Thank you

## 2023-08-13 NOTE — Telephone Encounter (Signed)
Called and left patient a detailed vm with Dr. Lanetta Inch recommendations as outlined below. Pt has been advised to call back if she has continued concerns despite recommendations.

## 2023-08-13 NOTE — Telephone Encounter (Signed)
If she has had good stool output on the regimen she was taking, overflow would seem less likely. She can try just holding the Miralax for a few days and see how she does. If she feels backed up and that overflow is occurring then okay to do Miralax purge if that helped her in the past

## 2023-08-23 ENCOUNTER — Ambulatory Visit: Payer: PPO | Admitting: Internal Medicine

## 2023-08-28 ENCOUNTER — Other Ambulatory Visit: Payer: Self-pay

## 2023-08-28 MED ORDER — EZETIMIBE 10 MG PO TABS: 10.0000 mg | ORAL_TABLET | Freq: Every day | ORAL | 0 refills | Status: DC

## 2023-09-18 ENCOUNTER — Other Ambulatory Visit: Payer: Self-pay | Admitting: Gastroenterology

## 2023-10-08 ENCOUNTER — Encounter: Payer: Self-pay | Admitting: Cardiovascular Disease

## 2023-10-08 ENCOUNTER — Ambulatory Visit: Payer: PPO | Attending: Cardiovascular Disease | Admitting: Cardiovascular Disease

## 2023-10-08 VITALS — BP 97/64 | HR 80 | Ht 60.0 in | Wt 101.0 lb

## 2023-10-08 DIAGNOSIS — E782 Mixed hyperlipidemia: Secondary | ICD-10-CM | POA: Diagnosis not present

## 2023-10-08 DIAGNOSIS — R931 Abnormal findings on diagnostic imaging of heart and coronary circulation: Secondary | ICD-10-CM

## 2023-10-08 NOTE — Progress Notes (Unsigned)
10/08/2023 [name]   [birth year]  [id number]  Primary Physician Pincus Sanes, MD Primary Cardiologist: Runell Gess MD Nicholes Calamity, [place]  HPI:  Kara Ramirez is a 72 y.o.   mildly overweight married Caucasian female mother of 2, grandmother of 1 grandchild whose husband Kara Ramirez is also patient mild is accompanying her today. She is retired from working in Audiological scientist.  I last saw her in the office 09/10/2022.[name] Her primary care physician is Dr. Cheryll Cockayne. I last saw her in the office 05/27/15. She really has no risk factors other than mild hyperlipidemia on red yeast rice. Her sister did have myocardial infarctions. She's had chest pain off and on for a year which is somewhat atypical. It occurs typically last for minutes at a time with occasional associated shortness of breath. Performed 2-D echocardiography and Myoview stress testing July 2016 which were normal. She was recently seen at [facility] in September with chest pain and shortness of breath. A dobutamine echo was normal. She saw [name] [facility] in the  office 09/02/17 who ordered an event monitor that did show some brief runs of PAF.     She did have a VATS procedure by Dr. Dorris Fetch revealing a mass which was nonmalignant.  Coronary CTA performed 12/10/2017 revealing a coronary calcium score 125 with minimal CAD.     Since I saw her a year ago she is done well.  She has developed an intention tremor over the last several months.  She is also lost over 25 pounds for unclear reasons.  She denies chest pain or shortness of breath.   Current Meds  Medication Sig   acetaminophen (TYLENOL) 325 MG tablet Take 1-2 tablets (325-650 mg total) by mouth every 4 (four) hours as needed for mild pain.   albuterol (VENTOLIN HFA) 108 (90 Base) MCG/ACT inhaler INHALE 1 OR 2 PUFFS INTO THE LUNGS EVERY 6 HOURS AS NEEDED FOR WHEEZING OR SHORTNESS OF BREATH   alendronate (FOSAMAX) 70 MG tablet Take by mouth.   Apoaequorin  (PREVAGEN PO) Take 1 tablet by mouth daily.   aspirin-acetaminophen-caffeine (EXCEDRIN MIGRAINE) 250-250-65 MG tablet Take 1 tablet by mouth every 6 (six) hours as needed for headache.   azelastine (ASTELIN) 0.1 % nasal spray Place 1 spray into both nostrils 2 (two) times daily. Use in each nostril as directed   Budeson-Glycopyrrol-Formoterol (BREZTRI AEROSPHERE) 160-9-4.8 MCG/ACT AERO Inhale 2 puffs into the lungs in the morning and at bedtime.   Calcium Carbonate-Vitamin D 600-400 MG-UNIT tablet Take 1 tablet by mouth daily.   Carboxymethylcellul-Glycerin 1-0.9 % GEL Place 1 application into both eyes at bedtime.   Co-Enzyme Q-10 100 MG CAPS Take 100 mg by mouth daily.    divalproex (DEPAKOTE ER) 250 MG 24 hr tablet SMARTSIG:1 Tablet(s) By Mouth   divalproex (DEPAKOTE ER) 500 MG 24 hr tablet SMARTSIG:1 Tablet(s) By Mouth   Eyelid Cleansers (STERILID) FOAM Apply topically.   ezetimibe (ZETIA) 10 MG tablet Take 1 tablet (10 mg total) by mouth daily.   famotidine (PEPCID) 20 MG tablet TAKE 1 TABLET BY MOUTH DAILY   fluocinonide ointment (LIDEX) 0.05 % Apply 1 application topically See admin instructions. Apply topically twice daily for 5 days alternating with Tacrolimus ointment   fluticasone (FLONASE) 50 MCG/ACT nasal spray Place 2 sprays into both nostrils daily.   ipratropium (ATROVENT) 0.03 % nasal spray Place 1 spray into both nostrils 3 (three) times daily as needed for rhinitis.   linaclotide (  LINZESS) 145 MCG CAPS capsule Take 1 capsule (145 mcg total) by mouth daily before breakfast. [id number], Exp: 01-2023   loratadine (CLARITIN) 10 MG tablet Take 1 tablet (10 mg total) by mouth daily.   MALIC ACID PO Take 1 capsule by mouth at bedtime.   metoprolol tartrate (LOPRESSOR) 25 MG tablet Take 0.5 tablets (12.5 mg total) by mouth 2 (two) times daily.   mirabegron ER (MYRBETRIQ) 50 MG TB24 tablet Take 50 mg by mouth daily as needed.   Misc. Devices (ROLLER WALKER) MISC Rolling walker with seat.  Dx:G20, R26.81   montelukast (SINGULAIR) 5 MG chewable tablet Chew 2 tablets (10 mg total) by mouth at bedtime.   naproxen sodium (ALEVE) 220 MG tablet Take 220 mg by mouth daily as needed.    Olopatadine HCl 0.2 % SOLN Apply to eye 2 (two) times daily as needed.   Omega-3 Fatty Acids (FISH OIL) 1000 MG CAPS Take 1,000 mg by mouth at bedtime.    pravastatin (PRAVACHOL) 40 MG tablet Take 1 tablet (40 mg total) by mouth daily.   sodium chloride (OCEAN) 0.65 % SOLN nasal spray Place 1 spray into both nostrils as needed for congestion. (Patient taking differently: Place 1 spray into both nostrils 4 (four) times daily as needed for congestion.)   tacrolimus (PROTOPIC) 0.1 % ointment Apply 1 application topically 2 (two) times daily as needed (PRN skin issues). (Patient taking differently: Apply 1 application  topically See admin instructions. Apply topically twice daily for 5 days alternating with Fluocinonide ointment)   Thiamine HCl (VITAMIN B-1) 100 MG tablet Take 100 mg by mouth daily.   triamcinolone ointment (KENALOG) 0.5 % Apply 1 application topically 2 (two) times daily. For rash on left lower leg     Allergies  Allergen Reactions   Mold Extract [Trichophyton Mentagrophyte] Shortness Of Breath and Rash   Penicillins Shortness Of Breath, Rash and Other (See Comments)    Eyes puffy Has taken low dose pcn and no rx REACTION: rash, SOB Has patient had a PCN reaction causing immediate rash, facial/tongue/throat swelling, SOB or lightheadedness with hypotension: yes Has patient had a PCN reaction causing severe rash involving mucus membranes or skin necrosis: unk Has patient had a PCN reaction that required hospitalization: no Has patient had a PCN reaction occurring within the last 10 years: unk If all of the above answers are "NO", then may proceed with Cephalospor   Morphine Other (See Comments)    REACTION: tachycardia and anxiety   Peanut Oil Nausea And Vomiting    Peanut butter    Protonix [Pantoprazole Sodium] Nausea And Vomiting   Atorvastatin     MYAGLIAS   Citrus Other (See Comments)    Unknown   Peanut-Containing Drug Products Other (See Comments)    Unknown   Rosuvastatin     MYALGIAS   Tramadol Other (See Comments)    Makes crazy;confused   Valium [Diazepam] Other (See Comments)    Confusion per family   Cetirizine Rash and Other (See Comments)    Around face   Codeine Other (See Comments)    REACTION: dizzy and "groggy in my head"   Egg-Derived Products Nausea And Vomiting   Latex Itching   Pentazocine Lactate Palpitations    Social History   Socioeconomic History   Marital status: Married    Spouse name: Not on file   Number of children: 2   Years of education: Not on file   Highest education level: Not on file  Occupational  History   Occupation: retired    Associate Professor: [employer]  Tobacco Use   Smoking status: Never    Passive exposure: Never   Smokeless tobacco: Never  Vaping Use   Vaping status: Never Used  Substance and Sexual Activity   Alcohol use: Never    Alcohol/week: 0.0 standard drinks of alcohol   Drug use: Never   Sexual activity: Not Currently  Other Topics Concern   Not on file  Social History Narrative   Right handed   One story home   Slight caffeine   Lives with husband   Social Determinants of Health   Financial Resource Strain: Low Risk  (06/28/2023)   Overall Financial Resource Strain (CARDIA)    Difficulty of Paying Living Expenses: Not very hard  Food Insecurity: No Food Insecurity (06/28/2023)   Hunger Vital Sign    Worried About Running Out of Food in the Last Year: Never true    Ran Out of Food in the Last Year: Never true  Transportation Needs: No Transportation Needs (06/28/2023)   PRAPARE - Administrator, Civil Service (Medical): No    Lack of Transportation (Non-Medical): No  Physical Activity: Sufficiently Active (06/28/2023)   Exercise Vital Sign    Days of Exercise per  Week: 5 days    Minutes of Exercise per Session: 30 min  Stress: No Stress Concern Present (06/28/2023)   [employer] of Occupational Health - Occupational Stress Questionnaire    Feeling of Stress : Not at all  Social Connections: Moderately Integrated (06/28/2023)   Social Connection and Isolation Panel [NHANES]    Frequency of Communication with Friends and Family: More than three times a week    Frequency of Social Gatherings with Friends and Family: More than three times a week    Attends Religious Services: 1 to 4 times per year    Active Member of [employer] or Organizations: No    Attends Banker Meetings: Never    Marital Status: Married  Catering manager Violence: Not At Risk (06/28/2023)   Humiliation, Afraid, Rape, and Kick questionnaire    Fear of Current or Ex-Partner: No    Emotionally Abused: No    Physically Abused: No    Sexually Abused: No     Review of Systems: General: negative for chills, fever, night sweats or weight changes.  Cardiovascular: negative for chest pain, dyspnea on exertion, edema, orthopnea, palpitations, paroxysmal nocturnal dyspnea or shortness of breath Dermatological: negative for rash Respiratory: negative for cough or wheezing Urologic: negative for hematuria Abdominal: negative for nausea, vomiting, diarrhea, bright red blood per rectum, melena, or hematemesis Neurologic: negative for visual changes, syncope, or dizziness All other systems reviewed and are otherwise negative except as noted above.    Blood pressure 97/64, pulse 80, height 5' (1.524 m), weight 101 lb (45.8 kg), SpO2 98%.  General appearance: alert and no distress Neck: no adenopathy, no carotid bruit, no JVD, supple, symmetrical, trachea midline, and thyroid not enlarged, symmetric, no tenderness/mass/nodules Lungs: clear to auscultation bilaterally Heart: regular rate and rhythm, S1, S2 normal, no murmur, click, rub or gallop Extremities: {extremity  exam:5109} Pulses: 2+ and symmetric Skin: Skin color, texture, turgor normal. No rashes or lesions Neurologic: Grossly normal  EKG EKG Interpretation Date/Time:  Tuesday October 08 2023 10:35:21 EST Ventricular Rate:  80 PR Interval:  136 QRS Duration:  64 QT Interval:  378 QTC Calculation: 435 R Axis:   128  Text Interpretation: *** Suspect arm  lead reversal, interpretation assumes no reversal Normal sinus rhythm Left posterior fascicular block ST & T wave abnormality, consider inferior ischemia When compared with ECG of 05-Dec-2021 13:38, Left posterior fascicular block is now Present Non-specific change in ST segment in Lateral leads Nonspecific T wave abnormality, worse in Lateral leads Confirmed by Nanetta Batty [phone]) on 10/08/2023 11:08:29 AM    ASSESSMENT AND PLAN:   Hyperlipidemia History of hyperlipidemia on pravastatin and Zetia with lipid profile performed 06/19/2023 revealing total cholesterol 173, LDL 87 and HDL of 69.  She does have a mildly elevated coronary calcium score.  She is not at goal for secondary prevention but at this point given her weight loss not inclined to change her medications.  Elevated coronary artery calcium score Elevated coronary calcium score of 125 measured 12/10/2017 with minimal CAD.  She is asymptomatic.     Runell Gess MD FACP,FACC,FAHA, [facility] 10/08/2023 11:20 AM

## 2023-10-08 NOTE — Assessment & Plan Note (Signed)
History of hyperlipidemia on pravastatin and Zetia with lipid profile performed 06/19/2023 revealing total cholesterol 173, LDL 87 and HDL of 69.  She does have a mildly elevated coronary calcium score.  She is not at goal for secondary prevention but at this point given her weight loss not inclined to change her medications.

## 2023-10-08 NOTE — Assessment & Plan Note (Signed)
Elevated coronary calcium score of 125 measured 12/10/2017 with minimal CAD.  She is asymptomatic.

## 2023-10-08 NOTE — Patient Instructions (Signed)
Medication Instructions:  Your physician recommends that you continue on your current medications as directed. Please refer to the Current Medication list given to you today.  *If you need a refill on your cardiac medications before your next appointment, please call your pharmacy*   Follow-Up: At [facility], you and your health needs are our priority.  As part of our continuing mission to provide you with exceptional heart care, we have created designated Provider Care Teams.  These Care Teams include your primary Cardiologist (physician) and Advanced Practice Providers (APPs -  Physician Assistants and Nurse Practitioners) who all work together to provide you with the care you need, when you need it.  We recommend signing up for the patient portal called "MyChart".  Sign up information is provided on this After Visit Summary.  MyChart is used to connect with patients for Virtual Visits (Telemedicine).  Patients are able to view lab/test results, encounter notes, upcoming appointments, etc.  Non-urgent messages can be sent to your provider as well.   To learn more about what you can do with MyChart, go to [email or web address].    Your next appointment:   12 month(s)  Provider:   Jonathan Berry, MD    

## 2023-10-14 ENCOUNTER — Telehealth: Payer: Self-pay | Admitting: Neurology

## 2023-10-14 NOTE — Telephone Encounter (Signed)
Pt called no answer left a voice mail to call the office back  °

## 2023-10-14 NOTE — Telephone Encounter (Signed)
Pt called in and left a message with the access nurse. She stated her hands are shaking more. Her cardiologist Dr. Allyson Sabal at [facility] told her she would have to talk to Dr. Arbutus Leas about it.

## 2023-10-15 NOTE — Telephone Encounter (Signed)
Called patient and left message.

## 2023-10-15 NOTE — Telephone Encounter (Signed)
Patient called back to speak with someone about seeing Dr.tat. they are wanting an appt.

## 2023-10-15 NOTE — Telephone Encounter (Signed)
Pt called no answer left a voice mail to call the office back  °

## 2023-10-15 NOTE — Telephone Encounter (Signed)
Pt called no answer left a voice mail to call the office back

## 2023-10-16 NOTE — Telephone Encounter (Signed)
Pt husband called and made appt with Dr Tat on 02-17-24 and is on the wait list. They would like to speak to someone about what is going on with the patient. She is shaking bad and the cardiologist was concern

## 2023-10-16 NOTE — Telephone Encounter (Signed)
Spoke to patient and they do not want to go back to [facility] so I did tell them about the movement disorder clinic in [place] and to call PCP for a referral because we have run all the testing that Dr. Arbutus Leas knows with neg results on everything. Dr. Arbutus Leas is recommending getting another opinion to determine what is causing this patients symptoms

## 2023-10-21 ENCOUNTER — Telehealth: Payer: Self-pay | Admitting: Gastroenterology

## 2023-10-21 DIAGNOSIS — R197 Diarrhea, unspecified: Secondary | ICD-10-CM

## 2023-10-21 DIAGNOSIS — K921 Melena: Secondary | ICD-10-CM

## 2023-10-21 NOTE — Telephone Encounter (Signed)
Inbound call from patient, would like to speak with a nurse, she states she has had runny bowels all weekend. She has not been able to eat due to the diarrhea. She states she is experiencing excruciating pain and has tried many things but "nothing helps". She is requesting to speak with a nurse.

## 2023-10-21 NOTE — Telephone Encounter (Signed)
Sorry to hear this, she has had history of diarrhea from overflow in the past.  We last had put her on MiraLAX along with Citrucel to help bulk her stools.   If she has been taking MiraLAX recently she should hold it.  If she has been holding this and continues to have loose stools, would send for GI pathogen panel plus C. difficile to make sure no infectious etiology at this time.  We can also have her go to the lab for hemoglobin and BMET to make sure no anemia and normal BUN in light of her dark stools.  If these are abnormal and she is having bleeding she would need to go to the hospital, if she is just having dark stools and her hemoglobin is normal then would be unlikely to be having blood loss causing this.  Can you please let her know and ask her to go to the lab for stool testing and blood work?  Thanks

## 2023-10-21 NOTE — Telephone Encounter (Signed)
Patient is advised of Dr Lanetta Inch response/recommendations and verbalizes understanding. She will come for labs tomorrow.

## 2023-10-21 NOTE — Telephone Encounter (Signed)
Patient calls stating that she has been having generalized abdominal cramping intermittently since late Thanksgiving day. She states that intensity varies and she also has been having multiple diarrheal/loose bowel movements daily. Says that her stools have been "really dark." When asked to elaborate further, patient explains that sometimes her stools are tarry, dark and other times they are just darker than normal. She denies use of pepto bismol, kaopectate. States that she has had several episodes of fecal incontinence recently. Denies any fever or chills. Patient also says that she has been losing weight and has not been eating well. With further questioning, patient states that she has had loss of appetite for some time now and weight loss has not been new. Feels that she is not eating due to being scared she will have diarrhea as well as for lack of wanting to eat. Patient denies any new medications, denies any sick contacts, denies any recent antibiotic exposure. States that she took miralax for a couple of days over 1 week ago because she felt "backed up" but has not taken any since.  Patient with history of similar scenario 07/2023 at which time she was told to hold Miralax for several days.

## 2023-10-22 ENCOUNTER — Ambulatory Visit: Payer: PPO | Admitting: Internal Medicine

## 2023-10-22 ENCOUNTER — Other Ambulatory Visit (INDEPENDENT_AMBULATORY_CARE_PROVIDER_SITE_OTHER): Payer: PPO

## 2023-10-22 DIAGNOSIS — R197 Diarrhea, unspecified: Secondary | ICD-10-CM

## 2023-10-22 DIAGNOSIS — K921 Melena: Secondary | ICD-10-CM | POA: Diagnosis not present

## 2023-10-22 LAB — BASIC METABOLIC PANEL
BUN: 22 mg/dL (ref 6–23)
CO2: 31 meq/L (ref 19–32)
Calcium: 9.8 mg/dL (ref 8.4–10.5)
Chloride: 109 meq/L (ref 96–112)
Creatinine, Ser: 0.91 mg/dL (ref 0.40–1.20)
GFR: 62.6 mL/min (ref >60.00)
Glucose, Bld: 73 mg/dL (ref 70–99)
Potassium: 3.5 meq/L (ref 3.5–5.1)
Sodium: 145 meq/L (ref 135–145)

## 2023-10-22 LAB — HEMOGLOBIN: Hemoglobin: 12.9 g/dL (ref 12.0–15.0)

## 2023-10-23 ENCOUNTER — Ambulatory Visit (INDEPENDENT_AMBULATORY_CARE_PROVIDER_SITE_OTHER): Payer: PPO | Admitting: Internal Medicine

## 2023-10-23 ENCOUNTER — Encounter: Payer: Self-pay | Admitting: Internal Medicine

## 2023-10-23 VITALS — BP 108/68 | HR 73 | Temp 97.7°F | Ht 60.0 in | Wt 97.0 lb

## 2023-10-23 DIAGNOSIS — R251 Tremor, unspecified: Secondary | ICD-10-CM

## 2023-10-23 DIAGNOSIS — K921 Melena: Secondary | ICD-10-CM | POA: Diagnosis not present

## 2023-10-23 DIAGNOSIS — R197 Diarrhea, unspecified: Secondary | ICD-10-CM | POA: Diagnosis not present
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
## 2023-10-23 NOTE — Patient Instructions (Signed)
     A referral was ordered Neurology at [facility] and someone will call you to schedule an appointment.
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
## 2023-10-26 ENCOUNTER — Other Ambulatory Visit: Payer: Self-pay | Admitting: Cardiovascular Disease

## 2023-10-30 ENCOUNTER — Telehealth: Payer: Self-pay | Admitting: Gastroenterology

## 2023-10-30 NOTE — Telephone Encounter (Signed)
Patient is advised of negative GI pathogen and normal bloodwork. She states that she is feeling better than she was last week. Patient continues to say that she is "allergic to so much, I think the miralax was causing me to have dark stool and not be able to stop going." Wants to know what else she can take other than miralax. I have suggested she use benefiber/fiber supplement daily. She verbalizes understanding.

## 2023-10-30 NOTE — Telephone Encounter (Signed)
Dottie can you let this patient know her stool test for GI pathogen panel / D fiff is negative.   I hope she is feeling better since when she last called. I also sent her a mychart message that her labs looked good. I do not think the dark stool represented blood given the lab result. Thanks

## 2023-11-07 NOTE — Telephone Encounter (Signed)
Inbound call from patient requesting an urgent call back regarding previous note. Please advise, thank you.

## 2023-11-07 NOTE — Telephone Encounter (Addendum)
Patient calls today stating that over the last week or so, she has continued having diarrhea with fecal incontinence multiple times per day and cannot continue to "go on like this." Chart review indicates that diarrhea has been occurring for sometime. Patient states that she has abdominal discomfort at times, no fever. Rectum is "raw" from wiping so often. No further complaints of black stool. Recent stool studies negative, recent blood work okay. Patient does have a history of lymphocytic colitis 2021.   When asked if patient is taking fiber supplement, she states that she has not been doing this. I again suggested benefiber to bulk her stools. Patient also confirms she is no longer taking miralax and says that Dr Adela Lank previously stopped her linzess 145 mcg as well.  Any additional recommendations for patient or would office visit make it easier to sort out?

## 2023-11-07 NOTE — Telephone Encounter (Signed)
Left message for patient to call back  

## 2023-11-07 NOTE — Telephone Encounter (Signed)
Sorry to hear this.  She has had suspected overflow in the past that she has had significant stool burden and constipation prior to these episodes of diarrhea.  Not sure if she recently was constipated again and then the diarrhea came back?  If she has had no constipation and just solely diarrhea that has persisted, with negative stool testing for C. difficile and GI pathogen panel etc., wonder if this could be her microscopic colitis causing symptoms.  She is definitely prone to constipation.  Am not sure if she is tried Imodium.  We could try her on empiric budesonide 9 mg daily for a few week course just to see if that provides any benefit.  Alternatively if she did not want to use a steroid we could give bismuth / peptobismol 2 tabs (262 mg each) 4 times a day for a few days first to see if that helps.  Can you let me know what she thinks?  I think we should try either empiric budesonide or bismuth if she is having frequent loose stools with incontinence etc. Thanks

## 2023-11-07 NOTE — Telephone Encounter (Signed)
PT is calling to discuss her severe diarrhea. She explained that she is very fatigued and just has no way to even continue this. She is looking for options for relief. Please advise.

## 2023-11-08 NOTE — Telephone Encounter (Signed)
Left message for patient to call back  

## 2023-11-11 ENCOUNTER — Other Ambulatory Visit: Payer: Self-pay | Admitting: Cardiovascular Disease

## 2023-11-11 MED ORDER — BUDESONIDE 3 MG PO CPEP: 9.0000 mg | ORAL_CAPSULE | Freq: Every day | ORAL | 0 refills | Status: DC

## 2023-11-11 NOTE — Addendum Note (Signed)
Addended by: Richardson Chiquito on: 11/11/2023 03:48 PM   Modules accepted: Orders

## 2023-11-11 NOTE — Telephone Encounter (Signed)
Okay thanks. Let's try budesonide 9mg  / day for 30 day trial. If no benefit after a few weeks she should let us know. If this does work well for her, will likely give her a longer taper, but want to see if it helps prior to doing that. Thanks

## 2023-11-11 NOTE — Telephone Encounter (Signed)
Patient advised. Rx sent. 

## 2023-11-11 NOTE — Telephone Encounter (Signed)
I have spoken to patient who states that she continues having fecal incontinence and large amounts of loose/diarrheal stools. She is agreeable to trying a course of budesonide to see if this is helpful. Dr Adela Lank, please provide sig for budesonide.Marland KitchenMarland Kitchen

## 2023-11-11 NOTE — Telephone Encounter (Signed)
Left message for patient to call back  

## 2023-11-23 ENCOUNTER — Other Ambulatory Visit: Payer: Self-pay | Admitting: Cardiovascular Disease

## 2023-11-25 NOTE — Telephone Encounter (Signed)
 PT says that the Budesonide is not working for her. She said that it actually makes her lose balance and gives her severe diarrhea. Please advise.

## 2023-11-25 NOTE — Telephone Encounter (Signed)
 OKay sorry to hear this. She should stop the budesonide  if not helping.  I think may be best to see her in the office if her symptoms are persisting like this, either with me or APP. In the interim can try some peptobismol / bismuth if she hasn't tried that yet - she can try a few tabs at a time, a few times daily, to see if that will help. Can you clarify if she has taken anything else for this yet? Immodium?

## 2023-11-25 NOTE — Telephone Encounter (Signed)
 Returned call to patient. Pt has been advised to d/c Budesonide  at this time. Pt states that she can't remember if she has tried Imodium or not. I advised pt that we will need to see her in the office for assessment. I offered pt an appt this week, but she was not able to take it due to it being an early morning appt. Pt has been scheduled for a f/u on 12/02/23 at 10 am with Elida Shawl, NP. Pt has been advised in the interim she should try the Pepto bismol tablets 2-3 tabs up to 3 times a day or Imodium. Pt verbalized understanding and had no concerns at the end of the call.

## 2023-11-25 NOTE — Addendum Note (Signed)
 Addended by: Missy Sabins on: 11/25/2023 03:29 PM   Modules accepted: Orders

## 2023-12-02 ENCOUNTER — Ambulatory Visit: Payer: PPO | Admitting: Nurse Practitioner

## 2023-12-06 ENCOUNTER — Ambulatory Visit: Payer: PPO | Admitting: Nurse Practitioner

## 2023-12-06 ENCOUNTER — Encounter: Payer: Self-pay | Admitting: Nurse Practitioner

## 2023-12-06 ENCOUNTER — Other Ambulatory Visit (INDEPENDENT_AMBULATORY_CARE_PROVIDER_SITE_OTHER): Payer: PPO

## 2023-12-06 VITALS — BP 90/70 | HR 72 | Ht 64.0 in | Wt 97.5 lb

## 2023-12-06 DIAGNOSIS — K5900 Constipation, unspecified: Secondary | ICD-10-CM

## 2023-12-06 DIAGNOSIS — K5641 Fecal impaction: Secondary | ICD-10-CM | POA: Diagnosis not present

## 2023-12-06 DIAGNOSIS — R634 Abnormal weight loss: Secondary | ICD-10-CM

## 2023-12-06 LAB — CBC WITH DIFFERENTIAL/PLATELET
Basophils Absolute: 0.1 10*3/uL (ref 0.0–0.1)
Basophils Relative: 0.9 % (ref 0.0–3.0)
Eosinophils Absolute: 0.1 10*3/uL (ref 0.0–0.7)
Eosinophils Relative: 1.2 % (ref 0.0–5.0)
HCT: 39.8 % (ref 36.0–46.0)
Hemoglobin: 13.1 g/dL (ref 12.0–15.0)
Lymphocytes Relative: 29.8 % (ref 12.0–46.0)
Lymphs Abs: 1.7 10*3/uL (ref 0.7–4.0)
MCHC: 32.9 g/dL (ref 30.0–36.0)
MCV: 92.6 fL (ref 78.0–100.0)
Monocytes Absolute: 0.4 10*3/uL (ref 0.1–1.0)
Monocytes Relative: 6.5 % (ref 3.0–12.0)
Neutro Abs: 3.5 10*3/uL (ref 1.4–7.7)
Neutrophils Relative %: 61.6 % (ref 43.0–77.0)
Platelets: 229 10*3/uL (ref 150.0–400.0)
RBC: 4.3 Mil/uL (ref 3.87–5.11)
RDW: 13.1 % (ref 11.5–15.5)
WBC: 5.6 10*3/uL (ref 4.0–10.5)

## 2023-12-06 LAB — COMPREHENSIVE METABOLIC PANEL
ALT: 22 U/L (ref 0–35)
AST: 18 U/L (ref 0–37)
Albumin: 4.3 g/dL (ref 3.5–5.2)
Alkaline Phosphatase: 45 U/L (ref 39–117)
BUN: 25 mg/dL — ABNORMAL HIGH (ref 6–23)
CO2: 30 meq/L (ref 19–32)
Calcium: 10.1 mg/dL (ref 8.4–10.5)
Chloride: 107 meq/L (ref 96–112)
Creatinine, Ser: 1.05 mg/dL (ref 0.40–1.20)
GFR: 52.67 mL/min — ABNORMAL LOW (ref >60.00)
Glucose, Bld: 63 mg/dL — ABNORMAL LOW (ref 70–99)
Potassium: 4.1 meq/L (ref 3.5–5.1)
Sodium: 145 meq/L (ref 135–145)
Total Bilirubin: 0.4 mg/dL (ref 0.2–1.2)
Total Protein: 7 g/dL (ref 6.0–8.3)

## 2023-12-06 LAB — TSH: TSH: 0.55 u[IU]/mL (ref 0.35–5.50)

## 2023-12-06 MED ORDER — SMOG ENEMA: 960.0000 mL | Freq: Once | RECTAL | 0 refills | Status: AC
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
## 2023-12-06 NOTE — Progress Notes (Signed)
Agree with assessment and plan as outlined.  Sounds like fecal impaction with overflow incontinence.  She is quite frail, needs assistance with getting on the bed, but anticipate sedation needed for disimpaction for her.  I recommended she go to the ED for disimpaction as they are better equipped to deal with that than our office staff.  It sounds like she has declined that.  Agree with smog enema at home or mineral oil enemas.  If no improvement she will need to go to the ED for disimpaction.

## 2023-12-06 NOTE — Patient Instructions (Addendum)
Your provider has requested that you go to the basement level for lab work before leaving today. Press "B" on the elevator. The lab is located at the first door on the left as you exit the elevator.  Miralax- take every night as tolerated  Dulcolax suppositories- take 1 per rectum at night for 3 nights (over the counter)  Stop Pepto bismol.  Go to the emergency room if declines.  Due to recent changes in healthcare laws, you may see the results of your imaging and laboratory studies on MyChart before your provider has had a chance to review them.  We understand that in some cases there may be results that are confusing or concerning to you. Not all laboratory results come back in the same time frame and the provider may be waiting for multiple results in order to interpret others.  Please give [place] 48 hours in order for your provider to thoroughly review all the results before contacting the office for clarification of your results.   Thank you for trusting me with your gastrointestinal care!   Alcide Evener, CRNP

## 2023-12-10 ENCOUNTER — Telehealth: Payer: Self-pay | Admitting: Nurse Practitioner

## 2023-12-10 NOTE — Telephone Encounter (Signed)
Spoke with pt.  Documented in result notes. Pt verbalized understanding with all questions answered.

## 2023-12-10 NOTE — Progress Notes (Unsigned)
Assessment/Plan:   1.  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  -she thinks that levodopa has been of some value and decided to trial increase to 2/1/1 -may look at paraneoplastic panel in the future for CRMP5 and anti Hu Ab, but yield is very low given the amount of time that this has been going on. -EMG neg/normal -Acetylcholine receptor antibodies negative. -Neuromuscular evaluation has been negative. -She has had a movement evaluation at [facility], without further recommendations. -She is willing to go to [facility].     Subjective:   Kara Ramirez was seen today in follow up for progressive ataxia.  It has been nearly 2 years since I have seen the patient.  I have reviewed records since last visit.  She saw her primary care physician on December 4 and looks like she requested a referral to Dr. Rubin Payor.  I see that a referral was put in, but I do not see that an appointment was made.  No mention is made of the referral and care everywhere either, which is how they process referrals.  PREVIOUS MEDICATIONS: VPA (helped myoclonus but pt thought caused mood issues); keppra (also with mood issues); levodopa  CURRENT MEDICATIONS:  Outpatient Encounter Medications as of 12/12/2023  Medication Sig   acetaminophen (TYLENOL) 325 MG tablet Take 1-2 tablets (325-650 mg total) by mouth every 4 (four) hours as needed for mild pain.   albuterol (VENTOLIN HFA) 108 (90 Base) MCG/ACT inhaler INHALE 1 OR 2 PUFFS INTO THE LUNGS EVERY 6 HOURS AS NEEDED FOR WHEEZING OR SHORTNESS OF BREATH   alendronate (FOSAMAX) 70 MG tablet Take by mouth.    Apoaequorin (PREVAGEN PO) Take 1 tablet by mouth daily.   aspirin-acetaminophen-caffeine (EXCEDRIN MIGRAINE) 250-250-65 MG tablet Take 1 tablet by mouth every 6 (six) hours as needed for headache.   azelastine (ASTELIN) 0.1 % nasal spray Place 1 spray into both nostrils 2 (two) times daily. Use in each nostril as directed   bismuth subsalicylate (PEPTO BISMOL) 262 MG chewable tablet Chew 4 tablets by mouth 2 (two) times daily.   Budeson-Glycopyrrol-Formoterol (BREZTRI AEROSPHERE) 160-9-4.8 MCG/ACT AERO Inhale 2 puffs into the lungs in the morning and at bedtime.   Calcium Carbonate-Vitamin D 600-400 MG-UNIT tablet Take 1 tablet by mouth daily.   Carboxymethylcellul-Glycerin 1-0.9 % GEL Place 1 application into both eyes at bedtime.   Co-Enzyme Q-10 100 MG CAPS Take 100 mg by mouth daily.    divalproex (DEPAKOTE ER) 250 MG 24 hr tablet SMARTSIG:1 Tablet(s) By Mouth   divalproex (DEPAKOTE ER) 500 MG 24 hr tablet SMARTSIG:1 Tablet(s) By Mouth   Eyelid Cleansers (STERILID) FOAM Apply topically.   ezetimibe (ZETIA) 10 MG tablet TAKE 1 TABLET BY MOUTH DAILY   famotidine (PEPCID) 20 MG tablet TAKE 1 TABLET BY MOUTH DAILY (Patient not taking: Reported on 12/06/2023)   fluocinonide ointment (LIDEX) 0.05 % Apply 1 application topically See admin instructions. Apply topically twice daily for 5 days alternating with Tacrolimus ointment   fluticasone (FLONASE) 50 MCG/ACT nasal spray Place 2 sprays into both nostrils daily.   ipratropium (ATROVENT) 0.03 % nasal spray Place 1 spray into both nostrils 3 (three) times daily  as needed for rhinitis.   linaclotide (LINZESS) 145 MCG CAPS capsule Take 1 capsule (145 mcg total) by mouth daily before breakfast. [id number], Exp: 01-2023 (Patient not taking: Reported on 12/06/2023)   loratadine (CLARITIN) 10 MG tablet Take 1 tablet (10 mg total) by mouth daily.   MALIC ACID PO Take 1 capsule by mouth at bedtime.   metoprolol tartrate (LOPRESSOR) 25 MG tablet Take 0.5 tablets  (12.5 mg total) by mouth 2 (two) times daily.   mirabegron ER (MYRBETRIQ) 50 MG TB24 tablet Take 50 mg by mouth daily as needed.   Misc. Devices (ROLLER WALKER) MISC Rolling walker with seat. Dx:G20, R26.81   montelukast (SINGULAIR) 5 MG chewable tablet Chew 2 tablets (10 mg total) by mouth at bedtime.   naproxen sodium (ALEVE) 220 MG tablet Take 220 mg by mouth daily as needed.    Olopatadine HCl 0.2 % SOLN Apply to eye 2 (two) times daily as needed.   Omega-3 Fatty Acids (FISH OIL) 1000 MG CAPS Take 1,000 mg by mouth at bedtime.    pravastatin (PRAVACHOL) 40 MG tablet Take 1 tablet (40 mg total) by mouth daily.   sodium chloride (OCEAN) 0.65 % SOLN nasal spray Place 1 spray into both nostrils as needed for congestion. (Patient taking differently: Place 1 spray into both nostrils 4 (four) times daily as needed for congestion.)   tacrolimus (PROTOPIC) 0.1 % ointment Apply 1 application topically 2 (two) times daily as needed (PRN skin issues). (Patient taking differently: Apply 1 application  topically See admin instructions. Apply topically twice daily for 5 days alternating with Fluocinonide ointment)   Thiamine HCl (VITAMIN B-1) 100 MG tablet Take 100 mg by mouth daily.   triamcinolone ointment (KENALOG) 0.5 % Apply 1 application topically 2 (two) times daily. For rash on left lower leg   No facility-administered encounter medications on file as of 12/12/2023.     Objective:   PHYSICAL EXAMINATION:    VITALS:   There were no vitals filed for this visit.      GEN:  The patient appears stated age and is in NAD. HEENT:  Normocephalic, atraumatic.  The mucous membranes are moist. The superficial temporal arteries are without ropiness or tenderness. CV:  RRR Lungs:  CTAB Neck/HEME:  There are no carotid bruits bilaterally.  Neurological examination:  Orientation: The patient is alert and oriented x3. Cranial nerves: There is good facial symmetry.The speech is fluent and clear. Soft  palate rises symmetrically and there is no tongue deviation. Hearing is intact to conversational tone. Sensation: Sensation is intact to light touch throughout Motor: Strength is decreased grasp on the L.  No m fasciculations noted.    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  She has some tremulous motion in the hands, particularly the fingers that is separate from the myoclonus. Coordination:  There is slow with RAMs on the L but no decremation.  She talks with her hands but then is very slow when doing RAMs Gait and Station: The patient is slow and tenuous with ambulation.  She has her rollator today.  She is short stepped.  Without the walker, she is wide-based and more ataxic.  She drags the legs a bit   Total time spent on today's visit was *** minutes, including both face-to-face time and nonface-to-face time.  Time included that spent on review of records (prior notes available to me/labs/imaging if pertinent), discussing treatment and goals, answering  patient's questions and coordinating care.    Cc:  Pincus Sanes, MD

## 2023-12-10 NOTE — Telephone Encounter (Signed)
Left message for pt to call back  °

## 2023-12-10 NOTE — Telephone Encounter (Signed)
Patient is calling back regarding her lab results. Patient is requesting a call back. Please advise.

## 2023-12-12 ENCOUNTER — Encounter: Payer: Self-pay | Admitting: Neurology

## 2023-12-12 ENCOUNTER — Other Ambulatory Visit: Payer: Self-pay | Admitting: Neurology

## 2023-12-12 ENCOUNTER — Ambulatory Visit (INDEPENDENT_AMBULATORY_CARE_PROVIDER_SITE_OTHER): Payer: PPO | Admitting: Neurology

## 2023-12-12 VITALS — BP 108/66 | HR 67 | Wt 99.2 lb

## 2023-12-12 DIAGNOSIS — G255 Other chorea: Secondary | ICD-10-CM | POA: Diagnosis not present

## 2023-12-12 DIAGNOSIS — R2681 Unsteadiness on feet: Secondary | ICD-10-CM | POA: Diagnosis not present

## 2023-12-20 LAB — HUNTINGTON DISEASE REPEAT EXP

## 2023-12-23 ENCOUNTER — Telehealth: Payer: Self-pay | Admitting: Neurology

## 2023-12-23 NOTE — Telephone Encounter (Signed)
Let patient know that as suspected, the results of her lab testing for HD was negative/normal.  This is great news!

## 2023-12-23 NOTE — Telephone Encounter (Signed)
Call patient with the above information [facility] - [facility] 4th Floor [facility] [place], [place] [address] [phone])

## 2023-12-23 NOTE — Telephone Encounter (Signed)
Pt LM asking if Tat had heard from anyone about an appt for her shaking. She was supposed to have a referral sent some where

## 2023-12-24 NOTE — Telephone Encounter (Signed)
Called patient left voicemail message

## 2023-12-24 NOTE — Telephone Encounter (Signed)
Left a VM wanting to get the Test results  please call

## 2023-12-25 ENCOUNTER — Telehealth: Payer: Self-pay | Admitting: Neurology

## 2023-12-25 NOTE — Telephone Encounter (Signed)
 Called and left voicemail as requested with test results and referral to [facility] number

## 2023-12-25 NOTE — Telephone Encounter (Signed)
 Pt's husband called back in. He is requesting the results be left on the voicemail since they keep missing the phone calls.

## 2023-12-25 NOTE — Telephone Encounter (Signed)
Called pateint and left message  

## 2023-12-27 ENCOUNTER — Ambulatory Visit: Payer: PPO | Admitting: Internal Medicine

## 2023-12-30 DIAGNOSIS — Z1231 Encounter for screening mammogram for malignant neoplasm of breast: Secondary | ICD-10-CM | POA: Diagnosis not present

## 2023-12-30 DIAGNOSIS — Z01419 Encounter for gynecological examination (general) (routine) without abnormal findings: Secondary | ICD-10-CM | POA: Diagnosis not present

## 2023-12-30 LAB — HM MAMMOGRAPHY

## 2024-01-02 ENCOUNTER — Other Ambulatory Visit: Payer: Self-pay

## 2024-01-02 ENCOUNTER — Ambulatory Visit (INDEPENDENT_AMBULATORY_CARE_PROVIDER_SITE_OTHER): Payer: PPO | Admitting: Internal Medicine

## 2024-01-02 VITALS — BP 120/88 | HR 66 | Temp 97.7°F | Resp 12 | Ht 64.0 in | Wt 99.0 lb

## 2024-01-02 DIAGNOSIS — J4530 Mild persistent asthma, uncomplicated: Secondary | ICD-10-CM | POA: Diagnosis not present

## 2024-01-02 DIAGNOSIS — J310 Chronic rhinitis: Secondary | ICD-10-CM

## 2024-01-02 MED ORDER — BREZTRI AEROSPHERE 160-9-4.8 MCG/ACT IN AERO: 1.0000 | INHALATION_SPRAY | Freq: Two times a day (BID) | RESPIRATORY_TRACT | 5 refills | Status: DC

## 2024-01-02 MED ORDER — IPRATROPIUM BROMIDE 0.03 % NA SOLN: 1.0000 | Freq: Three times a day (TID) | NASAL | 5 refills | Status: DC | PRN

## 2024-01-02 MED ORDER — AZELASTINE HCL 0.1 % NA SOLN: 1.0000 | Freq: Two times a day (BID) | NASAL | 5 refills | Status: DC

## 2024-01-02 MED ORDER — MONTELUKAST SODIUM 5 MG PO CHEW: 10.0000 mg | CHEWABLE_TABLET | Freq: Every day | ORAL | 5 refills | Status: DC

## 2024-01-02 MED ORDER — FLUTICASONE PROPIONATE 50 MCG/ACT NA SUSP: 2.0000 | Freq: Every day | NASAL | 5 refills | Status: DC

## 2024-01-02 MED ORDER — ALBUTEROL SULFATE HFA 108 (90 BASE) MCG/ACT IN AERS
1.0000 | INHALATION_SPRAY | Freq: Four times a day (QID) | RESPIRATORY_TRACT | 1 refills | Status: DC | PRN
Start: 2024-01-02 — End: 2024-07-15

## 2024-01-02 NOTE — Patient Instructions (Addendum)
Mixed Rhinitis: - SPT 07/2019: positive to grasses, mold  - Use nasal saline rinses before nose sprays such as with Neilmed Sinus Rinse.  Use distilled water.   - Use Flonase 2 sprays each nostril daily. Aim upward and outward. - Use Azelastine 1-2 sprays each nostril twice daily. Aim upward and outward. - Use Ipratropium 1-2 sprays up to three times daily for runny nose.  Can cause dryness.  - Use Claritin 10 mg daily.  - Use Singulair 10mg  daily.  - Avoid any nasal decongestant (like Afrin, Oxymetazoline).   Mild Persistent Asthma: - Continue Breztri 1 puff twice a day with spacer and rinse mouth afterwards.  Continue Singulair 10mg  daily. - May use albuterol rescue inhaler 1-2 puffs every 4 to 6 hours as needed for shortness of breath, chest tightness, coughing, and wheezing.
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
## 2024-01-10 ENCOUNTER — Telehealth: Payer: Self-pay

## 2024-01-10 NOTE — Telephone Encounter (Signed)
Copied from CRM [id number]. Topic: Referral - Status >> Jan 10, 2024  2:38 PM Elizebeth Brooking wrote: Reason for CRM: Patient husband called in regarding referral to   Jaquita Folds, MD stated he called to get patient schedule and they stated they never received the referral, he is call wanting to get that resent so they can schedule an appointment

## 2024-01-25 ENCOUNTER — Other Ambulatory Visit: Payer: Self-pay | Admitting: Cardiovascular Disease

## 2024-02-04 ENCOUNTER — Telehealth: Payer: Self-pay | Admitting: Nurse Practitioner

## 2024-02-04 NOTE — Telephone Encounter (Signed)
 Left message for pt to call back.  Pt states she has been having a bubbling sensation and discomfort on her right side. Discussed with pt that she could have gas and can try gas-x, phazyme, or gaviscon as the box states. Pt verbalized understanding.

## 2024-02-04 NOTE — Telephone Encounter (Signed)
 Patient called and stated that she was having a lot of abdominal pain as well as pain on her left side.Patient also stated that she was having a bubbling feeling and does not know why. Patient is requesting a call back. Please advise.

## 2024-02-07 ENCOUNTER — Telehealth: Payer: Self-pay | Admitting: Neurology

## 2024-02-07 NOTE — Telephone Encounter (Signed)
 Pt called to let Dr.Tat know [facility] needs a referral sent to them so she can schedule an appt. She is still shaking all over and she can't walk half of the time. She is very frustrated and doesn't know what to do.

## 2024-02-10 NOTE — Telephone Encounter (Signed)
 Patient is calling back about this referral she does not know if Dr Tat needs to see her. Please call

## 2024-02-11 NOTE — Telephone Encounter (Signed)
 Called patient and left detailed voicemail regarding sending her somewhere other than [facility]. We have  already  sent her to [facility] to see Dr. Rubin Payor she saw and [facility]  she saw Dr. Morene Rankins so the only one left would be [facility]

## 2024-02-12 ENCOUNTER — Other Ambulatory Visit: Payer: Self-pay

## 2024-02-12 DIAGNOSIS — G20C Parkinsonism, unspecified: Secondary | ICD-10-CM

## 2024-02-12 DIAGNOSIS — R251 Tremor, unspecified: Secondary | ICD-10-CM

## 2024-02-12 DIAGNOSIS — G255 Other chorea: Secondary | ICD-10-CM

## 2024-02-12 DIAGNOSIS — R2681 Unsteadiness on feet: Secondary | ICD-10-CM

## 2024-02-12 DIAGNOSIS — R270 Ataxia, unspecified: Secondary | ICD-10-CM

## 2024-02-12 NOTE — Telephone Encounter (Signed)
 Called patient and spoke to her about referral she is now saying that it is [facility] [place] [employer]

## 2024-02-13 NOTE — Telephone Encounter (Signed)
 Sent referral to [facility] I will keep checking on it

## 2024-02-16 NOTE — Progress Notes (Unsigned)
 Subjective:    Patient ID: Kara Ramirez, female    DOB: 1951/06/28, 73 y.o.   MRN: [id number]      HPI Kara Ramirez is here for No chief complaint on file.        Medications and allergies reviewed with patient and updated if appropriate.  Current Outpatient Medications on File Prior to Visit  Medication Sig Dispense Refill   acetaminophen (TYLENOL) 325 MG tablet Take 1-2 tablets (325-650 mg total) by mouth every 4 (four) hours as needed for mild pain.     albuterol (VENTOLIN HFA) 108 (90 Base) MCG/ACT inhaler Inhale 1-2 puffs into the lungs every 6 (six) hours as needed for wheezing or shortness of breath. 8.5 g 1   alendronate (FOSAMAX) 70 MG tablet Take by mouth.     Apoaequorin (PREVAGEN PO) Take 1 tablet by mouth daily.     aspirin-acetaminophen-caffeine (EXCEDRIN MIGRAINE) 250-250-65 MG tablet Take 1 tablet by mouth every 6 (six) hours as needed for headache.     azelastine (ASTELIN) 0.1 % nasal spray Place 1 spray into both nostrils 2 (two) times daily. Use in each nostril as directed 30 mL 5   bismuth subsalicylate (PEPTO BISMOL) 262 MG chewable tablet Chew 4 tablets by mouth 2 (two) times daily.     Budeson-Glycopyrrol-Formoterol (BREZTRI AEROSPHERE) 160-9-4.8 MCG/ACT AERO Inhale 1 puff into the lungs in the morning and at bedtime. 10.7 g 5   Calcium Carbonate-Vitamin D 600-400 MG-UNIT tablet Take 1 tablet by mouth daily.     Carboxymethylcellul-Glycerin 1-0.9 % GEL Place 1 application into both eyes at bedtime.     Co-Enzyme Q-10 100 MG CAPS Take 100 mg by mouth daily.      divalproex (DEPAKOTE ER) 250 MG 24 hr tablet SMARTSIG:1 Tablet(s) By Mouth     divalproex (DEPAKOTE ER) 500 MG 24 hr tablet SMARTSIG:1 Tablet(s) By Mouth     Eyelid Cleansers (STERILID) FOAM Apply topically.     ezetimibe (ZETIA) 10 MG tablet TAKE 1 TABLET BY MOUTH DAILY 90 tablet 3   famotidine (PEPCID) 20 MG tablet TAKE 1 TABLET BY MOUTH DAILY 90 tablet 1   fluocinonide ointment (LIDEX) 0.05 %  Apply 1 application topically See admin instructions. Apply topically twice daily for 5 days alternating with Tacrolimus ointment     fluticasone (FLONASE) 50 MCG/ACT nasal spray Place 2 sprays into both nostrils daily. 16 g 5   ipratropium (ATROVENT) 0.03 % nasal spray Place 1 spray into both nostrils 3 (three) times daily as needed for rhinitis. 30 mL 5   linaclotide (LINZESS) 145 MCG CAPS capsule Take 1 capsule (145 mcg total) by mouth daily before breakfast. [id number], Exp: 01-2023 30 capsule 2   loratadine (CLARITIN) 10 MG tablet Take 1 tablet (10 mg total) by mouth daily. 30 tablet 5   MALIC ACID PO Take 1 capsule by mouth at bedtime.     metoprolol tartrate (LOPRESSOR) 25 MG tablet TAKE ONE-HALF (0.5) TABLET BY MOUTH 2 TIMES A DAY 90 tablet 2   mirabegron ER (MYRBETRIQ) 50 MG TB24 tablet Take 50 mg by mouth daily as needed.     Misc. Devices (ROLLER WALKER) MISC Rolling walker with seat. Dx:G20, R26.81 1 each 0   montelukast (SINGULAIR) 5 MG chewable tablet Chew 2 tablets (10 mg total) by mouth at bedtime. 60 tablet 5   naproxen sodium (ALEVE) 220 MG tablet Take 220 mg by mouth daily as needed.      Olopatadine HCl 0.2 % SOLN Apply  to eye 2 (two) times daily as needed.     Omega-3 Fatty Acids (FISH OIL) 1000 MG CAPS Take 1,000 mg by mouth at bedtime.      pravastatin (PRAVACHOL) 40 MG tablet Take 1 tablet (40 mg total) by mouth daily. 90 tablet 3   sodium chloride (OCEAN) 0.65 % SOLN nasal spray Place 1 spray into both nostrils as needed for congestion. (Patient taking differently: Place 1 spray into both nostrils 4 (four) times daily as needed for congestion.)  0   tacrolimus (PROTOPIC) 0.1 % ointment Apply 1 application topically 2 (two) times daily as needed (PRN skin issues). (Patient taking differently: Apply 1 application  topically See admin instructions. Apply topically twice daily for 5 days alternating with Fluocinonide ointment) 100 g 0   Thiamine HCl (VITAMIN B-1) 100 MG tablet Take  100 mg by mouth daily.     triamcinolone ointment (KENALOG) 0.5 % Apply 1 application topically 2 (two) times daily. For rash on left lower leg 30 g 0   No current facility-administered medications on file prior to visit.    Review of Systems     Objective:  There were no vitals filed for this visit. BP Readings from Last 3 Encounters:  01/02/24 120/88  12/12/23 108/66  12/06/23 90/70   Wt Readings from Last 3 Encounters:  01/02/24 99 lb (44.9 kg)  12/12/23 99 lb 3.2 oz (45 kg)  12/06/23 97 lb 8 oz (44.2 kg)   There is no height or weight on file to calculate BMI.    Physical Exam         Assessment & Plan:    See Problem List for Assessment and Plan of chronic medical problems.

## 2024-02-17 ENCOUNTER — Ambulatory Visit: Payer: PPO | Admitting: Neurology

## 2024-02-17 ENCOUNTER — Encounter: Payer: Self-pay | Admitting: Internal Medicine

## 2024-02-17 ENCOUNTER — Ambulatory Visit (INDEPENDENT_AMBULATORY_CARE_PROVIDER_SITE_OTHER): Admitting: Internal Medicine

## 2024-02-17 VITALS — BP 112/66 | HR 70 | Temp 97.6°F | Ht 64.0 in | Wt 97.0 lb

## 2024-02-17 DIAGNOSIS — G20C Parkinsonism, unspecified: Secondary | ICD-10-CM | POA: Insufficient documentation

## 2024-02-17 NOTE — Patient Instructions (Addendum)
    Continue to call [facility] to see if they have gotten the referral and try to schedule an appointment.    [facility] - [facility] 4th Floor [facility] [place], [place] [address] [phone]

## 2024-02-17 NOTE — Telephone Encounter (Signed)
 [facility] they had not received referral they gave me a new fax number and I have resent referral

## 2024-02-17 NOTE — Telephone Encounter (Signed)
 Called Friday to check on Referral they did not have but resent and will check again

## 2024-02-17 NOTE — Assessment & Plan Note (Signed)
 Chronic Following with neurology-Dr. Tat Has had numerous tests and fortunately Parkinson's disease, Huntington's chorea have been ruled out Diagnosed with myoclonus ataxia Has not been able to tolerate certain medications ?  Symptoms are related to TBI Refer to neurology at [facility] and awaiting their appointment Discussed with her that I do not think there is anything that I can do to help her unfortunately

## 2024-02-19 NOTE — Telephone Encounter (Signed)
 Called [facility] to check on referral I will need to resend

## 2024-02-21 NOTE — Telephone Encounter (Signed)
 [facility] has received the referral nad will be calling the patient

## 2024-02-25 ENCOUNTER — Telehealth: Payer: Self-pay | Admitting: Neurology

## 2024-02-25 NOTE — Telephone Encounter (Signed)
 Pts husband called to let Leeroy Bock know [facility] 06/10/24

## 2024-04-15 DIAGNOSIS — D1801 Hemangioma of skin and subcutaneous tissue: Secondary | ICD-10-CM | POA: Diagnosis not present

## 2024-04-15 DIAGNOSIS — D225 Melanocytic nevi of trunk: Secondary | ICD-10-CM | POA: Diagnosis not present

## 2024-04-15 DIAGNOSIS — L821 Other seborrheic keratosis: Secondary | ICD-10-CM | POA: Diagnosis not present

## 2024-05-04 DIAGNOSIS — N3281 Overactive bladder: Secondary | ICD-10-CM | POA: Diagnosis not present

## 2024-06-10 DIAGNOSIS — Z789 Other specified health status: Secondary | ICD-10-CM | POA: Diagnosis not present

## 2024-06-10 DIAGNOSIS — R4189 Other symptoms and signs involving cognitive functions and awareness: Secondary | ICD-10-CM | POA: Diagnosis not present

## 2024-06-10 DIAGNOSIS — R270 Ataxia, unspecified: Secondary | ICD-10-CM | POA: Diagnosis not present

## 2024-06-10 DIAGNOSIS — M6281 Muscle weakness (generalized): Secondary | ICD-10-CM | POA: Diagnosis not present

## 2024-06-10 DIAGNOSIS — R634 Abnormal weight loss: Secondary | ICD-10-CM | POA: Diagnosis not present

## 2024-06-10 DIAGNOSIS — R278 Other lack of coordination: Secondary | ICD-10-CM | POA: Diagnosis not present

## 2024-06-10 DIAGNOSIS — R2689 Other abnormalities of gait and mobility: Secondary | ICD-10-CM | POA: Diagnosis not present

## 2024-06-10 DIAGNOSIS — Z7409 Other reduced mobility: Secondary | ICD-10-CM | POA: Diagnosis not present

## 2024-06-15 ENCOUNTER — Ambulatory Visit: Admitting: Gastroenterology

## 2024-06-15 ENCOUNTER — Encounter: Payer: Self-pay | Admitting: Gastroenterology

## 2024-06-15 VITALS — BP 102/62 | HR 98 | Ht 64.0 in | Wt 97.0 lb

## 2024-06-15 DIAGNOSIS — R634 Abnormal weight loss: Secondary | ICD-10-CM

## 2024-06-15 DIAGNOSIS — K5909 Other constipation: Secondary | ICD-10-CM | POA: Diagnosis not present
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
## 2024-06-15 NOTE — Patient Instructions (Addendum)
 Continue Dulcolax daily.  Increase Miralax  to once daily.  You can titrate these as needed.  Use enemas as needed.  Please let us  know when your CT scan is and provide a copy of the report to Dr. Leigh at fax:  [phone].  Thank you for entrusting me with your care and for choosing [facility], Dr. Elspeth Leigh  ______________________________________________________  If your blood pressure at your visit was 140/90 or greater, please contact your primary care physician to follow up on this.  _______________________________________________________  If you are age 79 or older, your body mass index should be between 23-30. Your Body mass index is 16.65 kg/m. If this is out of the aforementioned range listed, please consider follow up with your Primary Care Provider.  If you are age 51 or younger, your body mass index should be between 19-25. Your Body mass index is 16.65 kg/m. If this is out of the aformentioned range listed, please consider follow up with your Primary Care Provider.   ________________________________________________________  The [facility] providers would like to encourage you to use MYCHART to communicate with providers for non-urgent requests or questions.  Due to long hold times on the telephone, sending your provider a message by [facility] may be a faster and more efficient way to get a response.  Please allow 48 business hours for a response.  Please remember that this is for non-urgent requests.  _______________________________________________________  [facility] is using a team-based approach to care.  Your team is made up of your doctor and two to three APPS. Our APPS (Nurse Practitioners and Physician Assistants) work with your physician to ensure care continuity for you. They are fully qualified to address your health concerns and develop a treatment plan. They communicate directly with your gastroenterologist to care for you. Seeing  the Advanced Practice Practitioners on your physician's team can help you by facilitating care more promptly, often allowing for earlier appointments, access to diagnostic testing, procedures, and other specialty referrals.

## 2024-06-17 NOTE — Progress Notes (Addendum)
 I saw and evaluated the patient. I reviewed the trainee's note and agree. I performed the service or was physically present during the critical or key portions of the service furnished by the trainee; and I managed the patient. Previous ataxia panel testing was negative. Today we will add on [employer] Neuronal Ceroid Lipofuscinosis Genes (via the lysosomal storage disease panel) and POLG testing ([employer] comprehensive neuromuscular panel). Her significant weight loss raises the concern for a paraneoplastic process; she will need a repeat MRI Brain, CT CAP to assess for malignancy, and paraneoplastic movement disorder panel.   ADDENDUM: CT CAP did not show clear signs of malignancy which is reassuring. MRI Brain revealed significant diffuse cerebral and cerebellar atrophy, primarily parietal lobe atrophy which can be seen in Alzheimer's as well as other neurodegenerative conditions, with mild progression from 2019.   Unfortunately there was not enough sample at the last visit to send several of the planned lab orders. At her visit on 10/22 we will resend the serum paraneoplastic panel to [facility] (test ID [id number]) and the free [employer] genetic testing (Comprehensive neuromuscular disorders and lysosomal storage disorders panel, assessing for POLG, niemman pick C, adult onset sialidosis). If this testing returns negative, the next steps would be the [employer] panel whole genome for ataxia and a lumbar puncture with CSF autoimmune/paraneoplastic testing. If she is amenable we can have her sign the [employer] consent form at her 10/22 visit and scan it in then if everything returns negative we will mail the [employer] kit for the testing.  Barabara Level, MD 06/17/2024

## 2024-06-19 DIAGNOSIS — R479 Unspecified speech disturbances: Secondary | ICD-10-CM | POA: Diagnosis not present

## 2024-06-19 DIAGNOSIS — I700 Atherosclerosis of aorta: Secondary | ICD-10-CM | POA: Diagnosis not present

## 2024-06-19 DIAGNOSIS — J984 Other disorders of lung: Secondary | ICD-10-CM | POA: Diagnosis not present

## 2024-06-19 DIAGNOSIS — I2510 Atherosclerotic heart disease of native coronary artery without angina pectoris: Secondary | ICD-10-CM | POA: Diagnosis not present

## 2024-06-19 DIAGNOSIS — Z681 Body mass index (BMI) 19 or less, adult: Secondary | ICD-10-CM | POA: Diagnosis not present

## 2024-06-19 DIAGNOSIS — Z90710 Acquired absence of both cervix and uterus: Secondary | ICD-10-CM | POA: Diagnosis not present

## 2024-06-19 DIAGNOSIS — N281 Cyst of kidney, acquired: Secondary | ICD-10-CM | POA: Diagnosis not present

## 2024-06-19 DIAGNOSIS — G319 Degenerative disease of nervous system, unspecified: Secondary | ICD-10-CM | POA: Diagnosis not present

## 2024-06-19 DIAGNOSIS — G20C Parkinsonism, unspecified: Secondary | ICD-10-CM | POA: Diagnosis not present

## 2024-06-19 DIAGNOSIS — R270 Ataxia, unspecified: Secondary | ICD-10-CM | POA: Diagnosis not present

## 2024-06-19 DIAGNOSIS — R531 Weakness: Secondary | ICD-10-CM | POA: Diagnosis not present

## 2024-06-19 DIAGNOSIS — G9389 Other specified disorders of brain: Secondary | ICD-10-CM | POA: Diagnosis not present

## 2024-06-19 DIAGNOSIS — R9089 Other abnormal findings on diagnostic imaging of central nervous system: Secondary | ICD-10-CM | POA: Diagnosis not present

## 2024-06-19 DIAGNOSIS — R634 Abnormal weight loss: Secondary | ICD-10-CM | POA: Diagnosis not present

## 2024-06-21 ENCOUNTER — Encounter: Payer: Self-pay | Admitting: Internal Medicine

## 2024-06-21 NOTE — Patient Instructions (Addendum)
 Blood work was ordered.       Medications changes include :   None    A referral was ordered and someone will call you to schedule an appointment.     Return in about 1 year (around 06/22/2025) for Physical Exam.    Health Maintenance, Female Adopting a healthy lifestyle and getting preventive care are important in promoting health and wellness. Ask your health care provider about: The right schedule for you to have regular tests and exams. Things you can do on your own to prevent diseases and keep yourself healthy. What should I know about diet, weight, and exercise? Eat a healthy diet  Eat a diet that includes plenty of vegetables, fruits, low-fat dairy products, and lean protein. Do not eat a lot of foods that are high in solid fats, added sugars, or sodium. Maintain a healthy weight Body mass index (BMI) is used to identify weight problems. It estimates body fat based on height and weight. Your health care provider can help determine your BMI and help you achieve or maintain a healthy weight. Get regular exercise Get regular exercise. This is one of the most important things you can do for your health. Most adults should: Exercise for at least 150 minutes each week. The exercise should increase your heart rate and make you sweat (moderate-intensity exercise). Do strengthening exercises at least twice a week. This is in addition to the moderate-intensity exercise. Spend less time sitting. Even light physical activity can be beneficial. Watch cholesterol and blood lipids Have your blood tested for lipids and cholesterol at 74 years of age, then have this test every 5 years. Have your cholesterol levels checked more often if: Your lipid or cholesterol levels are high. You are older than 74 years of age. You are at high risk for heart disease. What should I know about cancer screening? Depending on your health history and family history, you may need to have cancer  screening at various ages. This may include screening for: Breast cancer. Cervical cancer. Colorectal cancer. Skin cancer. Lung cancer. What should I know about heart disease, diabetes, and high blood pressure? Blood pressure and heart disease High blood pressure causes heart disease and increases the risk of stroke. This is more likely to develop in people who have high blood pressure readings or are overweight. Have your blood pressure checked: Every 3-5 years if you are 82-32 years of age. Every year if you are 28 years old or older. Diabetes Have regular diabetes screenings. This checks your fasting blood sugar level. Have the screening done: Once every three years after age 46 if you are at a normal weight and have a low risk for diabetes. More often and at a younger age if you are overweight or have a high risk for diabetes. What should I know about preventing infection? Hepatitis B If you have a higher risk for hepatitis B, you should be screened for this virus. Talk with your health care provider to find out if you are at risk for hepatitis B infection. Hepatitis C Testing is recommended for: Everyone born from [birth year] through 1966. Anyone with known risk factors for hepatitis C. Sexually transmitted infections (STIs) Get screened for STIs, including gonorrhea and chlamydia, if: You are sexually active and are younger than 74 years of age. You are older than 74 years of age and your health care provider tells you that you are at risk for this type of infection. Your sexual activity  has changed since you were last screened, and you are at increased risk for chlamydia or gonorrhea. Ask your health care provider if you are at risk. Ask your health care provider about whether you are at high risk for HIV. Your health care provider may recommend a prescription medicine to help prevent HIV infection. If you choose to take medicine to prevent HIV, you should first get tested for HIV. You  should then be tested every 3 months for as long as you are taking the medicine. Pregnancy If you are about to stop having your period (premenopausal) and you may become pregnant, seek counseling before you get pregnant. Take 400 to 800 micrograms (mcg) of folic acid every day if you become pregnant. Ask for birth control (contraception) if you want to prevent pregnancy. Osteoporosis and menopause Osteoporosis is a disease in which the bones lose minerals and strength with aging. This can result in bone fractures. If you are 71 years old or older, or if you are at risk for osteoporosis and fractures, ask your health care provider if you should: Be screened for bone loss. Take a calcium  or vitamin D  supplement to lower your risk of fractures. Be given hormone replacement therapy (HRT) to treat symptoms of menopause. Follow these instructions at home: Alcohol use Do not drink alcohol if: Your health care provider tells you not to drink. You are pregnant, may be pregnant, or are planning to become pregnant. If you drink alcohol: Limit how much you have to: 0-1 drink a day. Know how much alcohol is in your drink. In the [place] one drink equals one 12 oz bottle of beer (355 mL), one 5 oz glass of wine (148 mL), or one 1 oz glass of hard liquor (44 mL). Lifestyle Do not use any products that contain nicotine or tobacco. These products include cigarettes, chewing tobacco, and vaping devices, such as e-cigarettes. If you need help quitting, ask your health care provider. Do not use street drugs. Do not share needles. Ask your health care provider for help if you need support or information about quitting drugs. General instructions Schedule regular health, dental, and eye exams. Stay current with your vaccines. Tell your health care provider if: You often feel depressed. You have ever been abused or do not feel safe at home. Summary Adopting a healthy lifestyle and getting preventive care are  important in promoting health and wellness. Follow your health care provider's instructions about healthy diet, exercising, and getting tested or screened for diseases. Follow your health care provider's instructions on monitoring your cholesterol and blood pressure. This information is not intended to replace advice given to you by your health care provider. Make sure you discuss any questions you have with your health care provider. Document Revised: 03/27/2021 Document Reviewed: 03/27/2021 Elsevier Patient Education  2024 [employer].

## 2024-06-21 NOTE — Progress Notes (Unsigned)
 Subjective:    Patient ID: Kara Ramirez, female    DOB: 10-11-1951, 73 y.o.   MRN: [id number]      HPI [name] is here for a Physical exam and her chronic medical problems.    She has seen neurology for further evaluation of her difficulty walking, parkinsonism features.  Had blood work and Ct scans, MRI done last week.      Medications and allergies reviewed with patient and updated if appropriate.  Current Outpatient Medications on File Prior to Visit  Medication Sig Dispense Refill   acetaminophen  (TYLENOL ) 325 MG tablet Take 1-2 tablets (325-650 mg total) by mouth every 4 (four) hours as needed for mild pain.     albuterol  (VENTOLIN  HFA) 108 (90 Base) MCG/ACT inhaler Inhale 1-2 puffs into the lungs every 6 (six) hours as needed for wheezing or shortness of breath. 8.5 g 1   alendronate (FOSAMAX) 70 MG tablet Take by mouth.     Apoaequorin (PREVAGEN PO) Take 1 tablet by mouth daily.     aspirin-acetaminophen -caffeine (EXCEDRIN MIGRAINE) 250-250-65 MG tablet Take 1 tablet by mouth every 6 (six) hours as needed for headache.     azelastine  (ASTELIN ) 0.1 % nasal spray Place 1 spray into both nostrils 2 (two) times daily. Use in each nostril as directed 30 mL 5   bismuth subsalicylate (PEPTO BISMOL) 262 MG chewable tablet Chew 4 tablets by mouth 2 (two) times daily.     Budeson-Glycopyrrol-Formoterol  (BREZTRI  AEROSPHERE) 160-9-4.8 MCG/ACT AERO Inhale 1 puff into the lungs in the morning and at bedtime. 10.7 g 5   Calcium  Carbonate-Vitamin D  600-400 MG-UNIT tablet Take 1 tablet by mouth daily.     Carboxymethylcellul-Glycerin 1-0.9 % GEL Place 1 application into both eyes at bedtime.     Co-Enzyme Q-10 100 MG CAPS Take 100 mg by mouth daily.      divalproex  (DEPAKOTE  ER) 250 MG 24 hr tablet SMARTSIG:1 Tablet(s) By Mouth     divalproex  (DEPAKOTE  ER) 500 MG 24 hr tablet SMARTSIG:1 Tablet(s) By Mouth     Eyelid Cleansers (STERILID) FOAM Apply topically.     ezetimibe  (ZETIA ) 10 MG  tablet TAKE 1 TABLET BY MOUTH DAILY 90 tablet 3   famotidine  (PEPCID ) 20 MG tablet TAKE 1 TABLET BY MOUTH DAILY 90 tablet 1   fluocinonide ointment (LIDEX) 0.05 % Apply 1 application topically See admin instructions. Apply topically twice daily for 5 days alternating with Tacrolimus  ointment     fluticasone  (FLONASE ) 50 MCG/ACT nasal spray Place 2 sprays into both nostrils daily. 16 g 5   ipratropium (ATROVENT ) 0.03 % nasal spray Place 1 spray into both nostrils 3 (three) times daily as needed for rhinitis. 30 mL 5   loratadine  (CLARITIN ) 10 MG tablet Take 1 tablet (10 mg total) by mouth daily. 30 tablet 5   MALIC ACID PO Take 1 capsule by mouth at bedtime.     metoprolol  tartrate (LOPRESSOR ) 25 MG tablet TAKE ONE-HALF (0.5) TABLET BY MOUTH 2 TIMES A DAY 90 tablet 2   mirabegron ER (MYRBETRIQ) 50 MG TB24 tablet Take 50 mg by mouth daily as needed.     Misc. Devices (ROLLER WALKER) MISC Rolling walker with seat. Dx:G20, R26.81 1 each 0   montelukast  (SINGULAIR ) 5 MG chewable tablet Chew 2 tablets (10 mg total) by mouth at bedtime. 60 tablet 5   naproxen sodium (ALEVE) 220 MG tablet Take 220 mg by mouth daily as needed.      Olopatadine  HCl 0.2 %  SOLN Apply to eye 2 (two) times daily as needed.     Omega-3 Fatty Acids (FISH OIL) 1000 MG CAPS Take 1,000 mg by mouth at bedtime.      pravastatin  (PRAVACHOL ) 40 MG tablet Take 1 tablet (40 mg total) by mouth daily. 90 tablet 3   sodium chloride  (OCEAN) 0.65 % SOLN nasal spray Place 1 spray into both nostrils as needed for congestion. (Patient taking differently: Place 1 spray into both nostrils 4 (four) times daily as needed for congestion.)  0   tacrolimus  (PROTOPIC ) 0.1 % ointment Apply 1 application topically 2 (two) times daily as needed (PRN skin issues). (Patient taking differently: Apply 1 application  topically See admin instructions. Apply topically twice daily for 5 days alternating with Fluocinonide ointment) 100 g 0   Thiamine  HCl (VITAMIN B-1)  100 MG tablet Take 100 mg by mouth daily.     triamcinolone  ointment (KENALOG ) 0.5 % Apply 1 application topically 2 (two) times daily. For rash on left lower leg 30 g 0   No current facility-administered medications on file prior to visit.    Review of Systems  Constitutional:  Positive for appetite change (low - chronic). Negative for fever.  HENT:  Positive for postnasal drip and rhinorrhea. Negative for ear pain.        Ears feel clogged  Eyes:  Positive for visual disturbance (blurry - recent eye exam).  Respiratory:  Positive for shortness of breath (sometimes). Negative for cough and wheezing.   Cardiovascular:  Negative for chest pain, palpitations and leg swelling.  Gastrointestinal:  Positive for constipation. Negative for abdominal pain, blood in stool and diarrhea.       No gerd  Genitourinary:  Negative for dysuria.  Musculoskeletal:  Positive for arthralgias, back pain and gait problem.  Skin:  Negative for rash.  Neurological:  Positive for tremors, speech difficulty, light-headedness (intermittent) and headaches.  Psychiatric/Behavioral:  Negative for dysphoric mood. The patient is not nervous/anxious.        Objective:   Vitals:   06/22/24 1007  BP: 110/64  Pulse: 72  Temp: 97.9 F (36.6 C)  SpO2: 95%   Filed Weights   06/22/24 1007  Weight: 99 lb 6 oz (45.1 kg)   Body mass index is 17.06 kg/m.  BP Readings from Last 3 Encounters:  06/22/24 110/64  06/15/24 102/62  02/17/24 112/66    Wt Readings from Last 3 Encounters:  06/22/24 99 lb 6 oz (45.1 kg)  06/15/24 97 lb (44 kg)  02/17/24 97 lb (44 kg)       Physical Exam Constitutional: She appears well-developed and well-nourished. No distress.  HENT:  Head: Normocephalic and atraumatic.  Right Ear: External ear normal. Normal ear canal and TM Left Ear: External ear normal.  Normal ear canal and TM Mouth/Throat: Oropharynx is clear and moist.  Eyes: Conjunctivae normal.  Neck: Neck supple. No  tracheal deviation present. No thyromegaly present.  No carotid bruit  Cardiovascular: Normal rate, regular rhythm and normal heart sounds.   No murmur heard.  No edema. Pulmonary/Chest: Effort normal and breath sounds normal. No respiratory distress. She has no wheezes. She has no rales.  Breast: deferred   Abdominal: Soft. She exhibits no distension. There is no tenderness.  Lymphadenopathy: She has no cervical adenopathy.  Skin: Skin is warm and dry. She is not diaphoretic.  Psychiatric: She has a normal mood and affect. Her behavior is normal.     Lab Results  Component Value Date  WBC 5.6 12/06/2023   HGB 13.1 12/06/2023   HCT 39.8 12/06/2023   PLT 229.0 12/06/2023   GLUCOSE 63 (L) 12/06/2023   CHOL 173 06/19/2023   TRIG 80.0 06/19/2023   HDL 69.60 06/19/2023   LDLCALC 87 06/19/2023   ALT 22 12/06/2023   AST 18 12/06/2023   NA 145 12/06/2023   K 4.1 12/06/2023   CL 107 12/06/2023   CREATININE 1.05 12/06/2023   BUN 25 (H) 12/06/2023   CO2 30 12/06/2023   TSH 0.55 12/06/2023   INR 1.00 08/07/2018   HGBA1C 5.6 06/19/2023         Assessment & Plan:   Physical exam: Screening blood work  ordered Exercise  minimal Weight  low BMI - trying to increase weight Substance abuse  none   Reviewed recommended immunizations.   Health Maintenance  Topic Date Due   DTaP/Tdap/Td (1 - Tdap) Never done   DEXA SCAN  11/06/2021   MAMMOGRAM  11/08/2021   INFLUENZA VACCINE  06/19/2024   Medicare Annual Wellness (AWV)  06/27/2024   COVID-19 Vaccine (4 - 2024-25 season) 07/08/2024 (Originally 07/21/2023)   Colonoscopy  06/07/2025   Pneumococcal Vaccine: 50+ Years  Completed   Hepatitis C Screening  Completed   Zoster Vaccines- Shingrix  Completed   Hepatitis B Vaccines  Aged Out   HPV VACCINES  Aged Out   Meningococcal B Vaccine  Aged Out          See Problem List for Assessment and Plan of chronic medical problems.

## 2024-06-22 ENCOUNTER — Telehealth: Payer: Self-pay | Admitting: Radiology

## 2024-06-22 ENCOUNTER — Ambulatory Visit: Payer: Self-pay | Admitting: Internal Medicine

## 2024-06-22 ENCOUNTER — Encounter: Payer: Self-pay | Admitting: Internal Medicine

## 2024-06-22 ENCOUNTER — Ambulatory Visit (INDEPENDENT_AMBULATORY_CARE_PROVIDER_SITE_OTHER): Payer: PPO | Admitting: Internal Medicine

## 2024-06-22 VITALS — BP 110/64 | HR 72 | Temp 97.9°F | Ht 64.0 in | Wt 99.4 lb

## 2024-06-22 DIAGNOSIS — Z0000 Encounter for general adult medical examination without abnormal findings: Secondary | ICD-10-CM | POA: Diagnosis not present

## 2024-06-22 DIAGNOSIS — E782 Mixed hyperlipidemia: Secondary | ICD-10-CM | POA: Diagnosis not present

## 2024-06-22 DIAGNOSIS — I480 Paroxysmal atrial fibrillation: Secondary | ICD-10-CM | POA: Diagnosis not present

## 2024-06-22 DIAGNOSIS — M810 Age-related osteoporosis without current pathological fracture: Secondary | ICD-10-CM | POA: Diagnosis not present

## 2024-06-22 DIAGNOSIS — K219 Gastro-esophageal reflux disease without esophagitis: Secondary | ICD-10-CM | POA: Diagnosis not present

## 2024-06-22 DIAGNOSIS — K5900 Constipation, unspecified: Secondary | ICD-10-CM | POA: Diagnosis not present

## 2024-06-22 DIAGNOSIS — G20C Parkinsonism, unspecified: Secondary | ICD-10-CM

## 2024-06-22 DIAGNOSIS — E538 Deficiency of other specified B group vitamins: Secondary | ICD-10-CM | POA: Insufficient documentation

## 2024-06-22 DIAGNOSIS — R7303 Prediabetes: Secondary | ICD-10-CM | POA: Diagnosis not present

## 2024-06-22 DIAGNOSIS — R634 Abnormal weight loss: Secondary | ICD-10-CM | POA: Diagnosis not present

## 2024-06-22 LAB — CBC WITH DIFFERENTIAL/PLATELET
Basophils Absolute: 0 K/uL (ref 0.0–0.1)
Basophils Relative: 0.6 % (ref 0.0–3.0)
Eosinophils Absolute: 0.1 K/uL (ref 0.0–0.7)
Eosinophils Relative: 1.5 % (ref 0.0–5.0)
HCT: 38.6 % (ref 36.0–46.0)
Hemoglobin: 12.8 g/dL (ref 12.0–15.0)
Lymphocytes Relative: 29 % (ref 12.0–46.0)
Lymphs Abs: 1.7 K/uL (ref 0.7–4.0)
MCHC: 33.2 g/dL (ref 30.0–36.0)
MCV: 90.4 fl (ref 78.0–100.0)
Monocytes Absolute: 0.4 K/uL (ref 0.1–1.0)
Monocytes Relative: 6.3 % (ref 3.0–12.0)
Neutro Abs: 3.7 K/uL (ref 1.4–7.7)
Neutrophils Relative %: 62.6 % (ref 43.0–77.0)
Platelets: 246 K/uL (ref 150.0–400.0)
RBC: 4.27 Mil/uL (ref 3.87–5.11)
RDW: 13.2 % (ref 11.5–15.5)
WBC: 6 K/uL (ref 4.0–10.5)

## 2024-06-22 LAB — COMPREHENSIVE METABOLIC PANEL WITH GFR
ALT: 30 U/L (ref 0–35)
AST: 25 U/L (ref 0–37)
Albumin: 4.4 g/dL (ref 3.5–5.2)
Alkaline Phosphatase: 53 U/L (ref 39–117)
BUN: 18 mg/dL (ref 6–23)
CO2: 30 meq/L (ref 19–32)
Calcium: 9.9 mg/dL (ref 8.4–10.5)
Chloride: 106 meq/L (ref 96–112)
Creatinine, Ser: 0.82 mg/dL (ref 0.40–1.20)
GFR: 70.6 mL/min (ref >60.00)
Glucose, Bld: 44 mg/dL — CL (ref 70–99)
Potassium: 3.9 meq/L (ref 3.5–5.1)
Sodium: 144 meq/L (ref 135–145)
Total Bilirubin: 0.4 mg/dL (ref 0.2–1.2)
Total Protein: 7.2 g/dL (ref 6.0–8.3)

## 2024-06-22 LAB — TSH: TSH: 0.69 u[IU]/mL (ref 0.35–5.50)

## 2024-06-22 LAB — LIPID PANEL
Cholesterol: 178 mg/dL (ref 0–200)
HDL: 73.3 mg/dL (ref >39.00)
LDL Cholesterol: 89 mg/dL (ref 0–99)
NonHDL: 104.97
Total CHOL/HDL Ratio: 2
Triglycerides: 80 mg/dL (ref 0.0–149.0)
VLDL: 16 mg/dL (ref 0.0–40.0)

## 2024-06-22 LAB — HEMOGLOBIN A1C: Hgb A1c MFr Bld: 5.8 % (ref 4.6–6.5)

## 2024-06-22 LAB — VITAMIN D 25 HYDROXY (VIT D DEFICIENCY, FRACTURES): VITD: 47.74 ng/mL (ref 30.00–100.00)

## 2024-06-22 LAB — VITAMIN B12: Vitamin B-12: 277 pg/mL (ref 211–911)

## 2024-06-22 NOTE — Assessment & Plan Note (Addendum)
 Chronic Following with GI Taking miralax  daily and uses suppositories as needed

## 2024-06-22 NOTE — Telephone Encounter (Signed)
 CRITICAL VALUE STICKER   CRITICAL VALUE: Glucose 44   RECEIVER (on-site recipient of call): Jonel Weldon    DATE & TIME NOTIFIED: 06/22/24 @14 :16   MESSENGER (representative from lab):HOPE    MD NOTIFIED: yes    TIME OF NOTIFICATION: 14:16 06/22/2024   RESPONSE:

## 2024-06-22 NOTE — Assessment & Plan Note (Signed)
 Chronic Lab Results  Component Value Date   HGBA1C 5.6 06/19/2023   Check a1c Low sugar / carb diet Stressed regular exercise

## 2024-06-22 NOTE — Assessment & Plan Note (Addendum)
 Chronic Has had full evaluation with neurology-Dr. Tat Parkinson's disease, Huntington's chorea have been ruled out Diagnosed with myoclonus ataxia Has not been able to tolerate certain medications Recently saw neurology at [facility] for further evaluation Tests being done to rule out paraneoplastic syndrome, especially given weight loss-CT scan ordered Check B12 level

## 2024-06-22 NOTE — Assessment & Plan Note (Signed)
 Subacute Eating 3 meals a day and snacks Increase calorie intake Deferred nutrition referral Just had Ct Chest/Ab/Pelvis, MRI head at [facility]

## 2024-06-22 NOTE — Assessment & Plan Note (Signed)
Chronic Management per GYN On Fosamax 70 mg weekly Taking calcium and vitamin D

## 2024-06-22 NOTE — Assessment & Plan Note (Signed)
Chronic GERD controlled Continue Pepcid 20 mg daily 

## 2024-06-22 NOTE — Assessment & Plan Note (Signed)
 Following with cardiology History of atrial fibrillation On metoprolol  12.5 mg twice daily Not on anticoagulation because of previous falls, intracranial bleed CBC, CMP, TSH

## 2024-06-22 NOTE — Assessment & Plan Note (Signed)
 Chronic Regular exercise and healthy diet encouraged Check lipid panel, CMP, TSH Continue pravastatin  40 mg daily, Zetia  10 mg daily

## 2024-06-24 ENCOUNTER — Telehealth: Payer: Self-pay

## 2024-06-24 NOTE — Telephone Encounter (Signed)
 Patient called back and said she had the CT and MRI on Friday, 8-1 through [facility] and was told it could take 3 weeks.  Dr. Geofm said she would be watching for it as well.  Reminder to chart check in 2 1/2 weeks

## 2024-06-24 NOTE — Telephone Encounter (Signed)
 We have not rec'd the CT report.  Called patient to see if she had it done and where.  Asked that she make sure they send the report to Dr. Leigh at fax [phone]

## 2024-06-24 NOTE — Telephone Encounter (Signed)
-----   Message from [facility] sent at 06/15/2024  9:07 AM EDT ----- Regarding: CT scan mon Friday 8-1 Patient was going to have CT scan scheduled for 8-1 sent to Dr. Leigh for his review.  Did we receive?

## 2024-06-30 ENCOUNTER — Ambulatory Visit

## 2024-06-30 VITALS — Ht 64.0 in | Wt 99.0 lb

## 2024-06-30 DIAGNOSIS — Z0000 Encounter for general adult medical examination without abnormal findings: Secondary | ICD-10-CM

## 2024-06-30 NOTE — Patient Instructions (Signed)
 Kara Ramirez , Thank you for taking time out of your busy schedule to complete your Annual Wellness Visit with me. I enjoyed our conversation and look forward to speaking with you again next year. I, as well as your care team,  appreciate your ongoing commitment to your health goals. Please review the following plan we discussed and let me know if I can assist you in the future. Your Game plan/ To Do List    Follow up Visits: We will see or speak with you next year for your Next Medicare AWV with our clinical staff Have you seen your provider in the last 6 months (3 months if uncontrolled diabetes)? Yes.  Last OV on 06/22/2024.  Clinician Recommendations:  Aim for 30 minutes of exercise or brisk walking, 6-8 glasses of water , and 5 servings of fruits and vegetables each day. Keep up the good work.      This is a list of the screenings recommended for you:  Health Maintenance  Topic Date Due   DTaP/Tdap/Td vaccine (1 - Tdap) Never done   DEXA scan (bone density measurement)  11/06/2021   Mammogram  11/08/2021   Flu Shot  06/19/2024   COVID-19 Vaccine (4 - 2024-25 season) 07/08/2024*   Colon Cancer Screening  06/07/2025   Medicare Annual Wellness Visit  06/30/2025   Pneumococcal Vaccine for age over 35  Completed   Hepatitis C Screening  Completed   Zoster (Shingles) Vaccine  Completed   Hepatitis B Vaccine  Aged Out   HPV Vaccine  Aged Out   Meningitis B Vaccine  Aged Out  *Topic was postponed. The date shown is not the original due date.    Advanced directives: (Copy Requested) Please bring a copy of your health care power of attorney and living will to the office to be added to your chart at your convenience. You can mail to [facility] [address] [place], [place] [address] or email to [email or web address] Advance Care Planning is important because it:  [x]  Makes sure you receive the medical care that is consistent with your values, goals, and  preferences  [x]  It provides guidance to your family and loved ones and reduces their decisional burden about whether or not they are making the right decisions based on your wishes.  Follow the link provided in your after visit summary or read over the paperwork we have mailed to you to help you started getting your Advance Directives in place. If you need assistance in completing these, please reach out to us  so that we can help you!  See attachments for Preventive Care and Fall Prevention Tips.
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
## 2024-07-06 ENCOUNTER — Telehealth: Payer: Self-pay

## 2024-07-06 DIAGNOSIS — R5381 Other malaise: Secondary | ICD-10-CM

## 2024-07-06 DIAGNOSIS — R29898 Other symptoms and signs involving the musculoskeletal system: Secondary | ICD-10-CM

## 2024-07-06 NOTE — Telephone Encounter (Signed)
 Copied from CRM #[id number]. Topic: General - Other >> Jul 06, 2024 11:36 AM Revonda D wrote: Reason for CRM: Pt would like to follow up with Dr.Burns to verify if she found anything wrong with the MRI and CT she had done. Pt stated that she had the MRI and CT done at [facility] and they stated that they found nothing wrong. Pt would like a callback with an update.

## 2024-07-07 NOTE — Telephone Encounter (Signed)
 [facility]  I did review them - there were no acute concerning findings.

## 2024-07-08 NOTE — Telephone Encounter (Signed)
 Spoke with patient today and info given.

## 2024-07-09 NOTE — Telephone Encounter (Signed)
 Referral ordered

## 2024-07-09 NOTE — Addendum Note (Signed)
 Addended by: GEOFM GLADE PARAS on: 07/09/2024 12:09 PM   Modules accepted: Orders

## 2024-07-14 NOTE — Progress Notes (Unsigned)
 Follow Up Note  RE: Kara Ramirez MRN: [id number] DOB: 1951/09/04 Date of Office Visit: 07/15/2024  Referring provider: Geofm Glade PARAS, MD Primary care provider: Geofm Glade PARAS, MD  Chief Complaint: No chief complaint on file.  History of Present Illness: I had the pleasure of seeing Kara Ramirez for a follow up visit at the [facility] on 07/15/2024. She is a 73 y.o. female, who is being followed for rhinitis and asthma. Her previous allergy  office visit was on 01/02/2024 with Dr. Tobie. Today is a regular follow up visit.  Discussed the use of AI scribe software for clinical note transcription with the patient, who gave verbal consent to proceed.  History of Present Illness            Past history - 2020 skin testing positive to mold and grass.   Assessment and Plan: June is a 73 y.o. female with: Mixed Rhinitis: - Discussed this is not just allergic rhinitis as she has not responded to years of AIT in the past nor any of the nasal sprays used for allergic rhinitis treatment. Seen ENT recently and discussed considering radiofrequency ablation but questionable efficacy.  We have discussed at length that chronic rhinitis is related to overactive nerves in the nose and other than the nose spray/meds we discussed, there are no other treatments available at this time.   - SPT 07/2019: positive to grasses, mold  - Use nasal saline rinses before nose sprays such as with Neilmed Sinus Rinse.  Use distilled water .   - Use Flonase  2 sprays each nostril daily. Aim upward and outward. - Use Azelastine  1-2 sprays each nostril twice daily. Aim upward and outward. - Use Ipratropium 2 sprays up to three times daily for runny nose.  Can cause dryness.  - Use Claritin  10 mg daily.  - Use Singulair  10mg  daily.  - Avoid any nasal decongestant (like Afrin, Oxymetazoline).    Mild Persistent Asthma: - Well controlled with normal spirometry.  Daily controller medication(s):    Continue Breztri  160mcg 1 puff twice a day with spacer and rinse mouth afterwards.  Continue Singulair  10mg  daily. May use albuterol  rescue inhaler 2 puffs every 4 to 6 hours as needed for shortness of breath, chest tightness, coughing, and wheezing. May use albuterol  rescue inhaler 2 puffs 5 to 15 minutes prior to strenuous physical activities. Monitor frequency of use.  Asthma control goals:  Full participation in all desired activities (may need albuterol  before activity) Albuterol  use two times or less a week on average (not counting use with activity) Cough interfering with sleep two times or less a month Oral steroids no more than once a year No hospitalizations Assessment and Plan              No follow-ups on file.  No orders of the defined types were placed in this encounter.  Lab Orders  No laboratory test(s) ordered today    Diagnostics: Spirometry:  Tracings reviewed. Her effort: {Blank single:19197::Good reproducible efforts.,It was hard to get consistent efforts and there is a question as to whether this reflects a maximal maneuver.,Poor effort, data can not be interpreted.} FVC: ***L FEV1: ***L, ***% predicted FEV1/FVC ratio: ***% Interpretation: {Blank single:19197::Spirometry consistent with mild obstructive disease,Spirometry consistent with moderate obstructive disease,Spirometry consistent with severe obstructive disease,Spirometry consistent with possible restrictive disease,Spirometry consistent with mixed obstructive and restrictive disease,Spirometry uninterpretable due to technique,Spirometry consistent with normal pattern,No overt abnormalities noted given today's efforts}.  Please see  scanned spirometry results for details.  Skin Testing: {Blank single:19197::Select foods,Environmental allergy  panel,Environmental allergy  panel and select foods,Food allergy  panel,None,Deferred due to recent antihistamines  use}. *** Results discussed with patient/family.   Medication List:  Current Outpatient Medications  Medication Sig Dispense Refill   acetaminophen  (TYLENOL ) 325 MG tablet Take 1-2 tablets (325-650 mg total) by mouth every 4 (four) hours as needed for mild pain.     albuterol  (VENTOLIN  HFA) 108 (90 Base) MCG/ACT inhaler Inhale 1-2 puffs into the lungs every 6 (six) hours as needed for wheezing or shortness of breath. 8.5 g 1   alendronate (FOSAMAX) 70 MG tablet Take by mouth.     Apoaequorin (PREVAGEN PO) Take 1 tablet by mouth daily.     aspirin-acetaminophen -caffeine (EXCEDRIN MIGRAINE) 250-250-65 MG tablet Take 1 tablet by mouth every 6 (six) hours as needed for headache.     azelastine  (ASTELIN ) 0.1 % nasal spray Place 1 spray into both nostrils 2 (two) times daily. Use in each nostril as directed 30 mL 5   bismuth subsalicylate (PEPTO BISMOL) 262 MG chewable tablet Chew 4 tablets by mouth 2 (two) times daily.     Budeson-Glycopyrrol-Formoterol  (BREZTRI  AEROSPHERE) 160-9-4.8 MCG/ACT AERO Inhale 1 puff into the lungs in the morning and at bedtime. 10.7 g 5   Calcium  Carbonate-Vitamin D  600-400 MG-UNIT tablet Take 1 tablet by mouth daily.     Carboxymethylcellul-Glycerin 1-0.9 % GEL Place 1 application into both eyes at bedtime.     Co-Enzyme Q-10 100 MG CAPS Take 100 mg by mouth daily.      divalproex  (DEPAKOTE  ER) 250 MG 24 hr tablet SMARTSIG:1 Tablet(s) By Mouth     divalproex  (DEPAKOTE  ER) 500 MG 24 hr tablet SMARTSIG:1 Tablet(s) By Mouth     Eyelid Cleansers (STERILID) FOAM Apply topically.     ezetimibe  (ZETIA ) 10 MG tablet TAKE 1 TABLET BY MOUTH DAILY 90 tablet 3   famotidine  (PEPCID ) 20 MG tablet TAKE 1 TABLET BY MOUTH DAILY 90 tablet 1   fluocinonide ointment (LIDEX) 0.05 % Apply 1 application topically See admin instructions. Apply topically twice daily for 5 days alternating with Tacrolimus  ointment     fluticasone  (FLONASE ) 50 MCG/ACT nasal spray Place 2 sprays into both  nostrils daily. 16 g 5   ipratropium (ATROVENT ) 0.03 % nasal spray Place 1 spray into both nostrils 3 (three) times daily as needed for rhinitis. 30 mL 5   loratadine  (CLARITIN ) 10 MG tablet Take 1 tablet (10 mg total) by mouth daily. 30 tablet 5   MALIC ACID PO Take 1 capsule by mouth at bedtime.     metoprolol  tartrate (LOPRESSOR ) 25 MG tablet TAKE ONE-HALF (0.5) TABLET BY MOUTH 2 TIMES A DAY 90 tablet 2   mirabegron ER (MYRBETRIQ) 50 MG TB24 tablet Take 50 mg by mouth daily as needed.     Misc. Devices (ROLLER WALKER) MISC Rolling walker with seat. Dx:G20, R26.81 1 each 0   montelukast  (SINGULAIR ) 5 MG chewable tablet Chew 2 tablets (10 mg total) by mouth at bedtime. 60 tablet 5   naproxen sodium (ALEVE) 220 MG tablet Take 220 mg by mouth daily as needed.      Olopatadine  HCl 0.2 % SOLN Apply to eye 2 (two) times daily as needed.     Omega-3 Fatty Acids (FISH OIL) 1000 MG CAPS Take 1,000 mg by mouth at bedtime.      pravastatin  (PRAVACHOL ) 40 MG tablet Take 1 tablet (40 mg total) by mouth daily. 90 tablet 3  sodium chloride  (OCEAN) 0.65 % SOLN nasal spray Place 1 spray into both nostrils as needed for congestion. (Patient taking differently: Place 1 spray into both nostrils 4 (four) times daily as needed for congestion.)  0   tacrolimus  (PROTOPIC ) 0.1 % ointment Apply 1 application topically 2 (two) times daily as needed (PRN skin issues). (Patient taking differently: Apply 1 application  topically See admin instructions. Apply topically twice daily for 5 days alternating with Fluocinonide ointment) 100 g 0   Thiamine  HCl (VITAMIN B-1) 100 MG tablet Take 100 mg by mouth daily.     triamcinolone  ointment (KENALOG ) 0.5 % Apply 1 application topically 2 (two) times daily. For rash on left lower leg 30 g 0   No current facility-administered medications for this visit.   Allergies: Allergies  Allergen Reactions   Mold Extract [Trichophyton Mentagrophyte] Shortness Of Breath and Rash    Penicillins Shortness Of Breath, Rash and Other (See Comments)    Eyes puffy Has taken low dose pcn and no rx REACTION: rash, SOB Has patient had a PCN reaction causing immediate rash, facial/tongue/throat swelling, SOB or lightheadedness with hypotension: yes Has patient had a PCN reaction causing severe rash involving mucus membranes or skin necrosis: unk Has patient had a PCN reaction that required hospitalization: no Has patient had a PCN reaction occurring within the last 10 years: unk If all of the above answers are NO, then may proceed with Cephalospor   Morphine Other (See Comments)    REACTION: tachycardia and anxiety   Peanut Oil Nausea And Vomiting    Peanut butter   Protonix  [Pantoprazole  Sodium] Nausea And Vomiting   Atorvastatin      MYAGLIAS   Citrus Other (See Comments)    Unknown   Peanut-Containing Drug Products Other (See Comments)    Unknown   Rosuvastatin      MYALGIAS   Tramadol  Other (See Comments)    Makes crazy;confused   Valium [Diazepam] Other (See Comments)    Confusion per family   Cetirizine Rash and Other (See Comments)    Around face   Codeine Other (See Comments)    REACTION: dizzy and groggy in my head   Egg-Derived Products Nausea And Vomiting   Latex Itching   Pentazocine Lactate Palpitations   I reviewed her past medical history, social history, family history, and environmental history and no significant changes have been reported from her previous visit.  Review of Systems  Constitutional:  Negative for appetite change, chills, fever and unexpected weight change.  HENT:  Negative for congestion and rhinorrhea.   Eyes:  Negative for itching.  Respiratory:  Negative for cough, chest tightness, shortness of breath and wheezing.   Cardiovascular:  Negative for chest pain.  Gastrointestinal:  Negative for abdominal pain.  Genitourinary:  Negative for difficulty urinating.  Skin:  Negative for rash.  Neurological:  Negative for  headaches.    Objective: There were no vitals taken for this visit. There is no height or weight on file to calculate BMI. Physical Exam Vitals and nursing note reviewed.  Constitutional:      Appearance: Normal appearance. She is well-developed.  HENT:     Head: Normocephalic and atraumatic.     Right Ear: Tympanic membrane and external ear normal.     Left Ear: Tympanic membrane and external ear normal.     Nose: Nose normal.     Mouth/Throat:     Mouth: Mucous membranes are moist.     Pharynx: Oropharynx is clear.  Eyes:     Conjunctiva/sclera: Conjunctivae normal.  Cardiovascular:     Rate and Rhythm: Normal rate and regular rhythm.     Heart sounds: Normal heart sounds. No murmur heard.    No friction rub. No gallop.  Pulmonary:     Effort: Pulmonary effort is normal.     Breath sounds: Normal breath sounds. No wheezing, rhonchi or rales.  Musculoskeletal:     Cervical back: Neck supple.  Skin:    General: Skin is warm.     Findings: No rash.  Neurological:     Mental Status: She is alert and oriented to person, place, and time.  Psychiatric:        Behavior: Behavior normal.    Previous notes and tests were reviewed. The plan was reviewed with the patient/family, and all questions/concerned were addressed.  It was my pleasure to see Kara Ramirez today and participate in her care. Please feel free to contact me with any questions or concerns.  Sincerely,  Orlan Cramp, DO Allergy  & Immunology  [facility]  [place] office: [phone] [facility] office: [phone]

## 2024-07-15 ENCOUNTER — Ambulatory Visit (INDEPENDENT_AMBULATORY_CARE_PROVIDER_SITE_OTHER): Payer: PPO | Admitting: Allergy

## 2024-07-15 ENCOUNTER — Encounter: Payer: Self-pay | Admitting: Allergy

## 2024-07-15 VITALS — BP 112/78 | HR 89 | Ht 64.0 in | Wt 100.0 lb

## 2024-07-15 DIAGNOSIS — J310 Chronic rhinitis: Secondary | ICD-10-CM | POA: Diagnosis not present

## 2024-07-15 DIAGNOSIS — J4530 Mild persistent asthma, uncomplicated: Secondary | ICD-10-CM

## 2024-07-15 DIAGNOSIS — J328 Other chronic sinusitis: Secondary | ICD-10-CM | POA: Diagnosis not present

## 2024-07-15 MED ORDER — MONTELUKAST SODIUM 10 MG PO TABS: 10.0000 mg | ORAL_TABLET | Freq: Every day | ORAL | 5 refills | Status: AC

## 2024-07-15 MED ORDER — BREZTRI AEROSPHERE 160-9-4.8 MCG/ACT IN AERO: 1.0000 | INHALATION_SPRAY | Freq: Two times a day (BID) | RESPIRATORY_TRACT | 5 refills | Status: AC

## 2024-07-15 MED ORDER — XHANCE 93 MCG/ACT NA EXHU: INHALANT_SUSPENSION | NASAL | 5 refills | Status: AC

## 2024-07-15 MED ORDER — ALBUTEROL SULFATE HFA 108 (90 BASE) MCG/ACT IN AERS: 1.0000 | INHALATION_SPRAY | Freq: Four times a day (QID) | RESPIRATORY_TRACT | 1 refills | Status: AC | PRN

## 2024-07-15 MED ORDER — DESLORATADINE 5 MG PO TABS: 5.0000 mg | ORAL_TABLET | Freq: Every day | ORAL | 5 refills | Status: AC

## 2024-07-15 MED ORDER — IPRATROPIUM BROMIDE 0.03 % NA SOLN: 1.0000 | Freq: Two times a day (BID) | NASAL | 5 refills | Status: AC | PRN

## 2024-07-15 NOTE — Patient Instructions (Addendum)
 BRING ALL YOUR MEDICATIONS, NASAL SPRAYS AND INHALERS AT YOUR NEXT VISIT.   Rhinitis Take Clarinex  5mg  once a day  This replaces Claritin  Start Singulair  (montelukast ) 10mg  daily at night. Cautioned that in some children/adults can experience behavioral changes including hyperactivity, agitation, depression, sleep disturbances and suicidal ideations. These side effects are rare, but if you notice them you should notify me and discontinue Singulair  (montelukast ). Start Xhance  (fluticasone ) nasal spray 1-2 sprays per nostril twice a day as needed for nasal congestion.  Sample given and demonstrated proper use. If this is not covered let us  know.  This will be mailed to you from [facility] pharmacy [phone]) Use Atrovent  (ipratropium) 0.03% 1-2 sprays per nostril twice a day as needed for runny nose/drainage. Nasal saline spray (i.e., Simply Saline) is recommended as needed  STOP all other nasal sprays.  Asthma Daily controller medication(s): Breztri  1 puff twice a day with spacer and rinse mouth afterwards. May use albuterol  rescue inhaler 1-2 puffs every 6 hours as needed for shortness of breath, chest tightness, coughing, and wheezing. Monitor frequency of use - if you need to use it more than twice per week on a consistent basis let us  know.  Breathing control goals:  Full participation in all desired activities (may need albuterol  before activity) Albuterol  use two times or less a week on average (not counting use with activity) Cough interfering with sleep two times or less a month Oral steroids no more than once a year No hospitalizations   Follow up in 6 months or sooner if needed.

## 2024-08-05 ENCOUNTER — Telehealth: Payer: Self-pay | Admitting: *Deleted

## 2024-08-05 NOTE — Telephone Encounter (Signed)
[employer] has not yet replied to your PA request. You may close this dialog, return to your dashboard, and perform other tasks.  To check for an update later, open this request again from your dashboard.  If [employer] has not replied to your request within 24 hours for expedited requests and 72 hours for standard requests please contact [employer] at [phone] .

## 2024-08-05 NOTE — Telephone Encounter (Signed)
 Approved today by [employer] 2017 17-SEP-25:31-DEC-25 Xhance  93MCG/ACT NA EXHU Quantity:16;

## 2024-08-05 NOTE — Telephone Encounter (Signed)
(  Key: [id number])  Your information has been sent to [employer] for Xhance  PA.

## 2024-08-06 ENCOUNTER — Telehealth: Payer: Self-pay

## 2024-08-06 NOTE — Telephone Encounter (Signed)
 Jan can you let her know I looked at her CT results - CAP - overall she does not have any evidence of cancer or concerning problem like that which is good news. I don't see any concerning findings there to cause her weight loss. Is she eating okay? She needs to make sure she is eating well and consuming enough calories to prevent weight loss. If she is not eating well, or problems eating, we can consider EGD to further evaluate. She had an exam I think several years ago. Her stomach looked okay on the CT, but that would be something else to consider if she continues to lose weight. Can you let me know what she thinks? Thanks

## 2024-08-06 NOTE — Telephone Encounter (Signed)
 Patient called to advise Dr. Leigh that the MRI and CT she had on August 1st with [facility] have resulted.  Neurology told her they didn't find anything of concern. She would appreciate it if you would review and give her your impressions. She would appreciate a call if possible. Thank you

## 2024-08-07 NOTE — Telephone Encounter (Signed)
 Left message advising of Dr Hassan latest recommendations for meal supplementation using ensure or protein shakes, considering endoscopy if she continues to lose weight and recommendation for follow up office visit in a few months.

## 2024-08-07 NOTE — Telephone Encounter (Signed)
 Thanks Sonny. Ct looks good, her labs are normal. Albumin is normal, she is not malnourished. Glad she is eating okay. Unclear what is driving her weight loss. If weight is stable that is good, she needs to continue to supplement her meals with ensure / protein shakes and increase caloric intake if possible. We could consider an egd to further evaluate if her weight loss continues but may be low yield, up to her how aggressive she wishes to be with further evaluation of it if weight has stabilized. I would keep an eye on this and I can see her back in the office in a few months. If she has issues in the interim or worsening can do egd. Thanks

## 2024-08-07 NOTE — Telephone Encounter (Signed)
 Lm on vm

## 2024-08-07 NOTE — Telephone Encounter (Signed)
 Spoke with patient and her husband and relayed Dr. Hassan comments regarding her CT.  They both stated that her appetite is good and that she is eating three meals a day plus a snack.  She's not sure if she's lost any more weight but states she has not gained any.  Please advise.

## 2024-08-10 ENCOUNTER — Ambulatory Visit: Attending: Internal Medicine

## 2024-08-10 DIAGNOSIS — R5381 Other malaise: Secondary | ICD-10-CM | POA: Insufficient documentation

## 2024-08-10 DIAGNOSIS — R2689 Other abnormalities of gait and mobility: Secondary | ICD-10-CM | POA: Insufficient documentation

## 2024-08-10 DIAGNOSIS — R29898 Other symptoms and signs involving the musculoskeletal system: Secondary | ICD-10-CM | POA: Diagnosis not present

## 2024-08-10 DIAGNOSIS — M6281 Muscle weakness (generalized): Secondary | ICD-10-CM | POA: Diagnosis not present
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
## 2024-08-11 ENCOUNTER — Other Ambulatory Visit: Payer: Self-pay | Admitting: Internal Medicine

## 2024-08-11 DIAGNOSIS — R29898 Other symptoms and signs involving the musculoskeletal system: Secondary | ICD-10-CM

## 2024-08-11 DIAGNOSIS — R479 Unspecified speech disturbances: Secondary | ICD-10-CM

## 2024-08-11 DIAGNOSIS — R251 Tremor, unspecified: Secondary | ICD-10-CM

## 2024-08-11 DIAGNOSIS — R4189 Other symptoms and signs involving cognitive functions and awareness: Secondary | ICD-10-CM

## 2024-08-17 ENCOUNTER — Ambulatory Visit

## 2024-08-17 DIAGNOSIS — M6281 Muscle weakness (generalized): Secondary | ICD-10-CM

## 2024-08-17 DIAGNOSIS — R2689 Other abnormalities of gait and mobility: Secondary | ICD-10-CM
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
## 2024-08-19 ENCOUNTER — Ambulatory Visit: Attending: Internal Medicine

## 2024-08-19 DIAGNOSIS — R2689 Other abnormalities of gait and mobility: Secondary | ICD-10-CM | POA: Diagnosis not present

## 2024-08-19 DIAGNOSIS — R2681 Unsteadiness on feet: Secondary | ICD-10-CM | POA: Diagnosis not present

## 2024-08-19 DIAGNOSIS — R41841 Cognitive communication deficit: Secondary | ICD-10-CM | POA: Diagnosis not present

## 2024-08-19 DIAGNOSIS — R471 Dysarthria and anarthria: Secondary | ICD-10-CM | POA: Diagnosis not present

## 2024-08-19 DIAGNOSIS — R29818 Other symptoms and signs involving the nervous system: Secondary | ICD-10-CM | POA: Insufficient documentation

## 2024-08-19 DIAGNOSIS — R278 Other lack of coordination: Secondary | ICD-10-CM | POA: Insufficient documentation

## 2024-08-19 DIAGNOSIS — R293 Abnormal posture: Secondary | ICD-10-CM | POA: Insufficient documentation

## 2024-08-19 DIAGNOSIS — M6281 Muscle weakness (generalized): Secondary | ICD-10-CM | POA: Insufficient documentation

## 2024-08-19 DIAGNOSIS — R270 Ataxia, unspecified: Secondary | ICD-10-CM | POA: Insufficient documentation

## 2024-08-19 DIAGNOSIS — R251 Tremor, unspecified: Secondary | ICD-10-CM | POA: Diagnosis not present
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
## 2024-08-24 ENCOUNTER — Ambulatory Visit

## 2024-08-24 DIAGNOSIS — M6281 Muscle weakness (generalized): Secondary | ICD-10-CM

## 2024-08-24 DIAGNOSIS — R2681 Unsteadiness on feet: Secondary | ICD-10-CM
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
## 2024-08-26 ENCOUNTER — Ambulatory Visit

## 2024-08-26 DIAGNOSIS — M6281 Muscle weakness (generalized): Secondary | ICD-10-CM | POA: Diagnosis not present

## 2024-08-26 DIAGNOSIS — R2681 Unsteadiness on feet: Secondary | ICD-10-CM
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
## 2024-08-31 ENCOUNTER — Encounter: Payer: Self-pay | Admitting: Speech Pathology

## 2024-08-31 ENCOUNTER — Ambulatory Visit

## 2024-08-31 ENCOUNTER — Ambulatory Visit: Admitting: Speech Pathology

## 2024-08-31 DIAGNOSIS — M6281 Muscle weakness (generalized): Secondary | ICD-10-CM | POA: Diagnosis not present

## 2024-08-31 DIAGNOSIS — R41841 Cognitive communication deficit: Secondary | ICD-10-CM

## 2024-08-31 DIAGNOSIS — R471 Dysarthria and anarthria: Secondary | ICD-10-CM

## 2024-08-31 DIAGNOSIS — R2689 Other abnormalities of gait and mobility: Secondary | ICD-10-CM

## 2024-08-31 DIAGNOSIS — R2681 Unsteadiness on feet: Secondary | ICD-10-CM
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
## 2024-09-02 ENCOUNTER — Ambulatory Visit

## 2024-09-02 DIAGNOSIS — M6281 Muscle weakness (generalized): Secondary | ICD-10-CM | POA: Diagnosis not present

## 2024-09-02 DIAGNOSIS — R471 Dysarthria and anarthria: Secondary | ICD-10-CM

## 2024-09-02 DIAGNOSIS — R2681 Unsteadiness on feet: Secondary | ICD-10-CM
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
## 2024-09-05 ENCOUNTER — Other Ambulatory Visit: Payer: Self-pay | Admitting: Internal Medicine

## 2024-09-07 ENCOUNTER — Ambulatory Visit: Admitting: Occupational Therapy

## 2024-09-07 ENCOUNTER — Ambulatory Visit

## 2024-09-07 DIAGNOSIS — R29818 Other symptoms and signs involving the nervous system: Secondary | ICD-10-CM

## 2024-09-07 DIAGNOSIS — M6281 Muscle weakness (generalized): Secondary | ICD-10-CM | POA: Diagnosis not present

## 2024-09-07 DIAGNOSIS — R270 Ataxia, unspecified: Secondary | ICD-10-CM

## 2024-09-07 DIAGNOSIS — R293 Abnormal posture: Secondary | ICD-10-CM

## 2024-09-07 DIAGNOSIS — R41841 Cognitive communication deficit: Secondary | ICD-10-CM

## 2024-09-07 DIAGNOSIS — R251 Tremor, unspecified: Secondary | ICD-10-CM

## 2024-09-07 DIAGNOSIS — R2681 Unsteadiness on feet: Secondary | ICD-10-CM

## 2024-09-07 DIAGNOSIS — R278 Other lack of coordination: Secondary | ICD-10-CM

## 2024-09-07 DIAGNOSIS — R2689 Other abnormalities of gait and mobility: Secondary | ICD-10-CM
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
## 2024-09-09 DIAGNOSIS — R270 Ataxia, unspecified: Secondary | ICD-10-CM | POA: Diagnosis not present

## 2024-09-10 ENCOUNTER — Ambulatory Visit

## 2024-09-10 DIAGNOSIS — R2681 Unsteadiness on feet: Secondary | ICD-10-CM

## 2024-09-10 DIAGNOSIS — M6281 Muscle weakness (generalized): Secondary | ICD-10-CM
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
## 2024-09-16 ENCOUNTER — Ambulatory Visit: Admitting: Occupational Therapy

## 2024-09-16 ENCOUNTER — Ambulatory Visit: Admitting: Speech Pathology

## 2024-09-16 DIAGNOSIS — R41841 Cognitive communication deficit: Secondary | ICD-10-CM

## 2024-09-16 DIAGNOSIS — R278 Other lack of coordination: Secondary | ICD-10-CM

## 2024-09-16 DIAGNOSIS — R270 Ataxia, unspecified: Secondary | ICD-10-CM

## 2024-09-16 DIAGNOSIS — R471 Dysarthria and anarthria: Secondary | ICD-10-CM

## 2024-09-16 DIAGNOSIS — M6281 Muscle weakness (generalized): Secondary | ICD-10-CM

## 2024-09-16 DIAGNOSIS — R251 Tremor, unspecified: Secondary | ICD-10-CM
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
## 2024-09-16 NOTE — Patient Instructions (Signed)
 [email or web address]  OR  [employer] --- scroll halfway down -- click home practice --- select a lesson  OR   [employer] Home practice   Find your book!  Write down husbands email if you cannot find the online lessons and I will email a link:
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
## 2024-09-21 ENCOUNTER — Ambulatory Visit: Attending: Internal Medicine | Admitting: Occupational Therapy

## 2024-09-21 ENCOUNTER — Encounter: Payer: Self-pay | Admitting: Occupational Therapy

## 2024-09-21 DIAGNOSIS — R278 Other lack of coordination: Secondary | ICD-10-CM | POA: Insufficient documentation

## 2024-09-21 DIAGNOSIS — R270 Ataxia, unspecified: Secondary | ICD-10-CM | POA: Insufficient documentation

## 2024-09-21 DIAGNOSIS — R471 Dysarthria and anarthria: Secondary | ICD-10-CM | POA: Insufficient documentation

## 2024-09-21 DIAGNOSIS — R293 Abnormal posture: Secondary | ICD-10-CM | POA: Insufficient documentation

## 2024-09-21 DIAGNOSIS — M6281 Muscle weakness (generalized): Secondary | ICD-10-CM | POA: Insufficient documentation

## 2024-09-21 DIAGNOSIS — R41841 Cognitive communication deficit: Secondary | ICD-10-CM | POA: Insufficient documentation

## 2024-09-21 DIAGNOSIS — R2681 Unsteadiness on feet: Secondary | ICD-10-CM | POA: Insufficient documentation

## 2024-09-21 DIAGNOSIS — R251 Tremor, unspecified: Secondary | ICD-10-CM | POA: Insufficient documentation

## 2024-09-21 DIAGNOSIS — R29818 Other symptoms and signs involving the nervous system: Secondary | ICD-10-CM | POA: Insufficient documentation
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
## 2024-09-21 NOTE — Patient Instructions (Signed)
 Compensation Strategies for Tremors  When eating, try the following  Scoot as close to table as possible Eat out of bowls, divided plates, or use a plate guard (available at a medical supply store) and eat with a spoon so that you have an edge to scoop up food. Try raising your plate/bowl so that there is less distance between the plate and mouth. Try stabilizing elbows on the tables or against your body. Use utensil with built-up/larger grips as they are easier to hold.  When writing, try the following: Stabilize forearm on the table. Take your time as rushing/being stressed can increase tremors. Try a felt-tipped pen, it does not glide as much.  Avoid gel pens ( they move to much ). Trace over letters or copy name  Consider using pre-printed labels with your name and address (carry them with you when you go out) or you can get stamps with your address or signature on it. Use a small tape recorder to record messages/reminders for yourself. Use pens with bigger grips.  When brushing your teeth, putting on make-up, or styling hair, try the following: Use an electric toothbrush. Use items with built-up grips. Stabilize your elbows against your body or on the counter. Use long-handled brushes/combs. Use a hair dryer with a stand.  In general: Avoid stress, fatigue or rushing as this can increase tremors. Sit down for activities that require more control/coordination. Perform flicks.      SHOULDER: Flexion - Sitting    SEATED: Hold cane with both hands. Raise arms up, keeping stick/cane level. Keep elbows straight.  Repeat 10 reps per set, _2__ sets per day  2. SEATED: Tap foot to chair in front of you alternating feet. Repeat 10 times each foot, 2 sets per day  3. Flip large/jumbo (4 x 6) cards over - with Rt hand flip to Rt side, with Lt hand flip to Lt side.   4. Trace over capital letters, and/or first name (print only)  with high lighter or Sharpie if able

## 2024-09-30 ENCOUNTER — Ambulatory Visit: Admitting: Speech Pathology

## 2024-09-30 ENCOUNTER — Encounter: Payer: Self-pay | Admitting: Occupational Therapy

## 2024-09-30 ENCOUNTER — Ambulatory Visit: Admitting: Occupational Therapy

## 2024-09-30 DIAGNOSIS — R29818 Other symptoms and signs involving the nervous system: Secondary | ICD-10-CM

## 2024-09-30 DIAGNOSIS — R251 Tremor, unspecified: Secondary | ICD-10-CM

## 2024-09-30 DIAGNOSIS — R278 Other lack of coordination: Secondary | ICD-10-CM

## 2024-09-30 DIAGNOSIS — M6281 Muscle weakness (generalized): Secondary | ICD-10-CM

## 2024-09-30 DIAGNOSIS — R2681 Unsteadiness on feet: Secondary | ICD-10-CM

## 2024-09-30 DIAGNOSIS — R270 Ataxia, unspecified: Secondary | ICD-10-CM
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
## 2024-10-07 ENCOUNTER — Ambulatory Visit: Admitting: Speech Pathology

## 2024-10-07 ENCOUNTER — Encounter: Payer: Self-pay | Admitting: Occupational Therapy

## 2024-10-07 ENCOUNTER — Encounter: Payer: Self-pay | Admitting: Speech Pathology

## 2024-10-07 ENCOUNTER — Ambulatory Visit: Admitting: Occupational Therapy

## 2024-10-07 DIAGNOSIS — R29818 Other symptoms and signs involving the nervous system: Secondary | ICD-10-CM

## 2024-10-07 DIAGNOSIS — R471 Dysarthria and anarthria: Secondary | ICD-10-CM

## 2024-10-07 DIAGNOSIS — R251 Tremor, unspecified: Secondary | ICD-10-CM

## 2024-10-07 DIAGNOSIS — R278 Other lack of coordination: Secondary | ICD-10-CM

## 2024-10-07 DIAGNOSIS — R270 Ataxia, unspecified: Secondary | ICD-10-CM

## 2024-10-07 DIAGNOSIS — R2681 Unsteadiness on feet: Secondary | ICD-10-CM
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
## 2024-10-14 ENCOUNTER — Ambulatory Visit: Admitting: Occupational Therapy

## 2024-10-14 ENCOUNTER — Ambulatory Visit: Admitting: Speech Pathology

## 2024-10-14 DIAGNOSIS — R29818 Other symptoms and signs involving the nervous system: Secondary | ICD-10-CM

## 2024-10-14 DIAGNOSIS — R41841 Cognitive communication deficit: Secondary | ICD-10-CM

## 2024-10-14 DIAGNOSIS — R278 Other lack of coordination: Secondary | ICD-10-CM

## 2024-10-14 DIAGNOSIS — R251 Tremor, unspecified: Secondary | ICD-10-CM

## 2024-10-14 DIAGNOSIS — R471 Dysarthria and anarthria: Secondary | ICD-10-CM

## 2024-10-14 DIAGNOSIS — R270 Ataxia, unspecified: Secondary | ICD-10-CM

## 2024-10-14 DIAGNOSIS — M6281 Muscle weakness (generalized): Secondary | ICD-10-CM
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
## 2024-10-20 ENCOUNTER — Ambulatory Visit: Admitting: Occupational Therapy

## 2024-10-20 ENCOUNTER — Ambulatory Visit

## 2024-10-20 DIAGNOSIS — R293 Abnormal posture: Secondary | ICD-10-CM | POA: Insufficient documentation

## 2024-10-20 DIAGNOSIS — R41841 Cognitive communication deficit: Secondary | ICD-10-CM | POA: Insufficient documentation

## 2024-10-20 DIAGNOSIS — R29818 Other symptoms and signs involving the nervous system: Secondary | ICD-10-CM | POA: Insufficient documentation

## 2024-10-20 DIAGNOSIS — M6281 Muscle weakness (generalized): Secondary | ICD-10-CM | POA: Insufficient documentation

## 2024-10-20 DIAGNOSIS — R471 Dysarthria and anarthria: Secondary | ICD-10-CM | POA: Diagnosis present

## 2024-10-20 DIAGNOSIS — R2681 Unsteadiness on feet: Secondary | ICD-10-CM | POA: Insufficient documentation

## 2024-10-20 DIAGNOSIS — R270 Ataxia, unspecified: Secondary | ICD-10-CM | POA: Insufficient documentation

## 2024-10-20 DIAGNOSIS — R278 Other lack of coordination: Secondary | ICD-10-CM | POA: Insufficient documentation

## 2024-10-20 DIAGNOSIS — R251 Tremor, unspecified: Secondary | ICD-10-CM | POA: Insufficient documentation
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
## 2024-10-20 NOTE — Patient Instructions (Addendum)
 Access Code: [email or web address] URL: [email or web address] Date: 10/20/2024 Prepared by: Clarita Pride  Exercises - Foam block Squeezes  - 1-2 x daily - 10 reps - 3-Point Pinch with Foam  - 1-2 x daily - 10 reps - Tip PUSH with Foam  - 1-2 x daily - 10 reps - Key Pinch with Foam  - 1-2 x daily - 10 reps

## 2024-10-21 ENCOUNTER — Ambulatory Visit: Admitting: Speech Pathology

## 2024-10-28 ENCOUNTER — Ambulatory Visit: Admitting: Speech Pathology

## 2024-11-02 ENCOUNTER — Ambulatory Visit

## 2024-11-02 ENCOUNTER — Ambulatory Visit: Admitting: Occupational Therapy

## 2024-11-04 ENCOUNTER — Encounter: Payer: Self-pay | Admitting: Occupational Therapy

## 2024-11-04 ENCOUNTER — Ambulatory Visit: Admitting: Occupational Therapy

## 2024-11-04 ENCOUNTER — Ambulatory Visit: Admitting: Speech Pathology

## 2024-11-04 DIAGNOSIS — R293 Abnormal posture: Secondary | ICD-10-CM

## 2024-11-04 DIAGNOSIS — R29818 Other symptoms and signs involving the nervous system: Secondary | ICD-10-CM

## 2024-11-04 DIAGNOSIS — R2681 Unsteadiness on feet: Secondary | ICD-10-CM

## 2024-11-04 DIAGNOSIS — R278 Other lack of coordination: Secondary | ICD-10-CM

## 2024-11-04 DIAGNOSIS — R270 Ataxia, unspecified: Secondary | ICD-10-CM

## 2024-11-04 DIAGNOSIS — R251 Tremor, unspecified: Secondary | ICD-10-CM

## 2024-11-04 DIAGNOSIS — M6281 Muscle weakness (generalized): Secondary | ICD-10-CM
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
## 2024-11-07 ENCOUNTER — Other Ambulatory Visit: Payer: Self-pay | Admitting: Cardiovascular Disease

## 2024-11-16 ENCOUNTER — Ambulatory Visit: Admitting: Occupational Therapy

## 2024-11-23 ENCOUNTER — Ambulatory Visit: Admitting: Occupational Therapy

## 2024-11-25 ENCOUNTER — Other Ambulatory Visit: Payer: Self-pay | Admitting: Cardiovascular Disease

## 2024-11-30 ENCOUNTER — Ambulatory Visit: Attending: Internal Medicine | Admitting: Speech Pathology

## 2024-11-30 ENCOUNTER — Encounter: Payer: Self-pay | Admitting: Speech Pathology

## 2024-11-30 DIAGNOSIS — R471 Dysarthria and anarthria: Secondary | ICD-10-CM | POA: Diagnosis present

## 2024-11-30 DIAGNOSIS — R41841 Cognitive communication deficit: Secondary | ICD-10-CM | POA: Insufficient documentation
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
## 2024-12-07 ENCOUNTER — Ambulatory Visit: Admitting: Speech Pathology

## 2024-12-07 ENCOUNTER — Encounter: Payer: Self-pay | Admitting: Speech Pathology

## 2024-12-07 ENCOUNTER — Other Ambulatory Visit: Payer: Self-pay | Admitting: Cardiovascular Disease

## 2024-12-07 DIAGNOSIS — R41841 Cognitive communication deficit: Secondary | ICD-10-CM

## 2024-12-07 DIAGNOSIS — R471 Dysarthria and anarthria: Secondary | ICD-10-CM

## 2024-12-07 NOTE — Patient Instructions (Signed)
" ° °  Do an exercise lesson a day 5/7  You can repeat the same lessons  Be as loud as you can  Remember, these exercises target your speech as well as your swallowing - they are the same muscles so its very important to keep them strong  Make sure you read the directions to get in the correct number of times you are supposed to do each exercise "
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
## 2024-12-18 ENCOUNTER — Other Ambulatory Visit: Payer: Self-pay | Admitting: Cardiovascular Disease

## 2024-12-19 ENCOUNTER — Other Ambulatory Visit: Payer: Self-pay | Admitting: Cardiovascular Disease

## 2025-01-13 ENCOUNTER — Ambulatory Visit: Admitting: Allergy
